# Patient Record
Sex: Female | Born: 1950 | Race: White | Hispanic: No | Marital: Married | Smoking: Current every day smoker
Health system: Southern US, Community
[De-identification: ages and names within clinical notes are randomized; demographics above are authoritative.]

## PROBLEM LIST (undated history)

## (undated) DIAGNOSIS — J449 Chronic obstructive pulmonary disease, unspecified: Secondary | ICD-10-CM

## (undated) DIAGNOSIS — K529 Noninfective gastroenteritis and colitis, unspecified: Secondary | ICD-10-CM

## (undated) DIAGNOSIS — K649 Unspecified hemorrhoids: Secondary | ICD-10-CM

## (undated) DIAGNOSIS — F319 Bipolar disorder, unspecified: Secondary | ICD-10-CM

## (undated) DIAGNOSIS — K219 Gastro-esophageal reflux disease without esophagitis: Secondary | ICD-10-CM

## (undated) DIAGNOSIS — F419 Anxiety disorder, unspecified: Secondary | ICD-10-CM

## (undated) DIAGNOSIS — R569 Unspecified convulsions: Secondary | ICD-10-CM

## (undated) DIAGNOSIS — I639 Cerebral infarction, unspecified: Secondary | ICD-10-CM

## (undated) DIAGNOSIS — K259 Gastric ulcer, unspecified as acute or chronic, without hemorrhage or perforation: Secondary | ICD-10-CM

## (undated) DIAGNOSIS — K589 Irritable bowel syndrome without diarrhea: Secondary | ICD-10-CM

## (undated) DIAGNOSIS — F1011 Alcohol abuse, in remission: Secondary | ICD-10-CM

## (undated) DIAGNOSIS — M1990 Unspecified osteoarthritis, unspecified site: Secondary | ICD-10-CM

## (undated) HISTORY — PX: EYE SURGERY: SHX253

## (undated) HISTORY — PX: CHOLECYSTECTOMY: SHX55

## (undated) HISTORY — PX: ABDOMINAL HYSTERECTOMY: SHX81

---

## 2001-08-23 ENCOUNTER — Encounter: Payer: Self-pay | Admitting: Emergency Medicine

## 2001-08-23 ENCOUNTER — Emergency Department (HOSPITAL_COMMUNITY): Admission: EM | Admit: 2001-08-23 | Discharge: 2001-08-23 | Payer: Self-pay | Admitting: Emergency Medicine

## 2001-09-07 ENCOUNTER — Encounter: Payer: Self-pay | Admitting: *Deleted

## 2001-09-07 ENCOUNTER — Emergency Department (HOSPITAL_COMMUNITY): Admission: EM | Admit: 2001-09-07 | Discharge: 2001-09-07 | Payer: Self-pay | Admitting: *Deleted

## 2002-05-15 ENCOUNTER — Encounter: Payer: Self-pay | Admitting: Internal Medicine

## 2002-05-15 ENCOUNTER — Emergency Department (HOSPITAL_COMMUNITY): Admission: EM | Admit: 2002-05-15 | Discharge: 2002-05-15 | Payer: Self-pay | Admitting: Internal Medicine

## 2002-06-12 ENCOUNTER — Ambulatory Visit (HOSPITAL_COMMUNITY): Admission: RE | Admit: 2002-06-12 | Discharge: 2002-06-12 | Payer: Self-pay | Admitting: Internal Medicine

## 2002-06-12 ENCOUNTER — Encounter: Payer: Self-pay | Admitting: Internal Medicine

## 2003-03-17 ENCOUNTER — Encounter: Payer: Self-pay | Admitting: Emergency Medicine

## 2003-03-17 ENCOUNTER — Emergency Department (HOSPITAL_COMMUNITY): Admission: EM | Admit: 2003-03-17 | Discharge: 2003-03-17 | Payer: Self-pay | Admitting: Emergency Medicine

## 2003-06-03 ENCOUNTER — Emergency Department (HOSPITAL_COMMUNITY): Admission: EM | Admit: 2003-06-03 | Discharge: 2003-06-03 | Payer: Self-pay | Admitting: Emergency Medicine

## 2003-06-03 ENCOUNTER — Encounter: Payer: Self-pay | Admitting: Emergency Medicine

## 2003-06-28 ENCOUNTER — Emergency Department (HOSPITAL_COMMUNITY): Admission: EM | Admit: 2003-06-28 | Discharge: 2003-06-28 | Payer: Self-pay | Admitting: Emergency Medicine

## 2003-06-28 ENCOUNTER — Encounter: Payer: Self-pay | Admitting: Emergency Medicine

## 2004-09-03 ENCOUNTER — Emergency Department (HOSPITAL_COMMUNITY): Admission: EM | Admit: 2004-09-03 | Discharge: 2004-09-03 | Payer: Self-pay | Admitting: Emergency Medicine

## 2005-11-07 ENCOUNTER — Emergency Department (HOSPITAL_COMMUNITY): Admission: EM | Admit: 2005-11-07 | Discharge: 2005-11-07 | Payer: Self-pay | Admitting: Emergency Medicine

## 2006-01-12 ENCOUNTER — Emergency Department (HOSPITAL_COMMUNITY): Admission: EM | Admit: 2006-01-12 | Discharge: 2006-01-12 | Payer: Self-pay | Admitting: Emergency Medicine

## 2006-01-14 ENCOUNTER — Emergency Department (HOSPITAL_COMMUNITY): Admission: EM | Admit: 2006-01-14 | Discharge: 2006-01-14 | Payer: Self-pay | Admitting: Emergency Medicine

## 2007-08-20 ENCOUNTER — Emergency Department (HOSPITAL_COMMUNITY): Admission: EM | Admit: 2007-08-20 | Discharge: 2007-08-20 | Payer: Self-pay | Admitting: Emergency Medicine

## 2008-01-11 ENCOUNTER — Emergency Department (HOSPITAL_COMMUNITY): Admission: EM | Admit: 2008-01-11 | Discharge: 2008-01-11 | Payer: Self-pay | Admitting: Emergency Medicine

## 2008-01-21 ENCOUNTER — Emergency Department (HOSPITAL_COMMUNITY): Admission: EM | Admit: 2008-01-21 | Discharge: 2008-01-21 | Payer: Self-pay | Admitting: Emergency Medicine

## 2008-03-09 ENCOUNTER — Ambulatory Visit (HOSPITAL_COMMUNITY): Admission: RE | Admit: 2008-03-09 | Discharge: 2008-03-09 | Payer: Self-pay | Admitting: Ophthalmology

## 2008-03-18 ENCOUNTER — Emergency Department (HOSPITAL_COMMUNITY): Admission: EM | Admit: 2008-03-18 | Discharge: 2008-03-18 | Payer: Self-pay | Admitting: Emergency Medicine

## 2008-04-20 ENCOUNTER — Ambulatory Visit (HOSPITAL_COMMUNITY): Admission: RE | Admit: 2008-04-20 | Discharge: 2008-04-20 | Payer: Self-pay | Admitting: Ophthalmology

## 2008-04-26 ENCOUNTER — Observation Stay (HOSPITAL_COMMUNITY): Admission: EM | Admit: 2008-04-26 | Discharge: 2008-04-28 | Payer: Self-pay | Admitting: Emergency Medicine

## 2008-05-08 ENCOUNTER — Emergency Department (HOSPITAL_COMMUNITY): Admission: EM | Admit: 2008-05-08 | Discharge: 2008-05-08 | Payer: Self-pay | Admitting: Emergency Medicine

## 2008-05-17 ENCOUNTER — Emergency Department (HOSPITAL_COMMUNITY): Admission: EM | Admit: 2008-05-17 | Discharge: 2008-05-17 | Payer: Self-pay | Admitting: Emergency Medicine

## 2008-07-25 ENCOUNTER — Emergency Department (HOSPITAL_COMMUNITY): Admission: EM | Admit: 2008-07-25 | Discharge: 2008-07-25 | Payer: Self-pay | Admitting: Emergency Medicine

## 2008-07-26 ENCOUNTER — Emergency Department (HOSPITAL_COMMUNITY): Admission: EM | Admit: 2008-07-26 | Discharge: 2008-07-26 | Payer: Self-pay | Admitting: Emergency Medicine

## 2008-08-09 ENCOUNTER — Other Ambulatory Visit: Payer: Self-pay

## 2008-08-09 ENCOUNTER — Ambulatory Visit: Payer: Self-pay | Admitting: *Deleted

## 2008-08-09 ENCOUNTER — Inpatient Hospital Stay (HOSPITAL_COMMUNITY): Admission: AD | Admit: 2008-08-09 | Discharge: 2008-08-11 | Payer: Self-pay | Admitting: *Deleted

## 2008-08-09 ENCOUNTER — Other Ambulatory Visit: Payer: Self-pay | Admitting: Emergency Medicine

## 2008-09-30 ENCOUNTER — Emergency Department (HOSPITAL_COMMUNITY): Admission: EM | Admit: 2008-09-30 | Discharge: 2008-09-30 | Payer: Self-pay | Admitting: Emergency Medicine

## 2008-11-09 ENCOUNTER — Emergency Department (HOSPITAL_COMMUNITY): Admission: EM | Admit: 2008-11-09 | Discharge: 2008-11-09 | Payer: Self-pay | Admitting: Emergency Medicine

## 2008-11-13 ENCOUNTER — Emergency Department (HOSPITAL_COMMUNITY): Admission: EM | Admit: 2008-11-13 | Discharge: 2008-11-13 | Payer: Self-pay | Admitting: Emergency Medicine

## 2009-06-13 ENCOUNTER — Other Ambulatory Visit: Payer: Self-pay | Admitting: Emergency Medicine

## 2009-06-13 ENCOUNTER — Inpatient Hospital Stay (HOSPITAL_COMMUNITY): Admission: AD | Admit: 2009-06-13 | Discharge: 2009-06-16 | Payer: Self-pay | Admitting: *Deleted

## 2009-06-13 ENCOUNTER — Ambulatory Visit: Payer: Self-pay | Admitting: *Deleted

## 2009-07-25 ENCOUNTER — Emergency Department (HOSPITAL_COMMUNITY): Admission: EM | Admit: 2009-07-25 | Discharge: 2009-07-25 | Payer: Self-pay | Admitting: Emergency Medicine

## 2010-03-25 ENCOUNTER — Emergency Department (HOSPITAL_COMMUNITY): Admission: EM | Admit: 2010-03-25 | Discharge: 2010-03-25 | Payer: Self-pay | Admitting: Emergency Medicine

## 2010-04-08 ENCOUNTER — Emergency Department (HOSPITAL_COMMUNITY): Admission: EM | Admit: 2010-04-08 | Discharge: 2010-04-08 | Payer: Self-pay | Admitting: Emergency Medicine

## 2010-04-20 ENCOUNTER — Emergency Department (HOSPITAL_COMMUNITY): Admission: EM | Admit: 2010-04-20 | Discharge: 2010-04-20 | Payer: Self-pay | Admitting: Emergency Medicine

## 2010-05-24 ENCOUNTER — Encounter (INDEPENDENT_AMBULATORY_CARE_PROVIDER_SITE_OTHER): Payer: Self-pay | Admitting: *Deleted

## 2010-06-09 ENCOUNTER — Telehealth (INDEPENDENT_AMBULATORY_CARE_PROVIDER_SITE_OTHER): Payer: Self-pay

## 2010-06-13 ENCOUNTER — Ambulatory Visit: Payer: Self-pay | Admitting: Gastroenterology

## 2010-06-13 DIAGNOSIS — K921 Melena: Secondary | ICD-10-CM

## 2010-06-13 DIAGNOSIS — R634 Abnormal weight loss: Secondary | ICD-10-CM | POA: Insufficient documentation

## 2010-06-13 DIAGNOSIS — K589 Irritable bowel syndrome without diarrhea: Secondary | ICD-10-CM

## 2010-06-13 DIAGNOSIS — K3189 Other diseases of stomach and duodenum: Secondary | ICD-10-CM

## 2010-06-13 DIAGNOSIS — R1013 Epigastric pain: Secondary | ICD-10-CM

## 2010-06-13 DIAGNOSIS — K219 Gastro-esophageal reflux disease without esophagitis: Secondary | ICD-10-CM | POA: Insufficient documentation

## 2010-06-13 DIAGNOSIS — R1084 Generalized abdominal pain: Secondary | ICD-10-CM | POA: Insufficient documentation

## 2010-06-23 ENCOUNTER — Encounter: Payer: Self-pay | Admitting: Gastroenterology

## 2010-06-25 ENCOUNTER — Emergency Department (HOSPITAL_COMMUNITY): Admission: EM | Admit: 2010-06-25 | Discharge: 2010-06-25 | Payer: Self-pay | Admitting: Emergency Medicine

## 2010-06-27 ENCOUNTER — Ambulatory Visit (HOSPITAL_COMMUNITY): Admission: RE | Admit: 2010-06-27 | Discharge: 2010-06-27 | Payer: Self-pay | Admitting: Gastroenterology

## 2010-06-27 ENCOUNTER — Ambulatory Visit: Payer: Self-pay | Admitting: Gastroenterology

## 2010-06-29 ENCOUNTER — Telehealth (INDEPENDENT_AMBULATORY_CARE_PROVIDER_SITE_OTHER): Payer: Self-pay

## 2010-07-06 ENCOUNTER — Telehealth (INDEPENDENT_AMBULATORY_CARE_PROVIDER_SITE_OTHER): Payer: Self-pay

## 2010-07-12 ENCOUNTER — Telehealth (INDEPENDENT_AMBULATORY_CARE_PROVIDER_SITE_OTHER): Payer: Self-pay

## 2010-07-17 ENCOUNTER — Telehealth (INDEPENDENT_AMBULATORY_CARE_PROVIDER_SITE_OTHER): Payer: Self-pay

## 2010-08-10 ENCOUNTER — Encounter (INDEPENDENT_AMBULATORY_CARE_PROVIDER_SITE_OTHER): Payer: Self-pay | Admitting: *Deleted

## 2010-08-14 ENCOUNTER — Emergency Department (HOSPITAL_COMMUNITY): Admission: EM | Admit: 2010-08-14 | Discharge: 2010-08-14 | Payer: Self-pay | Admitting: Emergency Medicine

## 2010-09-10 ENCOUNTER — Observation Stay (HOSPITAL_COMMUNITY): Admission: EM | Admit: 2010-09-10 | Discharge: 2010-09-11 | Payer: Self-pay | Admitting: Emergency Medicine

## 2010-11-18 ENCOUNTER — Encounter: Payer: Self-pay | Admitting: Family Medicine

## 2010-11-20 ENCOUNTER — Encounter: Payer: Self-pay | Admitting: Emergency Medicine

## 2010-11-30 NOTE — Progress Notes (Signed)
Summary: phone note/ can't wait til appt  Phone Note Call from Patient   Caller: Patient Summary of Call: Pt called and said she has appt here with Dr. Darrick Penna on 06/19/2010. She said she has been to the ER several times. She is having n/v and she said it felt like her insides were going to come out, said she has lost 20 lbs in the last several months. Please advise! Initial call taken by: Cloria Spring LPN,  June 09, 2010 9:44 AM     Appended Document: phone note/ can't wait til appt Please call pt. I reviewed her records from 2010 to the present. Her labs and Xrays have been normal. She may be scheduled at 0830 on 8/16 for 30 minutes, dx: nausea and vomiting. In the meantime, she needs to stop smoking pot and use Phenergan 25 mg q4-6h as needed for nausea and vomiting, #30, rfx0. She needs to continue her OMP 30 minutes before her first meal.  Appended Document: phone note/ can't wait til appt Informed pt of the above. Called Rx to [facility] @ [facility]. Pt aware of appt here on 06/13/2010 @ 8:30 AM.  Appended Document: phone note/ can't wait til appt Apt can for 8/22 and rescheduled for 8/16 @ 8:30 with Dr. Darrick Penna (per Dr. Darrick Penna). Erskine Squibb

## 2010-11-30 NOTE — Progress Notes (Signed)
Summary: ? about diet  Phone Note Call from Patient Call back at [facility] Phone [phone]   Caller: Patient Summary of Call: pt called-  stated she was feeling alot better and wants to know when she can eat regular food again. please advise Initial call taken by: Hendricks Limes LPN,  July 12, 2010 10:38 AM     Appended Document: ? about diet Pleae all pt. She may have a lactose free/high fiber diet. No more than two slices of bread daily.  Appended Document: ? about diet pt aware

## 2010-11-30 NOTE — Letter (Signed)
Summary: Recall Office Visit  [facility]  [address]   [place], [place] [address]   Phone: [phone]  Fax: [phone]      August 10, 2010   [name] [address] [place], [place]  [address] 16-Jul-1951   Dear [name],   According to our records, it is time for you to schedule a follow-up office visit with [place].   At your convenience, please call [phone] to schedule an office visit. If you have any questions, concerns, or feel that this letter is in error, we would appreciate your call.   Sincerely,    Diana Eves  [facility] Ph: [phone]   Fax: [phone]  Appended Document: Recall Office Visit [name] Dob [birth year]-Called to remind her of her appointment in the morning and she said she would not be coming and would no longer be seen here. I asked what was the problem and she says she feels like she was lied to by the physician the first time she came in and she no longer wants to be seen. LAW  PLEASE NOTIFY PCP THAT PT NO LONGER IS A PT OF [facility] PER HER REQUEST.  Appended Document: Recall Office Visit Dr. Sudie Bailey was notified

## 2010-11-30 NOTE — Letter (Signed)
Summary: TCS/EGD ORDER  TCS/EGD ORDER   Imported By: Rosine Beat 06/23/2010 14:25:55  _____________________________________________________________________  External Attachment:    Type:   Image     Comment:   External Document

## 2010-11-30 NOTE — Progress Notes (Signed)
Summary: pt thinks she is getting pleurysy, requested antibiotics  Phone Note Call from Patient   Caller: Patient Summary of Call: Pt called and said she has cold in her ribs and she is sure it will end up being pleursy. She requested antibiotics and i told her we do not call in the antibiotics for that, she should see her PCP. Initial call taken by: Cloria Spring LPN,  July 17, 2010 11:41 AM     Appended Document: pt thinks she is getting pleurysy, requested antibiotics Please call pt. She needs to see her PCP.  Appended Document: pt thinks she is getting pleurysy, requested antibiotics Pt informed.
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
## 2010-11-30 NOTE — Letter (Signed)
Summary: Unable to Reach, Consult Scheduled  [facility]  [address]   [place], [place] [address]   Phone: [phone]  Fax: [phone]    05/24/2010  LEVIE OWENSBY [address] [place], [place]  [address] [birth year]   Dear Ms. Dabbs,   We have been unable to reach you by phone.  Please contact our office with an updated phone number.  At the recommendation of DR Sudie Bailey  we have been asked to schedule you a consult with DR FIELDS for EPIGASTRIC PAIN AND POSITIVE HEMOCULTS.  Please call our office at [phone].     Thank you,    Diana Eves  [facility] R. Roetta Sessions, M.D.    Jonette Eva, M.D. Lorenza Burton, FNP-BC    Tana Coast, PA-C Phone: [phone]    Fax: [phone]

## 2010-11-30 NOTE — Progress Notes (Signed)
Summary: pain  Phone Note Call from Patient Call back at [facility] Phone [phone]   Caller: Patient Summary of Call: pt called- left voicemail- stated she felt the entire procedure and she wants something called in to [facility] for pain. please advise Initial call taken by: Hendricks Limes LPN,  June 29, 2010 10:11 AM     Appended Document: pain Called pt to discuss complaint and pah. LVM-call me at [phone]. Pt needs PROPOFOL WITH NEXT ENDOSCOPY.  Appended Document: pain Spoke with pt and pt c/o having abd pain but better than 3 days ago. Continue with the Bentyl and Imipramine. No cramping, nausea, vomiting, fever or chills. Discomfort around the stomach. For the next 2-3 days: Full liquids until discomfort resolve. Recommended Ensure or Boost. May have non-dairy creamer. Call Tues if Sx not improved. Go to ED for worsening abd pain. Use Tylenol XS 2 by mouth q6h for 3 days and use Ibuprofen OTC 2 by mouth as needed for additional pain relief.  MAIL FULL LIQUID DIET HO TODAY. Pt unable to pick up ppwk.  Appended Document: pain HO mailed to pt

## 2010-11-30 NOTE — Progress Notes (Signed)
Summary: phone call/ ref to eating bread  Phone Note Call from Patient   Caller: Patient Summary of Call: Pt said she was given a couple of diets, she doesn't have them with her when calling, but one of them said to not eat any bread. She loves her bread and wants to know if it is OK to have some bread, please advise! Initial call taken by: Cloria Spring LPN,  July 06, 2010 9:30 AM     Appended Document: phone call/ ref to eating bread Please call pt. She may have two slices of bread daily.  Appended Document: phone call/ ref to eating bread Pt informed.

## 2011-01-05 ENCOUNTER — Encounter: Payer: Self-pay | Admitting: Gastroenterology

## 2011-01-09 LAB — DIFFERENTIAL
Lymphocytes Relative: 30 % (ref 12–46)
Monocytes Absolute: 1.1 10*3/uL — ABNORMAL HIGH (ref 0.1–1.0)
Monocytes Relative: 10 % (ref 3–12)
Neutro Abs: 6.5 10*3/uL (ref 1.7–7.7)

## 2011-01-09 LAB — URINE MICROSCOPIC-ADD ON

## 2011-01-09 LAB — CARDIAC PANEL(CRET KIN+CKTOT+MB+TROPI)
Relative Index: INVALID (ref 0.0–2.5)
Relative Index: INVALID (ref 0.0–2.5)
Troponin I: 0.02 ng/mL (ref 0.00–0.06)

## 2011-01-09 LAB — POCT CARDIAC MARKERS
CKMB, poc: <1 ng/mL — ABNORMAL LOW (ref 1.0–8.0)
Myoglobin, poc: 49.5 ng/mL (ref 12–200)
Troponin i, poc: <0.05 ng/mL (ref 0.00–0.09)
Troponin i, poc: <0.05 ng/mL (ref 0.00–0.09)

## 2011-01-09 LAB — BASIC METABOLIC PANEL
BUN: 4 mg/dL — ABNORMAL LOW (ref 6–23)
CO2: 22 mEq/L (ref 19–32)
Creatinine, Ser: 0.78 mg/dL (ref 0.4–1.2)
GFR calc non Af Amer: >60 mL/min (ref >60)
Sodium: 141 mEq/L (ref 135–145)

## 2011-01-09 LAB — URINALYSIS, ROUTINE W REFLEX MICROSCOPIC
Bilirubin Urine: NEGATIVE
Glucose, UA: NEGATIVE mg/dL
Leukocytes, UA: NEGATIVE
Protein, ur: NEGATIVE mg/dL
Urobilinogen, UA: 0.2 mg/dL (ref 0.0–1.0)
pH: 7.5 (ref 5.0–8.0)

## 2011-01-09 LAB — CBC
HCT: 35.3 % — ABNORMAL LOW (ref 36.0–46.0)
Hemoglobin: 12.2 g/dL (ref 12.0–15.0)
MCHC: 34.5 g/dL (ref 30.0–36.0)
MCV: 93.4 fL (ref 78.0–100.0)
Platelets: 279 10*3/uL (ref 150–400)
RDW: 13.8 % (ref 11.5–15.5)

## 2011-01-09 LAB — RAPID URINE DRUG SCREEN, HOSP PERFORMED: Barbiturates: NOT DETECTED

## 2011-01-09 NOTE — Medication Information (Signed)
Summary: DICYCLOMINE 10MG   DICYCLOMINE 10MG    Imported By: Rexene Alberts 01/05/2011 12:03:11  _____________________________________________________________________  External Attachment:    Type:   Image     Comment:   External Document  Appended Document: DICYCLOMINE 10MG  addressed

## 2011-01-11 LAB — DIFFERENTIAL
Basophils Absolute: 0 10*3/uL (ref 0.0–0.1)
Basophils Relative: 1 % (ref 0–1)
Eosinophils Relative: 2 % (ref 0–5)
Monocytes Absolute: 0.6 10*3/uL (ref 0.1–1.0)
Neutro Abs: 3.5 10*3/uL (ref 1.7–7.7)

## 2011-01-11 LAB — COMPREHENSIVE METABOLIC PANEL
Albumin: 4 g/dL (ref 3.5–5.2)
Alkaline Phosphatase: 197 U/L — ABNORMAL HIGH (ref 39–117)
BUN: 5 mg/dL — ABNORMAL LOW (ref 6–23)
Chloride: 105 mEq/L (ref 96–112)
Potassium: 3.3 mEq/L — ABNORMAL LOW (ref 3.5–5.1)
Total Bilirubin: 0.5 mg/dL (ref 0.3–1.2)

## 2011-01-11 LAB — CBC
HCT: 38.5 % (ref 36.0–46.0)
Hemoglobin: 13.1 g/dL (ref 12.0–15.0)
MCV: 92.1 fL (ref 78.0–100.0)
Platelets: 273 10*3/uL (ref 150–400)
RBC: 4.18 MIL/uL (ref 3.87–5.11)
WBC: 6.2 10*3/uL (ref 4.0–10.5)

## 2011-01-14 LAB — CBC
HCT: 37.8 % (ref 36.0–46.0)
MCV: 91.6 fL (ref 78.0–100.0)
RBC: 4.13 MIL/uL (ref 3.87–5.11)
WBC: 9.4 10*3/uL (ref 4.0–10.5)

## 2011-01-14 LAB — DIFFERENTIAL
Eosinophils Relative: 2 % (ref 0–5)
Lymphocytes Relative: 25 % (ref 12–46)
Lymphs Abs: 2.3 10*3/uL (ref 0.7–4.0)
Monocytes Absolute: 0.8 10*3/uL (ref 0.1–1.0)

## 2011-01-14 LAB — HEPATIC FUNCTION PANEL
ALT: 15 U/L (ref 0–35)
AST: 18 U/L (ref 0–37)
Alkaline Phosphatase: 197 U/L — ABNORMAL HIGH (ref 39–117)
Total Protein: 7.2 g/dL (ref 6.0–8.3)

## 2011-01-14 LAB — URINALYSIS, ROUTINE W REFLEX MICROSCOPIC
Bilirubin Urine: NEGATIVE
Glucose, UA: NEGATIVE mg/dL
Ketones, ur: NEGATIVE mg/dL
Protein, ur: NEGATIVE mg/dL

## 2011-01-14 LAB — BASIC METABOLIC PANEL
Chloride: 107 mEq/L (ref 96–112)
GFR calc Af Amer: >60 mL/min (ref >60)
Potassium: 3.9 mEq/L (ref 3.5–5.1)
Sodium: 139 mEq/L (ref 135–145)

## 2011-01-14 LAB — URINE MICROSCOPIC-ADD ON

## 2011-01-15 LAB — POCT CARDIAC MARKERS
CKMB, poc: 1.4 ng/mL (ref 1.0–8.0)
Troponin i, poc: <0.05 ng/mL (ref 0.00–0.09)

## 2011-01-15 LAB — CBC
HCT: 34.7 % — ABNORMAL LOW (ref 36.0–46.0)
MCHC: 34.3 g/dL (ref 30.0–36.0)
MCV: 91.7 fL (ref 78.0–100.0)
Platelets: 252 10*3/uL (ref 150–400)
RDW: 13.4 % (ref 11.5–15.5)

## 2011-01-15 LAB — BASIC METABOLIC PANEL
BUN: 3 mg/dL — ABNORMAL LOW (ref 6–23)
CO2: 26 mEq/L (ref 19–32)
GFR calc non Af Amer: >60 mL/min (ref >60)
Glucose, Bld: 101 mg/dL — ABNORMAL HIGH (ref 70–99)
Potassium: 3.6 mEq/L (ref 3.5–5.1)

## 2011-01-15 LAB — DIFFERENTIAL
Basophils Absolute: 0 10*3/uL (ref 0.0–0.1)
Basophils Relative: 1 % (ref 0–1)
Eosinophils Absolute: 0.2 10*3/uL (ref 0.0–0.7)
Eosinophils Relative: 4 % (ref 0–5)
Lymphocytes Relative: 34 % (ref 12–46)
Monocytes Absolute: 0.7 10*3/uL (ref 0.1–1.0)

## 2011-02-03 LAB — URINALYSIS, ROUTINE W REFLEX MICROSCOPIC
Leukocytes, UA: NEGATIVE
Nitrite: NEGATIVE
Specific Gravity, Urine: <1.005 — ABNORMAL LOW (ref 1.005–1.030)
Urobilinogen, UA: 0.2 mg/dL (ref 0.0–1.0)
pH: 6 (ref 5.0–8.0)

## 2011-02-03 LAB — DIFFERENTIAL
Lymphs Abs: 2.2 10*3/uL (ref 0.7–4.0)
Monocytes Absolute: 0.7 10*3/uL (ref 0.1–1.0)
Monocytes Relative: 9 % (ref 3–12)
Neutro Abs: 4.6 10*3/uL (ref 1.7–7.7)
Neutrophils Relative %: 60 % (ref 43–77)

## 2011-02-03 LAB — RAPID URINE DRUG SCREEN, HOSP PERFORMED
Barbiturates: NOT DETECTED
Benzodiazepines: POSITIVE — AB

## 2011-02-03 LAB — URINE MICROSCOPIC-ADD ON

## 2011-02-03 LAB — BASIC METABOLIC PANEL
CO2: 27 mEq/L (ref 19–32)
Calcium: 8.8 mg/dL (ref 8.4–10.5)
Chloride: 106 mEq/L (ref 96–112)
Creatinine, Ser: 0.91 mg/dL (ref 0.4–1.2)
GFR calc Af Amer: >60 mL/min (ref >60)
Sodium: 139 mEq/L (ref 135–145)

## 2011-02-03 LAB — CBC
Hemoglobin: 12.7 g/dL (ref 12.0–15.0)
MCHC: 35.4 g/dL (ref 30.0–36.0)
RBC: 3.89 MIL/uL (ref 3.87–5.11)
WBC: 7.7 10*3/uL (ref 4.0–10.5)

## 2011-02-03 LAB — ETHANOL: Alcohol, Ethyl (B): <5 mg/dL (ref 0–10)

## 2011-02-03 LAB — LITHIUM LEVEL: Lithium Lvl: <0.25 mEq/L — ABNORMAL LOW (ref 0.80–1.40)

## 2011-02-12 LAB — DIFFERENTIAL
Basophils Relative: 2 % — ABNORMAL HIGH (ref 0–1)
Basophils Relative: 1 % (ref 0–1)
Eosinophils Absolute: 0.3 10*3/uL (ref 0.0–0.7)
Eosinophils Absolute: 0.1 10*3/uL (ref 0.0–0.7)
Eosinophils Relative: 1 % (ref 0–5)
Lymphs Abs: 2.6 10*3/uL (ref 0.7–4.0)
Monocytes Absolute: 0.9 10*3/uL (ref 0.1–1.0)
Monocytes Relative: 12 % (ref 3–12)
Monocytes Relative: 9 % (ref 3–12)
Neutrophils Relative %: 39 % — ABNORMAL LOW (ref 43–77)
Neutrophils Relative %: 64 % (ref 43–77)

## 2011-02-12 LAB — URINALYSIS, ROUTINE W REFLEX MICROSCOPIC
Ketones, ur: NEGATIVE mg/dL
Protein, ur: NEGATIVE mg/dL
Urobilinogen, UA: 0.2 mg/dL (ref 0.0–1.0)

## 2011-02-12 LAB — CBC
HCT: 41.3 % (ref 36.0–46.0)
Hemoglobin: 14 g/dL (ref 12.0–15.0)
MCHC: 34.3 g/dL (ref 30.0–36.0)
MCHC: 34 g/dL (ref 30.0–36.0)
MCV: 94.6 fL (ref 78.0–100.0)
MCV: 94.5 fL (ref 78.0–100.0)
Platelets: 258 10*3/uL (ref 150–400)
RBC: 4.37 MIL/uL (ref 3.87–5.11)

## 2011-02-12 LAB — BASIC METABOLIC PANEL
BUN: 5 mg/dL — ABNORMAL LOW (ref 6–23)
CO2: 23 mEq/L (ref 19–32)
CO2: 26 mEq/L (ref 19–32)
Chloride: 110 mEq/L (ref 96–112)
Chloride: 105 mEq/L (ref 96–112)
Creatinine, Ser: 0.73 mg/dL (ref 0.4–1.2)
Creatinine, Ser: 0.74 mg/dL (ref 0.4–1.2)
GFR calc Af Amer: >60 mL/min (ref >60)
Potassium: 3.7 mEq/L (ref 3.5–5.1)

## 2011-02-12 LAB — URINE MICROSCOPIC-ADD ON

## 2011-03-13 NOTE — Group Therapy Note (Signed)
[name], [name] NO.:  [id number]   MEDICAL RECORD NO.:  [id number]          PATIENT TYPE:  OBV   LOCATION:  A328                          FACILITY:  [facility]   PHYSICIAN:  Sabrina Singh, DO    DATE OF BIRTH:  May 05, 1951   DATE OF PROCEDURE:  04/27/2008  DATE OF DISCHARGE:                                 PROGRESS NOTE   The patient seen today stating that she feels much better.  She has been  seen by Dr. Gerilyn Jefferson.  We have a scheduled EEG for her tomorrow.  Dr.  Gerilyn Jefferson states that her seizure disorder is possibly due to Dilantin  toxicity, so we will go ahead with the EEG and restore her Dilantin  brand name and make it 200 mg b.i.d. and he wants her to follow up in 3  to 4 weeks for her EEG.  They can only do the EEG tomorrow, so  anticipate discharge then.   EXAM:  VITALS:  Temperature 98.2, pulse 70, respirations 16, blood  pressure 89/63.  The patient is a 60 year old Caucasian female who is more alert, well  developed, well nourished and in acute distress.  HEART:  Regular rate and rhythm.  LUNGS:  Clear to auscultation bilaterally.  ABDOMEN:  Soft, nontender.  EXTREMITIES:  Positive pulses.  No edema, ecchymosis or cyanosis.   LABS:  White count is 9.0, hemoglobin 11.9, hematocrit 34.2, and  platelets of 255,000.  Her sodium was 139, potassium 3.6, chloride is  107, CO2 27, glucose 120 and BUN 4, creatinine 0.69.   ASSESSMENT/PLAN:  1. Seizure disorder.  2. Dilantin toxicity.   PLAN:  Await EEG.  After that the patient can be discharged to home with  the final recommendations per neurology. She is to follow up with Dr.  Gerilyn Jefferson as well.      Sabrina Singh, DO  Electronically Signed     CB/MEDQ  D:  04/27/2008  T:  04/27/2008  Job:  [id number]

## 2011-03-13 NOTE — H&P (Signed)
NAMEMarland Jefferson  MIKERIA, Sabrina NO.:  [id number]   MEDICAL RECORD NO.:  [id number]          PATIENT TYPE:  IPS   LOCATION:  0301                          [facility]ILITY[facility]  [facility]   PHYSICIAN:  Jasmine Pang, M.D. DATE OF BIRTH:  09/15/1951   DATE OF ADMISSION:  08/09/2008  DATE OF DISCHARGE:                       PSYCHIATRIC ADMISSION ASSESSMENT   PATIENT IDENTIFICATION:  This is a 60 year old female voluntarily  admitted on August 09, 2008.   HISTORY OF PRESENT ILLNESS:  The patient reports history of anxiety, was  having suicidal thoughts, but states she does not want to herself, had  no specific plan.  The patient states she went to change her medicine  from Xanax to Klonopin.  She reports one of her stressors are that she  had lost her daughter in January 2009 from a methadone overdose.  Has  not been sleeping well, lost her appetite.  She has a past history of  alcohol use.  She reports seeing visual hallucinations, seeing her  daughter in a gown.   PAST PSYCHIATRIC HISTORY:  First admission to [facility].  Se[facility] Dr. Tawni Millers [facility].  Has been at [facility] twice before for  depression and states that she has been on all antidepressants.   SOCIAL HISTORY:  This is a 60 year old married female recently got  married.  States because of her with recent marriage, she has lost her  SSI check.  The patient lives in [place].   FAMILY HISTORY:  Unknown.   ALCOHOL AND DRUG HABITS:  The patient reports no alcohol use since 1995.   PRIMARY CARE Roanna Reaves:  Has no current primary care Acire Tang.   MEDICAL PROBLEMS:  Reports a history of seizures, last one being a year  ago.  Irritable bowel syndrome, ulcers and diabetes.   MEDICATIONS:  1. Xanax 1 mg q.i.d.  2. Dilantin 400 mg at bedtime.  3. Trazodone 100 mg at bedtime.  4. Multivitamin once daily.  5. Metformin 500 mg b.i.d.  6. Prevacid 30 mg daily.   DRUG ALLERGIES:  No known allergies.   PHYSICAL EXAMINATION:  GENERAL:  This is a 61 year old female.  She was  fully assessed at [facility].  VITAL SIGNS:  Temperature 98, 79 heart rate 20 respirations, blood  pressure is 112 or 67, 5 feet 5 inches tall, 143 pounds.  Her Dilantin  level was 12.5, BUN was 4.  CBC within normal limits and her urine drug  screen was urine drug screen was negative.   MENTAL STATUS EXAM:  The patient was cooperative.  She appears older  than her calendar years, somewhat unkempt, calm.  Speech is slow and  soft.  Mood is depressed and anxious.  The patient appears depressed  with moderate anxiety level.  Thought process are coherent and goal  directed.  Her thought content is within normal limits.  The patient did  endorse prior visual hallucinations, but none currently.  Cognitive  functioning is intact.  Memory appears to be good.  Judgment insight is  fair.   DIAGNOSES:  AXIS I:  Mood disorder, rule out benzoyl abuse.  AXIS II:  Deferred.  AXIS III:  Diabetes, seizure disorder ulcers.  AXIS IV:  Psychosocial problems, medical problems, possible problems  with economic issues.  AXIS V:  Current is 35-40.   PLAN:  Contract for safety.  Stabilize mood and thinking.  We will  resume Xanax on a p.r.n. basis.  Will initiate lithium as the patient  reports benefit from that prior.  We will consider family session with  her husband for support and concerns.  Patient will follow-up with Dr.  Dr. Tawni Millers.  The patient may benefit from some grief counseling.  Her  tentative length of stay at this time is 3-5 days.      Landry Corporal, N.P.      Jasmine Pang, M.D.  Electronically Signed    JO/MEDQ  D:  08/11/2008  T:  08/11/2008  Job:  [id number]

## 2011-03-13 NOTE — Discharge Summary (Signed)
NAMEMarland Jefferson  BELENDA, [name] NO.:  [id number]   MEDICAL RECORD NO.:  [id number]          PATIENT TYPE:  IPS   LOCATION:  0301                          [facility]   PHYSICIAN:  Jasmine Pang, M.D. DATE OF BIRTH:  07/05/1951   DATE OF ADMISSION:  08/09/2008  DATE OF DISCHARGE:  08/11/2008                               DISCHARGE SUMMARY   IDENTIFICATION:  This is a 60 year old married white female who was  admitted on a voluntary basis on August 09, 2008.   HISTORY OF PRESENT ILLNESS:  The patient has a history of anxiety.  She  wants to change medicine from Xanax to Klonopin.  She lost her daughter  in January 2009, to a methadone overdose.  She said suicidal ideation,  but does not want to hurt herself.  She is able to contract for safety.  She has had decreased sleep.  She has lost her appetite.  She reports  positive visual hallucinations.   PAST PSYCHIATRIC HISTORY:  This is the first [facility] admission.  She sees  Dr. Ernestene Kiel at the mental health center.  She has been at [facility] for  depression.  She has been on all antidepressants..   ALCOHOL AND DRUG HISTORY:  No alcohol since 1995.   MEDICAL PROBLEMS:  History of seizures, IBS, ulcers, and diabetes  mellitus   MEDICATIONS:  Xanax and Dilantin.   DRUG ALLERGIES:  No known drug allergies.   PHYSICAL FINDINGS:  There were no acute physical or medical problems  noted.  Her physical exam was done at [facility].   ADMISSION LABORATORY:  Dilantin level was 12.5.  BUN was 4.  CBC was  within normal limits.   HOSPITAL COURSE:  Upon admission, the patient was started on Ambien 10  mg p.o. q.h.s. p.r.n. insomnia.  She was also restarted on her home  medications of Prevacid 30 mg p.o. q.day, Xanax 1 mg p.o. q.i.d.,  Dilantin extended 400 mg p.o. q.h.s., trazodone 100 mg p.o. q.h.s.,  multivitamin 1 tablet p.o. daily, and metformin 500 mg p.o. b.i.d.  She  stated she had been on lithium in the past  and this has been helpful and  requested to go back on it.  She was placed on lithium 300 mg p.o.  b.i.d.  Xanax was decreased to 1 mg p.o. t.i.d.  In individual sessions,  the patient was friendly and cooperative.  She admitted to using  marijuana recently.  She denied other drug use.  She states she used to  use alcohol, but has given this up since 1995.  She attended unit  therapeutic groups and activities.  She talked about her recent marriage  a week and half ago and her subsequent loss of SSI due to this.  She was  quite distraught about this loss of income.  On August 11, 2008, mental  status had improved markedly from admission status.  There was still  some middle of the night awakening.  Appetite was good.  Mood was  euthymic.  Affect consistent with mood.  There was no suicidal or  homicidal ideation.  No thoughts of self-injurious behavior.  No  auditory or visual hallucinations.  No paranoia or delusions.  Thoughts  were logical and goal-directed.  Thought content no predominant theme.  Cognitive was grossly intact.  Insight fair.  Judgment fair.  Impulse  control good.  She was requesting discharge and it was felt she was safe  to go home.  She planned to return to [facility] in [place] for her  psychiatrist and counseling.   DISCHARGE DIAGNOSES:  Axis I:  Bipolar disorder, not otherwise  specified, and generalized anxiety disorder.  Axis II:  None.  Axis III:  Diabetes mellitus, seizure disorder, ulcers.  Axis IV:  Severe (problems related to social environment, economic  problem, burden of psychiatric illness, burden of medical problems).  Axis V:  Global assessment of functioning was 50 upon discharge.  GAF  was 35 upon admission.  GAF highest past year was 65.   DISCHARGE PLANS:  There was no activity level or dietary restrictions  other than her need to remain on a diabetic diet and low-sodium heart  healthy diet.   POSTHOSPITAL CARE PLANS:  The patient will go to  [facility] at [place] on  October 15th at 8:30 a.m.   DISCHARGE MEDICATIONS:  1. Trazodone 100 mg once at bedtime.  2. Dilantin 400 mg at bedtime.  3. Lithium carbonate 300 mg p.o. one b.i.d.  4. Prevacid 30 mg capsule once daily.  5. Metformin 500 mg tablet 1 twice daily with food.  6. Over-the-counter multivitamins.   She is to speak with her daughter about Xanax prescriptions.      Jasmine Pang, M.D.  Electronically Signed     BHS/MEDQ  D:  08/11/2008  T:  08/12/2008  Job:  [id number]

## 2011-03-13 NOTE — Procedures (Signed)
[name], [name]               ACCOUNT NO.:  [id number]   MEDICAL RECORD NO.:  [id number]          PATIENT TYPE:  OBV   LOCATION:  A328                          FACILITY:  [facility]   PHYSICIAN:  Kofi A. Gerilyn Pilgrim, M.D. DATE OF BIRTH:  1951-03-28   DATE OF PROCEDURE:  DATE OF DISCHARGE:  04/28/2008                              EEG INTERPRETATION   This is a 60 year old lady who presents with multiple seizures.   MEDICATIONS:  Xanax, Dilantin, Prevacid, Lovenox, Protonix, nicotine,  Atarax, Desyrel, Ventolin, Zofran, Ambien, Tylenol, and Ativan.   ANALYSIS:  A 16-channel recording is conducted for 29 minutes.  There is  a well-formed posterior rhythm of 8 Hz, which attenuates with eye  opening.  There is sleep activity observed, stage II sleep, K complexes,  and sleep spindles are observed.  There was no focal or lateralized  slowing.  There is also no epileptiform activity observed.   IMPRESSION:  This is a recording that shows mild generalized slowing,  however, there is no epileptiform activity.      Kofi A. Gerilyn Pilgrim, M.D.  Electronically Signed     KAD/MEDQ  D:  04/29/2008  T:  04/30/2008  Job:  [id number]

## 2011-03-13 NOTE — H&P (Signed)
NAMEHALCYON, HECK NO.:  [id number]   MEDICAL RECORD NO.:  [id number]          PATIENT TYPE:  EMS   LOCATION:  ED                            FACILITY:  [facility]   PHYSICIAN:  Dorris Singh, DO    DATE OF BIRTH:  1951-06-04   DATE OF ADMISSION:  04/26/2008  DATE OF DISCHARGE:  LH                              HISTORY & PHYSICAL   PRIMARY CARE PHYSICIAN:  Lia Hopping, MD   The patient is a 60 -year-old Caucasian female who presented to the  [facility] emergency room with complaint of seizure. History is obtained  by the patient who is now postictal. She stated the seizures occurred a  brief time ago. She feels like she has had two of them. They are  followed by a period of confusion. She was brought here by family friend  in a wheelchair and she denies any other complaints. The patient also  states that she has had a lot of family stress just recently where her  daughter just overdosed on methadone this week as well as her son who  currently is a fugative from the law so she states that this has been a  very stressful week for her.   PAST MEDICAL HISTORY:  1. Gastric ulcer which is active.  2. Anxiety.  3. Hernia.  4. Irritable bowel syndrome.  5. Seizure disorder.   PAST SURGICAL HISTORY:  1. Cholecystectomy.  2. Hysterectomy.   SOCIAL HISTORY:  She is a non-drinker, but she does smoke at least one  pack a day and she does use pot on a weekly basis.   ALLERGIES:  No known drug allergies.   CURRENT MEDICATIONS:  1. Prevacid 30 mg once a day.  2. Xanax 1 mg four times a day.  3. Dilantin 400 mg at bedtime.  4. Citalopram 20 mg once a day.  5. Trazodone 100 mg at bedtime.  6. Hydroxyline 25 mg four times a day.  7. Multivitamin.   REVIEW OF SYSTEMS:  Is as follows:  Negative for weight loss, appetite  changes for constitutional. Denies any flank pain or changes in vision.  EARS, NOSE, MOUTH, THROAT:  All negative. CARDIOVASCULAR: Negative for  chest pain, palpitations. RESPIRATORY:  Negative for cough, dyspnea or  wheezing. GI:  Negative for nausea, vomiting. GU:  Negative for dysuria,  urgency, or dribbling. MUSCULOSKELETAL: Negative for arthralgias,  arthritis, back pain.  SKIN:  Negative for prior rash or abrasions.  NEURO:  Positive for seizure, negative for headaches. Negative for  excessive thirst or cold.   PHYSICAL EXAMINATION:  VITAL SIGNS: Blood pressure 118/66, pulse rate  64, respirations 20, temperature 98.4. O2 saturation is 99%.  CONSTITUTIONAL:  The patient is a thin, 60 year old Caucasian female who  looks older than stated age who is in no acute distress. She is able to  answer to questions.  HEAD:  Normocephalic, atraumatic.  EYES:  EOMI.  PERRLA.  EARS:  Tympanic membranes are visualized bilaterally.  NOSE:  Turbinates are moist.  THROAT:  Dentition is poor. There are no teeth as well. No  erythema or  exudate.  NECK: Supple. No thyromegaly or lymphadenopathy.  HEART:  Regular rate and rhythm. No murmurs, rubs or gallops.  LUNGS:  Clear to auscultation bilaterally with minimal breath sounds in  the lower bases.  ABDOMEN: Soft and nontender, nondistended. No guarding or rebound. Bowel  sounds are present in all four quadrants.  EXTREMITIES: Full range of motion. No edema, cyanosis or ecchymosis.  Cranial nerves II-XII grossly intact. The patient is alert and oriented  x3.   Her basic metabolic panel:  Sodium 138, potassium 3.5, chloride 105,  carbon dioxide 26, glucose 94, BUN 4, creatinine 0.73, dilantin level is  39.9 which is high. Alcohol level is less than 5. White count is 8.2,  hemoglobin is 13.3, hematocrit 38, platelet count is 288.   ASSESSMENT/PLAN:  1. 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  2. Elevated dilantin levels.  3. Tobacco abuse.  4. Cannabis abuse.   PLAN:  Will be to admit the patient to [facility] for observation and  will consult neurology and also order and EEG. Will put the  patient on  home medications, but will hold dilantin. Will also do seizure  precautions and neuro checks as well. Will obtain morning labs and based  on the patient will plan to discharge her within 24-48 hours.      Dorris Singh, DO  Electronically Signed     CB/MEDQ  D:  04/26/2008  T:  04/26/2008  Job:  [id number]   cc:   Lia Hopping  Fax: [phone]

## 2011-03-13 NOTE — Group Therapy Note (Signed)
[name], [name] NO.:  [id number]   MEDICAL RECORD NO.:  [id number]          PATIENT TYPE:  OBV   LOCATION:  A328                          FACILITY:  [facility]   PHYSICIAN:  Lucita Ferrara, MD         DATE OF BIRTH:  September 04, 1951   DATE OF PROCEDURE:  DATE OF DISCHARGE:                                 PROGRESS NOTE   HISTORY:  The patient examined by bedside today.  Full history and  physical examination has been reviewed.  The patient had seizures on  subtherapeutic Dilantin.  She presented back on April 26, 2008.  She also  has history of cannabis abuse and tobacco abuse.  She has increased  psychosocial stressors.  She also has a history of alcohol withdrawal  versus alcohol induced seizures.  She was admitted and EEG is still  pending.   PHYSICAL EXAMINATION:  VITAL SIGNS:  Temperature 98.4, respirations 20,  pulse 76, blood pressure 130/69, pulse ox 98%.  HEENT:  Normocephalic, atraumatic.  CARDIOVASCULAR:  S1 and S2, regular rate and rhythm.  ABDOMEN:  Soft, nontender, nondistended, positive bowel sounds.  LUNGS:  Clear to auscultation bilaterally.  No rhonchi, rales or  wheezes.  EXTREMITIES:  No clubbing, cyanosis or edema.  NEURO:  Patient is alert and oriented x3.  Cranial nerves II-XII are  grossly intact.   ASSESSMENT:  1. Seizures on subtherapeutic Dilantin.  2. 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  3. Encephalopathy likely secondary to Dilantin toxicity.   PLAN:  Per recommendations from neurology, await EEG today.  Restart  Dilantin tomorrow per neuro recommendations.  The rest of the plans are  dependent on her progress and consultant recommendations.      Lucita Ferrara, MD  Electronically Signed     RR/MEDQ  D:  04/28/2008  T:  04/28/2008  Job:  [id number]

## 2011-03-13 NOTE — Consult Note (Signed)
[name], [name]               ACCOUNT NO.:  [id number]   MEDICAL RECORD NO.:  [id number]          PATIENT TYPE:  OBV   LOCATION:  A328                          FACILITY:  [facility]   PHYSICIAN:  Kofi A. Gerilyn Pilgrim, M.D. DATE OF BIRTH:  03/07/1951   DATE OF CONSULTATION:  DATE OF DISCHARGE:                                 CONSULTATION   REASON FOR CONSULTATION:  Seizures.   This is a 60 year old white female who apparently had a seizure  yesterday.  She was noted to be postictal.  She is somewhat amnestic as  to why she was in the hospital and actually these sequential events lead  her to come to the hospital.  It appeared that she may have weakness,  seizure, however, was postictal and confused, was seen and evaluated in  the emergency room.  She was admitted after having the dilantin level of  39.  The patient was followed by Dr. Olena Leatherwood.  She has recently seen  [facility] for apparently major depression and was  there for a while.  She apparently had some increased psychosocial  stressors related to her daughter who apparently was overdosed some  methadone overdose and a son who is in trouble with the law.  The  patient reports that she started to have seizure in 1995, related to  alcohol abuse, but has not been consuming alcohol since that time.  She  reports having infrequent seizure, the last seizure was apparently few  years ago.  She takes the branded medication.  She is actually taking 5  tabs a day, 2 in the morning and 3 at bedtime.   PAST MEDICAL HISTORY:  Gastric ulcer, anxiety disorder, irritable bowel  syndrome, seizure disorder, alcoholism, and hernia.   PAST SURGICAL HISTORY:  Cholecystectomy and hysterectomy.   SOCIAL HISTORY:  She does smoke a pack of cigarette per day, uses pot  occasionally about once a week.  No alcohol abuse and stated the last  was in 1995.   ALLERGIES:  Not known.   ADMISSION MEDICATIONS:  1. Prevacid 30 mg once a day.  2. Xanax 1 mg four times a day.  3. Dilantin branded medication 250 mg in the morning, 300 mg at      bedtime.  4. Celexa 20 mg a day.  5. Trazodone 100 mg at bedtime.  6. Hydroxyzine 25 mg four times a day.  7. Multivitamin.   REVIEW OF SYSTEMS:  Stated in the history of present illness and  unchanged from Dr. Elige Radon has done on April 26, 2008.   PHYSICAL EXAMINATION:  GENERAL:  Shows a thin lady who appears about 60  years old of her stated age.  VITAL SIGNS:  Temperature 98.1, pulse 63, respirations 20, and blood  pressure 140/66.  HEENT EVALUATION:  Neck is supple.  Head is normocephalic and  atraumatic.  ABDOMEN:  Soft.  EXTREMITIES:  No significant edema.  She has a bruise involving the  dorsum of left foot.  MENTATION:  She is currently awake and alert today.  She again has some  lapses in her memory  as far as what led her to come to the hospital, but  she otherwise has pretty good remote memory.  She is loosely coherent.  She follows commands well.  Speech and language are unremarkable.  CRANIAL NERVES EVALUATION:  Pupils were equal, round, and reactive to  light and accommodation.  Extraocular movements are full, but she does  have sustain engaged nystagmus bilaterally.  Visual fields were intact.  Facial muscle strength is symmetric.  Tongue is midline and uvula  midline.  Shoulder shrug normal.  MOTOR EXAMINATION:  Shows normal tone and bilateral strength.  There is  no pronator drift.  Coordination shows no tremors, dysmetria, or  parkinsonism.  Reflexes are brisk.  Plantar reflexes are both downgoing.  Sensation normal to temperature and light touch.   LABORATORY:  Today's evaluation, dilantin 39.9 on yesterday; today, it  is now down to 30, alcohol level undetected, WBC 8, hemoglobin 13, and  platelet count 288.  Sodium 138, potassium 3.5, chloride 105, CO2 26,  BUN 4, creatinine 0.7, glucose 94, and calcium 8.8.  Urinalysis  negative.  Magnesium 2.0.    IMPRESSION:  1. Seizures, on supratherapeutic Dilantin.  2. There is some suggestion that this may be nonepileptic related to      her anxiety, but this is hard to sort out at this point in time.  3. Encephalopathy resolving due to postictal state and dilantin      toxicity.   RECOMMENDATIONS:  1. Agree with EEG.  2. She will restart Dilantin in about 2 days from now at 200 mg b.i.d.      branded medication only.  She will need close follow up.  If she      continues to have these events, she may need to have a second agent      such as Keppra.      Kofi A. Gerilyn Pilgrim, M.D.  Electronically Signed     KAD/MEDQ  D:  04/27/2008  T:  04/27/2008  Job:  [id number]

## 2011-03-16 NOTE — Discharge Summary (Signed)
NAMEMarland Kitchen  ARRIYAH, MADEJ NO.:  [id number]   MEDICAL RECORD NO.:  [id number]          PATIENT TYPE:  IPS   LOCATION:  0302                          FACILITY:  [facility]   PHYSICIAN:  Jasmine Pang, M.D. DATE OF BIRTH:  November 02, 1950   DATE OF ADMISSION:  06/13/2009  DATE OF DISCHARGE:  06/16/2009                               DISCHARGE SUMMARY   IDENTIFICATION:  This is a 60 year old married female who was admitted  on a voluntary basis on June 13, 2009.   HISTORY OF PRESENT ILLNESS:  The patient states she has had suicidal  thoughts since 2 weeks prior to June 02, 2009, which is the anniversary  of her deceased daughter's birthday.  She has been dead for 2 years on  11/19/06, of an unintentional methadone overdose.  The patient  has been seeing visions of her daughter.  She states she has no reason  to live.  She would rather be dead.  She has been thinking of jumping in  front of a moving vehicle.  She sees Dr. Thomasena Edis at [facility] in  [place].  This is second be [facility] admission for the patient.  Prior [facility] admission was October 2009 prior admission to [facility], also  multiple SSRI trials.  For further admission information, see  psychiatric admission assessment.  Initially, an Axis I diagnosis of  major depressive disorder recurrent, severe was given.  On Axis III  diagnosis of seizure disorder.   PHYSICAL FINDINGS:  There were no acute physical or medical problems  noted.   DIAGNOSTIC STUDIES:  Her Dilantin level was 6.8.  Potassium was 3.3.  Lithium was less than 0.28.   HOSPITAL COURSE:  In individual sessions, the patient was placed on  Abilify 5 mg at bedtime, omeprazole 20 mg daily, Dilantin 100 mg 4  tablets by mouth at bedtime, Mirtazapine 30 mg at bedtime, albuterol  inhaler and nebulizer q.4 h. p.r.n.  She was not initially restarted on  her lithium carbonate.  She was also started on Klonopin 0.5 mg t.i.d.  and Seroquel 25 mg t.i.d. and at  h.s.  Mirtazapine and Abilify were  discontinued.  On June 15, 2009, however, the Seroquel was  discontinued as the patient states she has had a history of  hyperglycemia with this medication.  Initially in individual sessions,  the patient was disheveled with minimal eye contact.  There was  psychomotor retardation.  Speech was soft and slow.  Mood was depressed  and anxious.  Affect was consistent with mood. There was positive  suicidal ideation, no homicidal ideation, no evidence of psychosis or a  thought disorder.  The patient stated she missed her daughter very much  I wish she had been me, instead of her.  She also admits she was using  some marijuana, Sometimes I don't want to be here.  She had been  started on Abilify, but discontinued this because it made her feel  irritable.  As hospitalization progressed, the patient became less  depressed and less anxious.  Sleep was good.  Appetite was good on  June 15, 2009.  She was started on Celexa 20 mg p.o. daily.  She was  also started on trazodone 50 mg p.o. q.h.s. p.r.n. may repeat x1 as she  has trouble getting to sleep on the night of June 15, 2009.  She  discussed her smoking marijuana periodically and knew this was not good  for her.  On June 16, 2009, mental status had improved.  She was  casually dressed and cooperative with good eye contact.  Speech was  normal rate and flow.  Psychomotor activity was within normal limits.  Mood was less depressed, less anxious.  Affect consistent with mood.  There was no suicidal or homicidal ideation.  No thoughts of self-  injurious behavior.  No auditory or visual hallucinations.  No paranoia  or delusions.  Thoughts were logical and goal directed.  Thought  content, no predominant theme.  Cognitive was grossly intact.  The  patient wanted to go home today and she was felt to be safe for  discharge.  Her husband took her home.   DISCHARGE DIAGNOSES:  Axis I:  Major depressive  disorder, recurrent,  severe with psychosis, and generalized anxiety disorder.  Axis III:  None.  Axis III:  Seizure disorder.  Axis IV:  Severe (protracted grief, problems with primary support group,  problems related to social environment, other psychosocial problems, and  burden of medical problem).  Axis V:  Global assessment of functioning is 50 upon discharge.  Global  assessment of functioning was 42 upon admission.  Global assessment of  functioning highest past year 23.   DISCHARGE PLAN:  There were no specific activity level or dietary  restrictions.   POST HOSPITAL CARE PLANS:  The patient will go to [facility] [place]  on June 20, 2009, at 8 a.m.   DISCHARGE MEDICATIONS:  1. Dilantin 400 mg at bedtime.  2. Celexa 20 mg at bedtime.  She is to continue her Prilosec and      albuterol as directed by her physician.  3. Klonopin 0.5 mg t.i.d.  4. Seroquel 25 mg t.i.d. and Seroquel 50 mg at bedtime.      Jasmine Pang, M.D.  Electronically Signed     BHS/MEDQ  D:  06/30/2009  T:  07/01/2009  Job:  [id number]

## 2011-04-08 ENCOUNTER — Emergency Department (HOSPITAL_COMMUNITY)
Admission: EM | Admit: 2011-04-08 | Discharge: 2011-04-09 | Disposition: A | Discharge status: Home / Self Care | Payer: Medicaid Other | Attending: Emergency Medicine | Admitting: Emergency Medicine

## 2011-04-08 ENCOUNTER — Emergency Department (HOSPITAL_COMMUNITY): Payer: Medicaid Other

## 2011-04-08 DIAGNOSIS — F17200 Nicotine dependence, unspecified, uncomplicated: Secondary | ICD-10-CM | POA: Insufficient documentation

## 2011-04-08 DIAGNOSIS — Z9089 Acquired absence of other organs: Secondary | ICD-10-CM | POA: Insufficient documentation

## 2011-04-08 DIAGNOSIS — R1013 Epigastric pain: Secondary | ICD-10-CM | POA: Insufficient documentation

## 2011-04-08 DIAGNOSIS — R197 Diarrhea, unspecified: Secondary | ICD-10-CM | POA: Insufficient documentation

## 2011-04-08 LAB — COMPREHENSIVE METABOLIC PANEL
ALT: 16 U/L (ref 0–35)
Alkaline Phosphatase: 190 U/L — ABNORMAL HIGH (ref 39–117)
BUN: 11 mg/dL (ref 6–23)
CO2: 27 mEq/L (ref 19–32)
Calcium: 9.2 mg/dL (ref 8.4–10.5)
GFR calc Af Amer: >60 mL/min (ref >60)
GFR calc non Af Amer: >60 mL/min (ref >60)
Glucose, Bld: 105 mg/dL — ABNORMAL HIGH (ref 70–99)
Potassium: 3.6 mEq/L (ref 3.5–5.1)
Sodium: 139 mEq/L (ref 135–145)
Total Protein: 6.9 g/dL (ref 6.0–8.3)

## 2011-04-08 LAB — CBC
HCT: 35.5 % — ABNORMAL LOW (ref 36.0–46.0)
Hemoglobin: 12.2 g/dL (ref 12.0–15.0)
MCH: 31.5 pg (ref 26.0–34.0)
MCHC: 34.4 g/dL (ref 30.0–36.0)
MCV: 91.7 fL (ref 78.0–100.0)
Platelets: 244 K/uL (ref 150–400)
RBC: 3.87 MIL/uL (ref 3.87–5.11)
RDW: 13.4 % (ref 11.5–15.5)
WBC: 9.6 K/uL (ref 4.0–10.5)

## 2011-04-08 LAB — COMPREHENSIVE METABOLIC PANEL WITH GFR
AST: 28 U/L (ref 0–37)
Albumin: 3.4 g/dL — ABNORMAL LOW (ref 3.5–5.2)
Chloride: 104 meq/L (ref 96–112)
Creatinine, Ser: 0.84 mg/dL (ref 0.4–1.2)
Total Bilirubin: 0.2 mg/dL — ABNORMAL LOW (ref 0.3–1.2)

## 2011-04-08 LAB — DIFFERENTIAL
Basophils Absolute: 0.1 K/uL (ref 0.0–0.1)
Basophils Relative: 1 % (ref 0–1)
Eosinophils Absolute: 0.3 K/uL (ref 0.0–0.7)
Eosinophils Relative: 3 % (ref 0–5)
Lymphocytes Relative: 32 % (ref 12–46)
Lymphs Abs: 3.1 K/uL (ref 0.7–4.0)
Monocytes Absolute: 0.8 10*3/uL (ref 0.1–1.0)
Monocytes Relative: 8 % (ref 3–12)
Neutro Abs: 5.4 10*3/uL (ref 1.7–7.7)
Neutrophils Relative %: 56 % (ref 43–77)

## 2011-04-08 LAB — LIPASE, BLOOD: Lipase: 37 U/L (ref 11–59)

## 2011-04-09 LAB — URINALYSIS, ROUTINE W REFLEX MICROSCOPIC
Bilirubin Urine: NEGATIVE
Glucose, UA: NEGATIVE mg/dL
Ketones, ur: NEGATIVE mg/dL
Leukocytes, UA: NEGATIVE
Nitrite: NEGATIVE
Protein, ur: NEGATIVE mg/dL
Specific Gravity, Urine: <1.005 — ABNORMAL LOW (ref 1.005–1.030)
Urobilinogen, UA: 0.2 mg/dL (ref 0.0–1.0)
pH: 5.5 (ref 5.0–8.0)

## 2011-04-09 LAB — URINE MICROSCOPIC-ADD ON

## 2011-07-03 ENCOUNTER — Other Ambulatory Visit: Payer: Self-pay | Admitting: Gastroenterology

## 2011-07-25 LAB — CBC
HCT: 38.6
Hemoglobin: 13.8
MCV: 93.8
WBC: 8.9

## 2011-07-25 LAB — BASIC METABOLIC PANEL
BUN: 3 — ABNORMAL LOW
Chloride: 100
GFR calc Af Amer: >60
GFR calc non Af Amer: >60
Potassium: 3.3 — ABNORMAL LOW
Sodium: 137

## 2011-07-25 LAB — DIFFERENTIAL
Eosinophils Absolute: 0.1
Eosinophils Relative: 2
Lymphocytes Relative: 38
Lymphs Abs: 3.4
Monocytes Absolute: 0.9

## 2011-07-25 LAB — URINALYSIS, ROUTINE W REFLEX MICROSCOPIC
Leukocytes, UA: NEGATIVE
Nitrite: NEGATIVE
Specific Gravity, Urine: <1.005 — ABNORMAL LOW
Urobilinogen, UA: 0.2
pH: 6.5

## 2011-07-25 LAB — URINE MICROSCOPIC-ADD ON

## 2011-07-25 LAB — RAPID URINE DRUG SCREEN, HOSP PERFORMED
Cocaine: NOT DETECTED
Tetrahydrocannabinol: NOT DETECTED

## 2011-07-25 LAB — HEMOGLOBIN AND HEMATOCRIT, BLOOD
HCT: 36.5
Hemoglobin: 13.2

## 2011-07-25 LAB — ETHANOL: Alcohol, Ethyl (B): <5

## 2011-07-26 LAB — URINALYSIS, ROUTINE W REFLEX MICROSCOPIC
Bilirubin Urine: NEGATIVE
Ketones, ur: NEGATIVE
Leukocytes, UA: NEGATIVE
Leukocytes, UA: NEGATIVE
Nitrite: NEGATIVE
Nitrite: NEGATIVE
Protein, ur: 30 — AB
Specific Gravity, Urine: <1.005 — ABNORMAL LOW
Urobilinogen, UA: 0.2
pH: 6.5
pH: 7

## 2011-07-26 LAB — DIFFERENTIAL
Basophils Absolute: 0
Basophils Absolute: 0
Basophils Relative: 0
Basophils Relative: 1
Eosinophils Absolute: 0.2
Eosinophils Absolute: 0.1
Eosinophils Relative: 2
Eosinophils Relative: 3
Lymphocytes Relative: 37
Lymphocytes Relative: 45
Lymphs Abs: 2.5
Lymphs Abs: 2.9
Monocytes Absolute: 0.7
Monocytes Absolute: 0.8
Monocytes Relative: 9
Monocytes Relative: 10
Neutro Abs: 5.5
Neutro Abs: 4
Neutrophils Relative %: 61
Neutrophils Relative %: 51

## 2011-07-26 LAB — BASIC METABOLIC PANEL
BUN: 2 — ABNORMAL LOW
BUN: 4 — ABNORMAL LOW
CO2: 26
CO2: 29
CO2: 26
Chloride: 105
Chloride: 106
Creatinine, Ser: 0.71
GFR calc non Af Amer: >60
GFR calc non Af Amer: >60
Glucose, Bld: 101 — ABNORMAL HIGH
Glucose, Bld: 94
Glucose, Bld: 87
Potassium: 3.5
Potassium: 3.5
Potassium: 3.8
Sodium: 138

## 2011-07-26 LAB — CBC
HCT: 34.2 — ABNORMAL LOW
HCT: 38
HCT: 30.9 — ABNORMAL LOW
Hemoglobin: 11.9 — ABNORMAL LOW
Hemoglobin: 13.3
Hemoglobin: 13
MCHC: 34.8
MCHC: 34
MCHC: 34
MCV: 95.5
MCV: 97.1
Platelets: 222
Platelets: 285
RDW: 13.6
RDW: 13.8
RDW: 13.7
RDW: 14

## 2011-07-26 LAB — URINE CULTURE
Colony Count: NO GROWTH
Special Requests: NEGATIVE

## 2011-07-26 LAB — TSH: TSH: 1.626

## 2011-07-26 LAB — COMPREHENSIVE METABOLIC PANEL
ALT: 22
Albumin: 3.8
BUN: 4 — ABNORMAL LOW
BUN: 4 — ABNORMAL LOW
Calcium: 8.2 — ABNORMAL LOW
Calcium: 8.9
Glucose, Bld: 120 — ABNORMAL HIGH
Glucose, Bld: 102 — ABNORMAL HIGH
Sodium: 140
Total Protein: 6
Total Protein: 6.5

## 2011-07-26 LAB — PHOSPHORUS: Phosphorus: 4.2

## 2011-07-26 LAB — URINE MICROSCOPIC-ADD ON

## 2011-07-26 LAB — APTT: aPTT: 34

## 2011-07-26 LAB — PROTIME-INR: Prothrombin Time: 15

## 2011-07-27 LAB — COMPREHENSIVE METABOLIC PANEL
Albumin: 3.6
Alkaline Phosphatase: 172 — ABNORMAL HIGH
BUN: 6
CO2: 27
Chloride: 105
GFR calc non Af Amer: >60
Glucose, Bld: 100 — ABNORMAL HIGH
Potassium: 4
Total Bilirubin: 0.3

## 2011-07-27 LAB — URINALYSIS, ROUTINE W REFLEX MICROSCOPIC
Glucose, UA: NEGATIVE
Specific Gravity, Urine: 1.005
pH: 7

## 2011-07-27 LAB — DIFFERENTIAL
Basophils Absolute: 0.1
Basophils Relative: 1
Monocytes Absolute: 0.7
Neutro Abs: 3.5

## 2011-07-27 LAB — CBC
HCT: 37.2
Hemoglobin: 12.8
Platelets: 272
RBC: 3.88
WBC: 6.8

## 2011-07-30 LAB — ETHANOL: Alcohol, Ethyl (B): <5

## 2011-07-30 LAB — COMPREHENSIVE METABOLIC PANEL
ALT: 17
AST: 12
CO2: 25
Calcium: 9
GFR calc Af Amer: >60
Sodium: 141
Total Protein: 6.5

## 2011-07-30 LAB — DIFFERENTIAL
Basophils Absolute: 0
Basophils Relative: 1
Monocytes Relative: 11
Neutro Abs: 4.4
Neutrophils Relative %: 49

## 2011-07-30 LAB — URINALYSIS, ROUTINE W REFLEX MICROSCOPIC
Bilirubin Urine: NEGATIVE
Specific Gravity, Urine: 1.015
pH: 7.5

## 2011-07-30 LAB — PHENYTOIN LEVEL, TOTAL: Phenytoin Lvl: 12.5

## 2011-07-30 LAB — BASIC METABOLIC PANEL
BUN: 4 — ABNORMAL LOW
CO2: 25
Calcium: 9.1
Creatinine, Ser: 0.8
GFR calc Af Amer: >60

## 2011-07-30 LAB — CBC
MCHC: 35
MCHC: 34.8
Platelets: 283
RBC: 4.02
RBC: 4.16
RDW: 13.1

## 2011-07-30 LAB — URINE MICROSCOPIC-ADD ON

## 2011-07-30 LAB — GLUCOSE, CAPILLARY: Glucose-Capillary: 113 — ABNORMAL HIGH

## 2011-07-30 LAB — URINE CULTURE

## 2011-07-30 LAB — RAPID URINE DRUG SCREEN, HOSP PERFORMED
Amphetamines: NOT DETECTED
Barbiturates: NOT DETECTED
Benzodiazepines: NOT DETECTED

## 2011-07-31 LAB — GLUCOSE, CAPILLARY
Glucose-Capillary: 111 — ABNORMAL HIGH
Glucose-Capillary: 111 — ABNORMAL HIGH

## 2011-08-29 ENCOUNTER — Other Ambulatory Visit (HOSPITAL_COMMUNITY)
Admission: RE | Admit: 2011-08-29 | Discharge: 2011-08-29 | Disposition: A | Discharge status: Home / Self Care | Payer: Medicaid Other | Source: Ambulatory Visit | Attending: Orthopaedic Surgery | Admitting: Orthopaedic Surgery

## 2011-08-29 ENCOUNTER — Other Ambulatory Visit: Payer: Self-pay | Admitting: Orthopaedic Surgery

## 2011-08-29 DIAGNOSIS — M6530 Trigger finger, unspecified finger: Secondary | ICD-10-CM | POA: Insufficient documentation

## 2011-10-26 ENCOUNTER — Encounter: Payer: Self-pay | Admitting: Emergency Medicine

## 2011-10-26 ENCOUNTER — Emergency Department (HOSPITAL_COMMUNITY)
Admission: EM | Admit: 2011-10-26 | Discharge: 2011-10-26 | Disposition: A | Discharge status: Home / Self Care | Payer: Medicaid Other | Attending: Emergency Medicine | Admitting: Emergency Medicine

## 2011-10-26 ENCOUNTER — Emergency Department (HOSPITAL_COMMUNITY): Payer: Medicaid Other

## 2011-10-26 DIAGNOSIS — S0083XA Contusion of other part of head, initial encounter: Secondary | ICD-10-CM

## 2011-10-26 DIAGNOSIS — H538 Other visual disturbances: Secondary | ICD-10-CM | POA: Insufficient documentation

## 2011-10-26 DIAGNOSIS — M542 Cervicalgia: Secondary | ICD-10-CM | POA: Insufficient documentation

## 2011-10-26 DIAGNOSIS — Z9889 Other specified postprocedural states: Secondary | ICD-10-CM | POA: Insufficient documentation

## 2011-10-26 DIAGNOSIS — S1093XA Contusion of unspecified part of neck, initial encounter: Secondary | ICD-10-CM | POA: Insufficient documentation

## 2011-10-26 DIAGNOSIS — R569 Unspecified convulsions: Secondary | ICD-10-CM | POA: Insufficient documentation

## 2011-10-26 DIAGNOSIS — H532 Diplopia: Secondary | ICD-10-CM | POA: Insufficient documentation

## 2011-10-26 DIAGNOSIS — W19XXXA Unspecified fall, initial encounter: Secondary | ICD-10-CM

## 2011-10-26 DIAGNOSIS — Z9079 Acquired absence of other genital organ(s): Secondary | ICD-10-CM | POA: Insufficient documentation

## 2011-10-26 DIAGNOSIS — R51 Headache: Secondary | ICD-10-CM | POA: Insufficient documentation

## 2011-10-26 DIAGNOSIS — W1809XA Striking against other object with subsequent fall, initial encounter: Secondary | ICD-10-CM | POA: Insufficient documentation

## 2011-10-26 DIAGNOSIS — S0003XA Contusion of scalp, initial encounter: Secondary | ICD-10-CM | POA: Insufficient documentation

## 2011-10-26 DIAGNOSIS — H5710 Ocular pain, unspecified eye: Secondary | ICD-10-CM | POA: Insufficient documentation

## 2011-10-26 DIAGNOSIS — S0093XA Contusion of unspecified part of head, initial encounter: Secondary | ICD-10-CM

## 2011-10-26 DIAGNOSIS — F17200 Nicotine dependence, unspecified, uncomplicated: Secondary | ICD-10-CM | POA: Insufficient documentation

## 2011-10-26 HISTORY — DX: Unspecified convulsions: R56.9

## 2011-10-26 MED ORDER — ONDANSETRON 8 MG PO TBDP
ORAL_TABLET | ORAL | Status: AC
  Administered 2011-10-26: 8 mg via ORAL
  Filled 2011-10-26: qty 1

## 2011-10-26 MED ORDER — ONDANSETRON 8 MG PO TBDP
8.0000 mg | ORAL_TABLET | Freq: Once | ORAL | Status: AC
Start: 2011-10-26 — End: 2011-10-26
  Administered 2011-10-26: 8 mg via ORAL

## 2011-10-26 MED ORDER — HYDROCODONE-ACETAMINOPHEN 5-325 MG PO TABS: 1.0000 | ORAL_TABLET | ORAL | Status: AC | PRN

## 2011-10-26 NOTE — ED Notes (Signed)
Pt c/o nausea, Dr. Cook notified, additional orders given  

## 2011-10-26 NOTE — ED Notes (Signed)
Pt states she tripped and fell 2 days ago and her head hit a trash can. She does not know if she had LOC. States she is having blurred vision and double vision at times in the left eye. States she is having numbness to forehead and into scalp. Sensitive to light.

## 2011-10-26 NOTE — ED Provider Notes (Signed)
History   This chart was scribed for Sabrina Hutching, MD by Clarita Crane. The patient was seen in room [facility] and the patient's care was started at 10:53AM.   CSN: [id number]  Arrival date & time 10/26/11  0950   First MD Initiated Contact with Patient 10/26/11 1008      Chief Complaint  Patient presents with  . Fall    (Consider location/radiation/quality/duration/timing/severity/associated sxs/prior treatment) HPI [name] is a 60 y.o. female who presents to the Emergency Department complaining of constant moderate pain to left periorbital region and crown of head onset several days ago after sustaining a fall and persistent since with associated bruising, and intermittent blurred vision and diplopia. Patient states she was stepping off of her front porch when she tripped on a rock and struck her face against a nearby trash can just prior to onset of pain and bruising. Denies hip pain, back pain, abdominal pain, chest pain, neck pain.   Past Medical History  Diagnosis Date  . Seizures     Past Surgical History  Procedure Date  . Cholecystectomy   . Abdominal hysterectomy     No family history on file.  History  Substance Use Topics  . Smoking status: Current Everyday Smoker  . Smokeless tobacco: Not on file  . Alcohol Use: No    OB History    Grav Para Term Preterm Abortions TAB SAB Ect Mult Living                  Review of Systems 10 Systems reviewed and are negative for acute change except as noted in the HPI.  Allergies  Lithium and Varenicline tartrate  Home Medications   Current Outpatient Rx  Name Route Sig Dispense Refill  . BENTYL 10 MG PO CAPS  TAKE 1 CAPSULE BY MOUTH  30 MINUTES BEFOREBREAKFAST AND AT BEDTIME AS DIRECTED. 60 each 5  . CLONAZEPAM 1 MG PO TABS Oral Take 1 mg by mouth 3 (three) times daily.      Marland Kitchen HYDROCODONE-ACETAMINOPHEN 7.5-325 MG PO TABS Oral Take 0.5 tablets by mouth every 6 (six) hours as needed. For pain      .  PANTOPRAZOLE SODIUM 40 MG PO TBEC Oral Take 40 mg by mouth 2 (two) times daily.      Marland Kitchen PHENYTOIN SODIUM EXTENDED 100 MG PO CAPS Oral Take 400 mg by mouth at bedtime.      Marland Kitchen PRESCRIPTION MEDICATION Oral Take 1 tablet by mouth daily. Unknown name or strength. Indication: for mood swings       BP 113/66  Pulse 70  Temp 98.6 F (37 C)  Resp 20  Ht 5\' 4"  (1.626 m)  Wt 143 lb (64.864 kg)  BMI 24.55 kg/m2  SpO2 94%  Physical Exam  Nursing note and vitals reviewed. Constitutional: She is oriented to person, place, and time. She appears well-developed and well-nourished. No distress.  HENT:  Head: Normocephalic and atraumatic.  Mouth/Throat: Oropharynx is clear and moist.  Eyes: EOM are normal. Pupils are equal, round, and reactive to light.  Neck: Neck supple. No tracheal deviation present.  Cardiovascular: Normal rate.   Pulmonary/Chest: Effort normal. No respiratory distress.  Abdominal: Soft. She exhibits no distension.  Musculoskeletal: Normal range of motion. She exhibits no edema.       Tender and ecchymotic to left periorbital region. Tender to top of scalp.   Neurological: She is alert and oriented to person, place, and time. No sensory deficit.  Skin: Skin  is warm and dry.  Psychiatric: She has a normal mood and affect. Her behavior is normal.    ED Course  Procedures (including critical care time)  DIAGNOSTIC STUDIES: Oxygen Saturation is 100% on room air, normal by my interpretation.    COORDINATION OF CARE: 10:55AM- Patient informed of intent to inform imaging of head and face. Patient agrees with plan set forth at this time.    Labs Reviewed - No data to display Ct Head Wo Contrast  10/26/2011  *RADIOLOGY REPORT*  Clinical Data:  Fall.  Pain to left sided face.  Left eye and neck pain.  Headache and blurred vision in the left eye.  CT HEAD WITHOUT CONTRAST CT MAXILLOFACIAL WITHOUT CONTRAST CT CERVICAL SPINE WITHOUT CONTRAST  Technique:  Multidetector CT imaging of  the head, cervical spine, and maxillofacial structures were performed using the standard protocol without intravenous contrast. Multiplanar CT image reconstructions of the cervical spine and maxillofacial structures were also generated.  Comparison:  None available.  CT HEAD  Findings: There was a malfunction of a detector on the CT scanner producing a hyperdense artifact at regular intervals within the central portion of the scan plane.  The patient was not rescanned at this time as I would expect the same artifact to be recreated.  Accounting for this, no acute infarct, hemorrhage, mass lesion is present.  The ventricles are of normal size.  No significant extra- axial fluid collection is present.  A left temporal scalp hematoma is present.  There is no underlying fracture.  IMPRESSION:  1.  Left temporal scalp hematoma without underlying fracture. 2.  Hyperdense artifact within the central portion of the scan plane as described.  Please see discussion above. 3.  Otherwise normal CT appearance of the brain.  CT MAXILLOFACIAL  Findings:  The left temporal scalp hematoma is not included in the field of view.  Minimal left supraorbital scalp soft tissue swelling is present.  There is no underlying fracture.  The paranasal sinuses and mastoid air cells are clear.  Nasal bones are intact.  The mandible is intact and located.  Degenerative changes are noted at the TMJ bilaterally.  The patient has upper dentures in place.  IMPRESSION:  1.  Minimal left supraorbital soft tissue swelling without underlying fracture. 2.  Degenerative changes of the TMJ bilaterally.  CT CERVICAL SPINE  Findings:   The cervical spine is imaged from the skull base through T3.  The vertebral body heights and alignment are maintained.  Mild osseous foraminal narrowing is present on the right at C6-7 and C7-T1.  Osseous foraminal narrowing is evident on the left at C6-7.  Mild degenerative changes are present at C1-2.  The soft tissues are  unremarkable.  IMPRESSION:  1.  No acute abnormality. 2.  Mild spondylosis of the cervical spine as described.  Original Report Authenticated By: Jamesetta Orleans. MATTERN, M.D.   Ct Cervical Spine Wo Contrast  10/26/2011  *RADIOLOGY REPORT*  Clinical Data:  Fall.  Pain to left sided face.  Left eye and neck pain.  Headache and blurred vision in the left eye.  CT HEAD WITHOUT CONTRAST CT MAXILLOFACIAL WITHOUT CONTRAST CT CERVICAL SPINE WITHOUT CONTRAST  Technique:  Multidetector CT imaging of the head, cervical spine, and maxillofacial structures were performed using the standard protocol without intravenous contrast. Multiplanar CT image reconstructions of the cervical spine and maxillofacial structures were also generated.  Comparison:  None available.  CT HEAD  Findings: There was a malfunction of a detector  on the CT scanner producing a hyperdense artifact at regular intervals within the central portion of the scan plane.  The patient was not rescanned at this time as I would expect the same artifact to be recreated.  Accounting for this, no acute infarct, hemorrhage, mass lesion is present.  The ventricles are of normal size.  No significant extra- axial fluid collection is present.  A left temporal scalp hematoma is present.  There is no underlying fracture.  IMPRESSION:  1.  Left temporal scalp hematoma without underlying fracture. 2.  Hyperdense artifact within the central portion of the scan plane as described.  Please see discussion above. 3.  Otherwise normal CT appearance of the brain.  CT MAXILLOFACIAL  Findings:  The left temporal scalp hematoma is not included in the field of view.  Minimal left supraorbital scalp soft tissue swelling is present.  There is no underlying fracture.  The paranasal sinuses and mastoid air cells are clear.  Nasal bones are intact.  The mandible is intact and located.  Degenerative changes are noted at the TMJ bilaterally.  The patient has upper dentures in place.   IMPRESSION:  1.  Minimal left supraorbital soft tissue swelling without underlying fracture. 2.  Degenerative changes of the TMJ bilaterally.  CT CERVICAL SPINE  Findings:   The cervical spine is imaged from the skull base through T3.  The vertebral body heights and alignment are maintained.  Mild osseous foraminal narrowing is present on the right at C6-7 and C7-T1.  Osseous foraminal narrowing is evident on the left at C6-7.  Mild degenerative changes are present at C1-2.  The soft tissues are unremarkable.  IMPRESSION:  1.  No acute abnormality. 2.  Mild spondylosis of the cervical spine as described.  Original Report Authenticated By: Jamesetta Orleans. MATTERN, M.D.   Ct Maxillofacial Wo Cm  10/26/2011  *RADIOLOGY REPORT*  Clinical Data:  Fall.  Pain to left sided face.  Left eye and neck pain.  Headache and blurred vision in the left eye.  CT HEAD WITHOUT CONTRAST CT MAXILLOFACIAL WITHOUT CONTRAST CT CERVICAL SPINE WITHOUT CONTRAST  Technique:  Multidetector CT imaging of the head, cervical spine, and maxillofacial structures were performed using the standard protocol without intravenous contrast. Multiplanar CT image reconstructions of the cervical spine and maxillofacial structures were also generated.  Comparison:  None available.  CT HEAD  Findings: There was a malfunction of a detector on the CT scanner producing a hyperdense artifact at regular intervals within the central portion of the scan plane.  The patient was not rescanned at this time as I would expect the same artifact to be recreated.  Accounting for this, no acute infarct, hemorrhage, mass lesion is present.  The ventricles are of normal size.  No significant extra- axial fluid collection is present.  A left temporal scalp hematoma is present.  There is no underlying fracture.  IMPRESSION:  1.  Left temporal scalp hematoma without underlying fracture. 2.  Hyperdense artifact within the central portion of the scan plane as described.  Please  see discussion above. 3.  Otherwise normal CT appearance of the brain.  CT MAXILLOFACIAL  Findings:  The left temporal scalp hematoma is not included in the field of view.  Minimal left supraorbital scalp soft tissue swelling is present.  There is no underlying fracture.  The paranasal sinuses and mastoid air cells are clear.  Nasal bones are intact.  The mandible is intact and located.  Degenerative changes are noted at  the TMJ bilaterally.  The patient has upper dentures in place.  IMPRESSION:  1.  Minimal left supraorbital soft tissue swelling without underlying fracture. 2.  Degenerative changes of the TMJ bilaterally.  CT CERVICAL SPINE  Findings:   The cervical spine is imaged from the skull base through T3.  The vertebral body heights and alignment are maintained.  Mild osseous foraminal narrowing is present on the right at C6-7 and C7-T1.  Osseous foraminal narrowing is evident on the left at C6-7.  Mild degenerative changes are present at C1-2.  The soft tissues are unremarkable.  IMPRESSION:  1.  No acute abnormality. 2.  Mild spondylosis of the cervical spine as described.  Original Report Authenticated By: Jamesetta Orleans. MATTERN, M.D.     No diagnosis found.    MDM  CT head, neck, face all negative. No neuro deficits       I personally performed the services described in this documentation, which was scribed in my presence. The recorded information has been reviewed and considered.    Sabrina Hutching, MD 10/26/11 1350

## 2011-10-26 NOTE — ED Notes (Signed)
Pt states she tripped and fell x 2 days ago. Unknown loc. Pt c/o blurred vision/headache/numbness to left side of head.

## 2012-02-20 ENCOUNTER — Ambulatory Visit: Payer: Medicaid Other | Attending: Neurology | Admitting: Sleep Medicine

## 2012-02-20 DIAGNOSIS — G4733 Obstructive sleep apnea (adult) (pediatric): Secondary | ICD-10-CM | POA: Insufficient documentation

## 2012-02-20 DIAGNOSIS — G4700 Insomnia, unspecified: Secondary | ICD-10-CM

## 2012-02-20 DIAGNOSIS — Z6833 Body mass index (BMI) 33.0-33.9, adult: Secondary | ICD-10-CM | POA: Insufficient documentation

## 2012-03-07 NOTE — Procedures (Signed)
[name], [name]               ACCOUNT NO.:  [id number]  MEDICAL RECORD NO.:  [id number]          PATIENT TYPE:  OUT  LOCATION:  SLEEP LAB                     FACILITY:  [facility]  PHYSICIAN:  Ellyana Crigler A. Gerilyn Pilgrim, M.D. DATE OF BIRTH:  1951/01/06  DATE OF STUDY:  02/20/2012                           NOCTURNAL POLYSOMNOGRAM  REFERRING PHYSICIAN:  INDICATION:  This 61 year old who presents with snoring, awakening with choking sensation and dyspnea.  This has been done to evaluate for obstructive sleep apnea syndrome.  MEDICATIONS:  Clonazepam, hydrocodone, methocarbamol, dicyclomine, Protonix, Dilantin, albuterol.  EPWORTH SLEEPINESS SCALE:  2.  BMI:  33.  ARCHITECTURAL SUMMARY:  The total recording time is 426 minutes.  Sleep efficiency 68%.  Sleep latency 21 minutes.  REM latency 170 minutes. Stage N1 8.5%, N2 64%, N3 7% and REM sleep 21%.  RESPIRATORY SUMMARY:  Baseline oxygen saturation is 95, lowest saturation 87 during REM sleep.  Diagnostic AHI is 5 and RDI also 5. Events that occurred during REM sleep with a REM/AHI of 19.  LIMB MOVEMENT SUMMARY:  PLM index 1.5.  ELECTROCARDIOGRAM SUMMARY:  Average heart rate is 73 with no significant dysrhythmias observed.  IMPRESSION:  Mild obstructive sleep apnea syndrome, worse during REM sleep, not requiring positive pressure.   Rosea Dory A. Gerilyn Pilgrim, M.D.    KAD/MEDQ  D:  03/01/2012 15:10:27  T:  03/01/2012 18:56:02  Job:  [id number]

## 2012-03-19 ENCOUNTER — Encounter (HOSPITAL_COMMUNITY): Payer: Self-pay | Admitting: Pharmacy Technician

## 2012-03-25 ENCOUNTER — Encounter (HOSPITAL_COMMUNITY): Payer: Self-pay | Admitting: *Deleted

## 2012-03-25 ENCOUNTER — Encounter (HOSPITAL_COMMUNITY)
Admission: RE | Disposition: A | Discharge status: Home / Self Care | Payer: Self-pay | Source: Ambulatory Visit | Attending: Ophthalmology

## 2012-03-25 ENCOUNTER — Ambulatory Visit (HOSPITAL_COMMUNITY)
Admission: RE | Admit: 2012-03-25 | Discharge: 2012-03-25 | Disposition: A | Discharge status: Home / Self Care | Payer: Medicaid Other | Source: Ambulatory Visit | Attending: Ophthalmology | Admitting: Ophthalmology

## 2012-03-25 DIAGNOSIS — H26499 Other secondary cataract, unspecified eye: Secondary | ICD-10-CM | POA: Insufficient documentation

## 2012-03-25 SURGERY — TREATMENT, USING YAG LASER
Site: Eye | Laterality: Right

## 2012-03-25 MED ORDER — TROPICAMIDE 1 % OP SOLN
OPHTHALMIC | Status: AC
  Administered 2012-03-25: 1 [drp] via OPHTHALMIC
  Filled 2012-03-25: qty 3

## 2012-03-25 MED ORDER — TROPICAMIDE 1 % OP SOLN
1.0000 [drp] | OPHTHALMIC | Status: AC
  Administered 2012-03-25 (×2): 1 [drp] via OPHTHALMIC

## 2012-03-25 NOTE — Brief Op Note (Signed)
The patient was re examined and there is no change in the patients condition since the original H and P. 

## 2012-03-25 NOTE — Discharge Instructions (Signed)
[name]  03/25/2012     Instructions    Activity: No Restrictions.   Diet: Resume Diet you were on at home.   Pain Medication: Tylenol if Needed.   CONTACT YOUR DOCTOR IF YOU HAVE PAIN, REDNESS IN YOUR EYE, OR DECREASED VISION.   Follow-up between 2:00 -3:00 with Loraine Leriche T. Nile Riggs, MD.   Dr. Lahoma Crocker: [phone]     If you find that you cannot contact your physician, but feel that your signs and   Symptoms warrant a physician's attention, call the Emergency Room at   [phone] ext.[phone].

## 2012-03-25 NOTE — Brief Op Note (Signed)
[name] 03/25/2012  Swan Fairfax T. Nile Riggs, MD  Yag Laser Self Test Completedyes. Procedure: Posterior Capsulotomy, right eye.  Eye Protection Worn by Staff yes. Laser In Use Sign on Door yes.  Laser: Nd:YAG Spot Size: Fixed Burst Mode: III Power Setting: 3.4 mJ/burst  Number of shots: 22 Total energy delivered: 3.4 mJ  Patency of the peripheral iridotomy was confirmed visually.  The patient tolerated the procedure without difficulty. No complications were encountered.    The patient was discharged home with the instructions to continue all her current glaucoma medications, if any.   Patient instructed to go to office at 0100 for intraocular pressure check.  Patient verbalizes understanding of discharge instructions yes.  [name] 03/25/2012  Kennis Wissmann T. Nile Riggs, MD  Yag Laser Self Test Completedyes. Procedure: Posterior Capsulotomy, right eye.  Eye Protection Worn by Staff yes. Laser In Use Sign on Door yes.  Laser: Nd:YAG Spot Size: Fixed Burst Mode: III Power Setting: 3.4 mJ/burst  Number of shots: 22 Total energy delivered: 72.7 mJ  Patency of the peripheral iridotomy was confirmed visually.  The patient tolerated the procedure without difficulty. No complications were encountered.  .  The patient was discharged home with the instructions to continue all her current glaucoma medications, if any.   Patient instructed to go to office at 0100 for intraocular pressure check.  Patient verbalizes understanding of discharge instructions yes.

## 2012-04-22 ENCOUNTER — Ambulatory Visit (HOSPITAL_COMMUNITY)
Admission: RE | Admit: 2012-04-22 | Discharge: 2012-04-22 | Disposition: A | Discharge status: Home / Self Care | Payer: Medicaid Other | Source: Ambulatory Visit | Attending: Family Medicine | Admitting: Family Medicine

## 2012-04-22 ENCOUNTER — Other Ambulatory Visit (HOSPITAL_COMMUNITY): Payer: Self-pay | Admitting: Family Medicine

## 2012-04-22 DIAGNOSIS — M549 Dorsalgia, unspecified: Secondary | ICD-10-CM

## 2012-04-22 DIAGNOSIS — M949 Disorder of cartilage, unspecified: Secondary | ICD-10-CM | POA: Insufficient documentation

## 2012-04-22 DIAGNOSIS — M546 Pain in thoracic spine: Secondary | ICD-10-CM | POA: Insufficient documentation

## 2012-04-22 DIAGNOSIS — M5137 Other intervertebral disc degeneration, lumbosacral region: Secondary | ICD-10-CM | POA: Insufficient documentation

## 2012-04-22 DIAGNOSIS — M545 Low back pain, unspecified: Secondary | ICD-10-CM | POA: Insufficient documentation

## 2012-04-22 DIAGNOSIS — M899 Disorder of bone, unspecified: Secondary | ICD-10-CM | POA: Insufficient documentation

## 2012-04-22 DIAGNOSIS — M51379 Other intervertebral disc degeneration, lumbosacral region without mention of lumbar back pain or lower extremity pain: Secondary | ICD-10-CM | POA: Insufficient documentation

## 2012-04-22 DIAGNOSIS — IMO0002 Reserved for concepts with insufficient information to code with codable children: Secondary | ICD-10-CM | POA: Insufficient documentation

## 2012-11-12 ENCOUNTER — Emergency Department (HOSPITAL_COMMUNITY): Payer: Self-pay

## 2012-11-12 ENCOUNTER — Emergency Department (HOSPITAL_COMMUNITY)
Admission: EM | Admit: 2012-11-12 | Discharge: 2012-11-12 | Disposition: A | Discharge status: Home / Self Care | Payer: Self-pay | Attending: Emergency Medicine | Admitting: Emergency Medicine

## 2012-11-12 ENCOUNTER — Encounter (HOSPITAL_COMMUNITY): Payer: Self-pay | Admitting: *Deleted

## 2012-11-12 DIAGNOSIS — R197 Diarrhea, unspecified: Secondary | ICD-10-CM | POA: Insufficient documentation

## 2012-11-12 DIAGNOSIS — Z79899 Other long term (current) drug therapy: Secondary | ICD-10-CM | POA: Insufficient documentation

## 2012-11-12 DIAGNOSIS — B349 Viral infection, unspecified: Secondary | ICD-10-CM

## 2012-11-12 DIAGNOSIS — R112 Nausea with vomiting, unspecified: Secondary | ICD-10-CM | POA: Insufficient documentation

## 2012-11-12 DIAGNOSIS — Z8659 Personal history of other mental and behavioral disorders: Secondary | ICD-10-CM | POA: Insufficient documentation

## 2012-11-12 DIAGNOSIS — R059 Cough, unspecified: Secondary | ICD-10-CM | POA: Insufficient documentation

## 2012-11-12 DIAGNOSIS — Z8719 Personal history of other diseases of the digestive system: Secondary | ICD-10-CM | POA: Insufficient documentation

## 2012-11-12 DIAGNOSIS — R109 Unspecified abdominal pain: Secondary | ICD-10-CM | POA: Insufficient documentation

## 2012-11-12 DIAGNOSIS — F319 Bipolar disorder, unspecified: Secondary | ICD-10-CM | POA: Insufficient documentation

## 2012-11-12 DIAGNOSIS — F1011 Alcohol abuse, in remission: Secondary | ICD-10-CM | POA: Insufficient documentation

## 2012-11-12 DIAGNOSIS — J3489 Other specified disorders of nose and nasal sinuses: Secondary | ICD-10-CM | POA: Insufficient documentation

## 2012-11-12 DIAGNOSIS — R5383 Other fatigue: Secondary | ICD-10-CM | POA: Insufficient documentation

## 2012-11-12 DIAGNOSIS — Z8711 Personal history of peptic ulcer disease: Secondary | ICD-10-CM | POA: Insufficient documentation

## 2012-11-12 DIAGNOSIS — G40909 Epilepsy, unspecified, not intractable, without status epilepticus: Secondary | ICD-10-CM | POA: Insufficient documentation

## 2012-11-12 DIAGNOSIS — B9789 Other viral agents as the cause of diseases classified elsewhere: Secondary | ICD-10-CM | POA: Insufficient documentation

## 2012-11-12 DIAGNOSIS — R5381 Other malaise: Secondary | ICD-10-CM | POA: Insufficient documentation

## 2012-11-12 DIAGNOSIS — IMO0001 Reserved for inherently not codable concepts without codable children: Secondary | ICD-10-CM | POA: Insufficient documentation

## 2012-11-12 DIAGNOSIS — K589 Irritable bowel syndrome without diarrhea: Secondary | ICD-10-CM | POA: Insufficient documentation

## 2012-11-12 DIAGNOSIS — F17200 Nicotine dependence, unspecified, uncomplicated: Secondary | ICD-10-CM | POA: Insufficient documentation

## 2012-11-12 DIAGNOSIS — R05 Cough: Secondary | ICD-10-CM | POA: Insufficient documentation

## 2012-11-12 HISTORY — DX: Irritable bowel syndrome, unspecified: K58.9

## 2012-11-12 HISTORY — DX: Unspecified hemorrhoids: K64.9

## 2012-11-12 HISTORY — DX: Gastric ulcer, unspecified as acute or chronic, without hemorrhage or perforation: K25.9

## 2012-11-12 HISTORY — DX: Bipolar disorder, unspecified: F31.9

## 2012-11-12 HISTORY — DX: Anxiety disorder, unspecified: F41.9

## 2012-11-12 HISTORY — DX: Alcohol abuse, in remission: F10.11

## 2012-11-12 LAB — URINE MICROSCOPIC-ADD ON

## 2012-11-12 LAB — BASIC METABOLIC PANEL
BUN: 7 mg/dL (ref 6–23)
Chloride: 101 mEq/L (ref 96–112)
Creatinine, Ser: 0.87 mg/dL (ref 0.50–1.10)
GFR calc Af Amer: 81 mL/min — ABNORMAL LOW (ref >90)
Glucose, Bld: 106 mg/dL — ABNORMAL HIGH (ref 70–99)
Potassium: 4 mEq/L (ref 3.5–5.1)

## 2012-11-12 LAB — CBC WITH DIFFERENTIAL/PLATELET
Basophils Relative: 1 % (ref 0–1)
HCT: 39.4 % (ref 36.0–46.0)
Hemoglobin: 14 g/dL (ref 12.0–15.0)
Lymphs Abs: 2.1 10*3/uL (ref 0.7–4.0)
MCH: 32 pg (ref 26.0–34.0)
MCHC: 35.5 g/dL (ref 30.0–36.0)
Monocytes Absolute: 0.7 10*3/uL (ref 0.1–1.0)
Monocytes Relative: 10 % (ref 3–12)
Neutro Abs: 4.2 10*3/uL (ref 1.7–7.7)
RBC: 4.37 MIL/uL (ref 3.87–5.11)

## 2012-11-12 LAB — HEPATIC FUNCTION PANEL
ALT: 12 U/L (ref 0–35)
Bilirubin, Direct: <0.1 mg/dL (ref 0.0–0.3)
Total Protein: 7.4 g/dL (ref 6.0–8.3)

## 2012-11-12 LAB — URINALYSIS, ROUTINE W REFLEX MICROSCOPIC
Bilirubin Urine: NEGATIVE
Glucose, UA: NEGATIVE mg/dL
Ketones, ur: NEGATIVE mg/dL
Specific Gravity, Urine: <1.005 — ABNORMAL LOW (ref 1.005–1.030)
pH: 7 (ref 5.0–8.0)

## 2012-11-12 LAB — LIPASE, BLOOD: Lipase: 21 U/L (ref 11–59)

## 2012-11-12 LAB — PHENYTOIN LEVEL, TOTAL: Phenytoin Lvl: 16.1 ug/mL (ref 10.0–20.0)

## 2012-11-12 MED ORDER — BENZONATATE 100 MG PO CAPS: 100.0000 mg | ORAL_CAPSULE | Freq: Three times a day (TID) | ORAL | Status: DC | PRN

## 2012-11-12 MED ORDER — ONDANSETRON HCL 4 MG PO TABS: 4.0000 mg | ORAL_TABLET | Freq: Three times a day (TID) | ORAL | Status: DC | PRN

## 2012-11-12 NOTE — ED Provider Notes (Signed)
History     CSN: [id number]  Arrival date & time 11/12/12  1248   First MD Initiated Contact with Patient 11/12/12 1358      Chief Complaint  Patient presents with  . Fever     HPI Pt was seen at 1350.   Per pt, c/o gradual onset and persistence of constant generalized body aches/fatigue, chills, runny/stuffy nose, sinus congestion, and cough for the past 3 to 4 days.  Has been associated with one episodes of N/V today, several episodes of diarrhea and upper abd "pain" for the past 2 days.  Denies CP/palpitations, no SOB/cough, no back pain, no rash, no black or blood in stools or emesis.       Past Medical History  Diagnosis Date  . Seizures   . IBS (irritable bowel syndrome)   . Bipolar disorder   . Anxiety   . Gastric ulcer   . Hemorrhoids   . Alcohol abuse, in remission     Past Surgical History  Procedure Date  . Cholecystectomy   . Abdominal hysterectomy   . Eye surgery     History  Substance Use Topics  . Smoking status: Current Every Day Smoker  . Smokeless tobacco: Not on file  . Alcohol Use: No    Review of Systems ROS: Statement: All systems negative except as marked or noted in the HPI; Constitutional: Negative for fever and +chills, generalized body aches/fatigue. ; ; Eyes: Negative for eye pain, redness and discharge. ; ; ENMT: Negative for ear pain, hoarseness, sore throat. +nasal congestion, sinus pressure. ; ; Cardiovascular: Negative for chest pain, palpitations, diaphoresis, dyspnea and peripheral edema. ; ; Respiratory: +cough. Negative for wheezing and stridor. ; ; Gastrointestinal: Negative for nausea, vomiting, diarrhea, abdominal pain, blood in stool, hematemesis, jaundice and rectal bleeding. ; ; Genitourinary: Negative for dysuria, flank pain and hematuria. ; ; Musculoskeletal: Negative for back pain and neck pain. Negative for swelling and trauma.; ; Skin: Negative for pruritus, rash, abrasions, blisters, bruising and skin lesion.; ; Neuro:  Negative for headache, lightheadedness and neck stiffness. Negative for weakness, altered level of consciousness , altered mental status, extremity weakness, paresthesias, involuntary movement, seizure and syncope.     Allergies  Lithium and Varenicline tartrate  Home Medications   Current Outpatient Rx  Name  Route  Sig  Dispense  Refill  . ALBUTEROL SULFATE HFA 108 (90 BASE) MCG/ACT IN AERS   Inhalation   Inhale 2 puffs into the lungs every 6 (six) hours as needed. For shortness of breath         . ALBUTEROL SULFATE (2.5 MG/3ML) 0.083% IN NEBU   Nebulization   Take 2.5 mg by nebulization every 6 (six) hours as needed. For shortness of breath         . ALPRAZOLAM 1 MG PO TABS   Oral   Take 0.5 mg by mouth at bedtime as needed. Sleep.         . BENTYL 10 MG PO CAPS      TAKE 1 CAPSULE BY MOUTH  30 MINUTES BEFOREBREAKFAST AND AT BEDTIME AS DIRECTED.   60 each   5   . CLONAZEPAM 1 MG PO TABS   Oral   Take 1 mg by mouth 3 (three) times daily.          Marland Kitchen HYDROCODONE-ACETAMINOPHEN 10-325 MG PO TABS   Oral   Take 1 tablet by mouth every 6 (six) hours as needed. Pain.         Marland Kitchen  PANTOPRAZOLE SODIUM 40 MG PO TBEC   Oral   Take 40 mg by mouth 2 (two) times daily.           Marland Kitchen PHENYTOIN SODIUM EXTENDED 100 MG PO CAPS   Oral   Take 400 mg by mouth at bedtime.          Marland Kitchen PSEUDOEPH-DOXYLAMINE-DM-APAP 60-12.03-27-999 MG/30ML PO LIQD   Oral   Take 30 mLs by mouth every 4 (four) hours as needed. Flu symptoms.           BP 130/67  Pulse 80  Temp 98.4 F (36.9 C) (Oral)  Resp 18  Ht 5\' 4"  (1.626 m)  Wt 150 lb (68.04 kg)  BMI 25.75 kg/m2  SpO2 98%  Physical Exam 1355: Physical examination:  Nursing notes reviewed; Vital signs and O2 SAT reviewed;  Constitutional: Well developed, Well nourished, Well hydrated, In no acute distress; Head:  Normocephalic, atraumatic; Eyes: EOMI, PERRL, No scleral icterus; ENMT: TM's clear bilat. +edemetous nasal turbinates bilat  with clear rhinorrhea.  Mouth and pharynx without lesions. No tonsillar exudates. No intra-oral edema. No hoarse voice, no drooling, no stridor. No pain with manipulation of larynx. Mouth and pharynx normal, Mucous membranes moist; Neck: Supple, Full range of motion, No lymphadenopathy; Cardiovascular: Regular rate and rhythm, No murmur, rub, or gallop; Respiratory: Breath sounds clear & equal bilaterally, No rales, rhonchi, wheezes.  Speaking full sentences with ease, Normal respiratory effort/excursion; Chest: Nontender, Movement normal; Abdomen: Soft, +mild LUQ tenderness to palp. No rebound or guarding. Nondistended, Normal bowel sounds; Genitourinary: No CVA tenderness; Extremities: Pulses normal, No tenderness, No edema, No calf edema or asymmetry.; Neuro: AA&Ox3, Major CN grossly intact.  Speech clear. No gross focal motor or sensory deficits in extremities.; Skin: Color normal, Warm, Dry.   ED Course  Procedures    MDM  MDM Reviewed: previous chart, nursing note and vitals Reviewed previous: labs Interpretation: labs and x-ray     Results for orders placed during the hospital encounter of 11/12/12  PHENYTOIN LEVEL, TOTAL      Component Value Range   Phenytoin Lvl 16.1  10.0 - 20.0 ug/mL  CBC WITH DIFFERENTIAL      Component Value Range   WBC 7.1  4.0 - 10.5 K/uL   RBC 4.37  3.87 - 5.11 MIL/uL   Hemoglobin 14.0  12.0 - 15.0 g/dL   HCT 40.9  81.1 - 91.4 %   MCV 90.2  78.0 - 100.0 fL   MCH 32.0  26.0 - 34.0 pg   MCHC 35.5  30.0 - 36.0 g/dL   RDW 78.2  95.6 - 21.3 %   Platelets 253  150 - 400 K/uL   Neutrophils Relative 59  43 - 77 %   Neutro Abs 4.2  1.7 - 7.7 K/uL   Lymphocytes Relative 29  12 - 46 %   Lymphs Abs 2.1  0.7 - 4.0 K/uL   Monocytes Relative 10  3 - 12 %   Monocytes Absolute 0.7  0.1 - 1.0 K/uL   Eosinophils Relative 1  0 - 5 %   Eosinophils Absolute 0.1  0.0 - 0.7 K/uL   Basophils Relative 1  0 - 1 %   Basophils Absolute 0.0  0.0 - 0.1 K/uL  BASIC METABOLIC  PANEL      Component Value Range   Sodium 136  135 - 145 mEq/L   Potassium 4.0  3.5 - 5.1 mEq/L   Chloride 101  96 - 112 mEq/L  CO2 21  19 - 32 mEq/L   Glucose, Bld 106 (*) 70 - 99 mg/dL   BUN 7  6 - 23 mg/dL   Creatinine, Ser 7.82  0.50 - 1.10 mg/dL   Calcium 9.5  8.4 - 95.6 mg/dL   GFR calc non Af Amer 70 (*) >90 mL/min   GFR calc Af Amer 81 (*) >90 mL/min  URINALYSIS, ROUTINE W REFLEX MICROSCOPIC      Component Value Range   Color, Urine YELLOW  YELLOW   APPearance CLEAR  CLEAR   Specific Gravity, Urine <1.005 (*) 1.005 - 1.030   pH 7.0  5.0 - 8.0   Glucose, UA NEGATIVE  NEGATIVE mg/dL   Hgb urine dipstick SMALL (*) NEGATIVE   Bilirubin Urine NEGATIVE  NEGATIVE   Ketones, ur NEGATIVE  NEGATIVE mg/dL   Protein, ur NEGATIVE  NEGATIVE mg/dL   Urobilinogen, UA 0.2  0.0 - 1.0 mg/dL   Nitrite NEGATIVE  NEGATIVE   Leukocytes, UA NEGATIVE  NEGATIVE  HEPATIC FUNCTION PANEL      Component Value Range   Total Protein 7.4  6.0 - 8.3 g/dL   Albumin 3.9  3.5 - 5.2 g/dL   AST 18  0 - 37 U/L   ALT 12  0 - 35 U/L   Alkaline Phosphatase 160 (*) 39 - 117 U/L   Total Bilirubin 0.3  0.3 - 1.2 mg/dL   Bilirubin, Direct <2.1  0.0 - 0.3 mg/dL   Indirect Bilirubin NOT CALCULATED  0.3 - 0.9 mg/dL  LIPASE, BLOOD      Component Value Range   Lipase 21  11 - 59 U/L  URINE MICROSCOPIC-ADD ON      Component Value Range   WBC, UA 0-2  <3 WBC/hpf   RBC / HPF 0-2  <3 RBC/hpf   Dg Chest 2 View 11/12/2012  *RADIOLOGY REPORT*  Clinical Data: Fever and cough  CHEST - 2 VIEW  Comparison: 09/10/2010  Findings: The heart size and mediastinal contours are within normal limits.  Both lungs are clear. Mild spondylosis is noted within the thoracic spine.  IMPRESSION: No active cardiopulmonary abnormalities.   Original Report Authenticated By: Signa Kell, M.D.      [phone]:  Afebrile while in the ED.  VS remain stable. No N/V/D while in the ED.  Wants to go home now.  Dx and testing d/w pt and family.   Questions answered.  Verb understanding, agreeable to d/c home with outpt f/u.        Laray Anger, DO 11/15/12 [phone]

## 2012-11-12 NOTE — ED Notes (Addendum)
Sick 2 days with fever,sob, vomited x 1 this am.diarrhea for 2 days  Cough with yellow sputum

## 2012-11-13 LAB — URINE CULTURE
Colony Count: NO GROWTH
Culture: NO GROWTH

## 2013-08-02 ENCOUNTER — Encounter (HOSPITAL_COMMUNITY): Payer: Self-pay | Admitting: Emergency Medicine

## 2013-08-02 ENCOUNTER — Emergency Department (HOSPITAL_COMMUNITY)
Admission: EM | Admit: 2013-08-02 | Discharge: 2013-08-02 | Disposition: A | Discharge status: Home / Self Care | Payer: Medicaid Other | Attending: Emergency Medicine | Admitting: Emergency Medicine

## 2013-08-02 DIAGNOSIS — G40909 Epilepsy, unspecified, not intractable, without status epilepticus: Secondary | ICD-10-CM | POA: Insufficient documentation

## 2013-08-02 DIAGNOSIS — Z8679 Personal history of other diseases of the circulatory system: Secondary | ICD-10-CM | POA: Insufficient documentation

## 2013-08-02 DIAGNOSIS — Z8719 Personal history of other diseases of the digestive system: Secondary | ICD-10-CM | POA: Insufficient documentation

## 2013-08-02 DIAGNOSIS — J329 Chronic sinusitis, unspecified: Secondary | ICD-10-CM

## 2013-08-02 DIAGNOSIS — F319 Bipolar disorder, unspecified: Secondary | ICD-10-CM | POA: Insufficient documentation

## 2013-08-02 DIAGNOSIS — R52 Pain, unspecified: Secondary | ICD-10-CM | POA: Insufficient documentation

## 2013-08-02 DIAGNOSIS — J40 Bronchitis, not specified as acute or chronic: Secondary | ICD-10-CM

## 2013-08-02 DIAGNOSIS — J441 Chronic obstructive pulmonary disease with (acute) exacerbation: Secondary | ICD-10-CM | POA: Insufficient documentation

## 2013-08-02 DIAGNOSIS — Z79899 Other long term (current) drug therapy: Secondary | ICD-10-CM | POA: Insufficient documentation

## 2013-08-02 DIAGNOSIS — F411 Generalized anxiety disorder: Secondary | ICD-10-CM | POA: Insufficient documentation

## 2013-08-02 DIAGNOSIS — F17200 Nicotine dependence, unspecified, uncomplicated: Secondary | ICD-10-CM | POA: Insufficient documentation

## 2013-08-02 HISTORY — DX: Chronic obstructive pulmonary disease, unspecified: J44.9

## 2013-08-02 MED ORDER — PREDNISONE 10 MG PO TABS: ORAL_TABLET | ORAL | Status: DC

## 2013-08-02 MED ORDER — DOXYCYCLINE HYCLATE 100 MG PO CAPS: 100.0000 mg | ORAL_CAPSULE | Freq: Two times a day (BID) | ORAL | Status: AC

## 2013-08-02 MED ORDER — PROMETHAZINE-DM 6.25-15 MG/5ML PO SYRP: 5.0000 mL | ORAL_SOLUTION | Freq: Four times a day (QID) | ORAL | Status: DC | PRN

## 2013-08-02 NOTE — ED Provider Notes (Signed)
Medical screening examination/treatment/procedure(s) were conducted as a shared visit with non-physician practitioner(s) and myself.  I personally evaluated the patient during the encounter  Patient feels much better at time of discharge.  Suspect bronchitis.  Home with a short course of Doxy and prednisone taper.        Lyanne Co, MD 08/02/13 [phone]

## 2013-08-02 NOTE — ED Notes (Signed)
Patient c/o generalized body aches and coughing. Per patient productive cough with thick yellow sputum x2-3 days. Patient denies any fevers. Patient's husband treated here yesterday for bronchitis.

## 2013-08-02 NOTE — ED Notes (Signed)
Patient with no complaints at this time. Respirations even and unlabored. Skin warm/dry. Discharge instructions reviewed with patient at this time. Patient given opportunity to voice concerns/ask questions. Patient discharged at this time and left Emergency Department with steady gait.   

## 2013-08-02 NOTE — ED Provider Notes (Signed)
CSN: [id number]     Arrival date & time 08/02/13  0808 History   First MD Initiated Contact with Patient 08/02/13 [phone]     Chief Complaint  Patient presents with  . Generalized Body Aches  . Cough   (Consider location/radiation/quality/duration/timing/severity/associated sxs/prior Treatment) Patient is a 62 y.o. female presenting with cough. The history is provided by the patient.  Cough Cough characteristics:  Productive Sputum characteristics:  Yellow Severity:  Moderate Onset quality:  Gradual Duration:  3 days Timing:  Intermittent Progression:  Worsening Chronicity:  New Smoker: yes   Context: sick contacts and weather changes   Relieved by:  Nothing Worsened by:  Nothing tried Associated symptoms: chills, diaphoresis, myalgias, sinus congestion and wheezing   Associated symptoms: no fever   Risk factors: no recent travel     Past Medical History  Diagnosis Date  . Seizures   . IBS (irritable bowel syndrome)   . Bipolar disorder   . Anxiety   . Gastric ulcer   . Hemorrhoids   . Alcohol abuse, in remission   . COPD (chronic obstructive pulmonary disease)    Past Surgical History  Procedure Laterality Date  . Cholecystectomy    . Abdominal hysterectomy    . Eye surgery     History reviewed. No pertinent family history. History  Substance Use Topics  . Smoking status: Current Every Day Smoker -- 0.50 packs/day for 32 years    Types: Cigarettes  . Smokeless tobacco: Never Used  . Alcohol Use: No   OB History   Grav Para Term Preterm Abortions TAB SAB Ect Mult Living   4 4 4       3      Review of Systems  Constitutional: Positive for chills and diaphoresis. Negative for fever.  Respiratory: Positive for cough and wheezing.   Musculoskeletal: Positive for myalgias.  Neurological: Positive for seizures.  Psychiatric/Behavioral: The patient is nervous/anxious.     Allergies  Flu virus vaccine; Lithium; and Varenicline tartrate  Home Medications    Current Outpatient Rx  Name  Route  Sig  Dispense  Refill  . albuterol (PROVENTIL HFA;VENTOLIN HFA) 108 (90 BASE) MCG/ACT inhaler   Inhalation   Inhale 2 puffs into the lungs every 6 (six) hours as needed. For shortness of breath         . albuterol (PROVENTIL) (2.5 MG/3ML) 0.083% nebulizer solution   Nebulization   Take 2.5 mg by nebulization every 6 (six) hours as needed. For shortness of breath         . ALPRAZolam (XANAX) 1 MG tablet   Oral   Take 0.5 mg by mouth at bedtime as needed. Sleep.         . BENTYL 10 MG capsule      TAKE 1 CAPSULE BY MOUTH  30 MINUTES BEFOREBREAKFAST AND AT BEDTIME AS DIRECTED.   60 each   5   . benzonatate (TESSALON) 100 MG capsule   Oral   Take 1 capsule (100 mg total) by mouth 3 (three) times daily as needed for cough.   15 capsule   0   . clonazePAM (KLONOPIN) 1 MG tablet   Oral   Take 1 mg by mouth 3 (three) times daily.          Marland Kitchen HYDROcodone-acetaminophen (NORCO) 10-325 MG per tablet   Oral   Take 1 tablet by mouth every 6 (six) hours as needed. Pain.         . ondansetron (ZOFRAN) 4  MG tablet   Oral   Take 1 tablet (4 mg total) by mouth every 8 (eight) hours as needed for nausea.   6 tablet   0   . pantoprazole (PROTONIX) 40 MG tablet   Oral   Take 40 mg by mouth 2 (two) times daily.           . phenytoin (DILANTIN) 100 MG ER capsule   Oral   Take 400 mg by mouth at bedtime.          . Pseudoeph-Doxylamine-DM-APAP (NYQUIL D COLD/FLU) 60-12.03-27-999 MG/30ML LIQD   Oral   Take 30 mLs by mouth every 4 (four) hours as needed. Flu symptoms.          BP 142/70  Pulse 83  Temp(Src) 98.7 F (37.1 C) (Oral)  Resp 20  Ht 5\' 4"  (1.626 m)  Wt 142 lb (64.411 kg)  BMI 24.36 kg/m2  SpO2 96% Physical Exam  Nursing note and vitals reviewed. Constitutional: She is oriented to person, place, and time. She appears well-developed and well-nourished.  Non-toxic appearance.  HENT:  Head: Normocephalic.  Right  Ear: Tympanic membrane and external ear normal.  Left Ear: Tympanic membrane and external ear normal.  Nasal congestion  Eyes: EOM and lids are normal. Pupils are equal, round, and reactive to light.  Neck: Normal range of motion. Neck supple. Carotid bruit is not present.  Cardiovascular: Normal rate, regular rhythm, normal heart sounds, intact distal pulses and normal pulses.   Pulmonary/Chest: Breath sounds normal. No respiratory distress.  Course breath sounds with occasional wheeze  Abdominal: Soft. Bowel sounds are normal. There is no tenderness. There is no guarding.  Musculoskeletal: Normal range of motion.  Degenerative changes or multiple joints. No hot joints  Lymphadenopathy:       Head (right side): No submandibular adenopathy present.       Head (left side): No submandibular adenopathy present.    She has no cervical adenopathy.  Neurological: She is alert and oriented to person, place, and time. She has normal strength. No cranial nerve deficit or sensory deficit.  Skin: Skin is warm and dry.  Psychiatric: She has a normal mood and affect. Her speech is normal.    ED Course  Procedures (including critical care time) Labs Review Labs Reviewed - No data to display Imaging Review No results found.  MDM  No diagnosis found. **I have reviewed nursing notes, vital signs, and all appropriate lab and imaging results for this patient.*  PUlse ox 96% on room air. WNL by my interpretation. Pt able to speak in complete sentences. Suspect bronchitis and sinusitis (smoker). Rx for doxycycline, prednisone taper, and promethazine cough med ordered for pt. She will follow up with Dr Sudie Bailey in the office.   Kathie Dike, PA-C 08/02/13 [phone]

## 2013-11-29 ENCOUNTER — Encounter (HOSPITAL_COMMUNITY): Payer: Self-pay | Admitting: Emergency Medicine

## 2013-11-29 ENCOUNTER — Emergency Department (HOSPITAL_COMMUNITY): Payer: BC Managed Care – PPO

## 2013-11-29 ENCOUNTER — Emergency Department (HOSPITAL_COMMUNITY)
Admission: EM | Admit: 2013-11-29 | Discharge: 2013-11-29 | Disposition: A | Discharge status: Home / Self Care | Payer: BC Managed Care – PPO | Attending: Emergency Medicine | Admitting: Emergency Medicine

## 2013-11-29 DIAGNOSIS — Z8679 Personal history of other diseases of the circulatory system: Secondary | ICD-10-CM | POA: Insufficient documentation

## 2013-11-29 DIAGNOSIS — J449 Chronic obstructive pulmonary disease, unspecified: Secondary | ICD-10-CM | POA: Insufficient documentation

## 2013-11-29 DIAGNOSIS — Z79899 Other long term (current) drug therapy: Secondary | ICD-10-CM | POA: Insufficient documentation

## 2013-11-29 DIAGNOSIS — R110 Nausea: Secondary | ICD-10-CM | POA: Insufficient documentation

## 2013-11-29 DIAGNOSIS — J4489 Other specified chronic obstructive pulmonary disease: Secondary | ICD-10-CM | POA: Insufficient documentation

## 2013-11-29 DIAGNOSIS — Z8719 Personal history of other diseases of the digestive system: Secondary | ICD-10-CM | POA: Insufficient documentation

## 2013-11-29 DIAGNOSIS — F319 Bipolar disorder, unspecified: Secondary | ICD-10-CM | POA: Insufficient documentation

## 2013-11-29 DIAGNOSIS — F17200 Nicotine dependence, unspecified, uncomplicated: Secondary | ICD-10-CM | POA: Insufficient documentation

## 2013-11-29 DIAGNOSIS — S0990XA Unspecified injury of head, initial encounter: Secondary | ICD-10-CM | POA: Insufficient documentation

## 2013-11-29 DIAGNOSIS — S20229A Contusion of unspecified back wall of thorax, initial encounter: Secondary | ICD-10-CM

## 2013-11-29 DIAGNOSIS — F411 Generalized anxiety disorder: Secondary | ICD-10-CM | POA: Insufficient documentation

## 2013-11-29 DIAGNOSIS — G40909 Epilepsy, unspecified, not intractable, without status epilepticus: Secondary | ICD-10-CM | POA: Insufficient documentation

## 2013-11-29 MED ORDER — ONDANSETRON 8 MG PO TBDP
8.0000 mg | ORAL_TABLET | Freq: Once | ORAL | Status: AC
  Administered 2013-11-29: 8 mg via ORAL
  Filled 2013-11-29: qty 1

## 2013-11-29 MED ORDER — ONDANSETRON HCL 4 MG PO TABS: 4.0000 mg | ORAL_TABLET | Freq: Three times a day (TID) | ORAL | Status: DC | PRN

## 2013-11-29 NOTE — Discharge Instructions (Signed)
Contusion A contusion is a deep bruise. Contusions happen when an injury causes bleeding under the skin. Signs of bruising include pain, puffiness (swelling), and discolored skin. The contusion may turn blue, purple, or yellow. HOME CARE   Put ice on the injured area.  Put ice in a plastic bag.  Place a towel between your skin and the bag.  Leave the ice on for 15-20 minutes, 03-04 times a day.  Only take medicine as told by your doctor.  Rest the injured area.  If possible, raise (elevate) the injured area to lessen puffiness. GET HELP RIGHT AWAY IF:   You have more bruising or puffiness.  You have pain that is getting worse.  Your puffiness or pain is not helped by medicine. MAKE SURE YOU:   Understand these instructions.  Will watch your condition.  Will get help right away if you are not doing well or get worse. Document Released: 04/02/2008 Document Revised: 01/07/2012 Document Reviewed: 08/20/2011 [facility] Patient Information 2014 [place], [place].  Head Injury, Adult You have a head injury. Headaches and throwing up (vomiting) are common after a head injury. It should be easy to wake up from sleeping. Sometimes you must stay in the hospital. Most problems happen within the first 24 hours. Side effects may occur up to 7 10 days after the injury.  WHAT ARE THE TYPES OF HEAD INJURIES? Head injuries can be as minor as a bump. Some head injuries can be more severe. More severe head injuries include:  A jarring injury to the brain (concussion).  A bruise of the brain (contusion). This mean there is bleeding in the brain that can cause swelling.  A cracked skull (skull fracture).  Bleeding in the brain that collects, clots, and forms a bump (hematoma). . WHEN SHOULD I GET HELP RIGHT AWAY?   You are confused or sleepy.  You cannot be woken up.  You feel sick to your stomach (nauseous) or keep throwing up.  Your dizziness or unsteadiness is get worse.  You have  very bad, lasting headaches that are not helped by medicine.  You cannot use your arms or legs like normal  You cannot walk.  You notice changes in the black spots in the center of the colored part of your eye (pupil).  You have clear or bloody fluid coming from your nose or ears.  You have trouble seeing. During the next 24 hours after the injury, you must stay with someone who can watch you. This person should get help right away (call 911 in the [place] if you start to shake and are not able to control it (seizures), you become pass out, or you are unable to wake up. HOW CAN I PREVENT A HEAD INJURY IN THE FUTURE?  Wear seat belts.  Wear helmets while bike riding and playing sports like football.  Stay away from dangerous activities around the house. WHEN CAN I RETURN TO NORMAL ACTIVITIES AND ATHLETICS? See your doctor before doing these activities. You should not do normal activities or play contact sports until 1 week after the following symptoms have stopped:  Headache that does not go away.  Dizziness.  Poor attention.  Confusion.  Memory problems.  Sickness to your stomach or throwing up.  Tiredness.  Fussiness.  Bothered by bright lights or loud noises.  Anxiousness or depression.  Restless sleep. MAKE SURE YOU:   Understand these instructions.  Will watch your condition.  Will get help right away if you are not doing  well or get worse. Document Released: 09/27/2008 Document Revised: 08/05/2013 Document Reviewed: 06/22/2013 [facility] Patient Information 2014 [place].
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
## 2013-12-02 NOTE — ED Provider Notes (Signed)
  Medical screening examination/treatment/procedure(s) were performed by non-physician practitioner and as supervising physician I was immediately available for consultation/collaboration.   Carmin Muskrat, MD 12/02/13 [phone]

## 2014-08-30 ENCOUNTER — Encounter (HOSPITAL_COMMUNITY): Payer: Self-pay | Admitting: Emergency Medicine

## 2014-10-06 ENCOUNTER — Other Ambulatory Visit (HOSPITAL_COMMUNITY): Payer: Self-pay | Admitting: Family Medicine

## 2014-10-06 ENCOUNTER — Ambulatory Visit (HOSPITAL_COMMUNITY)
Admission: RE | Admit: 2014-10-06 | Discharge: 2014-10-06 | Disposition: A | Discharge status: Home / Self Care | Payer: BC Managed Care – PPO | Source: Ambulatory Visit | Attending: Family Medicine | Admitting: Family Medicine

## 2014-10-06 DIAGNOSIS — R634 Abnormal weight loss: Secondary | ICD-10-CM | POA: Diagnosis not present

## 2014-10-06 DIAGNOSIS — J45909 Unspecified asthma, uncomplicated: Secondary | ICD-10-CM | POA: Diagnosis not present

## 2014-10-06 DIAGNOSIS — R110 Nausea: Secondary | ICD-10-CM | POA: Insufficient documentation

## 2014-10-06 DIAGNOSIS — F17210 Nicotine dependence, cigarettes, uncomplicated: Secondary | ICD-10-CM | POA: Diagnosis not present

## 2015-02-19 ENCOUNTER — Encounter (HOSPITAL_COMMUNITY): Payer: Self-pay | Admitting: Emergency Medicine

## 2015-02-19 ENCOUNTER — Emergency Department (HOSPITAL_COMMUNITY): Payer: 59

## 2015-02-19 ENCOUNTER — Emergency Department (HOSPITAL_COMMUNITY)
Admission: EM | Admit: 2015-02-19 | Discharge: 2015-02-19 | Disposition: A | Discharge status: Home / Self Care | Payer: 59 | Attending: Emergency Medicine | Admitting: Emergency Medicine

## 2015-02-19 DIAGNOSIS — G40909 Epilepsy, unspecified, not intractable, without status epilepticus: Secondary | ICD-10-CM | POA: Diagnosis not present

## 2015-02-19 DIAGNOSIS — J40 Bronchitis, not specified as acute or chronic: Secondary | ICD-10-CM

## 2015-02-19 DIAGNOSIS — F319 Bipolar disorder, unspecified: Secondary | ICD-10-CM | POA: Insufficient documentation

## 2015-02-19 DIAGNOSIS — F419 Anxiety disorder, unspecified: Secondary | ICD-10-CM | POA: Diagnosis not present

## 2015-02-19 DIAGNOSIS — J209 Acute bronchitis, unspecified: Secondary | ICD-10-CM | POA: Insufficient documentation

## 2015-02-19 DIAGNOSIS — R05 Cough: Secondary | ICD-10-CM | POA: Diagnosis present

## 2015-02-19 DIAGNOSIS — R0602 Shortness of breath: Secondary | ICD-10-CM

## 2015-02-19 DIAGNOSIS — Z8719 Personal history of other diseases of the digestive system: Secondary | ICD-10-CM | POA: Insufficient documentation

## 2015-02-19 DIAGNOSIS — Z79899 Other long term (current) drug therapy: Secondary | ICD-10-CM | POA: Insufficient documentation

## 2015-02-19 DIAGNOSIS — Z720 Tobacco use: Secondary | ICD-10-CM | POA: Insufficient documentation

## 2015-02-19 MED ORDER — PREDNISONE 20 MG PO TABS: 40.0000 mg | ORAL_TABLET | Freq: Every day | ORAL | Status: DC

## 2015-02-19 MED ORDER — BENZONATATE 100 MG PO CAPS: 100.0000 mg | ORAL_CAPSULE | Freq: Three times a day (TID) | ORAL | Status: DC

## 2015-02-19 MED ORDER — ALBUTEROL SULFATE HFA 108 (90 BASE) MCG/ACT IN AERS: 2.0000 | INHALATION_SPRAY | RESPIRATORY_TRACT | Status: DC | PRN

## 2015-02-19 MED ORDER — ALBUTEROL SULFATE HFA 108 (90 BASE) MCG/ACT IN AERS
2.0000 | INHALATION_SPRAY | Freq: Once | RESPIRATORY_TRACT | Status: AC
  Administered 2015-02-19: 2 via RESPIRATORY_TRACT
  Filled 2015-02-19: qty 6.7

## 2015-02-19 MED ORDER — AZITHROMYCIN 250 MG PO TABS: 250.0000 mg | ORAL_TABLET | Freq: Every day | ORAL | Status: DC

## 2015-02-19 NOTE — ED Provider Notes (Signed)
CSN: [id number]     Arrival date & time 02/19/15  49 History   [name] 02/19/15 1057     Chief Complaint  Jefferson presents with  . Cough   Jefferson is a 64 y.o. female presenting with cough. The history is provided by the Jefferson. No language interpreter was used.  Cough Associated symptoms: chills and myalgias   Associated symptoms: no fever    This chart was scribed for Sabrina Chapel, MD by Thea Alken, ED Scribe. This Jefferson was seen in room APA17/APA17 and the Jefferson's care was started at 12:01 PM.  HPI Comments:  [name] is a 64 y.o. female who present to the Emergency Department complaining of a productive cough with thick gray mucous for the past week. Pt reports [name] has been unable to sleep for the past week due to cough. [name] reports associates body aches and some chills.[name] reports hx of asthma and COPD and states this cough is worse than usual. Pt shares an inhaler with [name] husband. [name] denies fever, diarrhea, difficulty urinating, leg swelling. [name] denies seasonal allergies.   Sx are persistent, nothing makes better, worse with laying down-  No associated f/n/v/d/ or swelling of legs.  Past Medical History  Diagnosis Date  . Seizures   . IBS (irritable bowel syndrome)   . Bipolar disorder   . Anxiety   . Gastric ulcer   . Hemorrhoids   . Alcohol abuse, in remission   . COPD (chronic obstructive pulmonary disease)    Past Surgical History  Procedure Laterality Date  . Cholecystectomy    . Abdominal hysterectomy    . Eye surgery     History reviewed. No pertinent family history. History  Substance Use Topics  . Smoking status: Current Every Day Smoker -- 0.50 packs/day for 32 years    Types: Cigarettes  . Smokeless tobacco: Never Used  . Alcohol Use: No   OB History    Gravida Para Term Preterm AB TAB SAB Ectopic Multiple Living   4 4 4       3      Review of Systems  Constitutional: Positive for chills. Negative for  fever.  Respiratory: Positive for cough.   Cardiovascular: Negative for leg swelling.  Gastrointestinal: Negative for diarrhea.  Genitourinary: Negative for difficulty urinating.  Musculoskeletal: Positive for myalgias.  Allergic/Immunologic: Negative for environmental allergies.  All other systems reviewed and are negative.   Allergies  Flu virus vaccine; Lithium; and Varenicline tartrate  Home Medications   Prior to Admission medications   Medication Sig Start Date End Date Taking? Authorizing Provider  acetaminophen (TYLENOL) 500 MG tablet Take 1,000 mg by mouth 2 (two) times daily as needed for moderate pain.   Yes Historical Provider, MD  albuterol (PROVENTIL HFA;VENTOLIN HFA) 108 (90 BASE) MCG/ACT inhaler Inhale 2 puffs into the lungs every 6 (six) hours as needed. For shortness of breath   Yes Historical Provider, MD  albuterol (PROVENTIL) (2.5 MG/3ML) 0.083% nebulizer solution Take 2.5 mg by nebulization every 6 (six) hours as needed. For shortness of breath   Yes Historical Provider, MD  BENTYL 10 MG capsule TAKE 1 CAPSULE BY MOUTH  30 MINUTES BEFOREBREAKFAST AND AT BEDTIME AS DIRECTED. 07/03/11  Yes Mahala Menghini, PA-C  clonazePAM (KLONOPIN) 1 MG tablet Take 1 mg by mouth 3 (three) times daily.    Yes Historical Provider, MD  pantoprazole (PROTONIX) 40 MG tablet Take 40 mg by mouth 2 (two) times daily.  Yes Historical Provider, MD  phenytoin (DILANTIN) 100 MG ER capsule Take 400 mg by mouth at bedtime.    Yes Historical Provider, MD  albuterol (PROVENTIL HFA;VENTOLIN HFA) 108 (90 BASE) MCG/ACT inhaler Inhale 2 puffs into the lungs every 4 (four) hours as needed for wheezing or shortness of breath. 02/19/15   Sabrina Chapel, MD  azithromycin (ZITHROMAX Z-PAK) 250 MG tablet Take 1 tablet (250 mg total) by mouth daily. 500mg  PO day 1, then 250mg  PO days 205 02/19/15   Sabrina Chapel, MD  benzonatate (TESSALON) 100 MG capsule Take 1 capsule (100 mg total) by mouth every 8 (eight) hours.  02/19/15   Sabrina Chapel, MD  ondansetron (ZOFRAN) 4 MG tablet Take 1 tablet (4 mg total) by mouth every 8 (eight) hours as needed for nausea or vomiting. Jefferson not taking: Reported on 02/19/2015 11/29/13   Evalee Jefferson, PA-C  predniSONE (DELTASONE) 20 MG tablet Take 2 tablets (40 mg total) by mouth daily. 02/19/15   Sabrina Chapel, MD   BP 135/71 mmHg  Pulse 89  Temp(Src) 99.5 F (37.5 C) (Oral)  Resp 18  Ht 5\' 3"  (1.6 m)  Wt 126 lb (57.153 kg)  BMI 22.33 kg/m2  SpO2 100% Physical Exam  Constitutional: [name] appears well-developed and well-nourished. No distress.  HENT:  Head: Normocephalic and atraumatic.  Mouth/Throat: Oropharynx is clear and moist. No oropharyngeal exudate.  Eyes: Conjunctivae and EOM are normal. Pupils are equal, round, and reactive to light. Right eye exhibits no discharge. Left eye exhibits no discharge. No scleral icterus.  Neck: Normal range of motion. Neck supple. No JVD present. No thyromegaly present.  Cardiovascular: Normal rate, regular rhythm, normal heart sounds and intact distal pulses.  Exam reveals no gallop and no friction rub.   No murmur heard. Pulmonary/Chest: Effort normal and breath sounds normal. No respiratory distress. [name] has no wheezes. [name] has no rales.  subtle rales at the bases that clear with deep breathing  Abdominal: Soft. Bowel sounds are normal. [name] exhibits no distension and no mass. There is no tenderness.  Musculoskeletal: Normal range of motion. [name] exhibits no edema or tenderness.  Lymphadenopathy:    [name] has no cervical adenopathy.  Neurological: [name] is alert. Coordination normal.  Skin: Skin is warm and dry. No rash noted. [name] is not diaphoretic. No erythema.  Psychiatric: [name] has a normal mood and affect. [name] behavior is normal.  Nursing note and vitals reviewed.   ED Course  Procedures (including critical care time) DIAGNOSTIC STUDIES: Oxygen Saturation is 100% on RA, normal by my interpretation.    COORDINATION OF  CARE: 12:01 PM- Pt advised of plan for treatment and pt agrees.'  Labs Review Labs Reviewed - No data to display  Imaging Review No results found.    MDM   Final diagnoses:  Bronchitis    The Jefferson is very well-appearing, [name] has no increased work of breathing, [name] vital signs are totally normal, [name] chest x-ray reveals no signs of infiltrates, pneumothorax or any other problems. Due to [name] underlying lung disease with COPD and asthma and [name] hyperexpansion on the x-ray I will treat [name] with an antibiotic, albuterol treatments for home, course of prednisone and an antitussive medication. [name] is in agreement with the plan and will follow-up closely as needed.  Meds given in ED:  Medications  albuterol (PROVENTIL HFA;VENTOLIN HFA) 108 (90 BASE) MCG/ACT inhaler 2 puff (not administered)    New Prescriptions   ALBUTEROL (PROVENTIL HFA;VENTOLIN HFA) 108 (90 BASE) MCG/ACT  INHALER    Inhale 2 puffs into the lungs every 4 (four) hours as needed for wheezing or shortness of breath.   AZITHROMYCIN (ZITHROMAX Z-PAK) 250 MG TABLET    Take 1 tablet (250 mg total) by mouth daily. 500mg  PO day 1, then 250mg  PO days 205   BENZONATATE (TESSALON) 100 MG CAPSULE    Take 1 capsule (100 mg total) by mouth every 8 (eight) hours.   PREDNISONE (DELTASONE) 20 MG TABLET    Take 2 tablets (40 mg total) by mouth daily.    I personally performed the services described in this documentation, which was scribed in my presence. The recorded information has been reviewed and is accurate.       Sabrina Chapel, MD 02/19/15 [id number]

## 2015-02-19 NOTE — Discharge Instructions (Signed)
Please call your doctor for a followup appointment within 24-48 hours. When you talk to your doctor please let them know that you were seen in the emergency department and have them acquire all of your records so that they can discuss the findings with you and formulate a treatment plan to fully care for your new and ongoing problems. ° °

## 2015-02-19 NOTE — ED Notes (Signed)
RT called

## 2015-02-19 NOTE — ED Notes (Signed)
MD at bedside. 

## 2015-02-19 NOTE — ED Notes (Signed)
Pt reports productive cough, generalized weakness x1 week. Pt denies any known fever.

## 2015-09-29 ENCOUNTER — Emergency Department (HOSPITAL_COMMUNITY): Payer: Medicare Other

## 2015-09-29 ENCOUNTER — Encounter (HOSPITAL_COMMUNITY): Payer: Self-pay | Admitting: Emergency Medicine

## 2015-09-29 ENCOUNTER — Emergency Department (HOSPITAL_COMMUNITY)
Admission: EM | Admit: 2015-09-29 | Discharge: 2015-09-29 | Discharge status: Against Medical Advice | Payer: Medicare Other | Attending: Emergency Medicine | Admitting: Emergency Medicine

## 2015-09-29 DIAGNOSIS — Z79899 Other long term (current) drug therapy: Secondary | ICD-10-CM | POA: Diagnosis not present

## 2015-09-29 DIAGNOSIS — F319 Bipolar disorder, unspecified: Secondary | ICD-10-CM | POA: Diagnosis not present

## 2015-09-29 DIAGNOSIS — Z8719 Personal history of other diseases of the digestive system: Secondary | ICD-10-CM | POA: Diagnosis not present

## 2015-09-29 DIAGNOSIS — F419 Anxiety disorder, unspecified: Secondary | ICD-10-CM | POA: Insufficient documentation

## 2015-09-29 DIAGNOSIS — J441 Chronic obstructive pulmonary disease with (acute) exacerbation: Secondary | ICD-10-CM | POA: Insufficient documentation

## 2015-09-29 DIAGNOSIS — F17210 Nicotine dependence, cigarettes, uncomplicated: Secondary | ICD-10-CM | POA: Insufficient documentation

## 2015-09-29 DIAGNOSIS — R079 Chest pain, unspecified: Secondary | ICD-10-CM | POA: Diagnosis present

## 2015-09-29 LAB — BRAIN NATRIURETIC PEPTIDE: B Natriuretic Peptide: 21 pg/mL (ref 0.0–100.0)

## 2015-09-29 LAB — COMPREHENSIVE METABOLIC PANEL
ALK PHOS: 121 U/L (ref 38–126)
ALT: 17 U/L (ref 14–54)
ANION GAP: 7 (ref 5–15)
AST: 17 U/L (ref 15–41)
Albumin: 3.8 g/dL (ref 3.5–5.0)
BILIRUBIN TOTAL: 0.4 mg/dL (ref 0.3–1.2)
BUN: 11 mg/dL (ref 6–20)
CALCIUM: 8.8 mg/dL — AB (ref 8.9–10.3)
CO2: 26 mmol/L (ref 22–32)
Chloride: 104 mmol/L (ref 101–111)
Creatinine, Ser: 0.88 mg/dL (ref 0.44–1.00)
GFR calc non Af Amer: >60 mL/min (ref >60)
Glucose, Bld: 114 mg/dL — ABNORMAL HIGH (ref 65–99)
Potassium: 4.2 mmol/L (ref 3.5–5.1)
SODIUM: 137 mmol/L (ref 135–145)
TOTAL PROTEIN: 7.1 g/dL (ref 6.5–8.1)

## 2015-09-29 LAB — CBC
HEMATOCRIT: 37.1 % (ref 36.0–46.0)
Hemoglobin: 12.5 g/dL (ref 12.0–15.0)
MCH: 31.3 pg (ref 26.0–34.0)
MCHC: 33.7 g/dL (ref 30.0–36.0)
MCV: 92.8 fL (ref 78.0–100.0)
PLATELETS: 277 10*3/uL (ref 150–400)
RBC: 4 MIL/uL (ref 3.87–5.11)
RDW: 12.9 % (ref 11.5–15.5)
WBC: 7.2 10*3/uL (ref 4.0–10.5)

## 2015-09-29 LAB — TROPONIN I: Troponin I: <0.03 ng/mL (ref <0.031)

## 2015-09-29 MED ORDER — IPRATROPIUM BROMIDE 0.02 % IN SOLN
0.5000 mg | Freq: Once | RESPIRATORY_TRACT | Status: AC
  Administered 2015-09-29: 0.5 mg via RESPIRATORY_TRACT
  Filled 2015-09-29: qty 2.5

## 2015-09-29 MED ORDER — METHYLPREDNISOLONE SODIUM SUCC 125 MG IJ SOLR
125.0000 mg | Freq: Once | INTRAMUSCULAR | Status: AC
  Administered 2015-09-29: 125 mg via INTRAVENOUS
  Filled 2015-09-29: qty 2

## 2015-09-29 MED ORDER — NITROGLYCERIN 2 % TD OINT
0.5000 [in_us] | TOPICAL_OINTMENT | Freq: Once | TRANSDERMAL | Status: AC
  Administered 2015-09-29: 0.5 [in_us] via TOPICAL
  Filled 2015-09-29: qty 1

## 2015-09-29 MED ORDER — ALBUTEROL (5 MG/ML) CONTINUOUS INHALATION SOLN
15.0000 mg/h | INHALATION_SOLUTION | Freq: Once | RESPIRATORY_TRACT | Status: AC
  Administered 2015-09-29: 15 mg/h via RESPIRATORY_TRACT
  Filled 2015-09-29: qty 20

## 2015-09-29 MED ORDER — DM-GUAIFENESIN ER 30-600 MG PO TB12
1.0000 | ORAL_TABLET | Freq: Two times a day (BID) | ORAL | Status: DC
  Filled 2015-09-29: qty 1

## 2015-09-29 NOTE — ED Notes (Signed)
Pt is leaving AMA.  Pt asked for medication for headache and said that she is allergic to tylenol and motrin.  Pt requested oxycodone or hydrocodone.

## 2015-09-29 NOTE — ED Notes (Signed)
Respiratory at bedside.

## 2015-09-29 NOTE — ED Notes (Signed)
Dr Tomi Bamberger informed that pt is requesting pain medication for headache and that pt says she is allergic to tylenol and motrin.  Verbal order given to give tylenol 650 mg pt wants it.  Pt says she did not want it and said she wanted to be discharged home.  Dr Tomi Bamberger says pt can sign out AMA.

## 2015-09-29 NOTE — ED Notes (Signed)
Pt c/o cough and chest pain that has gotten worse since last night.

## 2015-09-29 NOTE — ED Provider Notes (Addendum)
CSN: [id number]     Arrival date & time 09/29/15  0533 History   First MD Initiated Contact with Patient 09/29/15 [phone]   Chief Complaint  Patient presents with  . Chest Pain     (Consider location/radiation/quality/duration/timing/severity/associated sxs/prior Treatment) HPI patient states "I feel terrible". When asked what that means she states her body hurts her head hurts in her chest hurts. She states it started on November 28. She is unsure of fever but did start having chills yesterday. She has cough with yellow and green mucus production. She has clear rhinorrhea. She has mild sore throat. She has had nausea without vomiting. She denies diarrhea but has diffuse abdominal pain which she states she's had before. She describes it as nagging and states she had it with ulcers. She also states she's been having chest pressure and indicates her anterior chest for the past few days. She states it comes and goes and lasts about an hour. Nothing seems to make it feel worse, nothing makes it feel better. She states she's currently having chest pain that started about an hour prior to arrival. She does not take any medicine for it. She states she's never had it before. She states she has been having wheezing and when she uses her nebulizer at home or inhaler it helps for short while. She is not on oxygen at home. She states she cannot take the flu shot or the pneumonia shot dues to some allergies. She states she is trying to quit smoking.   PCP Dr Karie Kirks  Past Medical History  Diagnosis Date  . Seizures ([place])   . IBS (irritable bowel syndrome)   . Bipolar disorder ([place])   . Anxiety   . Gastric ulcer   . Hemorrhoids   . Alcohol abuse, in remission   . COPD (chronic obstructive pulmonary disease) [facility])    Past Surgical History  Procedure Laterality Date  . Cholecystectomy    . Abdominal hysterectomy    . Eye surgery     History reviewed. No pertinent family history. Social History   Substance Use Topics  . Smoking status: Current Every Day Smoker -- 0.50 packs/day for 32 years    Types: Cigarettes  . Smokeless tobacco: Never Used  . Alcohol Use: No   Lives at home Lives with spouse who is also in the ED with similar symptoms. Patient states she used to have a severe alcohol problem however she is now sober.  OB History    Gravida Para Term Preterm AB TAB SAB Ectopic Multiple Living   4 4 4       3      Review of Systems  All other systems reviewed and are negative.     Allergies  Flu virus vaccine; Lithium; and Varenicline tartrate  Home Medications   Prior to Admission medications   Medication Sig Start Date End Date Taking? Authorizing Provider  acetaminophen (TYLENOL) 500 MG tablet Take 1,000 mg by mouth 2 (two) times daily as needed for moderate pain.   Yes Historical Provider, MD  albuterol (PROVENTIL HFA;VENTOLIN HFA) 108 (90 BASE) MCG/ACT inhaler Inhale 2 puffs into the lungs every 6 (six) hours as needed. For shortness of breath   Yes Historical Provider, MD  albuterol (PROVENTIL HFA;VENTOLIN HFA) 108 (90 BASE) MCG/ACT inhaler Inhale 2 puffs into the lungs every 4 (four) hours as needed for wheezing or shortness of breath. 02/19/15  Yes [name], MD  albuterol (PROVENTIL) (2.5 MG/3ML) 0.083% nebulizer solution Take 2.5 mg  by nebulization every 6 (six) hours as needed. For shortness of breath   Yes Historical Provider, MD  BENTYL 10 MG capsule TAKE 1 CAPSULE BY MOUTH  30 MINUTES BEFOREBREAKFAST AND AT BEDTIME AS DIRECTED. 07/03/11  Yes Mahala Menghini, PA-C  clonazePAM (KLONOPIN) 1 MG tablet Take 1 mg by mouth 3 (three) times daily.    Yes Historical Provider, MD  phenytoin (DILANTIN) 100 MG ER capsule Take 400 mg by mouth at bedtime.    Yes Historical Provider, MD  azithromycin (ZITHROMAX Z-PAK) 250 MG tablet Take 1 tablet (250 mg total) by mouth daily. 500mg  PO day 1, then 250mg  PO days 205 02/19/15   [name], MD  benzonatate (TESSALON) 100 MG  capsule Take 1 capsule (100 mg total) by mouth every 8 (eight) hours. 02/19/15   [name], MD  ondansetron (ZOFRAN) 4 MG tablet Take 1 tablet (4 mg total) by mouth every 8 (eight) hours as needed for nausea or vomiting. Patient not taking: Reported on 02/19/2015 11/29/13   Evalee Jefferson, PA-C  pantoprazole (PROTONIX) 40 MG tablet Take 40 mg by mouth 2 (two) times daily.      Historical Provider, MD  predniSONE (DELTASONE) 20 MG tablet Take 2 tablets (40 mg total) by mouth daily. 02/19/15   [name], MD   BP 115/69 mmHg  Pulse 63  Temp(Src) 98.1 F (36.7 C) (Oral)  Resp 15  Ht 5\' 3"  (1.6 m)  Wt 122 lb (55.339 kg)  BMI 21.62 kg/m2  SpO2 100% Physical Exam  Constitutional: She is oriented to person, place, and time. She appears well-developed and well-nourished.  Non-toxic appearance. She does not appear ill. No distress.  HENT:  Head: Normocephalic and atraumatic.  Right Ear: External ear normal.  Left Ear: External ear normal.  Nose: Nose normal. No mucosal edema or rhinorrhea.  Mouth/Throat: Oropharynx is clear and moist and mucous membranes are normal. No dental abscesses or uvula swelling.  Eyes: Conjunctivae and EOM are normal. Pupils are equal, round, and reactive to light.  Neck: Normal range of motion and full passive range of motion without pain. Neck supple.  Cardiovascular: Normal rate, regular rhythm and normal heart sounds.  Exam reveals no gallop and no friction rub.   No murmur heard. Pulmonary/Chest: Accessory muscle usage present. No respiratory distress. She has decreased breath sounds. She has no wheezes. She has no rhonchi. She has no rales. She exhibits no tenderness and no crepitus.  coughing  Abdominal: Soft. Normal appearance and bowel sounds are normal. She exhibits no distension. There is no tenderness. There is no rebound and no guarding.  Musculoskeletal: Normal range of motion. She exhibits no edema or tenderness.  Moves all extremities well.   Neurological:  She is alert and oriented to person, place, and time. She has normal strength. No cranial nerve deficit.  Skin: Skin is warm, dry and intact. No rash noted. No erythema. No pallor.  Patient has multiple skin colored nodules on her face that she states her mother also used to have. She does not appear to have them and other areas of her body.  Psychiatric: She has a normal mood and affect. Her speech is normal and behavior is normal. Her mood appears not anxious.  Nursing note and vitals reviewed.   ED Course  Procedures (including critical care time)  Medications  dextromethorphan-guaiFENesin (MUCINEX DM) 30-600 MG per 12 hr tablet 1 tablet (not administered)  albuterol (PROVENTIL,VENTOLIN) solution continuous neb (15 mg/hr Nebulization Given 09/29/15 0628)  ipratropium (ATROVENT) nebulizer solution 0.5 mg (0.5 mg Nebulization Given 09/29/15 0628)  methylPREDNISolone sodium succinate (SOLU-MEDROL) 125 mg/2 mL injection 125 mg (125 mg Intravenous Given 09/29/15 0620)  nitroGLYCERIN (NITROGLYN) 2 % ointment 0.5 inch (0.5 inches Topical Given 09/29/15 0624)   Patient's EKG did not show acute changes. She had nitroglycerin half inch placed her chest because of her borderline low blood pressure. She was given Mucinex DM for cough. She was started on a continuous nebulizer and given IV steroids.  Recheck at 6:50 AM patient's continuous nebulizer is almost finished. She indicates she is feeling better. When I listen to her lungs she has improved air movement and does not have wheezing or rhonchi. She has not had her chest x-ray done yet.  Nurse reports pt having a headache, has chronic HA since head injury many years ago, can't recall what she takes, but states she is "allergic" to acetaminophen and motrin, but not listed on medication allergy list. Advised patient could have acetaminophen.   07:18 AM nurse reports patient left AMA.   Labs Review Results for orders placed or performed during the  hospital encounter of 09/29/15  CBC  Result Value Ref Range   WBC 7.2 4.0 - 10.5 K/uL   RBC 4.00 3.87 - 5.11 MIL/uL   Hemoglobin 12.5 12.0 - 15.0 g/dL   HCT 37.1 36.0 - 46.0 %   MCV 92.8 78.0 - 100.0 fL   MCH 31.3 26.0 - 34.0 pg   MCHC 33.7 30.0 - 36.0 g/dL   RDW 12.9 11.5 - 15.5 %   Platelets 277 150 - 400 K/uL  Troponin I  Result Value Ref Range   Troponin I <0.03 <0.031 ng/mL  Comprehensive metabolic panel  Result Value Ref Range   Sodium 137 135 - 145 mmol/L   Potassium 4.2 3.5 - 5.1 mmol/L   Chloride 104 101 - 111 mmol/L   CO2 26 22 - 32 mmol/L   Glucose, Bld 114 (H) 65 - 99 mg/dL   BUN 11 6 - 20 mg/dL   Creatinine, Ser 0.88 0.44 - 1.00 mg/dL   Calcium 8.8 (L) 8.9 - 10.3 mg/dL   Total Protein 7.1 6.5 - 8.1 g/dL   Albumin 3.8 3.5 - 5.0 g/dL   AST 17 15 - 41 U/L   ALT 17 14 - 54 U/L   Alkaline Phosphatase 121 38 - 126 U/L   Total Bilirubin 0.4 0.3 - 1.2 mg/dL   GFR calc non Af Amer >60 >60 mL/min   GFR calc Af Amer >60 >60 mL/min   Anion gap 7 5 - 15  Brain natriuretic peptide  Result Value Ref Range   B Natriuretic Peptide 21.0 0.0 - 100.0 pg/mL   Laboratory interpretation all normal     Imaging Review No results found. I have personally reviewed and evaluated these images and lab results as part of my medical decision-making.   EKG Interpretation   Date/Time:  Thursday September 29 2015 05:43:22 EST Ventricular Rate:  73 PR Interval:  155 QRS Duration: 75 QT Interval:  370 QTC Calculation: 408 R Axis:   84 Text Interpretation:  Sinus rhythm Borderline right axis deviation Minimal  ST elevation, inferior leads No significant change since last tracing 08 Apr 2011 Confirmed by [facility]  MD-I, Kobyn Kray ([id number]) on 09/29/2015 5:55:51 AM      MDM   Final diagnoses:  COPD with exacerbation ([place])    Disposition pending   Rolland Porter, MD, Abram Sander  Rolland Porter, MD 09/29/15 [id number]  Rolland Porter, MD 09/30/15 [phone]

## 2016-01-15 ENCOUNTER — Emergency Department (HOSPITAL_COMMUNITY): Payer: Medicare Other

## 2016-01-15 ENCOUNTER — Emergency Department (HOSPITAL_COMMUNITY)
Admission: EM | Admit: 2016-01-15 | Discharge: 2016-01-15 | Disposition: A | Discharge status: Home / Self Care | Payer: Medicare Other | Attending: Emergency Medicine | Admitting: Emergency Medicine

## 2016-01-15 ENCOUNTER — Encounter (HOSPITAL_COMMUNITY): Payer: Self-pay | Admitting: *Deleted

## 2016-01-15 DIAGNOSIS — R0602 Shortness of breath: Secondary | ICD-10-CM | POA: Diagnosis not present

## 2016-01-15 DIAGNOSIS — R51 Headache: Secondary | ICD-10-CM | POA: Diagnosis not present

## 2016-01-15 DIAGNOSIS — R079 Chest pain, unspecified: Secondary | ICD-10-CM | POA: Diagnosis not present

## 2016-01-15 DIAGNOSIS — Z5321 Procedure and treatment not carried out due to patient leaving prior to being seen by health care provider: Secondary | ICD-10-CM | POA: Insufficient documentation

## 2016-01-15 DIAGNOSIS — R05 Cough: Secondary | ICD-10-CM | POA: Diagnosis not present

## 2016-01-15 NOTE — ED Notes (Signed)
Pt c/o mid chest pain and headache x 2-3 days. Pt reports occasional productive cough and SOB. Pt also reports being "sick to my stomach", no vomiting. Pain rated at 8/10.

## 2016-01-15 NOTE — ED Notes (Signed)
Pt not in waiting room after x2 being called from triage. Two nurses validated pt is no longer in the waiting room.

## 2016-01-17 ENCOUNTER — Emergency Department (HOSPITAL_COMMUNITY)
Admission: EM | Admit: 2016-01-17 | Discharge: 2016-01-17 | Disposition: A | Discharge status: Home / Self Care | Payer: Medicare Other | Attending: Emergency Medicine | Admitting: Emergency Medicine

## 2016-01-17 ENCOUNTER — Emergency Department (HOSPITAL_COMMUNITY): Payer: Medicare Other

## 2016-01-17 ENCOUNTER — Encounter (HOSPITAL_COMMUNITY): Payer: Self-pay | Admitting: Emergency Medicine

## 2016-01-17 DIAGNOSIS — F17210 Nicotine dependence, cigarettes, uncomplicated: Secondary | ICD-10-CM | POA: Insufficient documentation

## 2016-01-17 DIAGNOSIS — R079 Chest pain, unspecified: Secondary | ICD-10-CM | POA: Diagnosis present

## 2016-01-17 DIAGNOSIS — J441 Chronic obstructive pulmonary disease with (acute) exacerbation: Secondary | ICD-10-CM | POA: Insufficient documentation

## 2016-01-17 DIAGNOSIS — F319 Bipolar disorder, unspecified: Secondary | ICD-10-CM | POA: Diagnosis not present

## 2016-01-17 LAB — COMPREHENSIVE METABOLIC PANEL
ALT: 12 U/L — ABNORMAL LOW (ref 14–54)
AST: 14 U/L — AB (ref 15–41)
Albumin: 3.7 g/dL (ref 3.5–5.0)
Alkaline Phosphatase: 87 U/L (ref 38–126)
Anion gap: 8 (ref 5–15)
BUN: 12 mg/dL (ref 6–20)
CHLORIDE: 105 mmol/L (ref 101–111)
CO2: 26 mmol/L (ref 22–32)
Calcium: 8.9 mg/dL (ref 8.9–10.3)
Creatinine, Ser: 0.84 mg/dL (ref 0.44–1.00)
Glucose, Bld: 113 mg/dL — ABNORMAL HIGH (ref 65–99)
POTASSIUM: 4.1 mmol/L (ref 3.5–5.1)
SODIUM: 139 mmol/L (ref 135–145)
Total Bilirubin: 0.2 mg/dL — ABNORMAL LOW (ref 0.3–1.2)
Total Protein: 6.7 g/dL (ref 6.5–8.1)

## 2016-01-17 LAB — CBC
HCT: 34.4 % — ABNORMAL LOW (ref 36.0–46.0)
Hemoglobin: 11.7 g/dL — ABNORMAL LOW (ref 12.0–15.0)
MCH: 31.3 pg (ref 26.0–34.0)
MCHC: 34 g/dL (ref 30.0–36.0)
MCV: 92 fL (ref 78.0–100.0)
PLATELETS: 234 10*3/uL (ref 150–400)
RBC: 3.74 MIL/uL — AB (ref 3.87–5.11)
RDW: 13 % (ref 11.5–15.5)
WBC: 5.8 10*3/uL (ref 4.0–10.5)

## 2016-01-17 LAB — TROPONIN I

## 2016-01-17 MED ORDER — PREDNISONE 50 MG PO TABS
60.0000 mg | ORAL_TABLET | ORAL | Status: AC
  Administered 2016-01-17: 60 mg via ORAL
  Filled 2016-01-17: qty 1

## 2016-01-17 MED ORDER — ALBUTEROL (5 MG/ML) CONTINUOUS INHALATION SOLN
10.0000 mg/h | INHALATION_SOLUTION | Freq: Once | RESPIRATORY_TRACT | Status: AC
  Administered 2016-01-17: 10 mg/h via RESPIRATORY_TRACT
  Filled 2016-01-17: qty 20

## 2016-01-17 MED ORDER — PREDNISONE 20 MG PO TABS
40.0000 mg | ORAL_TABLET | Freq: Every day | ORAL | Status: AC
Start: 2016-01-18 — End: 2016-01-21

## 2016-01-17 NOTE — ED Provider Notes (Signed)
History  By signing my name below, I, Marlowe Kays, attest that this documentation has been prepared under the direction and in the presence of Carmin Muskrat, MD. Electronically Signed: Marlowe Kays, ED Scribe. 01/17/2016. 12:33 PM.  Chief Complaint  Patient presents with  . Chest Pain   The history is provided by the patient and medical records. No language interpreter was used.    HPI Comments:  [name] is a 65 y.o. female with PMHx of COPD brought in by EMS, who presents to the Emergency Department complaining of CP that has been intermittent for more than one week. She states she was seen here two days ago but left before being seen. She reports associated SOB and mild dizziness. She denies any changes in the symptoms. She reports using her nebulizer machine 3 times daily with moderate relief of the SOB. She denies modifying factors. She denies home oxygen use. She denies fever, chills, nausea, vomiting. She reports PMHx of alcohol abuse, bipolar disorder, HA and gastric ulcer. She reports she is a smoker.  Past Medical History  Diagnosis Date  . Seizures ([place])   . IBS (irritable bowel syndrome)   . Bipolar disorder ([place])   . Anxiety   . Gastric ulcer   . Hemorrhoids   . Alcohol abuse, in remission   . COPD (chronic obstructive pulmonary disease) [facility])    Past Surgical History  Procedure Laterality Date  . Cholecystectomy    . Abdominal hysterectomy    . Eye surgery     History reviewed. No pertinent family history. Social History  Substance Use Topics  . Smoking status: Current Every Day Smoker -- 0.25 packs/day for 32 years    Types: Cigarettes  . Smokeless tobacco: Never Used  . Alcohol Use: No   OB History    Gravida Para Term Preterm AB TAB SAB Ectopic Multiple Living   4 4 4       3      Review of Systems  Constitutional:       Per HPI, otherwise negative  HENT:       Per HPI, otherwise negative  Respiratory:       Per HPI, otherwise  negative  Cardiovascular:       Per HPI, otherwise negative  Gastrointestinal: Negative for vomiting.  Endocrine:       Negative aside from HPI  Genitourinary:       Neg aside from HPI   Musculoskeletal:       Per HPI, otherwise negative  Skin: Negative.   Neurological: Negative for syncope.    Allergies  Flu virus vaccine; Lithium; and Varenicline tartrate  Home Medications   Prior to Admission medications   Medication Sig Start Date End Date Taking? Authorizing Provider  BENTYL 10 MG capsule TAKE 1 CAPSULE BY MOUTH  30 MINUTES BEFOREBREAKFAST AND AT BEDTIME AS DIRECTED. 07/03/11  Yes Mahala Menghini, PA-C  clonazePAM (KLONOPIN) 1 MG tablet Take 1 mg by mouth 3 (three) times daily.    Yes Historical Provider, MD  omeprazole (PRILOSEC) 40 MG capsule Take 40 mg by mouth daily. 12/19/15  Yes Historical Provider, MD  phenytoin (DILANTIN) 100 MG ER capsule Take 400 mg by mouth at bedtime.    Yes Historical Provider, MD  traZODone (DESYREL) 50 MG tablet Take 50-150 mg by mouth at bedtime. Patient can take up to 3 tablets at bedtime 01/02/16  Yes Historical Provider, MD  acetaminophen (TYLENOL) 500 MG tablet Take 1,000 mg by mouth 2 (  two) times daily as needed for moderate pain.    Historical Provider, MD  albuterol (PROVENTIL HFA;VENTOLIN HFA) 108 (90 BASE) MCG/ACT inhaler Inhale 2 puffs into the lungs every 6 (six) hours as needed. For shortness of breath    Historical Provider, MD  albuterol (PROVENTIL HFA;VENTOLIN HFA) 108 (90 BASE) MCG/ACT inhaler Inhale 2 puffs into the lungs every 4 (four) hours as needed for wheezing or shortness of breath. Patient not taking: Reported on 01/17/2016 02/19/15   [name], MD  albuterol (PROVENTIL) (2.5 MG/3ML) 0.083% nebulizer solution Take 2.5 mg by nebulization every 6 (six) hours as needed. For shortness of breath    Historical Provider, MD  azithromycin (ZITHROMAX Z-PAK) 250 MG tablet Take 1 tablet (250 mg total) by mouth daily. 500mg  PO day 1, then  250mg  PO days 205 Patient not taking: Reported on 01/17/2016 02/19/15   [name], MD  benzonatate (TESSALON) 100 MG capsule Take 1 capsule (100 mg total) by mouth every 8 (eight) hours. Patient not taking: Reported on 01/17/2016 02/19/15   [name], MD  ondansetron (ZOFRAN) 4 MG tablet Take 1 tablet (4 mg total) by mouth every 8 (eight) hours as needed for nausea or vomiting. Patient not taking: Reported on 02/19/2015 11/29/13   Evalee Jefferson, PA-C  predniSONE (DELTASONE) 20 MG tablet Take 2 tablets (40 mg total) by mouth daily. Patient not taking: Reported on 01/17/2016 02/19/15   [name], MD   Triage Vitals: BP 120/71 mmHg  Pulse 81  Temp(Src) 99 F (37.2 C) (Oral)  Resp 16  Ht 5\' 3"  (1.6 m)  Wt 129 lb (58.514 kg)  BMI 22.86 kg/m2  SpO2 97% Physical Exam  Constitutional: She is oriented to person, place, and time. She appears well-developed and well-nourished. No distress.  HENT:  Head: Normocephalic and atraumatic.  Eyes: Conjunctivae and EOM are normal.  Cardiovascular: Normal rate and regular rhythm.   Pulmonary/Chest: Effort normal and breath sounds normal. No stridor. No respiratory distress.  Decreased breath sounds.  Abdominal: She exhibits no distension.  Musculoskeletal: She exhibits no edema.  Neurological: She is alert and oriented to person, place, and time. No cranial nerve deficit.  Skin: Skin is warm and dry.  Psychiatric: She has a normal mood and affect.  Nursing note and vitals reviewed.   ED Course  Procedures (including critical care time) DIAGNOSTIC STUDIES: Oxygen Saturation is 97% on [place],w [place], abnormal by my interpretation.   COORDINATION OF CARE: 9:56 AM- Will order labs and review imaging. Pt verbalizes understanding and agrees to plan.  Medications  albuterol (PROVENTIL,VENTOLIN) solution continuous neb (10 mg/hr Nebulization Given 01/17/16 1100)    Labs Review Labs Reviewed  CBC - Abnormal; Notable for the following:    RBC 3.74 (*)     Hemoglobin 11.7 (*)    HCT 34.4 (*)    All other components within normal limits  COMPREHENSIVE METABOLIC PANEL - Abnormal; Notable for the following:    Glucose, Bld 113 (*)    AST 14 (*)    ALT 12 (*)    Total Bilirubin 0.2 (*)    All other components within normal limits  TROPONIN I    Imaging Review Dg Chest 2 View  01/17/2016  CLINICAL DATA:  Shortness of breath and chest pain EXAM: CHEST  2 VIEW COMPARISON:  January 15, 2016 FINDINGS: Lungs are hyperexpanded. There is slight scarring in the left base. There is no edema or consolidation. Heart size and pulmonary vascularity are normal. No adenopathy. There  is degenerative change in the thoracic spine. No pneumothorax. IMPRESSION: Lungs remain hyperexpanded. No edema or consolidation. No appreciable change compared to recent prior study. Electronically Signed   By: Lowella Grip III M.D.   On: 01/17/2016 11:36   Dg Chest 2 View  01/15/2016  CLINICAL DATA:  Chest pain for 3 days. Intermittent cough and shortness of Breath EXAM: CHEST  2 VIEW COMPARISON:  February 04, 2015 FINDINGS: Lungs remain somewhat hyperexpanded. There is slight scarring in the left base. There is no edema or consolidation. Heart size and pulmonary vascularity are normal. No adenopathy. There is degenerative change in the thoracic spine. IMPRESSION: Lungs somewhat hyperexpanded. No edema or consolidation. Slight scarring left base. Electronically Signed   By: Lowella Grip III M.D.   On: 01/15/2016 14:37   I have personally reviewed and evaluated these images and lab results as part of my medical decision-making.   EKG Interpretation   Date/Time:  Tuesday January 17 2016 09:20:49 EDT Ventricular Rate:  79 PR Interval:  168 QRS Duration: 80 QT Interval:  375 QTC Calculation: 430 R Axis:   84 Text Interpretation:  Sinus rhythm Borderline right axis deviation  Nonspecific T abnrm, anterolateral leads Minimal ST elevation, inferior  leads Sinus rhythm T wave  abnormality No significant change since last  tracing Abnormal ekg Confirmed by Carmin Muskrat  MD [phone]) on 01/17/2016  9:31:11 AM     12:37 PM Patient in no distress. We discussed all findings, likely COPD exacerbation. Patient remains in similar condition from my initial evaluation, no evidence for distress  Smoking cessation provided, particularly in light of this patient's evaluation in the ED.  MDM   I personally performed the services described in this documentation, which was scribed in my presence. The recorded information has been reviewed and is accurate.    This patient with long smoking history, COPD, home oxygen dependency presents with concern for ongoing generalized illness, cough, dyspnea. Here, on her typical oxygen she has no hypoxia. She is awake, alert, in no distress, no fever. No evidence for pneumonia. Evaluation generally reassuring, with no evidence for acute new pathology.   Carmin Muskrat, MD 01/17/16 1240

## 2016-01-17 NOTE — ED Notes (Signed)
Per EMS: Pt reports "feeling bad" for a few days, went to dr for checkup yesterday, pt came to ER and found no significant results. Pt has sob and cp, coughing up white sputum.  Hx copd, lungs clear.

## 2016-01-17 NOTE — Discharge Instructions (Signed)
Please be sure to follow-up with your physician. Please use all medication as directed, and use albuterol treatment every 4 hours for the next 2 days.  Return here for concerning changes in your condition.   COPD ACTION PLAN Actions to Take if My Symptoms Get Worse 24-Hour Nurse Call Line: 1 [phone]  Green Zone: I am doing well today  Symptoms: Actions:  Usual activity and exercise level Usual amounts of cough and phlegm/mucus Sleep well at night Appetite is good Continue to take daily maintenance medicines (the ones you take no matter what until your doctor adjusts them for you) Use oxygen as prescribed Continue regular exercise/diet plan At all times avoid cigarette smoke, inhaled irritants  Yellow Zone: I am having a bad day or a COPD flare  Symptoms Actions  More breathless than usual I have less energy for my daily activities Increased or thicker phlegm/mucus Change in color of phlegm/mucus Using quick relief inhaler/nebulizer more often More coughing than usual I feel like I have a chest cold Poor sleep and my symptoms woke me up My appetite is not good My medicine is not helping Continue daily medications Use quick relief inhaler or nebulizer every 4 hours Use oxygen as prescribed Get plenty of rest Use pursed lip breathing At all times avoid cigarette smoke, inhaled irritants Call provider for a same day or next day appointment  if symptoms are not improving within 48 hours of onset or  if symptoms are not improving after quick relief inhaler or nebulizer  Red Zone: I need urgent medical care  Symptoms Actions  Severe shortness of breath even at rest Severe shortness of breath even after quick relief inhaler or nebulizer Not able to do any activity because of breathing Fever or shaking chills Feeling confused or very drowsy Chest pains Coughing up blood Quick relief inhaler or nebulizer not effective Call 911 or have someone take you to the  nearest emergency room Call provider for  now or same day appointment  3 - 2 - 1 Plan: If any of the 3 main COPD symptoms (Cough, Shortness of Breath, or Mucus Production) change for 2 days or more, your number 1 priority is to call your doctor.

## 2016-01-19 ENCOUNTER — Emergency Department (HOSPITAL_COMMUNITY): Payer: Medicare Other

## 2016-01-19 ENCOUNTER — Emergency Department (HOSPITAL_COMMUNITY)
Admission: EM | Admit: 2016-01-19 | Discharge: 2016-01-19 | Disposition: A | Discharge status: Home / Self Care | Payer: Medicare Other | Attending: Dermatology | Admitting: Dermatology

## 2016-01-19 ENCOUNTER — Encounter (HOSPITAL_COMMUNITY): Payer: Self-pay | Admitting: Emergency Medicine

## 2016-01-19 DIAGNOSIS — R072 Precordial pain: Secondary | ICD-10-CM | POA: Insufficient documentation

## 2016-01-19 DIAGNOSIS — F17210 Nicotine dependence, cigarettes, uncomplicated: Secondary | ICD-10-CM | POA: Diagnosis not present

## 2016-01-19 DIAGNOSIS — F319 Bipolar disorder, unspecified: Secondary | ICD-10-CM | POA: Insufficient documentation

## 2016-01-19 DIAGNOSIS — Z5321 Procedure and treatment not carried out due to patient leaving prior to being seen by health care provider: Secondary | ICD-10-CM | POA: Insufficient documentation

## 2016-01-19 DIAGNOSIS — J449 Chronic obstructive pulmonary disease, unspecified: Secondary | ICD-10-CM | POA: Diagnosis not present

## 2016-01-19 LAB — CBC
HEMATOCRIT: 34.4 % — AB (ref 36.0–46.0)
Hemoglobin: 12 g/dL (ref 12.0–15.0)
MCH: 32 pg (ref 26.0–34.0)
MCHC: 34.9 g/dL (ref 30.0–36.0)
MCV: 91.7 fL (ref 78.0–100.0)
PLATELETS: 261 10*3/uL (ref 150–400)
RBC: 3.75 MIL/uL — ABNORMAL LOW (ref 3.87–5.11)
RDW: 12.8 % (ref 11.5–15.5)
WBC: 7.1 10*3/uL (ref 4.0–10.5)

## 2016-01-19 LAB — BASIC METABOLIC PANEL
Anion gap: 9 (ref 5–15)
BUN: 12 mg/dL (ref 6–20)
CHLORIDE: 97 mmol/L — AB (ref 101–111)
CO2: 23 mmol/L (ref 22–32)
CREATININE: 1.15 mg/dL — AB (ref 0.44–1.00)
Calcium: 8.1 mg/dL — ABNORMAL LOW (ref 8.9–10.3)
GFR calc Af Amer: 57 mL/min — ABNORMAL LOW (ref >60)
GFR calc non Af Amer: 49 mL/min — ABNORMAL LOW (ref >60)
GLUCOSE: 106 mg/dL — AB (ref 65–99)
POTASSIUM: 3.9 mmol/L (ref 3.5–5.1)
SODIUM: 129 mmol/L — AB (ref 135–145)

## 2016-01-19 LAB — TROPONIN I: Troponin I: <0.03 ng/mL (ref <0.031)

## 2016-01-19 NOTE — ED Notes (Signed)
Pt reports sob and cp in mid sternal area with no radiation.  Pt has nausea, no vomiting, and weakness.

## 2016-08-09 ENCOUNTER — Emergency Department (HOSPITAL_COMMUNITY): Payer: Medicare HMO

## 2016-08-09 ENCOUNTER — Encounter (HOSPITAL_COMMUNITY): Payer: Self-pay | Admitting: Emergency Medicine

## 2016-08-09 ENCOUNTER — Emergency Department (HOSPITAL_COMMUNITY)
Admission: EM | Admit: 2016-08-09 | Discharge: 2016-08-09 | Disposition: A | Discharge status: Home / Self Care | Payer: Medicare HMO | Attending: Emergency Medicine | Admitting: Emergency Medicine

## 2016-08-09 DIAGNOSIS — Z79899 Other long term (current) drug therapy: Secondary | ICD-10-CM | POA: Diagnosis not present

## 2016-08-09 DIAGNOSIS — J449 Chronic obstructive pulmonary disease, unspecified: Secondary | ICD-10-CM | POA: Diagnosis not present

## 2016-08-09 DIAGNOSIS — R109 Unspecified abdominal pain: Secondary | ICD-10-CM

## 2016-08-09 DIAGNOSIS — K529 Noninfective gastroenteritis and colitis, unspecified: Secondary | ICD-10-CM

## 2016-08-09 DIAGNOSIS — F17210 Nicotine dependence, cigarettes, uncomplicated: Secondary | ICD-10-CM | POA: Diagnosis not present

## 2016-08-09 LAB — URINALYSIS, ROUTINE W REFLEX MICROSCOPIC
BILIRUBIN URINE: NEGATIVE
Glucose, UA: NEGATIVE mg/dL
Leukocytes, UA: NEGATIVE
NITRITE: NEGATIVE
Specific Gravity, Urine: 1.01 (ref 1.005–1.030)
pH: 6.5 (ref 5.0–8.0)

## 2016-08-09 LAB — CBC
HCT: 42.7 % (ref 36.0–46.0)
HEMOGLOBIN: 14.7 g/dL (ref 12.0–15.0)
MCH: 32 pg (ref 26.0–34.0)
MCHC: 34.4 g/dL (ref 30.0–36.0)
MCV: 93 fL (ref 78.0–100.0)
PLATELETS: 297 10*3/uL (ref 150–400)
RBC: 4.59 MIL/uL (ref 3.87–5.11)
RDW: 13.6 % (ref 11.5–15.5)
WBC: 8.2 10*3/uL (ref 4.0–10.5)

## 2016-08-09 LAB — COMPREHENSIVE METABOLIC PANEL
ALK PHOS: 130 U/L — AB (ref 38–126)
ALT: 19 U/L (ref 14–54)
ANION GAP: 9 (ref 5–15)
AST: 17 U/L (ref 15–41)
Albumin: 4.3 g/dL (ref 3.5–5.0)
BILIRUBIN TOTAL: 0.6 mg/dL (ref 0.3–1.2)
BUN: 9 mg/dL (ref 6–20)
CALCIUM: 9.3 mg/dL (ref 8.9–10.3)
CO2: 25 mmol/L (ref 22–32)
CREATININE: 0.82 mg/dL (ref 0.44–1.00)
Chloride: 103 mmol/L (ref 101–111)
Glucose, Bld: 153 mg/dL — ABNORMAL HIGH (ref 65–99)
Potassium: 4.1 mmol/L (ref 3.5–5.1)
Sodium: 137 mmol/L (ref 135–145)
TOTAL PROTEIN: 7.9 g/dL (ref 6.5–8.1)

## 2016-08-09 LAB — URINE MICROSCOPIC-ADD ON

## 2016-08-09 LAB — LIPASE, BLOOD: Lipase: 26 U/L (ref 11–51)

## 2016-08-09 MED ORDER — ONDANSETRON 4 MG PO TBDP: 4.0000 mg | ORAL_TABLET | Freq: Three times a day (TID) | ORAL | 0 refills | Status: DC | PRN

## 2016-08-09 MED ORDER — ONDANSETRON 4 MG PO TBDP
4.0000 mg | ORAL_TABLET | Freq: Once | ORAL | Status: AC | PRN
  Administered 2016-08-09: 4 mg via ORAL
  Filled 2016-08-09: qty 1

## 2016-08-09 MED ORDER — ONDANSETRON HCL 4 MG/2ML IJ SOLN
4.0000 mg | Freq: Once | INTRAMUSCULAR | Status: AC
  Administered 2016-08-09: 4 mg via INTRAVENOUS
  Filled 2016-08-09: qty 2

## 2016-08-09 MED ORDER — IOPAMIDOL (ISOVUE-300) INJECTION 61%
INTRAVENOUS | Status: AC
  Filled 2016-08-09: qty 30

## 2016-08-09 MED ORDER — MORPHINE SULFATE (PF) 4 MG/ML IV SOLN
4.0000 mg | Freq: Once | INTRAVENOUS | Status: AC
  Administered 2016-08-09: 4 mg via INTRAVENOUS
  Filled 2016-08-09: qty 1

## 2016-08-09 MED ORDER — IOPAMIDOL (ISOVUE-300) INJECTION 61%
100.0000 mL | Freq: Once | INTRAVENOUS | Status: AC | PRN
  Administered 2016-08-09: 100 mL via INTRAVENOUS

## 2016-08-09 MED ORDER — SODIUM CHLORIDE 0.9 % IV BOLUS (SEPSIS)
1000.0000 mL | Freq: Once | INTRAVENOUS | Status: AC
  Administered 2016-08-09: 1000 mL via INTRAVENOUS

## 2016-08-09 NOTE — ED Notes (Signed)
Pt unable to drink contrast due to nausea, CT notified.

## 2016-08-09 NOTE — ED Provider Notes (Signed)
[facility] DEPT Provider Note   CSN: [id number] Arrival date & time: 08/09/16  1418     History   Chief Complaint Chief Complaint  Patient presents with  . Abdominal Pain    HPI [name] is a 65 y.o. female.  HPI  65 year old female presents with vomiting, diarrhea, and abdominal pain. She states it all started this morning. Started with abdominal pain first and then has had multiple episodes of nonbloody vomit. Has had about 5 or 6 loose watery stools without blood either. Denies any urinary symptoms. Has not had fevers. The abdominal pain is umbilical. Denies any chest pain or shortness of breath. She states she has ulcers and this feels similar.  Past Medical History:  Diagnosis Date  . Alcohol abuse, in remission   . Anxiety   . Bipolar disorder ([place])   . COPD (chronic obstructive pulmonary disease) ([place])   . Gastric ulcer   . Hemorrhoids   . IBS (irritable bowel syndrome)   . Seizures [facility])     Patient Active Problem List   Diagnosis Date Noted  . DYSPEPSIA 06/13/2010  . IRRITABLE BOWEL SYNDROME 06/13/2010  . HEMATOCHEZIA 06/13/2010  . WEIGHT LOSS, ABNORMAL 06/13/2010  . ABDOMINAL PAIN, GENERALIZED 06/13/2010    Past Surgical History:  Procedure Laterality Date  . ABDOMINAL HYSTERECTOMY    . CHOLECYSTECTOMY    . EYE SURGERY      OB History    Gravida Para Term Preterm AB Living   4 4 4     3    SAB TAB Ectopic Multiple Live Births                   Home Medications    Prior to Admission medications   Medication Sig Start Date End Date Taking? Authorizing Provider  albuterol (PROVENTIL HFA;VENTOLIN HFA) 108 (90 BASE) MCG/ACT inhaler Inhale 2 puffs into the lungs every 6 (six) hours as needed for wheezing or shortness of breath.    Yes Historical Provider, MD  albuterol (PROVENTIL) (2.5 MG/3ML) 0.083% nebulizer solution Take 2.5 mg by nebulization every 6 (six) hours as needed for wheezing or shortness of breath.    Yes Historical Provider,  MD  clonazePAM (KLONOPIN) 1 MG tablet Take 1 mg by mouth 3 (three) times daily.    Yes Historical Provider, MD  dicyclomine (BENTYL) 10 MG capsule Take 10 mg by mouth 2 (two) times daily.   Yes Historical Provider, MD  omeprazole (PRILOSEC) 40 MG capsule Take 40 mg by mouth daily.   Yes Historical Provider, MD  phenytoin (DILANTIN) 100 MG ER capsule Take 400 mg by mouth at bedtime.    Yes Historical Provider, MD  traZODone (DESYREL) 50 MG tablet Take 50-150 mg by mouth at bedtime as needed for sleep.    Yes Historical Provider, MD  ondansetron (ZOFRAN ODT) 4 MG disintegrating tablet Take 1 tablet (4 mg total) by mouth every 8 (eight) hours as needed for nausea or vomiting. 08/09/16   Sherwood Gambler, MD    Family History History reviewed. No pertinent family history.  Social History Social History  Substance Use Topics  . Smoking status: Current Every Day Smoker    Packs/day: 0.25    Years: 32.00    Types: Cigarettes  . Smokeless tobacco: Never Used  . Alcohol use No     Allergies   Flu virus vaccine; Lithium; and Varenicline tartrate   Review of Systems Review of Systems  Constitutional: Negative for fever.  Respiratory: Negative  for shortness of breath.   Cardiovascular: Negative for chest pain.  Gastrointestinal: Positive for abdominal pain, diarrhea, nausea and vomiting. Negative for blood in stool.  Genitourinary: Negative for dysuria.  All other systems reviewed and are negative.    Physical Exam Updated Vital Signs BP 163/79   Pulse 76   Temp 98.3 F (36.8 C) (Oral)   Resp 16   Ht 5' 2.5" (1.588 m)   Wt 119 lb (54 kg)   SpO2 97%   BMI 21.42 kg/m   Physical Exam  Constitutional: She is oriented to person, place, and time. She appears well-developed and well-nourished.  Actively vomiting, yellow  HENT:  Head: Normocephalic and atraumatic.  Right Ear: External ear normal.  Left Ear: External ear normal.  Nose: Nose normal.  Eyes: Right eye exhibits no  discharge. Left eye exhibits no discharge.  Cardiovascular: Normal rate, regular rhythm and normal heart sounds.   Pulmonary/Chest: Effort normal and breath sounds normal.  Abdominal: Soft. There is tenderness in the right upper quadrant and right lower quadrant.  Neurological: She is alert and oriented to person, place, and time.  Skin: Skin is warm and dry.  Nursing note and vitals reviewed.    ED Treatments / Results  Labs (all labs ordered are listed, but only abnormal results are displayed) Labs Reviewed  COMPREHENSIVE METABOLIC PANEL - Abnormal; Notable for the following:       Result Value   Glucose, Bld 153 (*)    Alkaline Phosphatase 130 (*)    All other components within normal limits  URINALYSIS, ROUTINE W REFLEX MICROSCOPIC (NOT AT [facility]) - Abnormal; Notable for the following:    Hgb urine dipstick SMALL (*)    Ketones, ur TRACE (*)    Protein, ur TRACE (*)    All other components within normal limits  URINE MICROSCOPIC-ADD ON - Abnormal; Notable for the following:    Squamous Epithelial / LPF 6-30 (*)    Bacteria, UA RARE (*)    All other components within normal limits  LIPASE, BLOOD  CBC    EKG  EKG Interpretation None       Radiology Ct Abdomen Pelvis W Contrast  Result Date: 08/09/2016 CLINICAL DATA:  Right lower quadrant pain with nausea, vomiting and diarrhea beginning today. EXAM: CT ABDOMEN AND PELVIS WITH CONTRAST TECHNIQUE: Multidetector CT imaging of the abdomen and pelvis was performed using the standard protocol following bolus administration of intravenous contrast. CONTRAST:  178mL ISOVUE-300 IOPAMIDOL (ISOVUE-300) INJECTION 61% COMPARISON:  04/20/2010 FINDINGS: Lower chest: Linear scarring and/or atelectasis at the lung bases. No pleural or pericardial fluid. Coronary artery calcification. Hepatobiliary: Previous cholecystectomy. Intra and extra hepatic ductal dilatation typical of that. No calcified ductal stone. Pancreas: Normal Spleen: Normal  Adrenals/Urinary Tract: Left adrenal mass has enlarged, now measuring 3.2 x 2.2 cm. Contrast seems to washout fairly aggressively on the delayed images. This probably represents a benign adenoma, but the enlargement is concerning. Formal dynamic CT study with washout calculations recommended. Right adrenal gland is normal. Both kidneys are normal except for a 7 mm cyst in the left kidney. Stomach/Bowel: The appendix is normal. No other bowel pathology is seen. Vascular/Lymphatic: Aortic atherosclerosis. No aneurysm. The IVC is normal. No retroperitoneal mass or lymphadenopathy. Reproductive: Previous hysterectomy.  No pelvic mass PE Other: No free fluid or air. Musculoskeletal: Chronic lumbar spine degenerative changes. IMPRESSION: No abnormality seen to explain acute symptoms. Previous hysterectomy. Chronic intra and extra hepatic biliary ductal dilatation. **An incidental finding of potential  clinical significance has been found. 3.2 x 2.2 x 3.1cm left adrenal mass, enlarged from 1.5 cm in 2011. I think this probably represents an adrenal adenoma, but the enlargement is concerning. Formal CT study with washout calculation is suggested.** Aortic atherosclerosis. Normal appearing appendix. Electronically Signed   By: [place] Chimes M.D.   On: 08/09/2016 19:59    Procedures Procedures (including critical care time)  Medications Ordered in ED Medications  ondansetron (ZOFRAN-ODT) disintegrating tablet 4 mg (4 mg Oral Given 08/09/16 1500)  ondansetron (ZOFRAN) injection 4 mg (4 mg Intravenous Given 08/09/16 1715)  sodium chloride 0.9 % bolus 1,000 mL (0 mLs Intravenous Stopped 08/09/16 1855)  morphine 4 MG/ML injection 4 mg (4 mg Intravenous Given 08/09/16 1715)  iopamidol (ISOVUE-300) 61 % injection 100 mL (100 mLs Intravenous Contrast Given 08/09/16 1910)     Initial Impression / Assessment and Plan / ED Course  I have reviewed the triage vital signs and the nursing notes.  Pertinent labs & imaging  results that were available during my care of the patient were reviewed by me and considered in my medical decision making (see chart for details).  Clinical Course  Comment By Time  Given the multiple episodes of nonbloody vomiting and diarrhea this is probably a viral illness. However she is more tender than I would expect and thus, will get a CT scan given that it seems more right-sided than diffuse. She is actively vomiting now despite the ODT Zofran, will give IV Zofran and IV morphine. Sherwood Gambler, MD 10/12 [phone]  Feels better, no more pain or nausea. Waiting on [place], MD 10/12 1828  Feeling well, no vomiting. Will d/c home with zofran. Sherwood Gambler, MD 10/12 2030    CT scan is unremarkable. Urine not indicative of UTI. Labs benign. Once she has gotten IV Zofran her vomiting has stopped. Her pain is now controlled. This is most likely a viral gastroenteritis. Discussed return precautions, discharge with Zofran and recommend follow-up with PCP.  Final Clinical Impressions(s) / ED Diagnoses   Final diagnoses:  Gastroenteritis  Abdominal pain, unspecified abdominal location    New Prescriptions Discharge Medication List as of 08/09/2016  8:30 PM    START taking these medications   Details  ondansetron (ZOFRAN ODT) 4 MG disintegrating tablet Take 1 tablet (4 mg total) by mouth every 8 (eight) hours as needed for nausea or vomiting., Starting Thu 08/09/2016, Print         Sherwood Gambler, MD 08/10/16 [phone]

## 2016-08-09 NOTE — ED Triage Notes (Signed)
Pt reports n/v/d that started this am.

## 2016-08-09 NOTE — ED Notes (Signed)
Received report on pt, pt in ct at present time, family at bedside updated,

## 2016-08-21 ENCOUNTER — Emergency Department (HOSPITAL_COMMUNITY): Payer: Medicare HMO

## 2016-08-21 ENCOUNTER — Observation Stay (HOSPITAL_COMMUNITY)
Admission: EM | Admit: 2016-08-21 | Discharge: 2016-08-22 | Disposition: A | Discharge status: Home / Self Care | Payer: Medicare HMO | Attending: Internal Medicine | Admitting: Internal Medicine

## 2016-08-21 ENCOUNTER — Encounter (HOSPITAL_COMMUNITY): Payer: Self-pay

## 2016-08-21 DIAGNOSIS — F17210 Nicotine dependence, cigarettes, uncomplicated: Secondary | ICD-10-CM | POA: Insufficient documentation

## 2016-08-21 DIAGNOSIS — Z79899 Other long term (current) drug therapy: Secondary | ICD-10-CM | POA: Insufficient documentation

## 2016-08-21 DIAGNOSIS — E278 Other specified disorders of adrenal gland: Secondary | ICD-10-CM | POA: Diagnosis present

## 2016-08-21 DIAGNOSIS — K529 Noninfective gastroenteritis and colitis, unspecified: Secondary | ICD-10-CM | POA: Diagnosis not present

## 2016-08-21 DIAGNOSIS — R112 Nausea with vomiting, unspecified: Secondary | ICD-10-CM

## 2016-08-21 DIAGNOSIS — R911 Solitary pulmonary nodule: Secondary | ICD-10-CM | POA: Diagnosis present

## 2016-08-21 DIAGNOSIS — J449 Chronic obstructive pulmonary disease, unspecified: Secondary | ICD-10-CM | POA: Diagnosis not present

## 2016-08-21 DIAGNOSIS — F17200 Nicotine dependence, unspecified, uncomplicated: Secondary | ICD-10-CM | POA: Diagnosis present

## 2016-08-21 LAB — COMPREHENSIVE METABOLIC PANEL
ALBUMIN: 3.9 g/dL (ref 3.5–5.0)
ALK PHOS: 112 U/L (ref 38–126)
ALT: 17 U/L (ref 14–54)
ANION GAP: 6 (ref 5–15)
AST: 13 U/L — AB (ref 15–41)
BUN: 8 mg/dL (ref 6–20)
CALCIUM: 8.5 mg/dL — AB (ref 8.9–10.3)
CO2: 25 mmol/L (ref 22–32)
CREATININE: 0.79 mg/dL (ref 0.44–1.00)
Chloride: 107 mmol/L (ref 101–111)
GFR calc Af Amer: >60 mL/min (ref >60)
GFR calc non Af Amer: >60 mL/min (ref >60)
GLUCOSE: 142 mg/dL — AB (ref 65–99)
Potassium: 3.7 mmol/L (ref 3.5–5.1)
SODIUM: 138 mmol/L (ref 135–145)
Total Bilirubin: 0.5 mg/dL (ref 0.3–1.2)
Total Protein: 7.1 g/dL (ref 6.5–8.1)

## 2016-08-21 LAB — CBC WITH DIFFERENTIAL/PLATELET
Basophils Absolute: 0 10*3/uL (ref 0.0–0.1)
Basophils Relative: 1 %
EOS PCT: 0 %
Eosinophils Absolute: 0 10*3/uL (ref 0.0–0.7)
HEMATOCRIT: 40.6 % (ref 36.0–46.0)
Hemoglobin: 13.6 g/dL (ref 12.0–15.0)
LYMPHS ABS: 1.1 10*3/uL (ref 0.7–4.0)
LYMPHS PCT: 14 %
MCH: 31.5 pg (ref 26.0–34.0)
MCHC: 33.5 g/dL (ref 30.0–36.0)
MCV: 94 fL (ref 78.0–100.0)
MONO ABS: 0.6 10*3/uL (ref 0.1–1.0)
MONOS PCT: 7 %
NEUTROS ABS: 6.2 10*3/uL (ref 1.7–7.7)
Neutrophils Relative %: 78 %
PLATELETS: 269 10*3/uL (ref 150–400)
RBC: 4.32 MIL/uL (ref 3.87–5.11)
RDW: 13.7 % (ref 11.5–15.5)
WBC: 7.9 10*3/uL (ref 4.0–10.5)

## 2016-08-21 LAB — LACTIC ACID, PLASMA
Lactic Acid, Venous: 0.6 mmol/L (ref 0.5–1.9)
Lactic Acid, Venous: 0.7 mmol/L (ref 0.5–1.9)

## 2016-08-21 LAB — URINALYSIS, ROUTINE W REFLEX MICROSCOPIC
Bilirubin Urine: NEGATIVE
Glucose, UA: NEGATIVE mg/dL
Ketones, ur: NEGATIVE mg/dL
LEUKOCYTES UA: NEGATIVE
NITRITE: NEGATIVE
Protein, ur: NEGATIVE mg/dL
SPECIFIC GRAVITY, URINE: 1.01 (ref 1.005–1.030)
pH: 7.5 (ref 5.0–8.0)

## 2016-08-21 LAB — URINE MICROSCOPIC-ADD ON

## 2016-08-21 LAB — LIPASE, BLOOD: Lipase: 20 U/L (ref 11–51)

## 2016-08-21 LAB — PHENYTOIN LEVEL, TOTAL: Phenytoin Lvl: 16.4 ug/mL (ref 10.0–20.0)

## 2016-08-21 MED ORDER — ALBUTEROL SULFATE (2.5 MG/3ML) 0.083% IN NEBU: 2.5000 mg | INHALATION_SOLUTION | Freq: Four times a day (QID) | RESPIRATORY_TRACT | Status: DC | PRN

## 2016-08-21 MED ORDER — CIPROFLOXACIN IN D5W 400 MG/200ML IV SOLN
400.0000 mg | Freq: Two times a day (BID) | INTRAVENOUS | Status: DC
  Administered 2016-08-22: 400 mg via INTRAVENOUS
  Filled 2016-08-21 (×2): qty 200

## 2016-08-21 MED ORDER — LACTATED RINGERS IV SOLN
INTRAVENOUS | Status: DC
  Administered 2016-08-21 – 2016-08-22 (×2): via INTRAVENOUS

## 2016-08-21 MED ORDER — ACETAMINOPHEN 650 MG RE SUPP: 650.0000 mg | Freq: Four times a day (QID) | RECTAL | Status: DC | PRN

## 2016-08-21 MED ORDER — CLONAZEPAM 0.5 MG PO TABS
1.0000 mg | ORAL_TABLET | Freq: Three times a day (TID) | ORAL | Status: DC
  Administered 2016-08-21 – 2016-08-22 (×2): 1 mg via ORAL
  Filled 2016-08-21 (×3): qty 2

## 2016-08-21 MED ORDER — ONDANSETRON HCL 4 MG/2ML IJ SOLN
4.0000 mg | Freq: Four times a day (QID) | INTRAMUSCULAR | Status: DC | PRN
  Administered 2016-08-21: 4 mg via INTRAVENOUS
  Filled 2016-08-21: qty 2

## 2016-08-21 MED ORDER — ACETAMINOPHEN 325 MG PO TABS
650.0000 mg | ORAL_TABLET | Freq: Four times a day (QID) | ORAL | Status: DC | PRN
  Administered 2016-08-22: 650 mg via ORAL
  Filled 2016-08-21: qty 2

## 2016-08-21 MED ORDER — ONDANSETRON HCL 4 MG/2ML IJ SOLN: 4.0000 mg | INTRAMUSCULAR | Status: DC | PRN

## 2016-08-21 MED ORDER — METRONIDAZOLE IN NACL 5-0.79 MG/ML-% IV SOLN
500.0000 mg | Freq: Once | INTRAVENOUS | Status: AC
  Administered 2016-08-21: 500 mg via INTRAVENOUS
  Filled 2016-08-21: qty 100

## 2016-08-21 MED ORDER — DICYCLOMINE HCL 10 MG/ML IM SOLN
20.0000 mg | Freq: Once | INTRAMUSCULAR | Status: AC
  Administered 2016-08-21: 20 mg via INTRAMUSCULAR
  Filled 2016-08-21: qty 2

## 2016-08-21 MED ORDER — ONDANSETRON HCL 4 MG/2ML IJ SOLN: 4.0000 mg | Freq: Three times a day (TID) | INTRAMUSCULAR | Status: DC | PRN

## 2016-08-21 MED ORDER — PHENYTOIN SODIUM EXTENDED 100 MG PO CAPS
400.0000 mg | ORAL_CAPSULE | Freq: Every day | ORAL | Status: DC
  Administered 2016-08-21: 400 mg via ORAL
  Filled 2016-08-21: qty 4

## 2016-08-21 MED ORDER — ONDANSETRON HCL 4 MG PO TABS
4.0000 mg | ORAL_TABLET | Freq: Four times a day (QID) | ORAL | Status: DC | PRN
  Administered 2016-08-22: 4 mg via ORAL
  Filled 2016-08-21: qty 1

## 2016-08-21 MED ORDER — CIPROFLOXACIN IN D5W 400 MG/200ML IV SOLN
400.0000 mg | Freq: Once | INTRAVENOUS | Status: AC
  Administered 2016-08-21: 400 mg via INTRAVENOUS
  Filled 2016-08-21: qty 200

## 2016-08-21 MED ORDER — FAMOTIDINE IN NACL 20-0.9 MG/50ML-% IV SOLN
20.0000 mg | Freq: Once | INTRAVENOUS | Status: AC
  Administered 2016-08-21: 20 mg via INTRAVENOUS
  Filled 2016-08-21: qty 50

## 2016-08-21 MED ORDER — TRAZODONE HCL 50 MG PO TABS: 50.0000 mg | ORAL_TABLET | Freq: Every evening | ORAL | Status: DC | PRN

## 2016-08-21 MED ORDER — NICOTINE 7 MG/24HR TD PT24
7.0000 mg | MEDICATED_PATCH | Freq: Every day | TRANSDERMAL | Status: DC
  Administered 2016-08-22: 7 mg via TRANSDERMAL
  Filled 2016-08-21: qty 1

## 2016-08-21 MED ORDER — SODIUM CHLORIDE 0.9 % IV SOLN
INTRAVENOUS | Status: AC
  Administered 2016-08-21: 18:00:00 via INTRAVENOUS

## 2016-08-21 MED ORDER — ENOXAPARIN SODIUM 40 MG/0.4ML ~~LOC~~ SOLN
40.0000 mg | SUBCUTANEOUS | Status: DC
  Administered 2016-08-21: 40 mg via SUBCUTANEOUS
  Filled 2016-08-21: qty 0.4

## 2016-08-21 MED ORDER — METRONIDAZOLE IN NACL 5-0.79 MG/ML-% IV SOLN
500.0000 mg | Freq: Three times a day (TID) | INTRAVENOUS | Status: DC
  Administered 2016-08-22 (×2): 500 mg via INTRAVENOUS
  Filled 2016-08-21 (×3): qty 100

## 2016-08-21 NOTE — ED Provider Notes (Signed)
[facility] DEPT Provider Note   CSN: [id number] Arrival date & time: 08/21/16  1216     History   Chief Complaint Chief Complaint  Patient presents with  . Abdominal Pain  . Emesis    HPI [name] is a 65 y.o. female.  HPI  Pt was seen at 1240. Per pt, c/o gradual onset and persistence of multiple intermittent episodes of N/V that began yesterday.   Has been associated with generalized abd "pain." Denies diarrhea, no CP/SOB, no back pain, no fevers, no black or blood in stools or emesis. Pt was evaluated last week for this same complaint with reassuring workup.     Past Medical History:  Diagnosis Date  . Alcohol abuse, in remission   . Anxiety   . Bipolar disorder ([place])   . COPD (chronic obstructive pulmonary disease) ([place])   . Gastric ulcer   . Hemorrhoids   . IBS (irritable bowel syndrome)   . Seizures [facility])     Patient Active Problem List   Diagnosis Date Noted  . DYSPEPSIA 06/13/2010  . IRRITABLE BOWEL SYNDROME 06/13/2010  . HEMATOCHEZIA 06/13/2010  . WEIGHT LOSS, ABNORMAL 06/13/2010  . ABDOMINAL PAIN, GENERALIZED 06/13/2010    Past Surgical History:  Procedure Laterality Date  . ABDOMINAL HYSTERECTOMY    . CHOLECYSTECTOMY    . EYE SURGERY      OB History    Gravida Para Term Preterm AB Living   4 4 4     3    SAB TAB Ectopic Multiple Live Births                   Home Medications    Prior to Admission medications   Medication Sig Start Date End Date Taking? Authorizing Provider  albuterol (PROVENTIL HFA;VENTOLIN HFA) 108 (90 BASE) MCG/ACT inhaler Inhale 2 puffs into the lungs every 6 (six) hours as needed for wheezing or shortness of breath.     Historical Provider, MD  albuterol (PROVENTIL) (2.5 MG/3ML) 0.083% nebulizer solution Take 2.5 mg by nebulization every 6 (six) hours as needed for wheezing or shortness of breath.     Historical Provider, MD  clonazePAM (KLONOPIN) 1 MG tablet Take 1 mg by mouth 3 (three) times daily.      Historical Provider, MD  dicyclomine (BENTYL) 10 MG capsule Take 10 mg by mouth 2 (two) times daily.    Historical Provider, MD  omeprazole (PRILOSEC) 40 MG capsule Take 40 mg by mouth daily.    Historical Provider, MD  ondansetron (ZOFRAN ODT) 4 MG disintegrating tablet Take 1 tablet (4 mg total) by mouth every 8 (eight) hours as needed for nausea or vomiting. 08/09/16   Sherwood Gambler, MD  phenytoin (DILANTIN) 100 MG ER capsule Take 400 mg by mouth at bedtime.     Historical Provider, MD  traZODone (DESYREL) 50 MG tablet Take 50-150 mg by mouth at bedtime as needed for sleep.     Historical Provider, MD    Family History No family history on file.  Social History Social History  Substance Use Topics  . Smoking status: Current Every Day Smoker    Packs/day: 0.25    Years: 32.00    Types: Cigarettes  . Smokeless tobacco: Never Used  . Alcohol use No     Allergies   Flu virus vaccine; Lithium; and Varenicline tartrate   Review of Systems Review of Systems ROS: Statement: All systems negative except as marked or noted in the HPI; Constitutional: Negative for  fever and chills. ; ; Eyes: Negative for eye pain, redness and discharge. ; ; ENMT: Negative for ear pain, hoarseness, nasal congestion, sinus pressure and sore throat. ; ; Cardiovascular: Negative for chest pain, palpitations, diaphoresis, dyspnea and peripheral edema. ; ; Respiratory: Negative for cough, wheezing and stridor. ; ; Gastrointestinal: +N/V, abd pain. Negative for diarrhea, blood in stool, hematemesis, jaundice and rectal bleeding. . ; ; Genitourinary: Negative for dysuria, flank pain and hematuria. ; ; Musculoskeletal: Negative for back pain and neck pain. Negative for swelling and trauma.; ; Skin: Negative for pruritus, rash, abrasions, blisters, bruising and skin lesion.; ; Neuro: Negative for headache, lightheadedness and neck stiffness. Negative for weakness, altered level of consciousness, altered mental status,  extremity weakness, paresthesias, involuntary movement, seizure and syncope.      Physical Exam Updated Vital Signs BP 165/76 (BP Location: Right Arm)   Pulse 71   Temp 97.4 F (36.3 C) (Oral)   Resp 18   Wt 119 lb (54 kg)   SpO2 98%   BMI 21.42 kg/m   Physical Exam 1245: Physical examination:  Nursing notes reviewed; Vital signs and O2 SAT reviewed;  Constitutional: Well developed, Well nourished, Well hydrated, Uncomfortable appearing; Head:  Normocephalic, atraumatic; Eyes: EOMI, PERRL, No scleral icterus; ENMT: Mouth and pharynx normal, Mucous membranes moist; Neck: Supple, Full range of motion, No lymphadenopathy; Cardiovascular: Regular rate and rhythm, No gallop; Respiratory: Breath sounds clear & equal bilaterally, No wheezes.  Speaking full sentences with ease, Normal respiratory effort/excursion; Chest: Nontender, Movement normal; Abdomen: Soft, +diffuse tenderness to palp. No rebound or guarding. Nondistended, Normal bowel sounds; Genitourinary: No CVA tenderness; Extremities: Pulses normal, No tenderness, No edema, No calf edema or asymmetry.; Neuro: AA&Ox3, Major CN grossly intact.  Speech clear. No gross focal motor or sensory deficits in extremities.; Skin: Color normal, Warm, Dry.   ED Treatments / Results  Labs (all labs ordered are listed, but only abnormal results are displayed)   EKG  EKG Interpretation None       Radiology   Procedures Procedures (including critical care time)  Medications Ordered in ED Medications  dicyclomine (BENTYL) injection 20 mg (not administered)  famotidine (PEPCID) IVPB 20 mg premix (not administered)  ondansetron (ZOFRAN) injection 4 mg (not administered)     Initial Impression / Assessment and Plan / ED Course  I have reviewed the triage vital signs and the nursing notes.  Pertinent labs & imaging results that were available during my care of the patient were reviewed by me and considered in my medical decision making  (see chart for details).  MDM Reviewed: previous chart, nursing note and vitals Reviewed previous: labs Interpretation: labs, x-ray and CT scan   Results for orders placed or performed during the hospital encounter of 08/21/16  Urinalysis, Routine w reflex microscopic  Result Value Ref Range   Color, Urine YELLOW YELLOW   APPearance CLEAR CLEAR   Specific Gravity, Urine 1.010 1.005 - 1.030   pH 7.5 5.0 - 8.0   Glucose, UA NEGATIVE NEGATIVE mg/dL   Hgb urine dipstick TRACE (A) NEGATIVE   Bilirubin Urine NEGATIVE NEGATIVE   Ketones, ur NEGATIVE NEGATIVE mg/dL   Protein, ur NEGATIVE NEGATIVE mg/dL   Nitrite NEGATIVE NEGATIVE   Leukocytes, UA NEGATIVE NEGATIVE  Comprehensive metabolic panel  Result Value Ref Range   Sodium 138 135 - 145 mmol/L   Potassium 3.7 3.5 - 5.1 mmol/L   Chloride 107 101 - 111 mmol/L   CO2 25 22 - 32  mmol/L   Glucose, Bld 142 (H) 65 - 99 mg/dL   BUN 8 6 - 20 mg/dL   Creatinine, Ser 0.79 0.44 - 1.00 mg/dL   Calcium 8.5 (L) 8.9 - 10.3 mg/dL   Total Protein 7.1 6.5 - 8.1 g/dL   Albumin 3.9 3.5 - 5.0 g/dL   AST 13 (L) 15 - 41 U/L   ALT 17 14 - 54 U/L   Alkaline Phosphatase 112 38 - 126 U/L   Total Bilirubin 0.5 0.3 - 1.2 mg/dL   GFR calc non Af Amer >60 >60 mL/min   GFR calc Af Amer >60 >60 mL/min   Anion gap 6 5 - 15  Lipase, blood  Result Value Ref Range   Lipase 20 11 - 51 U/L  Lactic acid, plasma  Result Value Ref Range   Lactic Acid, Venous 0.6 0.5 - 1.9 mmol/L  CBC with Differential  Result Value Ref Range   WBC 7.9 4.0 - 10.5 K/uL   RBC 4.32 3.87 - 5.11 MIL/uL   Hemoglobin 13.6 12.0 - 15.0 g/dL   HCT 40.6 36.0 - 46.0 %   MCV 94.0 78.0 - 100.0 fL   MCH 31.5 26.0 - 34.0 pg   MCHC 33.5 30.0 - 36.0 g/dL   RDW 13.7 11.5 - 15.5 %   Platelets 269 150 - 400 K/uL   Neutrophils Relative % 78 %   Neutro Abs 6.2 1.7 - 7.7 K/uL   Lymphocytes Relative 14 %   Lymphs Abs 1.1 0.7 - 4.0 K/uL   Monocytes Relative 7 %   Monocytes Absolute 0.6 0.1 -  1.0 K/uL   Eosinophils Relative 0 %   Eosinophils Absolute 0.0 0.0 - 0.7 K/uL   Basophils Relative 1 %   Basophils Absolute 0.0 0.0 - 0.1 K/uL  Phenytoin level, total  Result Value Ref Range   Phenytoin Lvl 16.4 10.0 - 20.0 ug/mL  Urine microscopic-add on  Result Value Ref Range   Squamous Epithelial / LPF 0-5 (A) NONE SEEN   WBC, UA 0-5 0 - 5 WBC/hpf   RBC / HPF 0-5 0 - 5 RBC/hpf   Bacteria, UA RARE (A) NONE SEEN   Ct Abdomen Pelvis Wo Contrast Result Date: 08/21/2016 CLINICAL DATA:  Initial evaluation for acute nausea and vomiting with abdominal pain. Last bowel movement 1 week ago. EXAM: CT ABDOMEN AND PELVIS WITHOUT CONTRAST TECHNIQUE: Multidetector CT imaging of the abdomen and pelvis was performed following the standard protocol without IV contrast. COMPARISON:  Prior CT at 12/17. FINDINGS: Lower chest: Hazy subsegmental atelectasis and/or scarring present within the low visualized lung bases. 3 mm nodule within the right lower lobe noted (series 6, image 3). This is stable from recent CT, but new from 2011. Hepatobiliary: Limited noncontrast evaluation of the liver is unremarkable. Gallbladder surgically absent. Intra and extrahepatic biliary dilatation is similar, likely chronic and related to post cholecystectomy changes. Pancreas: Pancreas within normal limits. Spleen: Spleen within normal limits. Adrenals/Urinary Tract: 3.0 x 2.1 cm left adrenal lesion, stable from prior, and most compatible with a probable adenoma. Right adrenal gland unremarkable. Kidneys equal in size without evidence of nephrolithiasis or hydronephrosis. No stones seen along the course of either ureter. Urinary bladder within normal limits. Stomach/Bowel: Stomach within normal limits. No evidence for bowel obstruction. No acute inflammatory changes about the small bowel. Moderate amount of retained stool within the ascending, transverse, and proximal descending colon, suggesting constipation. Distally, there is  apparent circumferential wall thickening with hazy stranding about  the sigmoid colon, suggestive of possible acute colitis (series 2, image 51). Vascular/Lymphatic: Prominent aorto bi-iliac atherosclerotic calcifications. No aneurysm. No appreciable adenopathy. Reproductive: Uterus is absent.  Ovaries not discretely identified. Other: No free air or fluid. Musculoskeletal: No acute osseous abnormality. S1 no worrisome lytic or blastic osseous lesions. IMPRESSION: 1. Circumferential wall thickening with hazy fat stranding about the sigmoid colon, suggestive of acute colitis. 2. Moderate amount of retained stool within the colon proximally, suggesting constipation. 3. 3 cm left adrenal lesion, stable from recent CT. Again, given the relative size difference from 2011, further evaluation with adrenal mass protocol CT for complete evaluation is suggested. 4. Aortic atherosclerosis. 5. 3 mm right lower lobe pulmonary nodule, indeterminate. This is stable from recent CT from 08/09/2016, but new relative to 2011. No follow-up needed if patient is low-risk. Non-contrast chest CT can be considered in 12 months if patient is high-risk. This recommendation follows the consensus statement: Guidelines for Management of Incidental Pulmonary Nodules Detected on CT Images: From the Fleischner Society 2017; Radiology 2017; 284:228-243. Electronically Signed   By: Jeannine Boga M.D.   On: 08/21/2016 15:44   Dg Chest 2 View Result Date: 08/21/2016 CLINICAL DATA:  65 y/o  F; nausea and vomiting with abdominal pain EXAM: CHEST  2 VIEW COMPARISON:  01/19/2016 chest radiograph FINDINGS: Stable cardiac silhouette within normal limits. Stable scarring at the lung bases. No focal consolidation. Lungs are hyperinflated with flattened diaphragms consistent with COPD. No pleural effusion or pneumothorax. Multilevel degenerative changes of the thoracic spine. Right upper quadrant surgical clips, presumably cholecystectomy IMPRESSION:  No active cardiopulmonary disease. Electronically Signed   By: Kristine Garbe M.D.   On: 08/21/2016 13:33    1555:  CT as above; IV cipro/flagyl started.  Dx and testing d/w pt and family.  Questions answered.  Verb understanding, agreeable to admit. T/C to [facility]. Lorin Mercy, case discussed, including:  HPI, pertinent PM/SHx, VS/PE, dx testing, ED course and treatment:  Agreeable to admit, requests to write temporary orders, obtain observation medical bed to team APAdmits.   Final Clinical Impressions(s) / ED Diagnoses   Final diagnoses:  None    New Prescriptions New Prescriptions   No medications on file     Francine Graven, DO 08/25/16 [id number]

## 2016-08-21 NOTE — ED Triage Notes (Addendum)
Nausea and vomiting with abdominal pain that started yesterday. Reports of last normal BM 1 week ago. States she had small amount today. 4 mg Zofran give IV per EMS en route.

## 2016-08-21 NOTE — ED Notes (Signed)
Patient refuses to drink po contrast. States it makes her nauseated.

## 2016-08-21 NOTE — H&P (Signed)
History and Physical    [name] [id number] DOB: Jun 05, 1951 DOA: 08/21/2016  PCP: Robert Bellow, MD Consultants:  None Patient coming from: home - lives with husband  Chief Complaint: abdominal pain, emesis  HPI: [name] is a 65 y.o. female with medical history significant of seizures, IBS, PUD, COPD with occasional home O2 use, bipolar disorder, and alcohol abuse in remission.  Patient reports that she has had refractory n/v/d today.  Unable to eat anything, gets sick.  N/V/D.  Symptoms just today.  Simliar symptoms a week ago prompting an ER visit.  Fine from the time of discharge until today.  No blood in stools/vomit.  No sick contacts.  No fever.  Has voided a few times today.   ED Course: Per Dr. Thurnell Garbe: [id number]:  CT as above; IV cipro/flagyl started.  Dx and testing d/w pt and family.  Questions answered.  Verb understanding, agreeable to admit. T/C to [facility] Dr. Lorin Mercy, case discussed, including:  HPI, pertinent PM/SHx, VS/PE, dx testing, ED course and treatment:  Agreeable to admit, requests to write temporary orders, obtain observation medical bed to team APAdmits.  Review of Systems: As per HPI; otherwise 10 point review of systems reviewed and negative.   Ambulatory Status:  ambuilates indepednently but sometimes uses a cane  Past Medical History:  Diagnosis Date  . Alcohol abuse, in remission   . Anxiety   . Bipolar disorder ([place])   . COPD (chronic obstructive pulmonary disease) ([place])    occasional home O2  . Gastric ulcer   . Hemorrhoids   . IBS (irritable bowel syndrome)   . Seizures ([place])    last seizure was about a year ago    Past Surgical History:  Procedure Laterality Date  . ABDOMINAL HYSTERECTOMY    . CHOLECYSTECTOMY    . EYE SURGERY      Social History   Social History  . Marital status: Married    Spouse name: N/A  . Number of children: N/A  . Years of education: N/A   Occupational History  . retired    Social History Main  Topics  . Smoking status: Current Every Day Smoker    Packs/day: 0.25    Years: 32.00    Types: Cigarettes  . Smokeless tobacco: Never Used  . Alcohol use No     Comment: h/o heavy use, quit in 1995  . Drug use:     Frequency: 2.0 times per week    Types: Marijuana     Comment: last used days ago  . Sexual activity: Not on file   Other Topics Concern  . Not on file   Social History Narrative  . No narrative on file    Allergies  Allergen Reactions  . Flu Virus Vaccine Nausea And Vomiting  . Lithium Nausea And Vomiting  . Varenicline Tartrate Hives    Family History  Problem Relation Age of Onset  . Dementia Father     Prior to Admission medications   Medication Sig Start Date End Date Taking? Authorizing Provider  albuterol (PROVENTIL HFA;VENTOLIN HFA) 108 (90 BASE) MCG/ACT inhaler Inhale 2 puffs into the lungs every 6 (six) hours as needed for wheezing or shortness of breath.     Historical Provider, MD  albuterol (PROVENTIL) (2.5 MG/3ML) 0.083% nebulizer solution Take 2.5 mg by nebulization every 6 (six) hours as needed for wheezing or shortness of breath.     Historical Provider, MD  clonazePAM (KLONOPIN) 1 MG tablet Take  1 mg by mouth 3 (three) times daily.     Historical Provider, MD  dicyclomine (BENTYL) 10 MG capsule Take 10 mg by mouth 2 (two) times daily.    Historical Provider, MD  omeprazole (PRILOSEC) 40 MG capsule Take 40 mg by mouth daily.    Historical Provider, MD  ondansetron (ZOFRAN ODT) 4 MG disintegrating tablet Take 1 tablet (4 mg total) by mouth every 8 (eight) hours as needed for nausea or vomiting. 08/09/16   Sherwood Gambler, MD  phenytoin (DILANTIN) 100 MG ER capsule Take 400 mg by mouth at bedtime.     Historical Provider, MD  traZODone (DESYREL) 50 MG tablet Take 50-150 mg by mouth at bedtime as needed for sleep.     Historical Provider, MD    Physical Exam: Vitals:   08/21/16 1630 08/21/16 1710 08/21/16 2051 08/21/16 2100  BP: 162/92 (!)  175/83  (!) 170/80  Pulse: 73 98  82  Resp: 22 20  20   Temp:  97.9 F (36.6 C)  98.7 F (37.1 C)  TempSrc:  Axillary  Oral  SpO2: 93% 100% 94% 100%  Weight:  52.3 kg (115 lb 3.2 oz)    Height:  5' 3.5" (1.613 m)       General: Appears ill, older than stated age Eyes:  PERRL, EOMI, normal lids, iris ENT:  grossly normal hearing, lips & tongue, mmm, bulbous nose Neck:  no LAD, masses or thyromegaly Cardiovascular:  RRR, no m/r/g. No LE edema.  Respiratory:  CTA bilaterally, no w/r/r. Normal respiratory effort. Abdomen:  soft, mildly diffusely tender to palpation, nd, NABS Skin:  no rash or induration seen on limited exam Musculoskeletal:  grossly normal tone BUE/BLE, good ROM, no bony abnormality Psychiatric:  grossly normal mood and affect, speech fluent and appropriate, AOx3 Neurologic:  CN 2-12 grossly intact, moves all extremities in coordinated fashion, sensation intact  Labs on Admission: I have personally reviewed following labs and imaging studies  CBC:  Recent Labs Lab 08/21/16 1311  WBC 7.9  NEUTROABS 6.2  HGB 13.6  HCT 40.6  MCV 94.0  PLT Q000111Q   Basic Metabolic Panel:  Recent Labs Lab 08/21/16 1311  NA 138  K 3.7  CL 107  CO2 25  GLUCOSE 142*  BUN 8  CREATININE 0.79  CALCIUM 8.5*   GFR: Estimated Creatinine Clearance: 57.1 mL/min (by C-G formula based on SCr of 0.79 mg/dL). Liver Function Tests:  Recent Labs Lab 08/21/16 1311  AST 13*  ALT 17  ALKPHOS 112  BILITOT 0.5  PROT 7.1  ALBUMIN 3.9    Recent Labs Lab 08/21/16 1311  LIPASE 20   No results for input(s): AMMONIA in the last 168 hours. Coagulation Profile: No results for input(s): INR, PROTIME in the last 168 hours. Cardiac Enzymes: No results for input(s): CKTOTAL, CKMB, CKMBINDEX, TROPONINI in the last 168 hours. BNP (last 3 results) No results for input(s): PROBNP in the last 8760 hours. HbA1C: No results for input(s): HGBA1C in the last 72 hours. CBG: No results for  input(s): GLUCAP in the last 168 hours. Lipid Profile: No results for input(s): CHOL, HDL, LDLCALC, TRIG, CHOLHDL, LDLDIRECT in the last 72 hours. Thyroid Function Tests: No results for input(s): TSH, T4TOTAL, FREET4, T3FREE, THYROIDAB in the last 72 hours. Anemia Panel: No results for input(s): VITAMINB12, FOLATE, FERRITIN, TIBC, IRON, RETICCTPCT in the last 72 hours. Urine analysis:    Component Value Date/Time   COLORURINE YELLOW 08/21/2016 [place] 08/21/2016 1434  LABSPEC 1.010 08/21/2016 1434   PHURINE 7.5 08/21/2016 1434   GLUCOSEU NEGATIVE 08/21/2016 1434   HGBUR TRACE (A) 08/21/2016 1434   BILIRUBINUR NEGATIVE 08/21/2016 1434   KETONESUR NEGATIVE 08/21/2016 1434   PROTEINUR NEGATIVE 08/21/2016 1434   UROBILINOGEN 0.2 11/12/2012 1530   NITRITE NEGATIVE 08/21/2016 1434   LEUKOCYTESUR NEGATIVE 08/21/2016 1434    Creatinine Clearance: Estimated Creatinine Clearance: 57.1 mL/min (by C-G formula based on SCr of 0.79 mg/dL).  Sepsis Labs: @LABRCNTIP (procalcitonin:4,lacticidven:4) )No results found for this or any previous visit (from the past 240 hour(s)).   Radiological Exams on Admission: Ct Abdomen Pelvis Wo Contrast  Result Date: 08/21/2016 CLINICAL DATA:  Initial evaluation for acute nausea and vomiting with abdominal pain. Last bowel movement 1 week ago. EXAM: CT ABDOMEN AND PELVIS WITHOUT CONTRAST TECHNIQUE: Multidetector CT imaging of the abdomen and pelvis was performed following the standard protocol without IV contrast. COMPARISON:  Prior CT at 12/17. FINDINGS: Lower chest: Hazy subsegmental atelectasis and/or scarring present within the low visualized lung bases. 3 mm nodule within the right lower lobe noted (series 6, image 3). This is stable from recent CT, but new from 2011. Hepatobiliary: Limited noncontrast evaluation of the liver is unremarkable. Gallbladder surgically absent. Intra and extrahepatic biliary dilatation is similar, likely chronic  and related to post cholecystectomy changes. Pancreas: Pancreas within normal limits. Spleen: Spleen within normal limits. Adrenals/Urinary Tract: 3.0 x 2.1 cm left adrenal lesion, stable from prior, and most compatible with a probable adenoma. Right adrenal gland unremarkable. Kidneys equal in size without evidence of nephrolithiasis or hydronephrosis. No stones seen along the course of either ureter. Urinary bladder within normal limits. Stomach/Bowel: Stomach within normal limits. No evidence for bowel obstruction. No acute inflammatory changes about the small bowel. Moderate amount of retained stool within the ascending, transverse, and proximal descending colon, suggesting constipation. Distally, there is apparent circumferential wall thickening with hazy stranding about the sigmoid colon, suggestive of possible acute colitis (series 2, image 51). Vascular/Lymphatic: Prominent aorto bi-iliac atherosclerotic calcifications. No aneurysm. No appreciable adenopathy. Reproductive: Uterus is absent.  Ovaries not discretely identified. Other: No free air or fluid. Musculoskeletal: No acute osseous abnormality. S1 no worrisome lytic or blastic osseous lesions. IMPRESSION: 1. Circumferential wall thickening with hazy fat stranding about the sigmoid colon, suggestive of acute colitis. 2. Moderate amount of retained stool within the colon proximally, suggesting constipation. 3. 3 cm left adrenal lesion, stable from recent CT. Again, given the relative size difference from 2011, further evaluation with adrenal mass protocol CT for complete evaluation is suggested. 4. Aortic atherosclerosis. 5. 3 mm right lower lobe pulmonary nodule, indeterminate. This is stable from recent CT from 08/09/2016, but new relative to 2011. No follow-up needed if patient is low-risk. Non-contrast chest CT can be considered in 12 months if patient is high-risk. This recommendation follows the consensus statement: Guidelines for Management of  Incidental Pulmonary Nodules Detected on CT Images: From the Fleischner Society 2017; Radiology 2017; 284:228-243. Electronically Signed   By: Jeannine Boga M.D.   On: 08/21/2016 15:44   Dg Chest 2 View  Result Date: 08/21/2016 CLINICAL DATA:  65 y/o  F; nausea and vomiting with abdominal pain EXAM: CHEST  2 VIEW COMPARISON:  01/19/2016 chest radiograph FINDINGS: Stable cardiac silhouette within normal limits. Stable scarring at the lung bases. No focal consolidation. Lungs are hyperinflated with flattened diaphragms consistent with COPD. No pleural effusion or pneumothorax. Multilevel degenerative changes of the thoracic spine. Right upper quadrant surgical clips, presumably cholecystectomy  IMPRESSION: No active cardiopulmonary disease. Electronically Signed   By: Kristine Garbe M.D.   On: 08/21/2016 13:33    EKG: not done  Assessment/Plan Principal Problem:   Colitis Active Problems:   Adrenal mass, left (HCC)   Solitary pulmonary nodule   Tobacco use disorder   Colitis -Patient appears ill, presenting with abdominal pain, n/v/d and colitis by CT -Will order GI pathogen panel and C-diff testing -Cipro/Flagyl for empiric coverage for now -No evidence of dehydration, so patient presented early enough to prevent this complication; as such, she may be ready for discharge in 24-48 hours and so observation status is appropriate -Zofran prn -IVF -Clear liquids, advance to regular diet as tolerated  Adrenal mass -Stable but requires outpatient f/u  Pulmonary nodule -Also stable, but given long-standing tobacco use, needs annual f/u  Tobacco dependence -Encourage cessation.  This was discussed with the patient and should be reviewed on an ongoing basis.   -Patch ordered .  DVT prophylaxis:  Lovenox Code Status: Full - confirmed with patient Family Communication: None present Disposition Plan:  Home once clinically improved Consults called: None  Admission status:  It is my clinical opinion that referral for OBSERVATION is reasonable and necessary in this patient based on the above information provided. The aforementioned taken together are felt to place the patient at high risk for further clinical deterioration. However it is anticipated that the patient may be medically stable for discharge from the hospital within 24 to 48 hours.     Karmen Bongo MD [facility]  If 7PM-7AM, please contact night-coverage [email or web address] Password [email or web address]  08/21/2016, 10:46 PM

## 2016-08-21 NOTE — Progress Notes (Signed)
Pharmacy Antibiotic Note  [name] is a 65 y.o. female admitted on 08/21/2016 with intraabdominal infection.  Pharmacy has been consulted for cipro dosing.  Plan: Cont cipro 400 mg IV q12 hours F/u renal function, cultures and clinical course  Height: 5' 3.5" (161.3 cm) Weight: 115 lb 3.2 oz (52.3 kg) IBW/kg (Calculated) : 53.55  Temp (24hrs), Avg:97.7 F (36.5 C), Min:97.4 F (36.3 C), Max:97.9 F (36.6 C)   Recent Labs Lab 08/21/16 1311 08/21/16 1540  WBC 7.9  --   CREATININE 0.79  --   LATICACIDVEN 0.6 0.7    Estimated Creatinine Clearance: 57.1 mL/min (by C-G formula based on SCr of 0.79 mg/dL).    Allergies  Allergen Reactions  . Flu Virus Vaccine Nausea And Vomiting  . Lithium Nausea And Vomiting  . Varenicline Tartrate Hives    Antimicrobials this admission: cipro 10/24 >>  flagyl 10/24 >>    Thank you for allowing pharmacy to be a part of this patient's care.  Beverlee Nims 08/21/2016 6:16 PM

## 2016-08-22 LAB — BASIC METABOLIC PANEL
Anion gap: 8 (ref 5–15)
BUN: 10 mg/dL (ref 6–20)
CALCIUM: 8.4 mg/dL — AB (ref 8.9–10.3)
CHLORIDE: 103 mmol/L (ref 101–111)
CO2: 25 mmol/L (ref 22–32)
CREATININE: 0.78 mg/dL (ref 0.44–1.00)
Glucose, Bld: 122 mg/dL — ABNORMAL HIGH (ref 65–99)
Potassium: 3.2 mmol/L — ABNORMAL LOW (ref 3.5–5.1)
SODIUM: 136 mmol/L (ref 135–145)

## 2016-08-22 LAB — CBC
HCT: 40 % (ref 36.0–46.0)
Hemoglobin: 13.5 g/dL (ref 12.0–15.0)
MCH: 31.7 pg (ref 26.0–34.0)
MCHC: 33.8 g/dL (ref 30.0–36.0)
MCV: 93.9 fL (ref 78.0–100.0)
PLATELETS: 253 10*3/uL (ref 150–400)
RBC: 4.26 MIL/uL (ref 3.87–5.11)
RDW: 13.8 % (ref 11.5–15.5)
WBC: 14.3 10*3/uL — AB (ref 4.0–10.5)

## 2016-08-22 LAB — RAPID URINE DRUG SCREEN, HOSP PERFORMED
AMPHETAMINES: NOT DETECTED
BENZODIAZEPINES: POSITIVE — AB
Barbiturates: NOT DETECTED
COCAINE: NOT DETECTED
OPIATES: NOT DETECTED
Tetrahydrocannabinol: POSITIVE — AB

## 2016-08-22 NOTE — Progress Notes (Signed)
Pt requested to leave AMA. RN explained information and the risks of leaving AMA. MD notified and aware. PT signed AMA form and placed in Chart. IV catheter D/C. Pt left hospital with family members. Oswald Hillock, RN

## 2016-08-22 NOTE — Discharge Summary (Signed)
Physician Discharge Summary  [name] [id number] DOB: 24-Jul-1951 DOA: 08/21/2016  PCP: Robert Bellow, MD  Admit date: 08/21/2016 Discharge date: 08/22/2016  Admitted From: Home.  Disposition:  Home.   Recommendations for Outpatient Follow-up:  1. Follow up with PCP in 1-2 weeks  Home Health: None. Equipment/Devices: None.  Discharge Condition: Same.  CODE STATUS: FULL  Diet recommendation: None recommnended.   Brief/Interim Summary: Patient was admitted for colitis, started on IV antibiotics, and stools were sent and is pending.  She was admitted by Dr Lorin Mercy, and as per her H and P:  " HPI: [name] is a 65 y.o. female with medical history significant of seizures, IBS, PUD, COPD with occasional home O2 use, bipolar disorder, and alcohol abuse in remission.  Patient reports that she has had refractory n/v/d today.  Unable to eat anything, gets sick.  N/V/D.  Symptoms just today.  Simliar symptoms a week ago prompting an ER visit.  Fine from the time of discharge until today.  No blood in stools/vomit.  No sick contacts.  No fever.  Has voided a few times today.   ED Course: Per Dr. Thurnell Garbe: [id number]: CT as above; IV cipro/flagyl started. Dx and testing d/w pt and family. Questions answered. Verb understanding, agreeable to admit. T/C to [facility] Dr. Lorin Mercy, case discussed, including: HPI, pertinent PM/SHx, VS/PE, dx testing, ED course and treatment: Agreeable to admit, requests to write temporary orders, obtain observation medicalbed to team APAdmits.  HOSPITAL COURSE:  Patient was Tx with IVF and IV antibiotics.  Her stools for C diff and studies were still pending.  She no longer wanted to be in the hospital, unclear exact etiology.  She left AMA prior to being seen today, despite urging her to wait for re-evaluation, continued Tx, and for results of studies.   Discharge Diagnoses:  Principal Problem:   Colitis Active Problems:   Adrenal mass, left (HCC)    Solitary pulmonary nodule   Tobacco use disorder    Discharge Instructions:  See PCP in follow up.    Allergies  Allergen Reactions  . Flu Virus Vaccine Nausea And Vomiting  . Lithium Nausea And Vomiting  . Varenicline Tartrate Hives    Consultations:  None.    Procedures/Studies: Ct Abdomen Pelvis Wo Contrast  Result Date: 08/21/2016 CLINICAL DATA:  Initial evaluation for acute nausea and vomiting with abdominal pain. Last bowel movement 1 week ago. EXAM: CT ABDOMEN AND PELVIS WITHOUT CONTRAST TECHNIQUE: Multidetector CT imaging of the abdomen and pelvis was performed following the standard protocol without IV contrast. COMPARISON:  Prior CT at 12/17. FINDINGS: Lower chest: Hazy subsegmental atelectasis and/or scarring present within the low visualized lung bases. 3 mm nodule within the right lower lobe noted (series 6, image 3). This is stable from recent CT, but new from 2011. Hepatobiliary: Limited noncontrast evaluation of the liver is unremarkable. Gallbladder surgically absent. Intra and extrahepatic biliary dilatation is similar, likely chronic and related to post cholecystectomy changes. Pancreas: Pancreas within normal limits. Spleen: Spleen within normal limits. Adrenals/Urinary Tract: 3.0 x 2.1 cm left adrenal lesion, stable from prior, and most compatible with a probable adenoma. Right adrenal gland unremarkable. Kidneys equal in size without evidence of nephrolithiasis or hydronephrosis. No stones seen along the course of either ureter. Urinary bladder within normal limits. Stomach/Bowel: Stomach within normal limits. No evidence for bowel obstruction. No acute inflammatory changes about the small bowel. Moderate amount of retained stool within the ascending, transverse, and proximal descending  colon, suggesting constipation. Distally, there is apparent circumferential wall thickening with hazy stranding about the sigmoid colon, suggestive of possible acute colitis (series 2,  image 51). Vascular/Lymphatic: Prominent aorto bi-iliac atherosclerotic calcifications. No aneurysm. No appreciable adenopathy. Reproductive: Uterus is absent.  Ovaries not discretely identified. Other: No free air or fluid. Musculoskeletal: No acute osseous abnormality. S1 no worrisome lytic or blastic osseous lesions. IMPRESSION: 1. Circumferential wall thickening with hazy fat stranding about the sigmoid colon, suggestive of acute colitis. 2. Moderate amount of retained stool within the colon proximally, suggesting constipation. 3. 3 cm left adrenal lesion, stable from recent CT. Again, given the relative size difference from 2011, further evaluation with adrenal mass protocol CT for complete evaluation is suggested. 4. Aortic atherosclerosis. 5. 3 mm right lower lobe pulmonary nodule, indeterminate. This is stable from recent CT from 08/09/2016, but new relative to 2011. No follow-up needed if patient is low-risk. Non-contrast chest CT can be considered in 12 months if patient is high-risk. This recommendation follows the consensus statement: Guidelines for Management of Incidental Pulmonary Nodules Detected on CT Images: From the Fleischner Society 2017; Radiology 2017; 284:228-243. Electronically Signed   By: Jeannine Boga M.D.   On: 08/21/2016 15:44   Dg Chest 2 View  Result Date: 08/21/2016 CLINICAL DATA:  65 y/o  F; nausea and vomiting with abdominal pain EXAM: CHEST  2 VIEW COMPARISON:  01/19/2016 chest radiograph FINDINGS: Stable cardiac silhouette within normal limits. Stable scarring at the lung bases. No focal consolidation. Lungs are hyperinflated with flattened diaphragms consistent with COPD. No pleural effusion or pneumothorax. Multilevel degenerative changes of the thoracic spine. Right upper quadrant surgical clips, presumably cholecystectomy IMPRESSION: No active cardiopulmonary disease. Electronically Signed   By: Kristine Garbe M.D.   On: 08/21/2016 13:33   Ct Abdomen  Pelvis W Contrast  Result Date: 08/09/2016 CLINICAL DATA:  Right lower quadrant pain with nausea, vomiting and diarrhea beginning today. EXAM: CT ABDOMEN AND PELVIS WITH CONTRAST TECHNIQUE: Multidetector CT imaging of the abdomen and pelvis was performed using the standard protocol following bolus administration of intravenous contrast. CONTRAST:  165mL ISOVUE-300 IOPAMIDOL (ISOVUE-300) INJECTION 61% COMPARISON:  04/20/2010 FINDINGS: Lower chest: Linear scarring and/or atelectasis at the lung bases. No pleural or pericardial fluid. Coronary artery calcification. Hepatobiliary: Previous cholecystectomy. Intra and extra hepatic ductal dilatation typical of that. No calcified ductal stone. Pancreas: Normal Spleen: Normal Adrenals/Urinary Tract: Left adrenal mass has enlarged, now measuring 3.2 x 2.2 cm. Contrast seems to washout fairly aggressively on the delayed images. This probably represents a benign adenoma, but the enlargement is concerning. Formal dynamic CT study with washout calculations recommended. Right adrenal gland is normal. Both kidneys are normal except for a 7 mm cyst in the left kidney. Stomach/Bowel: The appendix is normal. No other bowel pathology is seen. Vascular/Lymphatic: Aortic atherosclerosis. No aneurysm. The IVC is normal. No retroperitoneal mass or lymphadenopathy. Reproductive: Previous hysterectomy.  No pelvic mass PE Other: No free fluid or air. Musculoskeletal: Chronic lumbar spine degenerative changes. IMPRESSION: No abnormality seen to explain acute symptoms. Previous hysterectomy. Chronic intra and extra hepatic biliary ductal dilatation. **An incidental finding of potential clinical significance has been found. 3.2 x 2.2 x 3.1cm left adrenal mass, enlarged from 1.5 cm in 2011. I think this probably represents an adrenal adenoma, but the enlargement is concerning. Formal CT study with washout calculation is suggested.** Aortic atherosclerosis. Normal appearing appendix.  Electronically Signed   By: Nelson Chimes M.D.   On: 08/09/2016 19:59  Vitals:   08/21/16 2100 08/22/16 0531  BP: (!) 170/80 127/70  Pulse: 82 86  Resp: 20 20  Temp: 98.7 F (37.1 C) 99.4 F (37.4 C)   Vitals:   08/21/16 1710 08/21/16 2051 08/21/16 2100 08/22/16 0531  BP: (!) 175/83  (!) 170/80 127/70  Pulse: 98  82 86  Resp: 20  20 20   Temp: 97.9 F (36.6 C)  98.7 F (37.1 C) 99.4 F (37.4 C)  TempSrc: Axillary  Oral Oral  SpO2: 100% 94% 100% 98%  Weight: 52.3 kg (115 lb 3.2 oz)     Height: 5' 3.5" (1.613 m)       The results of significant diagnostics from this hospitalization (including imaging, microbiology, ancillary and laboratory) are listed below for reference.     Recent Labs  09/29/15 0557  BNP Q000111Q   Basic Metabolic Panel:  Recent Labs Lab 08/21/16 1311 08/22/16 0651  NA 138 136  K 3.7 3.2*  CL 107 103  CO2 25 25  GLUCOSE 142* 122*  BUN 8 10  CREATININE 0.79 0.78  CALCIUM 8.5* 8.4*   Liver Function Tests:  Recent Labs Lab 08/21/16 1311  AST 13*  ALT 17  ALKPHOS 112  BILITOT 0.5  PROT 7.1  ALBUMIN 3.9    Recent Labs Lab 08/21/16 1311  LIPASE 20   CBC:  Recent Labs Lab 08/21/16 1311 08/22/16 0651  WBC 7.9 14.3*  NEUTROABS 6.2  --   HGB 13.6 13.5  HCT 40.6 40.0  MCV 94.0 93.9  PLT 269 253   Urinalysis    Component Value Date/Time   COLORURINE YELLOW 08/21/2016 [place] 08/21/2016 1434   LABSPEC 1.010 08/21/2016 1434   PHURINE 7.5 08/21/2016 1434   GLUCOSEU NEGATIVE 08/21/2016 1434   HGBUR TRACE (A) 08/21/2016 1434   [place] 08/21/2016 1434   [place] 08/21/2016 1434   PROTEINUR NEGATIVE 08/21/2016 1434   UROBILINOGEN 0.2 11/12/2012 1530   NITRITE NEGATIVE 08/21/2016 1434   LEUKOCYTESUR NEGATIVE 08/21/2016 1434   SIGNED:  Orvan Falconer, MD FACP [facility] 08/22/2016, 3:24 PM   If 7PM-7AM, please contact night-coverage [email or web address] Password [email or web address]

## 2016-08-22 NOTE — Care Management Obs Status (Signed)
MEDICARE OBSERVATION STATUS NOTIFICATION   Patient Details  Name: ZUREE WILKINSON MRN: [id number] Date of Birth: 10/01/1951   Medicare Observation Status Notification Given:  Yes (signed earlier this morning)    Wynn Alldredge, Chauncey [place], RN 08/22/2016, 1:50 PM

## 2016-08-22 NOTE — Care Management (Signed)
Patient signed out AMA, seen by CM prior to leaving. Patient ind with ADL's. No CM needs.

## 2016-08-23 ENCOUNTER — Emergency Department (HOSPITAL_COMMUNITY): Payer: Medicare HMO

## 2016-08-23 ENCOUNTER — Encounter (HOSPITAL_COMMUNITY): Payer: Self-pay | Admitting: Emergency Medicine

## 2016-08-23 ENCOUNTER — Emergency Department (HOSPITAL_COMMUNITY)
Admission: EM | Admit: 2016-08-23 | Discharge: 2016-08-23 | Disposition: A | Discharge status: Home / Self Care | Payer: Medicare HMO | Attending: Emergency Medicine | Admitting: Emergency Medicine

## 2016-08-23 DIAGNOSIS — J449 Chronic obstructive pulmonary disease, unspecified: Secondary | ICD-10-CM | POA: Diagnosis not present

## 2016-08-23 DIAGNOSIS — Z79899 Other long term (current) drug therapy: Secondary | ICD-10-CM | POA: Diagnosis not present

## 2016-08-23 DIAGNOSIS — K529 Noninfective gastroenteritis and colitis, unspecified: Secondary | ICD-10-CM | POA: Insufficient documentation

## 2016-08-23 DIAGNOSIS — F17210 Nicotine dependence, cigarettes, uncomplicated: Secondary | ICD-10-CM | POA: Diagnosis not present

## 2016-08-23 DIAGNOSIS — R05 Cough: Secondary | ICD-10-CM | POA: Insufficient documentation

## 2016-08-23 DIAGNOSIS — R109 Unspecified abdominal pain: Secondary | ICD-10-CM | POA: Diagnosis present

## 2016-08-23 LAB — CBC
HEMATOCRIT: 35.5 % — AB (ref 36.0–46.0)
HEMOGLOBIN: 12 g/dL (ref 12.0–15.0)
MCH: 31.7 pg (ref 26.0–34.0)
MCHC: 33.8 g/dL (ref 30.0–36.0)
MCV: 93.9 fL (ref 78.0–100.0)
Platelets: 233 10*3/uL (ref 150–400)
RBC: 3.78 MIL/uL — AB (ref 3.87–5.11)
RDW: 13.7 % (ref 11.5–15.5)
WBC: 11.5 10*3/uL — AB (ref 4.0–10.5)

## 2016-08-23 LAB — URINALYSIS, ROUTINE W REFLEX MICROSCOPIC
Bilirubin Urine: NEGATIVE
GLUCOSE, UA: NEGATIVE mg/dL
HGB URINE DIPSTICK: NEGATIVE
Ketones, ur: NEGATIVE mg/dL
LEUKOCYTES UA: NEGATIVE
Nitrite: NEGATIVE
PROTEIN: NEGATIVE mg/dL
pH: 5.5 (ref 5.0–8.0)

## 2016-08-23 LAB — COMPREHENSIVE METABOLIC PANEL
ALT: 15 U/L (ref 14–54)
ANION GAP: 6 (ref 5–15)
AST: 14 U/L — ABNORMAL LOW (ref 15–41)
Albumin: 3.9 g/dL (ref 3.5–5.0)
Alkaline Phosphatase: 91 U/L (ref 38–126)
BUN: 9 mg/dL (ref 6–20)
CHLORIDE: 106 mmol/L (ref 101–111)
CO2: 26 mmol/L (ref 22–32)
CREATININE: 0.91 mg/dL (ref 0.44–1.00)
Calcium: 8.5 mg/dL — ABNORMAL LOW (ref 8.9–10.3)
Glucose, Bld: 97 mg/dL (ref 65–99)
POTASSIUM: 3.4 mmol/L — AB (ref 3.5–5.1)
SODIUM: 138 mmol/L (ref 135–145)
Total Bilirubin: 0.3 mg/dL (ref 0.3–1.2)
Total Protein: 6.7 g/dL (ref 6.5–8.1)

## 2016-08-23 LAB — URINE CULTURE

## 2016-08-23 LAB — LIPASE, BLOOD: LIPASE: 37 U/L (ref 11–51)

## 2016-08-23 MED ORDER — METRONIDAZOLE 500 MG PO TABS: 500.0000 mg | ORAL_TABLET | Freq: Three times a day (TID) | ORAL | 0 refills | Status: DC

## 2016-08-23 MED ORDER — NICOTINE 21 MG/24HR TD PT24
21.0000 mg | MEDICATED_PATCH | Freq: Once | TRANSDERMAL | Status: DC
  Administered 2016-08-23: 21 mg via TRANSDERMAL
  Filled 2016-08-23: qty 1

## 2016-08-23 MED ORDER — CIPROFLOXACIN HCL 500 MG PO TABS: 500.0000 mg | ORAL_TABLET | Freq: Two times a day (BID) | ORAL | 0 refills | Status: DC

## 2016-08-23 MED ORDER — POTASSIUM CHLORIDE CRYS ER 20 MEQ PO TBCR
40.0000 meq | EXTENDED_RELEASE_TABLET | Freq: Once | ORAL | Status: AC
  Administered 2016-08-23: 40 meq via ORAL
  Filled 2016-08-23: qty 2

## 2016-08-23 MED ORDER — IPRATROPIUM-ALBUTEROL 0.5-2.5 (3) MG/3ML IN SOLN
3.0000 mL | Freq: Once | RESPIRATORY_TRACT | Status: AC
  Administered 2016-08-23: 3 mL via RESPIRATORY_TRACT
  Filled 2016-08-23: qty 3

## 2016-08-23 MED ORDER — CIPROFLOXACIN HCL 250 MG PO TABS
500.0000 mg | ORAL_TABLET | Freq: Once | ORAL | Status: AC
  Administered 2016-08-23: 500 mg via ORAL
  Filled 2016-08-23: qty 2

## 2016-08-23 MED ORDER — METRONIDAZOLE 500 MG PO TABS
500.0000 mg | ORAL_TABLET | Freq: Once | ORAL | Status: AC
  Administered 2016-08-23: 500 mg via ORAL
  Filled 2016-08-23: qty 1

## 2016-08-23 MED ORDER — SODIUM CHLORIDE 0.9 % IV BOLUS (SEPSIS)
2000.0000 mL | Freq: Once | INTRAVENOUS | Status: AC
  Administered 2016-08-23: 2000 mL via INTRAVENOUS

## 2016-08-23 NOTE — ED Notes (Signed)
Patient took gown off and put clothes on. Sitting in chair with visitor at side. Patient stating she wants to leave. Encouraged patient to stay to receive rest of IV fluids. Patient agrees.

## 2016-08-23 NOTE — ED Notes (Signed)
Patient requesting Nicotine patch. MD made aware. Vo orders given per MD to writer.

## 2016-08-23 NOTE — ED Notes (Signed)
In xray

## 2016-08-23 NOTE — Discharge Instructions (Signed)
Take the antibiotics prescribed. Use your albuterol inhaler 2 puffs every 4 hours as needed for shortness of breath or your albuterol nebulizer as needed every 4 hours. Return if needed more than every 4 hours or see Dr.Knowlton. Ask Dr. Karie Kirks to help you to stop smoking.

## 2016-08-23 NOTE — ED Notes (Signed)
MD at bedside. 

## 2016-08-23 NOTE — ED Provider Notes (Signed)
[facility] DEPT Provider Note   CSN: [id number] Arrival date & time: 08/23/16  1941     History   Chief Complaint Chief Complaint  Patient presents with  . Abdominal Pain    HPI [name] is a 65 y.o. female.  HPI Complains of diffuse abdominal pain onset approximately a week ago. Patient had overnight stay for intravenous antibiotics diagnosed with colitis 08/21/2016. She left AMA yesterday without waiting for reexamination, or prescriptions. C. difficile and stool panel is pending however today she reports feeling somewhat constipated and had slight bowel movement this morning. Other associated symptoms include cough productive of yellow sputum for the past 2 days and mild shortness of breath typical of COPD she's had in the past. Her husband reports she ate spaghetti tonight without nausea or vomiting. Nothing makes symptoms better or worse. No other associated symptoms. She reportedly vomited twice earlier today however does not feel nausea present. Past Medical History:  Diagnosis Date  . Alcohol abuse, in remission   . Anxiety   . Bipolar disorder ([place])   . COPD (chronic obstructive pulmonary disease) ([place])    occasional home O2  . Gastric ulcer   . Hemorrhoids   . IBS (irritable bowel syndrome)   . Seizures ([place])    last seizure was about a year ago    Patient Active Problem List   Diagnosis Date Noted  . Colitis 08/21/2016  . Adrenal mass, left ([place]) 08/21/2016  . Solitary pulmonary nodule 08/21/2016  . Tobacco use disorder 08/21/2016  . DYSPEPSIA 06/13/2010  . IRRITABLE BOWEL SYNDROME 06/13/2010  . HEMATOCHEZIA 06/13/2010  . WEIGHT LOSS, ABNORMAL 06/13/2010  . ABDOMINAL PAIN, GENERALIZED 06/13/2010    Past Surgical History:  Procedure Laterality Date  . ABDOMINAL HYSTERECTOMY    . CHOLECYSTECTOMY    . EYE SURGERY      OB History    Gravida Para Term Preterm AB Living   4 4 4     3    SAB TAB Ectopic Multiple Live Births                    Home Medications    Prior to Admission medications   Medication Sig Start Date End Date Taking? Authorizing Provider  albuterol (PROVENTIL) (2.5 MG/3ML) 0.083% nebulizer solution Take 2.5 mg by nebulization every 6 (six) hours as needed for wheezing or shortness of breath.    Yes Historical Provider, MD  clonazePAM (KLONOPIN) 1 MG tablet Take 1 mg by mouth 3 (three) times daily.    Yes Historical Provider, MD  dicyclomine (BENTYL) 10 MG capsule Take 10 mg by mouth 2 (two) times daily.   Yes Historical Provider, MD  phenytoin (DILANTIN) 100 MG ER capsule Take 400 mg by mouth at bedtime.    Yes Historical Provider, MD  traZODone (DESYREL) 50 MG tablet Take 50-150 mg by mouth at bedtime as needed for sleep.    Yes Historical Provider, MD  albuterol (PROVENTIL HFA;VENTOLIN HFA) 108 (90 BASE) MCG/ACT inhaler Inhale 2 puffs into the lungs every 6 (six) hours as needed for wheezing or shortness of breath.     Historical Provider, MD  ondansetron (ZOFRAN ODT) 4 MG disintegrating tablet Take 1 tablet (4 mg total) by mouth every 8 (eight) hours as needed for nausea or vomiting. 08/09/16   Sherwood Gambler, MD    Family History Family History  Problem Relation Age of Onset  . Dementia Father     Social History Social History  Substance  Use Topics  . Smoking status: Current Every Day Smoker    Packs/day: 0.25    Years: 32.00    Types: Cigarettes  . Smokeless tobacco: Never Used  . Alcohol use No     Comment: h/o heavy use, quit in [id number]   No illicit drug use  Allergies   Flu virus vaccine; Lithium; and Varenicline tartrate   Review of Systems Review of Systems  Constitutional: Negative.   HENT: Negative.   Respiratory: Positive for cough.   Cardiovascular: Negative.   Gastrointestinal: Positive for abdominal pain, constipation, nausea and vomiting.  Musculoskeletal: Negative.   Skin: Negative.   Neurological: Negative.   Psychiatric/Behavioral: Negative.   All other systems  reviewed and are negative.    Physical Exam Updated Vital Signs BP 94/55   Pulse 82   Temp 98.4 F (36.9 C)   Resp 18   Ht 5\' 3"  (1.6 m)   Wt 115 lb (52.2 kg)   SpO2 94%   BMI 20.37 kg/m   Physical Exam  Constitutional: She is oriented to person, place, and time. No distress.  Chronically ill-appearing  HENT:  Head: Normocephalic and atraumatic.  Mucous membranes dry  Eyes: Conjunctivae are normal. Pupils are equal, round, and reactive to light.  Neck: Neck supple. No tracheal deviation present. No thyromegaly present.  Cardiovascular: Normal rate and regular rhythm.   No murmur heard. Pulmonary/Chest: Effort normal and breath sounds normal.  Abdominal: Soft. Bowel sounds are normal. She exhibits no distension and no mass. There is tenderness. There is no rebound and no guarding. No hernia.  Minimally tender at mid abdomen  Musculoskeletal: Normal range of motion. She exhibits no edema or tenderness.  Neurological: She is alert and oriented to person, place, and time. Coordination normal.  Skin: Skin is warm and dry. No rash noted.  Psychiatric: She has a normal mood and affect.  Nursing note and vitals reviewed.    ED Treatments / Results  Labs (all labs ordered are listed, but only abnormal results are displayed) Labs Reviewed  CBC - Abnormal; Notable for the following:       Result Value   WBC 11.5 (*)    RBC 3.78 (*)    HCT 35.5 (*)    All other components within normal limits  LIPASE, BLOOD  COMPREHENSIVE METABOLIC PANEL  URINALYSIS, ROUTINE W REFLEX MICROSCOPIC (NOT AT [facility])    EKG  EKG Interpretation None     X-rays viewed by me Results for orders placed or performed during the hospital encounter of 08/23/16  Lipase, blood  Result Value Ref Range   Lipase 37 11 - 51 U/L  Comprehensive metabolic panel  Result Value Ref Range   Sodium 138 135 - 145 mmol/L   Potassium 3.4 (L) 3.5 - 5.1 mmol/L   Chloride 106 101 - 111 mmol/L   CO2 26 22 - 32  mmol/L   Glucose, Bld 97 65 - 99 mg/dL   BUN 9 6 - 20 mg/dL   Creatinine, Ser 0.91 0.44 - 1.00 mg/dL   Calcium 8.5 (L) 8.9 - 10.3 mg/dL   Total Protein 6.7 6.5 - 8.1 g/dL   Albumin 3.9 3.5 - 5.0 g/dL   AST 14 (L) 15 - 41 U/L   ALT 15 14 - 54 U/L   Alkaline Phosphatase 91 38 - 126 U/L   Total Bilirubin 0.3 0.3 - 1.2 mg/dL   GFR calc non Af Amer >60 >60 mL/min   GFR calc Af Amer >60 >  60 mL/min   Anion gap 6 5 - 15  CBC  Result Value Ref Range   WBC 11.5 (H) 4.0 - 10.5 K/uL   RBC 3.78 (L) 3.87 - 5.11 MIL/uL   Hemoglobin 12.0 12.0 - 15.0 g/dL   HCT 35.5 (L) 36.0 - 46.0 %   MCV 93.9 78.0 - 100.0 fL   MCH 31.7 26.0 - 34.0 pg   MCHC 33.8 30.0 - 36.0 g/dL   RDW 13.7 11.5 - 15.5 %   Platelets 233 150 - 400 K/uL  Urinalysis, Routine w reflex microscopic  Result Value Ref Range   Color, Urine YELLOW YELLOW   APPearance CLEAR CLEAR   Specific Gravity, Urine <1.005 (L) 1.005 - 1.030   pH 5.5 5.0 - 8.0   Glucose, UA NEGATIVE NEGATIVE mg/dL   Hgb urine dipstick NEGATIVE NEGATIVE   Bilirubin Urine NEGATIVE NEGATIVE   Ketones, ur NEGATIVE NEGATIVE mg/dL   Protein, ur NEGATIVE NEGATIVE mg/dL   Nitrite NEGATIVE NEGATIVE   Leukocytes, UA NEGATIVE NEGATIVE   Ct Abdomen Pelvis Wo Contrast  Result Date: 08/21/2016 CLINICAL DATA:  Initial evaluation for acute nausea and vomiting with abdominal pain. Last bowel movement 1 week ago. EXAM: CT ABDOMEN AND PELVIS WITHOUT CONTRAST TECHNIQUE: Multidetector CT imaging of the abdomen and pelvis was performed following the standard protocol without IV contrast. COMPARISON:  Prior CT at 12/17. FINDINGS: Lower chest: Hazy subsegmental atelectasis and/or scarring present within the low visualized lung bases. 3 mm nodule within the right lower lobe noted (series 6, image 3). This is stable from recent CT, but new from 2011. Hepatobiliary: Limited noncontrast evaluation of the liver is unremarkable. Gallbladder surgically absent. Intra and extrahepatic biliary  dilatation is similar, likely chronic and related to post cholecystectomy changes. Pancreas: Pancreas within normal limits. Spleen: Spleen within normal limits. Adrenals/Urinary Tract: 3.0 x 2.1 cm left adrenal lesion, stable from prior, and most compatible with a probable adenoma. Right adrenal gland unremarkable. Kidneys equal in size without evidence of nephrolithiasis or hydronephrosis. No stones seen along the course of either ureter. Urinary bladder within normal limits. Stomach/Bowel: Stomach within normal limits. No evidence for bowel obstruction. No acute inflammatory changes about the small bowel. Moderate amount of retained stool within the ascending, transverse, and proximal descending colon, suggesting constipation. Distally, there is apparent circumferential wall thickening with hazy stranding about the sigmoid colon, suggestive of possible acute colitis (series 2, image 51). Vascular/Lymphatic: Prominent aorto bi-iliac atherosclerotic calcifications. No aneurysm. No appreciable adenopathy. Reproductive: Uterus is absent.  Ovaries not discretely identified. Other: No free air or fluid. Musculoskeletal: No acute osseous abnormality. S1 no worrisome lytic or blastic osseous lesions. IMPRESSION: 1. Circumferential wall thickening with hazy fat stranding about the sigmoid colon, suggestive of acute colitis. 2. Moderate amount of retained stool within the colon proximally, suggesting constipation. 3. 3 cm left adrenal lesion, stable from recent CT. Again, given the relative size difference from 2011, further evaluation with adrenal mass protocol CT for complete evaluation is suggested. 4. Aortic atherosclerosis. 5. 3 mm right lower lobe pulmonary nodule, indeterminate. This is stable from recent CT from 08/09/2016, but new relative to 2011. No follow-up needed if patient is low-risk. Non-contrast chest CT can be considered in 12 months if patient is high-risk. This recommendation follows the consensus  statement: Guidelines for Management of Incidental Pulmonary Nodules Detected on CT Images: From the Fleischner Society 2017; Radiology 2017; 284:228-243. Electronically Signed   By: Jeannine Boga M.D.   On: 08/21/2016 15:44  Dg Chest 2 View  Result Date: 08/21/2016 CLINICAL DATA:  65 y/o  F; nausea and vomiting with abdominal pain EXAM: CHEST  2 VIEW COMPARISON:  01/19/2016 chest radiograph FINDINGS: Stable cardiac silhouette within normal limits. Stable scarring at the lung bases. No focal consolidation. Lungs are hyperinflated with flattened diaphragms consistent with COPD. No pleural effusion or pneumothorax. Multilevel degenerative changes of the thoracic spine. Right upper quadrant surgical clips, presumably cholecystectomy IMPRESSION: No active cardiopulmonary disease. Electronically Signed   By: Kristine Garbe M.D.   On: 08/21/2016 13:33   Ct Abdomen Pelvis W Contrast  Result Date: 08/09/2016 CLINICAL DATA:  Right lower quadrant pain with nausea, vomiting and diarrhea beginning today. EXAM: CT ABDOMEN AND PELVIS WITH CONTRAST TECHNIQUE: Multidetector CT imaging of the abdomen and pelvis was performed using the standard protocol following bolus administration of intravenous contrast. CONTRAST:  166mL ISOVUE-300 IOPAMIDOL (ISOVUE-300) INJECTION 61% COMPARISON:  04/20/2010 FINDINGS: Lower chest: Linear scarring and/or atelectasis at the lung bases. No pleural or pericardial fluid. Coronary artery calcification. Hepatobiliary: Previous cholecystectomy. Intra and extra hepatic ductal dilatation typical of that. No calcified ductal stone. Pancreas: Normal Spleen: Normal Adrenals/Urinary Tract: Left adrenal mass has enlarged, now measuring 3.2 x 2.2 cm. Contrast seems to washout fairly aggressively on the delayed images. This probably represents a benign adenoma, but the enlargement is concerning. Formal dynamic CT study with washout calculations recommended. Right adrenal gland is  normal. Both kidneys are normal except for a 7 mm cyst in the left kidney. Stomach/Bowel: The appendix is normal. No other bowel pathology is seen. Vascular/Lymphatic: Aortic atherosclerosis. No aneurysm. The IVC is normal. No retroperitoneal mass or lymphadenopathy. Reproductive: Previous hysterectomy.  No pelvic mass PE Other: No free fluid or air. Musculoskeletal: Chronic lumbar spine degenerative changes. IMPRESSION: No abnormality seen to explain acute symptoms. Previous hysterectomy. Chronic intra and extra hepatic biliary ductal dilatation. **An incidental finding of potential clinical significance has been found. 3.2 x 2.2 x 3.1cm left adrenal mass, enlarged from 1.5 cm in 2011. I think this probably represents an adrenal adenoma, but the enlargement is concerning. Formal CT study with washout calculation is suggested.** Aortic atherosclerosis. Normal appearing appendix. Electronically Signed   By: Nelson Chimes M.D.   On: 08/09/2016 19:59   Dg Abd Acute W/chest  Result Date: 08/23/2016 CLINICAL DATA:  Chest and abdominal pain. Nausea and diarrhea. COPD EXAM: DG ABDOMEN ACUTE W/ 1V CHEST COMPARISON:  CT abdomen pelvis 08/21/2016 FINDINGS: There is hyperexpansion of the lungs with diffuse increased interstitial markings, compatible with COPD. No focal consolidation or pulmonary edema. No pneumothorax or pleural effusion. There is no free intraperitoneal air. There is moderate stool within the distal transverse colon. Cholecystectomy clips are noted. No dilated loops of small bowel are seen. IMPRESSION: 1. No free intraperitoneal air or evidence of small-bowel obstruction. 2. COPD. Electronically Signed   By: Ulyses Jarred M.D.   On: 08/23/2016 21:55    Radiology No results found.  Procedures Procedures (including critical care time)  Medications Ordered in ED Medications  sodium chloride 0.9 % bolus 2,000 mL (not administered)  ipratropium-albuterol (DUONEB) 0.5-2.5 (3) MG/3ML nebulizer  solution 3 mL (3 mLs Nebulization Given 08/23/16 2110)     Initial Impression / Assessment and Plan / ED Course  I have reviewed the triage vital signs and the nursing notes.  Pertinent labs & imaging results that were available during my care of the patient were reviewed by me and considered in my medical decision making (see chart  for details).  Clinical Course    Breathing feels much improved after treatment with DuoNeb. She also feels much improved after treatment with intravenous hydration. She drank a Sprite without difficulty. She is not lightheaded on standing. plAn prescriptions Cipro Flagyl. She received potassium oral supplementation while here. I counseled patient for 5 minutes on smoking cessation Final Clinical Impressions(s) / ED Diagnoses  Diagnoses #1 colitis #2 bronchitis #3 hypokalemia #4 tobacco abuse Final diagnoses:  None    New Prescriptions New Prescriptions   No medications on file     Orlie Dakin, MD 08/23/16 2309

## 2016-08-23 NOTE — ED Triage Notes (Signed)
Pt c/o abd pain with n/v/d and was seen earlier in the week for the same. Pt states she is feeling worse than before.

## 2016-10-11 ENCOUNTER — Emergency Department (HOSPITAL_COMMUNITY)
Admission: EM | Admit: 2016-10-11 | Discharge: 2016-10-11 | Disposition: A | Discharge status: Home / Self Care | Payer: Medicare HMO | Attending: Emergency Medicine | Admitting: Emergency Medicine

## 2016-10-11 ENCOUNTER — Emergency Department (HOSPITAL_COMMUNITY): Payer: Medicare HMO

## 2016-10-11 ENCOUNTER — Encounter (HOSPITAL_COMMUNITY): Payer: Self-pay

## 2016-10-11 DIAGNOSIS — M542 Cervicalgia: Secondary | ICD-10-CM | POA: Diagnosis not present

## 2016-10-11 DIAGNOSIS — F17210 Nicotine dependence, cigarettes, uncomplicated: Secondary | ICD-10-CM | POA: Insufficient documentation

## 2016-10-11 DIAGNOSIS — M25511 Pain in right shoulder: Secondary | ICD-10-CM | POA: Diagnosis present

## 2016-10-11 DIAGNOSIS — Z79899 Other long term (current) drug therapy: Secondary | ICD-10-CM | POA: Diagnosis not present

## 2016-10-11 DIAGNOSIS — J449 Chronic obstructive pulmonary disease, unspecified: Secondary | ICD-10-CM | POA: Insufficient documentation

## 2016-10-11 MED ORDER — NAPROXEN 500 MG PO TABS: 500.0000 mg | ORAL_TABLET | Freq: Two times a day (BID) | ORAL | 0 refills | Status: DC

## 2016-10-11 MED ORDER — TRAMADOL HCL 50 MG PO TABS: 50.0000 mg | ORAL_TABLET | Freq: Four times a day (QID) | ORAL | 0 refills | Status: DC | PRN

## 2016-10-11 MED ORDER — HYDROCODONE-ACETAMINOPHEN 5-325 MG PO TABS
1.0000 | ORAL_TABLET | Freq: Once | ORAL | Status: AC
  Administered 2016-10-11: 1 via ORAL
  Filled 2016-10-11: qty 1

## 2016-10-11 NOTE — Discharge Instructions (Signed)
Apply heat to your shoulder on/off.  Call the orthopedic doctor listed to arrange a follow-up appt in a few days if the pain is not improving

## 2016-10-11 NOTE — ED Triage Notes (Signed)
Pt reports r arm pain x 3 weeks.  Pt says is sore to touch around r shoulder and neck.  Also pain worse with movement.

## 2016-10-13 NOTE — ED Provider Notes (Signed)
[facility] DEPT Provider Note   CSN: [id number] Arrival date & time: 10/11/16  1129     History   Chief Complaint Chief Complaint  Patient presents with  . Arm Pain    HPI [name] is a 65 y.o. female.  HPI  [name] is a 65 y.o. female who presents to the Emergency Department complaining of right shoulder and neck pain for 3 weeks.  She describes the pain as worsening and associated with movement of her neck or right arm.  She has tried topical muscle rubs without relief.  She denies fever, chills, numbness or weakness of the extremity, headaches or rash.  No known injury.   Past Medical History:  Diagnosis Date  . Alcohol abuse, in remission   . Anxiety   . Bipolar disorder ([place])   . COPD (chronic obstructive pulmonary disease) ([place])    occasional home O2  . Gastric ulcer   . Hemorrhoids   . IBS (irritable bowel syndrome)   . Seizures ([place])    last seizure was about a year ago    Patient Active Problem List   Diagnosis Date Noted  . Colitis 08/21/2016  . Adrenal mass, left ([place]) 08/21/2016  . Solitary pulmonary nodule 08/21/2016  . Tobacco use disorder 08/21/2016  . DYSPEPSIA 06/13/2010  . IRRITABLE BOWEL SYNDROME 06/13/2010  . HEMATOCHEZIA 06/13/2010  . WEIGHT LOSS, ABNORMAL 06/13/2010  . ABDOMINAL PAIN, GENERALIZED 06/13/2010    Past Surgical History:  Procedure Laterality Date  . ABDOMINAL HYSTERECTOMY    . CHOLECYSTECTOMY    . EYE SURGERY      OB History    Gravida Para Term Preterm AB Living   4 4 4     3    SAB TAB Ectopic Multiple Live Births                   Home Medications    Prior to Admission medications   Medication Sig Start Date End Date Taking? Authorizing Provider  albuterol (PROVENTIL HFA;VENTOLIN HFA) 108 (90 BASE) MCG/ACT inhaler Inhale 2 puffs into the lungs every 6 (six) hours as needed for wheezing or shortness of breath.    Yes Historical Provider, MD  albuterol (PROVENTIL) (2.5 MG/3ML) 0.083%  nebulizer solution Take 2.5 mg by nebulization every 6 (six) hours as needed for wheezing or shortness of breath.    Yes Historical Provider, MD  clonazePAM (KLONOPIN) 1 MG tablet Take 1 mg by mouth 3 (three) times daily.    Yes Historical Provider, MD  dicyclomine (BENTYL) 10 MG capsule Take 10 mg by mouth 2 (two) times daily.   Yes Historical Provider, MD  diphenhydramine-acetaminophen (TYLENOL PM) 25-500 MG TABS tablet Take 1 tablet by mouth at bedtime as needed.   Yes Historical Provider, MD  phenytoin (DILANTIN) 100 MG ER capsule Take 400 mg by mouth at bedtime.    Yes Historical Provider, MD  traZODone (DESYREL) 50 MG tablet Take 50-150 mg by mouth at bedtime as needed for sleep.    Yes Historical Provider, MD  ciprofloxacin (CIPRO) 500 MG tablet Take 1 tablet (500 mg total) by mouth 2 (two) times daily. One po bid x 7 days 08/23/16   Orlie Dakin, MD  metroNIDAZOLE (FLAGYL) 500 MG tablet Take 1 tablet (500 mg total) by mouth 3 (three) times daily. One po bid x 7 days 08/23/16   Orlie Dakin, MD  naproxen (NAPROSYN) 500 MG tablet Take 1 tablet (500 mg total) by mouth 2 (two) times  daily with a meal. 10/11/16   Allahna Husband, PA-C  omeprazole (PRILOSEC) 20 MG capsule Take 1 capsule by mouth daily. 10/04/16   Historical Provider, MD  ondansetron (ZOFRAN ODT) 4 MG disintegrating tablet Take 1 tablet (4 mg total) by mouth every 8 (eight) hours as needed for nausea or vomiting. 08/09/16   Sherwood Gambler, MD  traMADol (ULTRAM) 50 MG tablet Take 1 tablet (50 mg total) by mouth every 6 (six) hours as needed. 10/11/16   Anan Dapolito, PA-C    Family History Family History  Problem Relation Age of Onset  . Dementia Father     Social History Social History  Substance Use Topics  . Smoking status: Current Every Day Smoker    Packs/day: 0.25    Years: 32.00    Types: Cigarettes  . Smokeless tobacco: Never Used  . Alcohol use No     Comment: h/o heavy use, quit in 1995     Allergies     Flu virus vaccine; Lithium; and Varenicline tartrate   Review of Systems Review of Systems  Constitutional: Negative for chills and fever.  Eyes: Negative for visual disturbance.  Respiratory: Negative for shortness of breath.   Cardiovascular: Negative for chest pain.  Gastrointestinal: Negative for nausea and vomiting.  Genitourinary: Negative for difficulty urinating and dysuria.  Musculoskeletal: Positive for arthralgias (right shoulder pain), myalgias and neck pain. Negative for joint swelling.  Skin: Negative for color change and wound.  Neurological: Negative for dizziness, weakness, numbness and headaches.  All other systems reviewed and are negative.    Physical Exam Updated Vital Signs BP 144/79   Pulse 95   Temp 99.4 F (37.4 C) (Temporal)   Resp 20   Ht 5\' 3"  (1.6 m)   Wt 52.2 kg   SpO2 98%   BMI 20.37 kg/m   Physical Exam  Constitutional: She is oriented to person, place, and time. She appears well-developed and well-nourished. No distress.  HENT:  Head: Normocephalic and atraumatic.  Neck: Normal range of motion. Neck supple. No thyromegaly present.  Cardiovascular: Normal rate, regular rhythm and intact distal pulses.   No murmur heard. Pulmonary/Chest: Effort normal and breath sounds normal. No respiratory distress. She exhibits no tenderness.  Musculoskeletal: She exhibits tenderness. She exhibits no edema.  ttp of the anterior and posterior right shoulder.  Pain with abduction of the right arm and rotation of the shoulder.  Radial pulse is brisk, distal sensation intact, CR< 2 sec. Grip strength is strong and symmetrical.   No rash, edema , erythema or step-off deformity of the joint. Mild ttp of the right cervical paraspinal muscles including trapezius and rhomboids  Lymphadenopathy:    She has no cervical adenopathy.  Neurological: She is alert and oriented to person, place, and time. She has normal strength. No sensory deficit. She exhibits normal  muscle tone. Coordination normal.  Reflex Scores:      Tricep reflexes are 1+ on the right side and 2+ on the left side.      Bicep reflexes are 1+ on the right side and 2+ on the left side. Skin: Skin is warm and dry.  Nursing note and vitals reviewed.    ED Treatments / Results  Labs (all labs ordered are listed, but only abnormal results are displayed) Labs Reviewed - No data to display  EKG  EKG Interpretation None       Radiology Dg Cervical Spine Complete  Result Date: 10/11/2016 CLINICAL DATA:  Right neck and shoulder  pain for 2 weeks EXAM: CERVICAL SPINE - COMPLETE 4+ VIEW COMPARISON:  None. FINDINGS: Degenerative disc disease changes with disc space narrowing and mild spurring anteriorly throughout the cervical spine. Normal alignment. Mild degenerative facet disease bilaterally. No definite neural foraminal narrowing. The neural foramina in the lower cervical spine not well visualized bilaterally. No fracture or subluxation. Prevertebral soft tissues are normal. IMPRESSION: Mild degenerative changes.  No acute findings. Electronically Signed   By: Rolm Baptise M.D.   On: 10/11/2016 12:37   Dg Shoulder Right  Result Date: 10/11/2016 CLINICAL DATA:  Right neck and shoulder pain for 2 weeks. No known injury. EXAM: RIGHT SHOULDER - 2+ VIEW COMPARISON:  None. FINDINGS: Mild degenerative changes in the right [facility] joint with joint space narrowing and early spurring. No acute bony abnormality. Specifically, no fracture, subluxation, or dislocation. Soft tissues are intact. IMPRESSION: No acute bony abnormality. Electronically Signed   By: Rolm Baptise M.D.   On: 10/11/2016 12:38     Procedures Procedures (including critical care time)  Medications Ordered in ED Medications  HYDROcodone-acetaminophen (NORCO/VICODIN) 5-325 MG per tablet 1 tablet (1 tablet Oral Given 10/11/16 1234)     Initial Impression / Assessment and Plan / ED Course  I have reviewed the triage vital  signs and the nursing notes.  Pertinent labs & imaging results that were available during my care of the patient were reviewed by me and considered in my medical decision making (see chart for details).  Clinical Course     Pt with pain through ROM of the shoulder and palpation of the adjacent muscles.  No focal neuro deficits.  No motor weakness.  Likely musculoskeletal.  Pt agrees to symptomatic tx, advised to f/u with PMD  Final Clinical Impressions(s) / ED Diagnoses   Final diagnoses:  Acute pain of right shoulder    New Prescriptions Discharge Medication List as of 10/11/2016 12:50 PM    START taking these medications   Details  naproxen (NAPROSYN) 500 MG tablet Take 1 tablet (500 mg total) by mouth 2 (two) times daily with a meal., Starting Thu 10/11/2016, Print    traMADol (ULTRAM) 50 MG tablet Take 1 tablet (50 mg total) by mouth every 6 (six) hours as needed., Starting Thu 10/11/2016, Print         Kem Parkinson, PA-C 10/13/16 [place], MD 10/16/16 [phone]

## 2017-05-21 ENCOUNTER — Emergency Department (HOSPITAL_COMMUNITY): Payer: Medicare HMO

## 2017-05-21 ENCOUNTER — Encounter (HOSPITAL_COMMUNITY): Payer: Self-pay

## 2017-05-21 ENCOUNTER — Emergency Department (HOSPITAL_COMMUNITY)
Admission: EM | Admit: 2017-05-21 | Discharge: 2017-05-22 | Disposition: A | Discharge status: Home / Self Care | Payer: Medicare HMO | Attending: Emergency Medicine | Admitting: Emergency Medicine

## 2017-05-21 DIAGNOSIS — R112 Nausea with vomiting, unspecified: Secondary | ICD-10-CM | POA: Insufficient documentation

## 2017-05-21 DIAGNOSIS — R1084 Generalized abdominal pain: Secondary | ICD-10-CM | POA: Diagnosis not present

## 2017-05-21 DIAGNOSIS — F17210 Nicotine dependence, cigarettes, uncomplicated: Secondary | ICD-10-CM | POA: Diagnosis not present

## 2017-05-21 DIAGNOSIS — R197 Diarrhea, unspecified: Secondary | ICD-10-CM

## 2017-05-21 DIAGNOSIS — Z79899 Other long term (current) drug therapy: Secondary | ICD-10-CM | POA: Diagnosis not present

## 2017-05-21 DIAGNOSIS — J449 Chronic obstructive pulmonary disease, unspecified: Secondary | ICD-10-CM | POA: Insufficient documentation

## 2017-05-21 DIAGNOSIS — R109 Unspecified abdominal pain: Secondary | ICD-10-CM

## 2017-05-21 HISTORY — DX: Noninfective gastroenteritis and colitis, unspecified: K52.9

## 2017-05-21 LAB — CBC WITH DIFFERENTIAL/PLATELET
Basophils Absolute: 0 10*3/uL (ref 0.0–0.1)
Basophils Relative: 1 %
Eosinophils Absolute: 0 10*3/uL (ref 0.0–0.7)
Eosinophils Relative: 0 %
HEMATOCRIT: 39.5 % (ref 36.0–46.0)
HEMOGLOBIN: 13.5 g/dL (ref 12.0–15.0)
Lymphocytes Relative: 12 %
Lymphs Abs: 1 10*3/uL (ref 0.7–4.0)
MCH: 31.3 pg (ref 26.0–34.0)
MCHC: 34.2 g/dL (ref 30.0–36.0)
MCV: 91.6 fL (ref 78.0–100.0)
MONO ABS: 0.5 10*3/uL (ref 0.1–1.0)
Monocytes Relative: 6 %
Neutro Abs: 6.6 10*3/uL (ref 1.7–7.7)
Neutrophils Relative %: 81 %
Platelets: 245 10*3/uL (ref 150–400)
RBC: 4.31 MIL/uL (ref 3.87–5.11)
RDW: 12.9 % (ref 11.5–15.5)
WBC: 8.1 10*3/uL (ref 4.0–10.5)

## 2017-05-21 LAB — COMPREHENSIVE METABOLIC PANEL
ALT: 12 U/L — AB (ref 14–54)
AST: 14 U/L — AB (ref 15–41)
Albumin: 3.7 g/dL (ref 3.5–5.0)
Alkaline Phosphatase: 100 U/L (ref 38–126)
Anion gap: 7 (ref 5–15)
BILIRUBIN TOTAL: 0.5 mg/dL (ref 0.3–1.2)
BUN: 10 mg/dL (ref 6–20)
CO2: 25 mmol/L (ref 22–32)
CREATININE: 0.7 mg/dL (ref 0.44–1.00)
Calcium: 8.5 mg/dL — ABNORMAL LOW (ref 8.9–10.3)
Chloride: 106 mmol/L (ref 101–111)
GFR calc Af Amer: >60 mL/min (ref >60)
Glucose, Bld: 152 mg/dL — ABNORMAL HIGH (ref 65–99)
Potassium: 3.5 mmol/L (ref 3.5–5.1)
Sodium: 138 mmol/L (ref 135–145)
TOTAL PROTEIN: 7 g/dL (ref 6.5–8.1)

## 2017-05-21 LAB — URINALYSIS, ROUTINE W REFLEX MICROSCOPIC
BACTERIA UA: NONE SEEN
BILIRUBIN URINE: NEGATIVE
Glucose, UA: 50 mg/dL — AB
Ketones, ur: NEGATIVE mg/dL
Leukocytes, UA: NEGATIVE
Nitrite: NEGATIVE
PROTEIN: NEGATIVE mg/dL
Specific Gravity, Urine: 1.013 (ref 1.005–1.030)
Squamous Epithelial / LPF: NONE SEEN
pH: 8 (ref 5.0–8.0)

## 2017-05-21 LAB — LIPASE, BLOOD: LIPASE: 53 U/L — AB (ref 11–51)

## 2017-05-21 MED ORDER — ONDANSETRON 4 MG PO TBDP: 4.0000 mg | ORAL_TABLET | Freq: Three times a day (TID) | ORAL | 0 refills | Status: DC | PRN

## 2017-05-21 MED ORDER — FAMOTIDINE IN NACL 20-0.9 MG/50ML-% IV SOLN
20.0000 mg | Freq: Once | INTRAVENOUS | Status: AC
  Administered 2017-05-21: 20 mg via INTRAVENOUS
  Filled 2017-05-21: qty 50

## 2017-05-21 MED ORDER — SODIUM CHLORIDE 0.9 % IV SOLN
INTRAVENOUS | Status: DC
  Administered 2017-05-21 (×2): via INTRAVENOUS

## 2017-05-21 MED ORDER — SODIUM CHLORIDE 0.9 % IV BOLUS (SEPSIS)
500.0000 mL | Freq: Once | INTRAVENOUS | Status: AC
  Administered 2017-05-21: 500 mL via INTRAVENOUS

## 2017-05-21 MED ORDER — MORPHINE SULFATE (PF) 4 MG/ML IV SOLN
4.0000 mg | INTRAVENOUS | Status: DC | PRN
  Administered 2017-05-21: 4 mg via INTRAVENOUS
  Filled 2017-05-21: qty 1

## 2017-05-21 MED ORDER — IOPAMIDOL (ISOVUE-300) INJECTION 61%
INTRAVENOUS | Status: AC
  Administered 2017-05-21: 30 mL
  Filled 2017-05-21: qty 30

## 2017-05-21 MED ORDER — ONDANSETRON HCL 4 MG/2ML IJ SOLN
4.0000 mg | INTRAMUSCULAR | Status: DC | PRN
  Administered 2017-05-21: 4 mg via INTRAVENOUS
  Filled 2017-05-21: qty 2

## 2017-05-21 MED ORDER — IOPAMIDOL (ISOVUE-300) INJECTION 61%
100.0000 mL | Freq: Once | INTRAVENOUS | Status: AC | PRN
  Administered 2017-05-21: 100 mL via INTRAVENOUS

## 2017-05-21 NOTE — ED Triage Notes (Signed)
Pt c/o abd pain, headache, n/v/d since yesterday evening.   Reports husband has the same symptoms.  EMS started IV, administered 4mg  of zofran and 436ml of NS.

## 2017-05-21 NOTE — ED Notes (Signed)
Pt given water to drink, per PO challenge. Pt states she feels fine drinking it. No n/v

## 2017-05-21 NOTE — ED Provider Notes (Signed)
[facility] DEPT Provider Note   CSN: [id number] Arrival date & time: 05/21/17  1841     History   Chief Complaint Chief Complaint  Patient presents with  . Abdominal Pain  . Emesis    HPI [name] is a 66 y.o. female.   Abdominal Pain   Associated symptoms include vomiting.  Emesis   Associated symptoms include abdominal pain.  Pt was seen at [place].  Per pt, c/o gradual onset and persistence of constant generalized abd "pain" since yesterday.  Has been associated with multiple intermittent episodes of N/V/D.  Describes the abd pain as "cramping." States her husband has similar symptoms.  EMS gave IVF and zofran en route.  Denies fevers, no back pain, no rash, no CP/SOB, no black or blood in stools or emesis.      Past Medical History:  Diagnosis Date  . Alcohol abuse, in remission   . Anxiety   . Bipolar disorder ([place])   . Colitis   . COPD (chronic obstructive pulmonary disease) ([place])    occasional home O2  . Gastric ulcer   . Hemorrhoids   . IBS (irritable bowel syndrome)   . Seizures ([place])    last seizure was about a year ago    Patient Active Problem List   Diagnosis Date Noted  . Colitis 08/21/2016  . Adrenal mass, left ([place]) 08/21/2016  . Solitary pulmonary nodule 08/21/2016  . Tobacco use disorder 08/21/2016  . DYSPEPSIA 06/13/2010  . IRRITABLE BOWEL SYNDROME 06/13/2010  . HEMATOCHEZIA 06/13/2010  . WEIGHT LOSS, ABNORMAL 06/13/2010  . ABDOMINAL PAIN, GENERALIZED 06/13/2010    Past Surgical History:  Procedure Laterality Date  . ABDOMINAL HYSTERECTOMY    . CHOLECYSTECTOMY    . EYE SURGERY      OB History    Gravida Para Term Preterm AB Living   4 4 4     3    SAB TAB Ectopic Multiple Live Births                   Home Medications    Prior to Admission medications   Medication Sig Start Date End Date Taking? Authorizing Provider  albuterol (PROVENTIL HFA;VENTOLIN HFA) 108 (90 BASE) MCG/ACT inhaler Inhale 2 puffs into the lungs  every 6 (six) hours as needed for wheezing or shortness of breath.     [provider]  albuterol (PROVENTIL) (2.5 MG/3ML) 0.083% nebulizer solution Take 2.5 mg by nebulization every 6 (six) hours as needed for wheezing or shortness of breath.     [provider]  ciprofloxacin (CIPRO) 500 MG tablet Take 1 tablet (500 mg total) by mouth 2 (two) times daily. One po bid x 7 days 08/23/16   Orlie Dakin, MD  clonazePAM (KLONOPIN) 1 MG tablet Take 1 mg by mouth 3 (three) times daily.     [provider]  dicyclomine (BENTYL) 10 MG capsule Take 10 mg by mouth 2 (two) times daily.    [provider]  diphenhydramine-acetaminophen (TYLENOL PM) 25-500 MG TABS tablet Take 1 tablet by mouth at bedtime as needed.    [provider]  metroNIDAZOLE (FLAGYL) 500 MG tablet Take 1 tablet (500 mg total) by mouth 3 (three) times daily. One po bid x 7 days 08/23/16   Orlie Dakin, MD  naproxen (NAPROSYN) 500 MG tablet Take 1 tablet (500 mg total) by mouth 2 (two) times daily with a meal. 10/11/16   Triplett, Tammy, PA-C  omeprazole (PRILOSEC) 20 MG capsule Take  1 capsule by mouth daily. 10/04/16   [provider]  ondansetron (ZOFRAN ODT) 4 MG disintegrating tablet Take 1 tablet (4 mg total) by mouth every 8 (eight) hours as needed for nausea or vomiting. 08/09/16   Sherwood Gambler, MD  phenytoin (DILANTIN) 100 MG ER capsule Take 400 mg by mouth at bedtime.     [provider]  traMADol (ULTRAM) 50 MG tablet Take 1 tablet (50 mg total) by mouth every 6 (six) hours as needed. 10/11/16   Triplett, Tammy, PA-C  traZODone (DESYREL) 50 MG tablet Take 50-150 mg by mouth at bedtime as needed for sleep.     [provider]    Family History Family History  Problem Relation Age of Onset  . Dementia Father     Social History Social History  Substance Use Topics  . Smoking status: Current Every Day Smoker    Packs/day: 0.25    Years: 32.00     Types: Cigarettes  . Smokeless tobacco: Never Used  . Alcohol use No     Comment: h/o heavy use, quit in 1995     Allergies   Flu virus vaccine; Lithium; and Varenicline tartrate   Review of Systems Review of Systems  Gastrointestinal: Positive for abdominal pain and vomiting.  ROS: Statement: All systems negative except as marked or noted in the HPI; Constitutional: Negative for fever and chills. ; ; Eyes: Negative for eye pain, redness and discharge. ; ; ENMT: Negative for ear pain, hoarseness, nasal congestion, sinus pressure and sore throat. ; ; Cardiovascular: Negative for chest pain, palpitations, diaphoresis, dyspnea and peripheral edema. ; ; Respiratory: Negative for cough, wheezing and stridor. ; ; Gastrointestinal: +N/V/D, abd pain. Negative for blood in stool, hematemesis, jaundice and rectal bleeding. . ; ; Genitourinary: Negative for dysuria, flank pain and hematuria. ; ; Musculoskeletal: Negative for back pain and neck pain. Negative for swelling and trauma.; ; Skin: Negative for pruritus, rash, abrasions, blisters, bruising and skin lesion.; ; Neuro: Negative for headache, lightheadedness and neck stiffness. Negative for weakness, altered level of consciousness, altered mental status, extremity weakness, paresthesias, involuntary movement, seizure and syncope.      Physical Exam Updated Vital Signs BP (!) 173/87 (BP Location: Left Arm)   Pulse 76   Temp 98.1 F (36.7 C) (Oral)   Resp 20   Ht 5\' 3"  (1.6 m)   Wt 54.4 kg (120 lb)   SpO2 98%   BMI 21.26 kg/m    Patient Vitals for the past 24 hrs:  BP Temp Temp src Pulse Resp SpO2 Height Weight  05/21/17 2201 (!) 180/99 - - 84 17 98 % - -  05/21/17 1840 (!) 173/87 98.1 F (36.7 C) Oral 76 20 98 % - -  05/21/17 1839 - - - - - - 5\' 3"  (1.6 m) 54.4 kg (120 lb)      Physical Exam 1845: Physical examination:  Nursing notes reviewed; Vital signs and O2 SAT reviewed;  Constitutional: Well developed, Well nourished,  Uncomfortable appearing.; Head:  Normocephalic, atraumatic; Eyes: EOMI, PERRL, No scleral icterus; ENMT: Mouth and pharynx normal, Mucous membranes dry; Neck: Supple, Full range of motion, No lymphadenopathy; Cardiovascular: Regular rate and rhythm, No gallop; Respiratory: Breath sounds clear & equal bilaterally, No wheezes.  Speaking full sentences with ease, Normal respiratory effort/excursion; Chest: Nontender, Movement normal; Abdomen: Soft, +diffuse tenderness to palp. No rebound. Nondistended, Normal bowel sounds. +vomiting clear emesis during my exam.; Genitourinary: No CVA tenderness; Extremities: Pulses normal, No tenderness,  No edema, No calf edema or asymmetry.; Neuro: AA&Ox3, Major CN grossly intact.  Speech clear. No gross focal motor or sensory deficits in extremities.; Skin: Color normal, Warm, Dry.   ED Treatments / Results  Labs (all labs ordered are listed, but only abnormal results are displayed)   EKG  EKG Interpretation None       Radiology   Procedures Procedures (including critical care time)  Medications Ordered in ED Medications  sodium chloride 0.9 % bolus 500 mL (not administered)  0.9 %  sodium chloride infusion (not administered)  ondansetron (ZOFRAN) injection 4 mg (not administered)  morphine 4 MG/ML injection 4 mg (not administered)     Initial Impression / Assessment and Plan / ED Course  I have reviewed the triage vital signs and the nursing notes.  Pertinent labs & imaging results that were available during my care of the patient were reviewed by me and considered in my medical decision making (see chart for details).  MDM Reviewed: previous chart, vitals and nursing note Reviewed previous: labs Interpretation: labs, x-ray and CT scan   Results for orders placed or performed during the hospital encounter of 05/21/17  Comprehensive metabolic panel  Result Value Ref Range   Sodium 138 135 - 145 mmol/L   Potassium 3.5 3.5 - 5.1 mmol/L    Chloride 106 101 - 111 mmol/L   CO2 25 22 - 32 mmol/L   Glucose, Bld 152 (H) 65 - 99 mg/dL   BUN 10 6 - 20 mg/dL   Creatinine, Ser 0.70 0.44 - 1.00 mg/dL   Calcium 8.5 (L) 8.9 - 10.3 mg/dL   Total Protein 7.0 6.5 - 8.1 g/dL   Albumin 3.7 3.5 - 5.0 g/dL   AST 14 (L) 15 - 41 U/L   ALT 12 (L) 14 - 54 U/L   Alkaline Phosphatase 100 38 - 126 U/L   Total Bilirubin 0.5 0.3 - 1.2 mg/dL   GFR calc non Af Amer >60 >60 mL/min   GFR calc Af Amer >60 >60 mL/min   Anion gap 7 5 - 15  Lipase, blood  Result Value Ref Range   Lipase 53 (H) 11 - 51 U/L  CBC with Differential  Result Value Ref Range   WBC 8.1 4.0 - 10.5 K/uL   RBC 4.31 3.87 - 5.11 MIL/uL   Hemoglobin 13.5 12.0 - 15.0 g/dL   HCT 39.5 36.0 - 46.0 %   MCV 91.6 78.0 - 100.0 fL   MCH 31.3 26.0 - 34.0 pg   MCHC 34.2 30.0 - 36.0 g/dL   RDW 12.9 11.5 - 15.5 %   Platelets 245 150 - 400 K/uL   Neutrophils Relative % 81 %   Neutro Abs 6.6 1.7 - 7.7 K/uL   Lymphocytes Relative 12 %   Lymphs Abs 1.0 0.7 - 4.0 K/uL   Monocytes Relative 6 %   Monocytes Absolute 0.5 0.1 - 1.0 K/uL   Eosinophils Relative 0 %   Eosinophils Absolute 0.0 0.0 - 0.7 K/uL   Basophils Relative 1 %   Basophils Absolute 0.0 0.0 - 0.1 K/uL  Urinalysis, Routine w reflex microscopic  Result Value Ref Range   Color, Urine STRAW (A) YELLOW   APPearance CLEAR CLEAR   Specific Gravity, Urine 1.013 1.005 - 1.030   pH 8.0 5.0 - 8.0   Glucose, UA 50 (A) NEGATIVE mg/dL   Hgb urine dipstick SMALL (A) NEGATIVE   Bilirubin Urine NEGATIVE NEGATIVE   Ketones, ur NEGATIVE NEGATIVE mg/dL  Protein, ur NEGATIVE NEGATIVE mg/dL   Nitrite NEGATIVE NEGATIVE   Leukocytes, UA NEGATIVE NEGATIVE   RBC / HPF 6-30 0 - 5 RBC/hpf   WBC, UA 0-5 0 - 5 WBC/hpf   Bacteria, UA NONE SEEN NONE SEEN   Squamous Epithelial / LPF NONE SEEN NONE SEEN   Dg Chest 2 View Result Date: 05/21/2017 CLINICAL DATA:  Nausea and vomiting EXAM: CHEST  2 VIEW COMPARISON:  08/23/2016 FINDINGS: Cardiac shadow  is within normal limits. The lungs are hyperinflated with chronic interstitial changes. Minimal small right pleural effusion is noted. No acute bony abnormality is noted. IMPRESSION: Minimal posterior right pleural effusion. No other focal abnormality is noted. Electronically Signed   By: Inez Catalina M.D.   On: 05/21/2017 20:48   Ct Abdomen Pelvis W Contrast Result Date: 05/21/2017 CLINICAL DATA:  Pt c/o abd pain, headache, n/v/d since yesterday evening. Reports husband has the same symptoms^15mL ISOVUE-300 IOPAMIDOL (ISOVUE-300) INJECTION 61%, 141mL ISOVUE-300 IOPAMIDOL (ISOVUE-300) INJECTION 61% Cholecystectomy, hysterectomy. EXAM: CT ABDOMEN AND PELVIS WITH CONTRAST TECHNIQUE: Multidetector CT imaging of the abdomen and pelvis was performed using the standard protocol following bolus administration of intravenous contrast. CONTRAST:  38mL ISOVUE-300 IOPAMIDOL (ISOVUE-300) INJECTION 61%, 167mL ISOVUE-300 IOPAMIDOL (ISOVUE-300) INJECTION 61% COMPARISON:  08/23/2016, 08/21/2016, 04/20/2010 FINDINGS: Lower chest: There is minimal bibasilar atelectasis versus scarring. Heart size is normal. Hepatobiliary: Small calcified granulomata are identified within the liver. There is moderate intrahepatic biliary duct dilatation. Status post cholecystectomy. The common bile duct is 1.5 cm. (1.3 cm in 2011) There has been chronic dilatation of the intra and extrahepatic bile ducts. There is a small locule of gas in the region of the ampulla of daughter, raising the question of previous sphincterotomy versus small duodenal diverticulum. Pancreas: Unremarkable. No pancreatic ductal dilatation or surrounding inflammatory changes. Spleen: Normal in size without focal abnormality. Adrenals/Urinary Tract: Left adrenal mass is 3.6 x 2.1 cm. This demonstrates washout consistent with benign adrenal adenoma. Right adrenal gland is normal. Normal appearance of the kidneys and ureters. Stomach/Bowel: The stomach and small bowel loops  are normal in appearance. Colonic loops appear normal. Vascular/Lymphatic: There is dense atherosclerotic calcification of the abdominal aorta. No aneurysm. There is normal vascular opacification of the celiac axis, superior mesenteric artery, and inferior mesenteric artery. Normal appearance of the portal venous system and inferior vena cava. Reproductive: Status post hysterectomy.  No adnexal mass. Other: There is marked distension of the urinary bladder extending to the umbilicus. Musculoskeletal: Mild degenerative changes are identified in the lumbar spine. There is 4 mm anterolisthesis of L4 on L5. No acute fracture or traumatic subluxation. IMPRESSION: 1. Mildly progressive intra and extrahepatic biliary duct dilatation, of probably within normal limits for the postoperative state. However, correlation is recommended regarding symptoms or signs of obstruction. 2. Left adrenal mass shows slow enlargement over multiple years but typical washout features of adenoma. 3.  Aortic atherosclerosis. 4. Status post hysterectomy. 5. Marked distension of the urinary bladder raising the question of functional or mechanical bladder outlet obstruction. 6. Grade 1 anterolisthesis L4 on L5. Electronically Signed   By: Nolon Nations M.D.   On: 05/21/2017 22:15    2345:  Pt I&O cath for Udip after CT scan. Bladder scan with 592ml; I&O cath with 778ml. Pt has tol PO well while in the ED without N/V.  No stooling while in the ED.  Abd benign, VSS. Pt states she feels better and wants to go home now. Tx symptomatically at this time. Dx and  testing d/w pt.  Questions answered.  Verb understanding, agreeable to d/c home with outpt f/u.       Final Clinical Impressions(s) / ED Diagnoses   Final diagnoses:  None    New Prescriptions New Prescriptions   No medications on file      Francine Graven, DO 05/24/17 1514

## 2017-05-21 NOTE — Discharge Instructions (Signed)
Take the prescription as directed.  Increase your fluid intake (ie:  Gatoraide) for the next few days, as discussed.  Eat a bland diet and advance to your regular diet slowly as you can tolerate it.   Avoid full strength juices, as well as milk and milk products until your diarrhea has resolved.   Call your regular medical doctor tomorrow to schedule a follow up appointment in the next 2 days.  Return to the Emergency Department immediately if not improving (or even worsening) despite taking the medicines as prescribed, any black or bloody stool or vomit, if you develop a fever over "101," or for any other concerns.

## 2017-05-23 LAB — URINE CULTURE: Culture: NO GROWTH

## 2017-05-30 ENCOUNTER — Ambulatory Visit (INDEPENDENT_AMBULATORY_CARE_PROVIDER_SITE_OTHER): Payer: Medicare HMO | Admitting: Orthopaedic Surgery

## 2017-05-30 ENCOUNTER — Encounter: Payer: Self-pay | Admitting: Orthopaedic Surgery

## 2017-05-30 ENCOUNTER — Ambulatory Visit (INDEPENDENT_AMBULATORY_CARE_PROVIDER_SITE_OTHER): Payer: Medicare HMO

## 2017-05-30 VITALS — BP 123/64 | HR 71 | Temp 98.3°F | Ht 64.0 in | Wt 118.0 lb

## 2017-05-30 DIAGNOSIS — M25511 Pain in right shoulder: Secondary | ICD-10-CM | POA: Diagnosis not present

## 2017-05-30 DIAGNOSIS — J449 Chronic obstructive pulmonary disease, unspecified: Secondary | ICD-10-CM | POA: Insufficient documentation

## 2017-05-30 DIAGNOSIS — F17210 Nicotine dependence, cigarettes, uncomplicated: Secondary | ICD-10-CM | POA: Diagnosis not present

## 2017-05-30 DIAGNOSIS — G8929 Other chronic pain: Secondary | ICD-10-CM

## 2017-05-30 NOTE — Progress Notes (Signed)
Subjective:    Patient ID: [name], female    DOB: [birth year], 66 y.o.   MRN: [id number]  HPI She has had pain in the right shoulder since Thanksgiving last year.  She was seen in the ER on 10-14-16 for this.  X-rays were negative.  She has pain with overhead use.  She has pain with reaching behind her. She has no trauma, no redness, no swelling, no numbness.  She has tried ice, heat, rubs, Tylenol, Advil with little help.  She has seen Dr. Karie Kirks and more recently Dr. Gerarda Fraction for this.  She still hurts.  She has no other joint pains.  She smokes and is not willing to stop.  Review of Systems  HENT: Negative for congestion.   Respiratory: Positive for shortness of breath. Negative for cough.   Cardiovascular: Negative for chest pain and leg swelling.  Endocrine: Positive for cold intolerance.  Musculoskeletal: Positive for arthralgias and myalgias.  Allergic/Immunologic: Positive for environmental allergies.  Neurological: Positive for seizures.  Psychiatric/Behavioral: The patient is nervous/anxious.    Past Medical History:  Diagnosis Date  . Alcohol abuse, in remission   . Anxiety   . Bipolar disorder ([place])   . Colitis   . COPD (chronic obstructive pulmonary disease) ([place])    occasional home O2  . Gastric ulcer   . Hemorrhoids   . IBS (irritable bowel syndrome)   . Seizures ([place])    last seizure was about a year ago    Past Surgical History:  Procedure Laterality Date  . ABDOMINAL HYSTERECTOMY    . CHOLECYSTECTOMY    . EYE SURGERY      Current Outpatient Prescriptions on File Prior to Visit  Medication Sig Dispense Refill  . albuterol (PROVENTIL HFA;VENTOLIN HFA) 108 (90 BASE) MCG/ACT inhaler Inhale 2 puffs into the lungs every 6 (six) hours as needed for wheezing or shortness of breath.     Marland Kitchen albuterol (PROVENTIL) (2.5 MG/3ML) 0.083% nebulizer solution Take 2.5 mg by nebulization every 6 (six) hours as needed for wheezing or shortness of breath.     .  clonazePAM (KLONOPIN) 1 MG tablet Take 1 mg by mouth See admin instructions. Starting on 05/11/2017 per pharmacy records, take one tablet twice daily for 2 days, then take one tablet daily for 7 days, then take one tablet every other day for 7 days until gone    . dicyclomine (BENTYL) 10 MG capsule Take 10 mg by mouth 2 (two) times daily.    . hydrOXYzine (ATARAX/VISTARIL) 10 MG tablet Take 10 mg by mouth every 6 (six) hours as needed.    Marland Kitchen omeprazole (PRILOSEC) 20 MG capsule Take 1 capsule by mouth daily.    . ondansetron (ZOFRAN ODT) 4 MG disintegrating tablet Take 1 tablet (4 mg total) by mouth every 8 (eight) hours as needed for nausea or vomiting. 6 tablet 0  . phenytoin (DILANTIN) 100 MG ER capsule Take 400 mg by mouth at bedtime.     Marland Kitchen QUEtiapine (SEROQUEL) 25 MG tablet Take 25 mg by mouth 3 (three) times daily.    . traZODone (DESYREL) 50 MG tablet Take 150 mg by mouth at bedtime.      No current facility-administered medications on file prior to visit.     Social History   Social History  . Marital status: Married    Spouse name: N/A  . Number of children: N/A  . Years of education: N/A   Occupational History  . retired  Social History Main Topics  . Smoking status: Current Every Day Smoker    Packs/day: 0.25    Years: 32.00    Types: Cigarettes  . Smokeless tobacco: Never Used  . Alcohol use No     Comment: h/o heavy use, quit in 1995  . Drug use: Yes    Frequency: 2.0 times per week    Types: Marijuana  . Sexual activity: Not on file   Other Topics Concern  . Not on file   Social History Narrative  . No narrative on file    Family History  Problem Relation Age of Onset  . Dementia Father     BP 123/64   Pulse 71   Temp 98.3 F (36.8 C)   Ht 5\' 4"  (1.626 m)   Wt 118 lb (53.5 kg)   BMI 20.25 kg/m      Objective:   Physical Exam  Constitutional: She is oriented to person, place, and time. She appears well-developed and well-nourished.  HENT:    Head: Normocephalic and atraumatic.  Eyes: Pupils are equal, round, and reactive to light. Conjunctivae and EOM are normal.  Neck: Normal range of motion. Neck supple.  Cardiovascular: Normal rate, regular rhythm and intact distal pulses.   Pulmonary/Chest: Effort normal.  Abdominal: Soft.  Musculoskeletal: She exhibits tenderness (Pain right shoulder, forward 90, abduction 60, extension 5, internal 20, external 30, adduction 40, NV intact.  No redness or swelling of right shoulder.  Left shoulder and neck negative.).  Neurological: She is alert and oriented to person, place, and time. She displays normal reflexes. No cranial nerve deficit. She exhibits normal muscle tone. Coordination normal.  Skin: Skin is warm and dry.  Psychiatric: She has a normal mood and affect. Her behavior is normal. Judgment and thought content normal.  Vitals reviewed.   X-rays were done of the right shoulder, reported separately.      Assessment & Plan:   Encounter Diagnoses  Name Primary?  . Pain in joint of right shoulder Yes  . Cigarette nicotine dependence without complication    PROCEDURE NOTE:  The patient request injection, verbal consent was obtained.  The right shoulder was prepped appropriately after time out was performed.   Sterile technique was observed and injection of 1 cc of Depo-Medrol 40 mg with several cc's of plain xylocaine. Anesthesia was provided by ethyl chloride and a 20-gauge needle was used to inject the shoulder area. A posterior approach was used.  The injection was tolerated well.  A band aid dressing was applied.  The patient was advised to apply ice later today and tomorrow to the injection sight as needed.  I would like to get a MRI of the right shoulder.  She has been hurting for over ten months with no help.  Return after MRI.  Call if any problem.  Precautions discussed.   Electronically Signed Sanjuana Kava, MD 8/2/20189:16 AM

## 2017-05-30 NOTE — Patient Instructions (Signed)
Steps to Quit Smoking Smoking tobacco can be bad for your health. It can also affect almost every organ in your body. Smoking puts you and people around you at risk for many serious long-lasting (chronic) diseases. Quitting smoking is hard, but it is one of the best things that you can do for your health. It is never too late to quit. What are the benefits of quitting smoking? When you quit smoking, you lower your risk for getting serious diseases and conditions. They can include:  Lung cancer or lung disease.  Heart disease.  Stroke.  Heart attack.  Not being able to have children (infertility).  Weak bones (osteoporosis) and broken bones (fractures).  If you have coughing, wheezing, and shortness of breath, those symptoms may get better when you quit. You may also get sick less often. If you are pregnant, quitting smoking can help to lower your chances of having a baby of low birth weight. What can I do to help me quit smoking? Talk with your doctor about what can help you quit smoking. Some things you can do (strategies) include:  Quitting smoking totally, instead of slowly cutting back how much you smoke over a period of time.  Going to in-person counseling. You are more likely to quit if you go to many counseling sessions.  Using resources and support systems, such as: ? Online chats with a counselor. ? Phone quitlines. ? Printed self-help materials. ? Support groups or group counseling. ? Text messaging programs. ? Mobile phone apps or applications.  Taking medicines. Some of these medicines may have nicotine in them. If you are pregnant or breastfeeding, do not take any medicines to quit smoking unless your doctor says it is okay. Talk with your doctor about counseling or other things that can help you.  Talk with your doctor about using more than one strategy at the same time, such as taking medicines while you are also going to in-person counseling. This can help make  quitting easier. What things can I do to make it easier to quit? Quitting smoking might feel very hard at first, but there is a lot that you can do to make it easier. Take these steps:  Talk to your family and friends. Ask them to support and encourage you.  Call phone quitlines, reach out to support groups, or work with a counselor.  Ask people who smoke to not smoke around you.  Avoid places that make you want (trigger) to smoke, such as: ? Bars. ? Parties. ? Smoke-break areas at work.  Spend time with people who do not smoke.  Lower the stress in your life. Stress can make you want to smoke. Try these things to help your stress: ? Getting regular exercise. ? Deep-breathing exercises. ? Yoga. ? Meditating. ? Doing a body scan. To do this, close your eyes, focus on one area of your body at a time from head to toe, and notice which parts of your body are tense. Try to relax the muscles in those areas.  Download or buy apps on your mobile phone or tablet that can help you stick to your quit plan. There are many free apps, such as QuitGuide from the CDC (Centers for Disease Control and Prevention). You can find more support from smokefree.gov and other websites.  This information is not intended to replace advice given to you by your health care provider. Make sure you discuss any questions you have with your health care provider. Document Released: 08/11/2009 Document   Revised: 06/12/2016 Document Reviewed: 03/01/2015 Elsevier Interactive Patient Education  2018 [employer]  

## 2017-07-08 DIAGNOSIS — Z6820 Body mass index (BMI) 20.0-20.9, adult: Secondary | ICD-10-CM | POA: Diagnosis not present

## 2017-07-08 DIAGNOSIS — M25511 Pain in right shoulder: Secondary | ICD-10-CM | POA: Diagnosis not present

## 2017-07-08 DIAGNOSIS — Z79899 Other long term (current) drug therapy: Secondary | ICD-10-CM | POA: Diagnosis not present

## 2017-07-08 DIAGNOSIS — K219 Gastro-esophageal reflux disease without esophagitis: Secondary | ICD-10-CM | POA: Diagnosis not present

## 2017-07-11 ENCOUNTER — Telehealth: Payer: Self-pay | Admitting: Orthopaedic Surgery

## 2017-07-16 ENCOUNTER — Encounter: Payer: Self-pay | Admitting: Orthopaedic Surgery

## 2017-07-16 ENCOUNTER — Ambulatory Visit (INDEPENDENT_AMBULATORY_CARE_PROVIDER_SITE_OTHER): Payer: Medicare Other | Admitting: Orthopaedic Surgery

## 2017-07-16 VITALS — BP 129/77 | HR 85 | Temp 99.5°F | Ht 64.0 in | Wt 117.0 lb

## 2017-07-16 DIAGNOSIS — Z91199 Patient's noncompliance with other medical treatment and regimen due to unspecified reason: Secondary | ICD-10-CM

## 2017-07-16 DIAGNOSIS — Z9119 Patient's noncompliance with other medical treatment and regimen: Secondary | ICD-10-CM

## 2017-07-16 DIAGNOSIS — M25511 Pain in right shoulder: Secondary | ICD-10-CM | POA: Diagnosis not present

## 2017-07-16 DIAGNOSIS — F17210 Nicotine dependence, cigarettes, uncomplicated: Secondary | ICD-10-CM | POA: Diagnosis not present

## 2017-07-16 DIAGNOSIS — G8929 Other chronic pain: Secondary | ICD-10-CM | POA: Diagnosis not present

## 2017-07-16 NOTE — Progress Notes (Signed)
Patient [name], female DOB:1951-07-20, 66 y.o. BJS:[id number]  Chief Complaint  Patient presents with  . Shoulder Pain    right    HPI  [name] is a 66 y.o. female who has continued pain of the right shoulder.  She is no better.  Her pain is worse she says.  She has no new trauma.  We had gotten approval for the MRI from her insurance earlier last month.  We called her but did not get answer.  We sent her a letter and got no response.  She says she did not get a call and never got a letter.  I have told her to double check her address we have and the number we have.  We will try to get approval once again.  She asks for pain medicine and said no, not until after the MRI.    HPI  Body mass index is 20.08 kg/m.  ROS  Review of Systems  HENT: Negative for congestion.   Respiratory: Positive for shortness of breath. Negative for cough.   Cardiovascular: Negative for chest pain and leg swelling.  Endocrine: Positive for cold intolerance.  Musculoskeletal: Positive for arthralgias and myalgias.  Allergic/Immunologic: Positive for environmental allergies.  Neurological: Positive for seizures.  Psychiatric/Behavioral: The patient is nervous/anxious.     Past Medical History:  Diagnosis Date  . Alcohol abuse, in remission   . Anxiety   . Bipolar disorder ([place])   . Colitis   . COPD (chronic obstructive pulmonary disease) ([place])    occasional home O2  . Gastric ulcer   . Hemorrhoids   . IBS (irritable bowel syndrome)   . Seizures ([place])    last seizure was about a year ago    Past Surgical History:  Procedure Laterality Date  . ABDOMINAL HYSTERECTOMY    . CHOLECYSTECTOMY    . EYE SURGERY      Family History  Problem Relation Age of Onset  . Dementia Father     Social History Social History  Substance Use Topics  . Smoking status: Current Every Day Smoker    Packs/day: 0.25    Years: 32.00    Types: Cigarettes  . Smokeless tobacco: Never Used  .  Alcohol use No     Comment: h/o heavy use, quit in 1995    Allergies  Allergen Reactions  . Chantix [Varenicline]   . Codeine Nausea And Vomiting  . Flu Virus Vaccine Nausea And Vomiting  . Lithium Nausea And Vomiting  . Varenicline Tartrate Hives    Current Outpatient Prescriptions  Medication Sig Dispense Refill  . albuterol (PROVENTIL HFA;VENTOLIN HFA) 108 (90 BASE) MCG/ACT inhaler Inhale 2 puffs into the lungs every 6 (six) hours as needed for wheezing or shortness of breath.     Marland Kitchen albuterol (PROVENTIL) (2.5 MG/3ML) 0.083% nebulizer solution Take 2.5 mg by nebulization every 6 (six) hours as needed for wheezing or shortness of breath.     . clonazePAM (KLONOPIN) 1 MG tablet Take 1 mg by mouth See admin instructions. Starting on 05/11/2017 per pharmacy records, take one tablet twice daily for 2 days, then take one tablet daily for 7 days, then take one tablet every other day for 7 days until gone    . dicyclomine (BENTYL) 10 MG capsule Take 10 mg by mouth 2 (two) times daily.    . hydrOXYzine (ATARAX/VISTARIL) 10 MG tablet Take 10 mg by mouth every 6 (six) hours as needed.    Marland Kitchen omeprazole (PRILOSEC)  20 MG capsule Take 1 capsule by mouth daily.    . ondansetron (ZOFRAN ODT) 4 MG disintegrating tablet Take 1 tablet (4 mg total) by mouth every 8 (eight) hours as needed for nausea or vomiting. 6 tablet 0  . phenytoin (DILANTIN) 100 MG ER capsule Take 400 mg by mouth at bedtime.     Marland Kitchen QUEtiapine (SEROQUEL) 25 MG tablet Take 25 mg by mouth 3 (three) times daily.    . traZODone (DESYREL) 50 MG tablet Take 150 mg by mouth at bedtime.      No current facility-administered medications for this visit.      Physical Exam  Blood pressure 129/77, pulse 85, temperature 99.5 F (37.5 C), height 5\' 4"  (1.626 m), weight 117 lb (53.1 kg).  Constitutional: overall normal hygiene, normal nutrition, well developed, normal grooming, normal body habitus. Assistive device:none  Musculoskeletal: gait  and station Limp none, muscle tone and strength are normal, no tremors or atrophy is present.  .  Neurological: coordination overall normal.  Deep tendon reflex/nerve stretch intact.  Sensation normal.  Cranial nerves II-XII intact.   Skin:   Normal overall no scars, lesions, ulcers or rashes. No psoriasis.  Psychiatric: Alert and oriented x 3.  Recent memory intact, remote memory unclear.  Normal mood and affect. Well groomed.  Good eye contact.  Cardiovascular: overall no swelling, no varicosities, no edema bilaterally, normal temperatures of the legs and arms, no clubbing, cyanosis and good capillary refill.  Lymphatic: palpation is normal.  All other systems reviewed and are negative   Examination of right Upper Extremity is done.  Inspection:   Overall:  Elbow non-tender without crepitus or defects, forearm non-tender without crepitus or defects, wrist non-tender without crepitus or defects, hand non-tender.    Shoulder: with glenohumeral joint tenderness, without effusion.   Upper arm: without swelling and tenderness   Range of motion:   Overall:  Full range of motion of the elbow, full range of motion of wrist and full range of motion in fingers.   Shoulder:  right  140 degrees forward flexion; 120 degrees abduction; 30 degrees internal rotation, 30 degrees external rotation, 15 degrees extension, 40 degrees adduction.   Stability:   Overall:  Shoulder, elbow and wrist stable   Strength and Tone:   Overall full shoulder muscles strength, full upper arm strength and normal upper arm bulk and tone.  The patient has been educated about the nature of the problem(s) and counseled on treatment options.  The patient appeared to understand what I have discussed and is in agreement with it.  Encounter Diagnoses  Name Primary?  . Pain in joint of right shoulder Yes  . Cigarette nicotine dependence without complication   . Non compliance with medical treatment     PLAN Call if any  problems.  Precautions discussed.  Continue current medications.   Return to clinic after MRI of the right shoulder   Electronically Signed Sanjuana Kava, MD 9/18/20182:10 PM

## 2017-07-16 NOTE — Patient Instructions (Signed)
Pasos para dejar de fumar (Steps to Quit Smoking) Fumar tabaco es malo para su salud. Puede afectar a casi cualquier rgano del cuerpo. Fumar lo pone a usted y a Public affairs consultant a su alrededor en riesgo de Sports administrator enfermedades graves de [place] plazo (crnicas). Dejar de fumar es difcil, pero es una de las mejores cosas que puede hacer por su salud. Nunca es muy tarde para dejar de fumar. [place] AL [place]? Al dejar de fumar, se reduce el riesgo de contraer enfermedades y afecciones graves. Estas pueden incluir los siguientes:  Enfermedad o cncer de pulmn.  Cardiopata coronaria.  Ictus.  Infarto de miocardio.  Imposibilidad de tener hijos (infertilidad).  Huesos dbiles (osteoporosis) y huesos rotos (fracturas). La tos, las sibilancias y la falta de aire son sntomas que mejoran cuando deja de fumar. Es posible tambin que se enferme con Media planner. Si est embarazada, dejar de fumar la ayudar a reducir las probabilidades de Best boy un beb de bajo peso al nacer. QU PUEDO HACER PARA [place]? Pregntele al [employer] cosas que pueden ayudarlo a dejar el hbito. Algunos cosas que puede hacer (estrategias) incluyen:  Dejar de fumar de forma definitiva en lugar de ir reduciendo gradualmente la cantidad de cigarrillos durante un perodo.  Recibir asesoramiento psicolgico individual. Es ms probable que tenga xito si asiste a diversas sesiones de Merchant navy officer.  Usar recursos y sistemas de soporte, como, por ejemplo: ? Charlas en lnea con un consejero. ? Lneas telefnicas para dejar de fumar. ? Materiales impresos de [place]. ? Grupos de apoyo o asesoramiento psicolgico grupal. ? Programas de mensajes de texto. ? Aplicaciones para telfonos celulares.  Tomar medicamentos. Algunos de estos medicamentos pueden contener nicotina. Si est embarazada o amamantando, no tome ningn medicamento  para dejar de fumar, excepto que el mdico lo autorice. Hable con el mdico sobre el asesoramiento psicolgico o sobre otras cosas que [place]. Hable con el mdico sobre usar ms de una estrategia al [employer], [place], por ejemplo, tomar medicamentos y tambin recibir asesoramiento psicolgico. Esto puede facilitarle el proceso para dejar de fumar. QU PUEDO HACER PARA QUE DEJAR DE FUMAR SEA MS FCIL? Al principio, dejar de fumar puede parecer abrumador, pero hay muchas opciones que facilitan el [place]. Tome estas medidas:  Converse con su familia o sus amigos. Busque su apoyo y [place].  Llame a las lneas telefnicas que ayudan a dejar de fumar, pngase en contacto con grupos de apoyo o reciba asesoramiento de un consejero.  Pdale a la gente que fuma que no lo haga a su alrededor.  Evite los lugares que pueden despertar el deseo de fumar (disparadores), como, por ejemplo: ? Bares. ? Fiestas. ? reas para fumar en el trabajo.  Pase tiempo con personas que no fuman.  Disminuya todo tipo de estrs de la vida diaria. El estrs puede hacer que usted desee fumar. Pruebe estas cosas para disminuir el estrs: ? Practicar actividad fsica con regularidad. ? Practicar ejercicios de respiracin profunda. ? Practicar yoga. ? Medite. ? Realizar una visualizacin corporal. Para ello, cierre los ojos, concntrese en una zona del cuerpo a la vez desde la cabeza [employer] dedos de los pies y fjese qu partes del cuerpo estn tensas. Relaje los msculos de esas reas.  Descargue o compre aplicaciones para telfonos mviles o tabletas que le ayuden a respetar el plan para dejar de fumar. Hay muchas aplicaciones gratuitas, como QuitGuide de  los Centros para el Control y la Prevencin de Arboriculturist (CDC, Doctor, hospital for [employer] and Prevention). Puede hallar otros recursos de [employer].gov y en otros sitios web. Esta informacin no tiene Marine scientist el consejo del mdico.  Asegrese de hacerle al mdico cualquier pregunta que tenga. Document Released: 11/17/2010 Document Revised: 01/07/2012 Document Reviewed: 03/01/2015 Elsevier Interactive Patient Education  [employer].

## 2017-07-26 ENCOUNTER — Emergency Department (HOSPITAL_COMMUNITY)
Admission: EM | Admit: 2017-07-26 | Discharge: 2017-07-26 | Disposition: A | Discharge status: Home / Self Care | Payer: Medicare Other | Attending: Emergency Medicine | Admitting: Emergency Medicine

## 2017-07-26 ENCOUNTER — Encounter (HOSPITAL_COMMUNITY): Payer: Self-pay | Admitting: *Deleted

## 2017-07-26 DIAGNOSIS — D367 Benign neoplasm of other specified sites: Secondary | ICD-10-CM

## 2017-07-26 DIAGNOSIS — R2231 Localized swelling, mass and lump, right upper limb: Secondary | ICD-10-CM | POA: Diagnosis present

## 2017-07-26 DIAGNOSIS — F17210 Nicotine dependence, cigarettes, uncomplicated: Secondary | ICD-10-CM | POA: Diagnosis not present

## 2017-07-26 DIAGNOSIS — D2361 Other benign neoplasm of skin of right upper limb, including shoulder: Secondary | ICD-10-CM | POA: Diagnosis not present

## 2017-07-26 DIAGNOSIS — Z79899 Other long term (current) drug therapy: Secondary | ICD-10-CM | POA: Insufficient documentation

## 2017-07-26 DIAGNOSIS — J449 Chronic obstructive pulmonary disease, unspecified: Secondary | ICD-10-CM | POA: Diagnosis not present

## 2017-07-26 DIAGNOSIS — D2111 Benign neoplasm of connective and other soft tissue of right upper limb, including shoulder: Secondary | ICD-10-CM | POA: Diagnosis not present

## 2017-07-26 MED ORDER — LIDOCAINE-EPINEPHRINE (PF) 1 %-1:200000 IJ SOLN
10.0000 mL | Freq: Once | INTRAMUSCULAR | Status: DC
  Filled 2017-07-26: qty 30

## 2017-07-26 MED ORDER — BACITRACIN ZINC 500 UNIT/GM EX OINT: 1.0000 "application " | TOPICAL_OINTMENT | Freq: Two times a day (BID) | CUTANEOUS | 0 refills | Status: DC

## 2017-07-26 NOTE — ED Provider Notes (Signed)
[facility] DEPT Provider Note   CSN: [id number] Arrival date & time: 07/26/17  4098     History   Chief Complaint Chief Complaint  Patient presents with  . Arm Pain    HPI [name] is a 66 y.o. female.  HPI  67 year old female with a history of prior alcohol abuse but states that she no longer drinks, history of COPD as well as a history of bipolar disorder. She reports that she has had a mass on her right forearm which is been present for several months, it is not getting better, it is not red or inflamed and she has no associated fevers. She is concerned because this is not going away. She does not see her doctor for several months. It is tender to the touch.  Past Medical History:  Diagnosis Date  . Alcohol abuse, in remission   . Anxiety   . Bipolar disorder ([place])   . Colitis   . COPD (chronic obstructive pulmonary disease) ([place])    occasional home O2  . Gastric ulcer   . Hemorrhoids   . IBS (irritable bowel syndrome)   . Seizures ([place])    last seizure was about a year ago    Patient Active Problem List   Diagnosis Date Noted  . COPD (chronic obstructive pulmonary disease) ([place]) 05/30/2017  . Colitis 08/21/2016  . Adrenal mass, left ([place]) 08/21/2016  . Solitary pulmonary nodule 08/21/2016  . Tobacco use disorder 08/21/2016  . DYSPEPSIA 06/13/2010  . IRRITABLE BOWEL SYNDROME 06/13/2010  . HEMATOCHEZIA 06/13/2010  . WEIGHT LOSS, ABNORMAL 06/13/2010  . ABDOMINAL PAIN, GENERALIZED 06/13/2010    Past Surgical History:  Procedure Laterality Date  . ABDOMINAL HYSTERECTOMY    . CHOLECYSTECTOMY    . EYE SURGERY      OB History    Gravida Para Term Preterm AB Living   4 4 4     3    SAB TAB Ectopic Multiple Live Births                   Home Medications    Prior to Admission medications   Medication Sig Start Date End Date Taking? Authorizing Provider  albuterol (PROVENTIL HFA;VENTOLIN HFA) 108 (90 BASE) MCG/ACT inhaler Inhale 2 puffs into  the lungs every 6 (six) hours as needed for wheezing or shortness of breath.     [provider]  albuterol (PROVENTIL) (2.5 MG/3ML) 0.083% nebulizer solution Take 2.5 mg by nebulization every 6 (six) hours as needed for wheezing or shortness of breath.     [provider]  bacitracin ointment Apply 1 application topically 2 (two) times daily. 07/26/17   [name], MD  clonazePAM (KLONOPIN) 1 MG tablet Take 1 mg by mouth See admin instructions. Starting on 05/11/2017 per pharmacy records, take one tablet twice daily for 2 days, then take one tablet daily for 7 days, then take one tablet every other day for 7 days until gone    [provider]  dicyclomine (BENTYL) 10 MG capsule Take 10 mg by mouth 2 (two) times daily.    [provider]  hydrOXYzine (ATARAX/VISTARIL) 10 MG tablet Take 10 mg by mouth every 6 (six) hours as needed.    [provider]  omeprazole (PRILOSEC) 20 MG capsule Take 1 capsule by mouth daily. 10/04/16   [provider]  ondansetron (ZOFRAN ODT) 4 MG disintegrating tablet Take 1 tablet (4 mg total) by mouth every 8 (eight) hours as needed for nausea  or vomiting. 05/21/17   Francine Graven, DO  phenytoin (DILANTIN) 100 MG ER capsule Take 400 mg by mouth at bedtime.     [provider]  QUEtiapine (SEROQUEL) 25 MG tablet Take 25 mg by mouth 3 (three) times daily.    [provider]  traZODone (DESYREL) 50 MG tablet Take 150 mg by mouth at bedtime.     [provider]    Family History Family History  Problem Relation Age of Onset  . Dementia Father     Social History Social History  Substance Use Topics  . Smoking status: Current Every Day Smoker    Packs/day: 0.25    Years: 32.00    Types: Cigarettes  . Smokeless tobacco: Never Used  . Alcohol use No     Comment: h/o heavy use, quit in 1995     Allergies   Chantix [varenicline]; Codeine; Flu virus vaccine; Lithium; and Varenicline  tartrate   Review of Systems Review of Systems  Constitutional: Negative for fever.  Skin: Positive for rash.     Physical Exam Updated Vital Signs BP 116/65   Pulse 78   Temp 98.3 F (36.8 C) (Oral)   Resp 18   Ht 5\' 4"  (1.626 m)   Wt 53.1 kg (117 lb)   SpO2 94%   BMI 20.08 kg/m   Physical Exam  Constitutional: She appears well-developed and well-nourished. No distress.  HENT:  Head: Normocephalic and atraumatic.  Eyes: Conjunctivae are normal. Right eye exhibits no discharge. Left eye exhibits no discharge. No scleral icterus.  Cardiovascular: Normal rate and regular rhythm.   No murmur heard. Pulmonary/Chest: Effort normal and breath sounds normal.  Musculoskeletal: She exhibits tenderness. She exhibits no edema.  Skin: Skin is warm and dry. She is not diaphoretic.  Approximately 1 cm rounded raised nodular lump to the right forearm.  no surrounding redness, no induration, no fluctuance  Nursing note and vitals reviewed.    ED Treatments / Results  Labs (all labs ordered are listed, but only abnormal results are displayed) Labs Reviewed - No data to display   Radiology No results found.  Procedures .Marland KitchenIncision and Drainage Date/Time: 07/26/2017 10:15 AM Performed by: [name] Authorized by: [name]   Consent:    Consent obtained:  Verbal   Consent given by:  Patient   Risks discussed:  Bleeding, incomplete drainage and pain   Alternatives discussed:  No treatment, alternative treatment and observation Location:    Type:  Cyst   Size:  1.5 cm   Location:  Upper extremity   Upper extremity location:  Arm   Arm location:  R lower arm Pre-procedure details:    Skin preparation:  Betadine Anesthesia (see MAR for exact dosages):    Anesthesia method:  Local infiltration   Local anesthetic:  Lidocaine 1% WITH epi Procedure type:    Complexity:  Complex Procedure details:    Incision types:  Single straight   Incision depth:  Dermal    Scalpel blade:  10   Wound management:  Probed and deloculated, irrigated with saline and extensive cleaning   Drainage characteristics: purulent / waxy material.   Wound treatment:  Wound left open Post-procedure details:    Patient tolerance of procedure:  Tolerated well, no immediate complications Comments:     Removed all the waxy insides - this was covered with quick clot and bleeding stopped - then bacitracin and sterile dressing.   (including critical care time)  Medications Ordered in ED  Medications  lidocaine-EPINEPHrine (XYLOCAINE-EPINEPHrine) 1 %-1:200000 (PF) injection 10 mL (not administered)     Initial Impression / Assessment and Plan / ED Course  I have reviewed the triage vital signs and the nursing notes.  Pertinent labs & imaging results that were available during my care of the patient were reviewed by me and considered in my medical decision making (see chart for details).     Will need a needle aspiration to evaluate for abscess - otherwise, may need derm referral.  Had epidrmal cyst - pt tolerated removal / I and D.  Stable for d/c.  Final Clinical Impressions(s) / ED Diagnoses   Final diagnoses:  Dermoid cyst of arm, right    New Prescriptions New Prescriptions   BACITRACIN OINTMENT    Apply 1 application topically 2 (two) times daily.     [name], MD 07/26/17 1017

## 2017-07-26 NOTE — Discharge Instructions (Signed)
Your growth was a dermoid cyst - these can be on the skin for years I have opened yours up and removed the waxy material on the inside This may bleed and cause some pain Please keep a dressing on this wound for the next week Keep Bacitracin topical antibiotic on this wound as well - Change the dressing twice a day See your doctor for a wound check in 2-3 days. ER for unconrolled bleeding, pain or swelling or the arm, any fevers or spreading pain or redness

## 2017-07-26 NOTE — ED Triage Notes (Signed)
Pt as a knot on her right forearm that has been there for 2 months. Denies any drainage. States the area is painful.

## 2017-08-20 DIAGNOSIS — M75101 Unspecified rotator cuff tear or rupture of right shoulder, not specified as traumatic: Secondary | ICD-10-CM | POA: Diagnosis not present

## 2017-08-20 DIAGNOSIS — Z6820 Body mass index (BMI) 20.0-20.9, adult: Secondary | ICD-10-CM | POA: Diagnosis not present

## 2017-08-20 DIAGNOSIS — Z719 Counseling, unspecified: Secondary | ICD-10-CM | POA: Diagnosis not present

## 2017-08-20 DIAGNOSIS — E782 Mixed hyperlipidemia: Secondary | ICD-10-CM | POA: Diagnosis not present

## 2017-08-27 ENCOUNTER — Other Ambulatory Visit (HOSPITAL_COMMUNITY): Payer: Self-pay | Admitting: Internal Medicine

## 2017-08-27 DIAGNOSIS — Z1231 Encounter for screening mammogram for malignant neoplasm of breast: Secondary | ICD-10-CM

## 2017-09-04 ENCOUNTER — Encounter (HOSPITAL_COMMUNITY): Payer: Self-pay

## 2017-09-04 ENCOUNTER — Ambulatory Visit (HOSPITAL_COMMUNITY)
Admission: RE | Admit: 2017-09-04 | Discharge: 2017-09-04 | Disposition: A | Discharge status: Home / Self Care | Payer: Medicare Other | Source: Ambulatory Visit | Attending: Internal Medicine | Admitting: Internal Medicine

## 2017-09-04 DIAGNOSIS — R928 Other abnormal and inconclusive findings on diagnostic imaging of breast: Secondary | ICD-10-CM | POA: Insufficient documentation

## 2017-09-04 DIAGNOSIS — Z1231 Encounter for screening mammogram for malignant neoplasm of breast: Secondary | ICD-10-CM | POA: Insufficient documentation

## 2017-09-16 DIAGNOSIS — M25511 Pain in right shoulder: Secondary | ICD-10-CM | POA: Diagnosis not present

## 2017-09-16 DIAGNOSIS — M7581 Other shoulder lesions, right shoulder: Secondary | ICD-10-CM | POA: Diagnosis not present

## 2017-09-30 ENCOUNTER — Encounter (HOSPITAL_COMMUNITY): Payer: Self-pay

## 2017-10-09 ENCOUNTER — Other Ambulatory Visit (HOSPITAL_COMMUNITY): Payer: Self-pay | Admitting: Internal Medicine

## 2017-10-09 DIAGNOSIS — R928 Other abnormal and inconclusive findings on diagnostic imaging of breast: Secondary | ICD-10-CM

## 2017-10-24 ENCOUNTER — Encounter (HOSPITAL_COMMUNITY): Payer: Medicare Other

## 2017-11-05 ENCOUNTER — Encounter (HOSPITAL_COMMUNITY): Payer: Medicare Other

## 2017-11-05 ENCOUNTER — Encounter (HOSPITAL_COMMUNITY): Payer: Self-pay

## 2017-11-13 DIAGNOSIS — Z6821 Body mass index (BMI) 21.0-21.9, adult: Secondary | ICD-10-CM | POA: Diagnosis not present

## 2017-11-13 DIAGNOSIS — Z1389 Encounter for screening for other disorder: Secondary | ICD-10-CM | POA: Diagnosis not present

## 2017-11-13 DIAGNOSIS — M25519 Pain in unspecified shoulder: Secondary | ICD-10-CM | POA: Diagnosis not present

## 2017-11-13 DIAGNOSIS — K589 Irritable bowel syndrome without diarrhea: Secondary | ICD-10-CM | POA: Diagnosis not present

## 2017-11-15 DIAGNOSIS — J449 Chronic obstructive pulmonary disease, unspecified: Secondary | ICD-10-CM | POA: Diagnosis not present

## 2017-11-15 DIAGNOSIS — G4709 Other insomnia: Secondary | ICD-10-CM | POA: Diagnosis not present

## 2017-11-15 DIAGNOSIS — E279 Disorder of adrenal gland, unspecified: Secondary | ICD-10-CM | POA: Diagnosis not present

## 2017-12-18 ENCOUNTER — Other Ambulatory Visit (HOSPITAL_COMMUNITY): Payer: Self-pay | Admitting: Internal Medicine

## 2017-12-18 DIAGNOSIS — E2839 Other primary ovarian failure: Secondary | ICD-10-CM

## 2017-12-27 ENCOUNTER — Other Ambulatory Visit (HOSPITAL_COMMUNITY): Payer: Self-pay | Admitting: Internal Medicine

## 2017-12-27 DIAGNOSIS — E2839 Other primary ovarian failure: Secondary | ICD-10-CM

## 2017-12-27 DIAGNOSIS — M899 Disorder of bone, unspecified: Secondary | ICD-10-CM

## 2017-12-31 DIAGNOSIS — G44209 Tension-type headache, unspecified, not intractable: Secondary | ICD-10-CM | POA: Diagnosis not present

## 2017-12-31 DIAGNOSIS — Z6821 Body mass index (BMI) 21.0-21.9, adult: Secondary | ICD-10-CM | POA: Diagnosis not present

## 2017-12-31 DIAGNOSIS — E119 Type 2 diabetes mellitus without complications: Secondary | ICD-10-CM | POA: Diagnosis not present

## 2017-12-31 DIAGNOSIS — J449 Chronic obstructive pulmonary disease, unspecified: Secondary | ICD-10-CM | POA: Diagnosis not present

## 2018-01-07 ENCOUNTER — Inpatient Hospital Stay (HOSPITAL_COMMUNITY)
Admission: RE | Admit: 2018-01-07 | Discharge: 2018-01-07 | Disposition: A | Discharge status: Home / Self Care | Payer: Medicare Other | Source: Ambulatory Visit | Attending: Internal Medicine | Admitting: Internal Medicine

## 2018-01-07 ENCOUNTER — Encounter (HOSPITAL_COMMUNITY): Payer: Medicare Other

## 2018-01-07 ENCOUNTER — Encounter (HOSPITAL_COMMUNITY): Payer: Self-pay

## 2018-03-12 ENCOUNTER — Emergency Department (HOSPITAL_COMMUNITY): Payer: Medicare Other

## 2018-03-12 ENCOUNTER — Encounter (HOSPITAL_COMMUNITY): Payer: Self-pay | Admitting: Cardiology

## 2018-03-12 ENCOUNTER — Other Ambulatory Visit: Payer: Self-pay

## 2018-03-12 ENCOUNTER — Emergency Department (HOSPITAL_COMMUNITY)
Admission: EM | Admit: 2018-03-12 | Discharge: 2018-03-12 | Disposition: A | Discharge status: Home / Self Care | Payer: Medicare Other | Attending: Emergency Medicine | Admitting: Emergency Medicine

## 2018-03-12 DIAGNOSIS — R079 Chest pain, unspecified: Secondary | ICD-10-CM | POA: Diagnosis not present

## 2018-03-12 DIAGNOSIS — F17210 Nicotine dependence, cigarettes, uncomplicated: Secondary | ICD-10-CM | POA: Insufficient documentation

## 2018-03-12 DIAGNOSIS — Z79899 Other long term (current) drug therapy: Secondary | ICD-10-CM | POA: Diagnosis not present

## 2018-03-12 DIAGNOSIS — J441 Chronic obstructive pulmonary disease with (acute) exacerbation: Secondary | ICD-10-CM | POA: Diagnosis not present

## 2018-03-12 DIAGNOSIS — R531 Weakness: Secondary | ICD-10-CM | POA: Insufficient documentation

## 2018-03-12 LAB — CBC
HCT: 39.5 % (ref 36.0–46.0)
Hemoglobin: 13.5 g/dL (ref 12.0–15.0)
MCH: 31.4 pg (ref 26.0–34.0)
MCHC: 34.2 g/dL (ref 30.0–36.0)
MCV: 91.9 fL (ref 78.0–100.0)
PLATELETS: 254 10*3/uL (ref 150–400)
RBC: 4.3 MIL/uL (ref 3.87–5.11)
RDW: 13 % (ref 11.5–15.5)
WBC: 5.9 10*3/uL (ref 4.0–10.5)

## 2018-03-12 LAB — I-STAT TROPONIN, ED: TROPONIN I, POC: 0 ng/mL (ref 0.00–0.08)

## 2018-03-12 LAB — BASIC METABOLIC PANEL
Anion gap: 12 (ref 5–15)
BUN: 11 mg/dL (ref 6–20)
CALCIUM: 9 mg/dL (ref 8.9–10.3)
CO2: 23 mmol/L (ref 22–32)
Chloride: 100 mmol/L — ABNORMAL LOW (ref 101–111)
Creatinine, Ser: 0.95 mg/dL (ref 0.44–1.00)
GFR calc Af Amer: >60 mL/min (ref >60)
GLUCOSE: 109 mg/dL — AB (ref 65–99)
Potassium: 3.7 mmol/L (ref 3.5–5.1)
Sodium: 135 mmol/L (ref 135–145)

## 2018-03-12 LAB — TROPONIN I

## 2018-03-12 MED ORDER — PREDNISONE 20 MG PO TABS: 40.0000 mg | ORAL_TABLET | Freq: Every day | ORAL | 0 refills | Status: DC

## 2018-03-12 MED ORDER — PREDNISONE 50 MG PO TABS
60.0000 mg | ORAL_TABLET | ORAL | Status: AC
  Administered 2018-03-12: 60 mg via ORAL
  Filled 2018-03-12: qty 1

## 2018-03-12 NOTE — ED Triage Notes (Signed)
Chest pain and productive cough times 3 days.    Right arm pain since yesterday.

## 2018-03-12 NOTE — ED Notes (Signed)
Pt ambulatory to bathroom with no assistance. Steady gait noted

## 2018-03-12 NOTE — Discharge Instructions (Addendum)
As discussed, there is some suspicion for your COPD contributing to today's episode of weakness, chest pain. Please take the prescribed steroids as directed, and for the next 2 days for use your albuterol every 4 hours. Return here for concerning changes otherwise be sure to follow-up with your physician.

## 2018-03-12 NOTE — ED Provider Notes (Signed)
[facility] EMERGENCY DEPARTMENT Provider Note   CSN: [id number] Arrival date & time: 03/12/18  4166     History   Chief Complaint Chief Complaint  Patient presents with  . Chest Pain    HPI [name] is a 67 y.o. female.  HPI This patient presents with her husband who assists with the HPI. She notes that she has multiple medical issues including IBS, COPD, but had been generally well until about 1 week ago. Now, over the past week she has had right-sided chest pain, tingling in her right arm, and progressive weakness, with decreased capacity to perform previously normal activities of daily living, including walking around the house. Since onset no change in medication, diet She has not noticed a decreased capacity for smoking cigarettes.   Past Medical History:  Diagnosis Date  . Alcohol abuse, in remission   . Anxiety   . Bipolar disorder ([place])   . Colitis   . COPD (chronic obstructive pulmonary disease) ([place])    occasional home O2  . Gastric ulcer   . Hemorrhoids   . IBS (irritable bowel syndrome)   . Seizures ([place])    last seizure was about a year ago    Patient Active Problem List   Diagnosis Date Noted  . COPD (chronic obstructive pulmonary disease) ([place]) 05/30/2017  . Colitis 08/21/2016  . Adrenal mass, left ([place]) 08/21/2016  . Solitary pulmonary nodule 08/21/2016  . Tobacco use disorder 08/21/2016  . DYSPEPSIA 06/13/2010  . IRRITABLE BOWEL SYNDROME 06/13/2010  . HEMATOCHEZIA 06/13/2010  . WEIGHT LOSS, ABNORMAL 06/13/2010  . ABDOMINAL PAIN, GENERALIZED 06/13/2010    Past Surgical History:  Procedure Laterality Date  . ABDOMINAL HYSTERECTOMY    . CHOLECYSTECTOMY    . EYE SURGERY       OB History    Gravida  4   Para  4   Term  4   Preterm      AB      Living  3     SAB      TAB      Ectopic      Multiple      Live Births               Home Medications    Prior to Admission medications   Medication Sig Start  Date End Date Taking? Authorizing Provider  albuterol (PROVENTIL HFA;VENTOLIN HFA) 108 (90 BASE) MCG/ACT inhaler Inhale 2 puffs into the lungs every 6 (six) hours as needed for wheezing or shortness of breath.    Yes [provider]  albuterol (PROVENTIL) (2.5 MG/3ML) 0.083% nebulizer solution Take 2.5 mg by nebulization every 6 (six) hours as needed for wheezing or shortness of breath.    Yes [provider]  clonazePAM (KLONOPIN) 1 MG tablet Take 1 mg by mouth 3 (three) times daily.    Yes [provider]  dicyclomine (BENTYL) 10 MG capsule Take 10 mg by mouth 2 (two) times daily.   Yes [provider]  omeprazole (PRILOSEC) 20 MG capsule Take 1 capsule by mouth daily. 10/04/16  Yes [provider]  phenytoin (DILANTIN) 100 MG ER capsule Take 400 mg by mouth at bedtime.    Yes [provider]  traZODone (DESYREL) 50 MG tablet Take 150 mg by mouth at bedtime.    Yes [provider]  ondansetron (ZOFRAN ODT) 4 MG disintegrating tablet Take 1 tablet (4 mg total) by mouth every 8 (eight) hours as needed for  nausea or vomiting. 05/21/17   Francine Graven, DO  QUEtiapine (SEROQUEL) 25 MG tablet Take 25 mg by mouth 3 (three) times daily.    [provider]    Family History Family History  Problem Relation Age of Onset  . Dementia Father     Social History Social History   Tobacco Use  . Smoking status: Current Every Day Smoker    Packs/day: 0.25    Years: 32.00    Pack years: 8.00    Types: Cigarettes  . Smokeless tobacco: Never Used  Substance Use Topics  . Alcohol use: No    Comment: h/o heavy use, quit in 1995  . Drug use: Yes    Frequency: 2.0 times per week    Types: Marijuana     Allergies   Chantix [varenicline]; Codeine; Flu virus vaccine; Lithium; and Varenicline tartrate   Review of Systems Review of Systems  Constitutional:       Per HPI, otherwise negative  HENT:       Per HPI, otherwise  negative  Respiratory:       Per HPI, otherwise negative  Cardiovascular:       Per HPI, otherwise negative  Gastrointestinal: Negative for vomiting.  Endocrine:       Negative aside from HPI  Genitourinary:       Neg aside from HPI   Musculoskeletal:       Per HPI, otherwise negative  Skin: Negative.   Neurological: Positive for weakness. Negative for syncope.     Physical Exam Updated Vital Signs BP 127/69   Pulse 66   Resp 17   Ht 5' 3.5" (1.613 m)   Wt 54 kg (119 lb)   SpO2 96%   BMI 20.75 kg/m   Physical Exam  Constitutional: She is oriented to person, place, and time. She has a sickly appearance. No distress.  HENT:  Head: Normocephalic and atraumatic.  Eyes: Conjunctivae and EOM are normal.  Cardiovascular: Normal rate and regular rhythm.  Pulmonary/Chest: No stridor. She has decreased breath sounds.  Abdominal: She exhibits no distension.  Musculoskeletal: She exhibits no edema.  Neurological: She is alert and oriented to person, place, and time. She displays atrophy. She displays no tremor. No cranial nerve deficit or sensory deficit. She exhibits normal muscle tone. She displays no seizure activity.  Skin: Skin is warm and dry.  Psychiatric: She has a normal mood and affect.  Nursing note and vitals reviewed.    ED Treatments / Results  Labs (all labs ordered are listed, but only abnormal results are displayed) Labs Reviewed  BASIC METABOLIC PANEL - Abnormal; Notable for the following components:      Result Value   Chloride 100 (*)    Glucose, Bld 109 (*)    All other components within normal limits  CBC  TROPONIN I  I-STAT TROPONIN, ED    EKG EKG Interpretation  Date/Time:  Wednesday Mar 12 2018 11:28:32 EDT Ventricular Rate:  74 PR Interval:    QRS Duration: 88 QT Interval:  390 QTC Calculation: 433 R Axis:   86 Text Interpretation:  Sinus rhythm Borderline right axis deviation T wave abnormality Abnormal ekg Confirmed by Carmin Muskrat [phone]) on 03/12/2018 12:30:59 PM   Radiology Dg Chest 2 View  Result Date: 03/12/2018 CLINICAL DATA:  67 year old female with chest pain.  Smoker. EXAM: CHEST - 2 VIEW COMPARISON:  Chest radiograph 05/21/2017 and earlier. FINDINGS: Upright AP and lateral views. Chronic large lung volumes. Mediastinal contours  remain within normal limits. Visualized tracheal air column is within normal limits. No pneumothorax, pulmonary edema, pleural effusion or confluent pulmonary opacity. Mild chronic increased interstitial markings. Osteopenia. No acute osseous abnormality identified. Negative visible bowel gas pattern. IMPRESSION: Chronic lung disease with pulmonary hyperinflation. No acute cardiopulmonary abnormality. Electronically Signed   By: Genevie Ann M.D.   On: 03/12/2018 10:55    Procedures Procedures (including critical care time)  Medications Ordered in ED Medications - No data to display   Initial Impression / Assessment and Plan / ED Course  I have reviewed the triage vital signs and the nursing notes.  Pertinent labs & imaging results that were available during my care of the patient were reviewed by me and considered in my medical decision making (see chart for details).     2:33 PM Patient has been ambulatory, with no distress, no gait difficulty. She continues to deny other complaints, and I have reviewed all findings with her and her husband. 2 normal troponin, nonischemic EKG, reassuring labs, all reassuring, no evidence for coronary ischemia, bacteremia, sepsis, pneumonia. Some suspicion for worsening COPD, given the patient's x-ray, and description of continued cigarette addiction. Patient will start steroids, have a scheduled nebulizer treatments for the next 2 days, follow-up with primary care.  Final Clinical Impressions(s) / ED Diagnoses  Weakness COPD exacerbation   Carmin Muskrat, MD 03/12/18 1434

## 2018-03-27 DIAGNOSIS — D239 Other benign neoplasm of skin, unspecified: Secondary | ICD-10-CM | POA: Diagnosis not present

## 2018-03-27 DIAGNOSIS — J449 Chronic obstructive pulmonary disease, unspecified: Secondary | ICD-10-CM | POA: Diagnosis not present

## 2018-03-27 DIAGNOSIS — Z6821 Body mass index (BMI) 21.0-21.9, adult: Secondary | ICD-10-CM | POA: Diagnosis not present

## 2018-03-27 DIAGNOSIS — M546 Pain in thoracic spine: Secondary | ICD-10-CM | POA: Diagnosis not present

## 2018-03-27 DIAGNOSIS — Z1389 Encounter for screening for other disorder: Secondary | ICD-10-CM | POA: Diagnosis not present

## 2018-03-27 DIAGNOSIS — R0789 Other chest pain: Secondary | ICD-10-CM | POA: Diagnosis not present

## 2018-04-15 DIAGNOSIS — G894 Chronic pain syndrome: Secondary | ICD-10-CM | POA: Diagnosis not present

## 2018-04-15 DIAGNOSIS — Z6821 Body mass index (BMI) 21.0-21.9, adult: Secondary | ICD-10-CM | POA: Diagnosis not present

## 2018-04-15 DIAGNOSIS — R252 Cramp and spasm: Secondary | ICD-10-CM | POA: Diagnosis not present

## 2018-04-15 DIAGNOSIS — J449 Chronic obstructive pulmonary disease, unspecified: Secondary | ICD-10-CM | POA: Diagnosis not present

## 2018-05-14 DIAGNOSIS — Z6820 Body mass index (BMI) 20.0-20.9, adult: Secondary | ICD-10-CM | POA: Diagnosis not present

## 2018-05-14 DIAGNOSIS — G894 Chronic pain syndrome: Secondary | ICD-10-CM | POA: Diagnosis not present

## 2018-05-26 DIAGNOSIS — K219 Gastro-esophageal reflux disease without esophagitis: Secondary | ICD-10-CM | POA: Diagnosis not present

## 2018-05-26 DIAGNOSIS — Z0001 Encounter for general adult medical examination with abnormal findings: Secondary | ICD-10-CM | POA: Diagnosis not present

## 2018-05-26 DIAGNOSIS — Z1389 Encounter for screening for other disorder: Secondary | ICD-10-CM | POA: Diagnosis not present

## 2018-06-11 DIAGNOSIS — K219 Gastro-esophageal reflux disease without esophagitis: Secondary | ICD-10-CM | POA: Diagnosis not present

## 2018-06-11 DIAGNOSIS — G894 Chronic pain syndrome: Secondary | ICD-10-CM | POA: Diagnosis not present

## 2018-06-11 DIAGNOSIS — Z6821 Body mass index (BMI) 21.0-21.9, adult: Secondary | ICD-10-CM | POA: Diagnosis not present

## 2018-06-11 DIAGNOSIS — E782 Mixed hyperlipidemia: Secondary | ICD-10-CM | POA: Diagnosis not present

## 2018-06-23 DIAGNOSIS — J439 Emphysema, unspecified: Secondary | ICD-10-CM | POA: Diagnosis not present

## 2018-06-23 DIAGNOSIS — J22 Unspecified acute lower respiratory infection: Secondary | ICD-10-CM | POA: Diagnosis not present

## 2018-06-23 DIAGNOSIS — J441 Chronic obstructive pulmonary disease with (acute) exacerbation: Secondary | ICD-10-CM | POA: Diagnosis not present

## 2018-06-23 DIAGNOSIS — Z6821 Body mass index (BMI) 21.0-21.9, adult: Secondary | ICD-10-CM | POA: Diagnosis not present

## 2018-07-28 DIAGNOSIS — E748 Other specified disorders of carbohydrate metabolism: Secondary | ICD-10-CM | POA: Diagnosis not present

## 2018-07-28 DIAGNOSIS — R7309 Other abnormal glucose: Secondary | ICD-10-CM | POA: Diagnosis not present

## 2018-07-28 DIAGNOSIS — Z79899 Other long term (current) drug therapy: Secondary | ICD-10-CM | POA: Diagnosis not present

## 2018-09-02 DIAGNOSIS — Z1389 Encounter for screening for other disorder: Secondary | ICD-10-CM | POA: Diagnosis not present

## 2018-09-02 DIAGNOSIS — G894 Chronic pain syndrome: Secondary | ICD-10-CM | POA: Diagnosis not present

## 2018-09-02 DIAGNOSIS — Z6820 Body mass index (BMI) 20.0-20.9, adult: Secondary | ICD-10-CM | POA: Diagnosis not present

## 2018-09-02 DIAGNOSIS — K219 Gastro-esophageal reflux disease without esophagitis: Secondary | ICD-10-CM | POA: Diagnosis not present

## 2018-09-02 DIAGNOSIS — G4700 Insomnia, unspecified: Secondary | ICD-10-CM | POA: Diagnosis not present

## 2018-09-02 DIAGNOSIS — J449 Chronic obstructive pulmonary disease, unspecified: Secondary | ICD-10-CM | POA: Diagnosis not present

## 2018-09-14 ENCOUNTER — Other Ambulatory Visit: Payer: Self-pay

## 2018-09-14 ENCOUNTER — Emergency Department (HOSPITAL_COMMUNITY): Payer: Medicare Other

## 2018-09-14 ENCOUNTER — Emergency Department (HOSPITAL_COMMUNITY)
Admission: EM | Admit: 2018-09-14 | Discharge: 2018-09-14 | Disposition: A | Discharge status: Home / Self Care | Payer: Medicare Other | Attending: Emergency Medicine | Admitting: Emergency Medicine

## 2018-09-14 ENCOUNTER — Encounter (HOSPITAL_COMMUNITY): Payer: Self-pay

## 2018-09-14 DIAGNOSIS — R112 Nausea with vomiting, unspecified: Secondary | ICD-10-CM | POA: Diagnosis not present

## 2018-09-14 DIAGNOSIS — R569 Unspecified convulsions: Secondary | ICD-10-CM | POA: Insufficient documentation

## 2018-09-14 DIAGNOSIS — R110 Nausea: Secondary | ICD-10-CM | POA: Diagnosis present

## 2018-09-14 DIAGNOSIS — J449 Chronic obstructive pulmonary disease, unspecified: Secondary | ICD-10-CM | POA: Insufficient documentation

## 2018-09-14 DIAGNOSIS — R1110 Vomiting, unspecified: Secondary | ICD-10-CM | POA: Diagnosis not present

## 2018-09-14 DIAGNOSIS — Z79899 Other long term (current) drug therapy: Secondary | ICD-10-CM | POA: Insufficient documentation

## 2018-09-14 DIAGNOSIS — R1010 Upper abdominal pain, unspecified: Secondary | ICD-10-CM | POA: Diagnosis not present

## 2018-09-14 DIAGNOSIS — R197 Diarrhea, unspecified: Secondary | ICD-10-CM | POA: Diagnosis not present

## 2018-09-14 DIAGNOSIS — F319 Bipolar disorder, unspecified: Secondary | ICD-10-CM | POA: Diagnosis not present

## 2018-09-14 DIAGNOSIS — F17210 Nicotine dependence, cigarettes, uncomplicated: Secondary | ICD-10-CM | POA: Insufficient documentation

## 2018-09-14 DIAGNOSIS — F1011 Alcohol abuse, in remission: Secondary | ICD-10-CM | POA: Insufficient documentation

## 2018-09-14 DIAGNOSIS — I10 Essential (primary) hypertension: Secondary | ICD-10-CM | POA: Diagnosis not present

## 2018-09-14 DIAGNOSIS — R05 Cough: Secondary | ICD-10-CM | POA: Diagnosis not present

## 2018-09-14 LAB — COMPREHENSIVE METABOLIC PANEL
ALT: 17 U/L (ref 0–44)
ANION GAP: 9 (ref 5–15)
AST: 16 U/L (ref 15–41)
Albumin: 3.7 g/dL (ref 3.5–5.0)
Alkaline Phosphatase: 103 U/L (ref 38–126)
BILIRUBIN TOTAL: 0.6 mg/dL (ref 0.3–1.2)
BUN: 11 mg/dL (ref 8–23)
CHLORIDE: 105 mmol/L (ref 98–111)
CO2: 25 mmol/L (ref 22–32)
Calcium: 8.7 mg/dL — ABNORMAL LOW (ref 8.9–10.3)
Creatinine, Ser: 0.77 mg/dL (ref 0.44–1.00)
Glucose, Bld: 137 mg/dL — ABNORMAL HIGH (ref 70–99)
POTASSIUM: 3.8 mmol/L (ref 3.5–5.1)
Sodium: 139 mmol/L (ref 135–145)
TOTAL PROTEIN: 7.2 g/dL (ref 6.5–8.1)

## 2018-09-14 LAB — CBC WITH DIFFERENTIAL/PLATELET
ABS IMMATURE GRANULOCYTES: 0.04 10*3/uL (ref 0.00–0.07)
BASOS ABS: 0.1 10*3/uL (ref 0.0–0.1)
BASOS PCT: 1 %
EOS ABS: 0 10*3/uL (ref 0.0–0.5)
Eosinophils Relative: 0 %
HCT: 40.5 % (ref 36.0–46.0)
Hemoglobin: 13.4 g/dL (ref 12.0–15.0)
IMMATURE GRANULOCYTES: 1 %
Lymphocytes Relative: 12 %
Lymphs Abs: 0.9 10*3/uL (ref 0.7–4.0)
MCH: 31.4 pg (ref 26.0–34.0)
MCHC: 33.1 g/dL (ref 30.0–36.0)
MCV: 94.8 fL (ref 80.0–100.0)
Monocytes Absolute: 0.4 10*3/uL (ref 0.1–1.0)
Monocytes Relative: 5 %
NEUTROS ABS: 6.5 10*3/uL (ref 1.7–7.7)
NEUTROS PCT: 81 %
NRBC: 0 % (ref 0.0–0.2)
PLATELETS: 290 10*3/uL (ref 150–400)
RBC: 4.27 MIL/uL (ref 3.87–5.11)
RDW: 13.2 % (ref 11.5–15.5)
WBC: 7.9 10*3/uL (ref 4.0–10.5)

## 2018-09-14 LAB — TROPONIN I

## 2018-09-14 LAB — LIPASE, BLOOD: LIPASE: 63 U/L — AB (ref 11–51)

## 2018-09-14 LAB — PHENYTOIN LEVEL, TOTAL: Phenytoin Lvl: 15.6 ug/mL (ref 10.0–20.0)

## 2018-09-14 MED ORDER — SODIUM CHLORIDE 0.9 % IV BOLUS
500.0000 mL | Freq: Once | INTRAVENOUS | Status: AC
Start: 2018-09-14 — End: 2018-09-14
  Administered 2018-09-14: 500 mL via INTRAVENOUS

## 2018-09-14 MED ORDER — ONDANSETRON 4 MG PO TBDP
4.0000 mg | ORAL_TABLET | Freq: Once | ORAL | Status: AC
  Administered 2018-09-14: 4 mg via ORAL
  Filled 2018-09-14: qty 1

## 2018-09-14 NOTE — ED Notes (Signed)
Pt now back from CT.

## 2018-09-14 NOTE — ED Triage Notes (Signed)
Pt reports she woke up 4 am with nausea and vomiting. Pt reports multiple episodes of vomiting. Pt reports also has been coughing

## 2018-09-14 NOTE — ED Notes (Signed)
Pt actively vomiting.

## 2018-09-14 NOTE — ED Provider Notes (Signed)
[facility] EMERGENCY DEPARTMENT Provider Note   CSN: [id number] Arrival date & time: 09/14/18  1053     History   Chief Complaint Chief Complaint  Patient presents with  . Nausea    HPI [name] is a 67 y.o. female.  HPI Patient is slightly drowsy and a poor historian.  States she has been vomiting for unknown number of episodes since 4 AM this morning.  She is also had some loose stool.  She denies any coffee-ground or grossly bloody emesis.  No melanotic or grossly bloody stool.  Denies subjective fever.  Complains of upper abdominal pain.  She also states that she hurts all over but this is not new. Past Medical History:  Diagnosis Date  . Alcohol abuse, in remission   . Anxiety   . Bipolar disorder ([place])   . Colitis   . COPD (chronic obstructive pulmonary disease) ([place])    occasional home O2  . Gastric ulcer   . Hemorrhoids   . IBS (irritable bowel syndrome)   . Seizures ([place])    last seizure was about a year ago    Patient Active Problem List   Diagnosis Date Noted  . COPD (chronic obstructive pulmonary disease) ([place]) 05/30/2017  . Colitis 08/21/2016  . Adrenal mass, left ([place]) 08/21/2016  . Solitary pulmonary nodule 08/21/2016  . Tobacco use disorder 08/21/2016  . DYSPEPSIA 06/13/2010  . IRRITABLE BOWEL SYNDROME 06/13/2010  . HEMATOCHEZIA 06/13/2010  . WEIGHT LOSS, ABNORMAL 06/13/2010  . ABDOMINAL PAIN, GENERALIZED 06/13/2010    Past Surgical History:  Procedure Laterality Date  . ABDOMINAL HYSTERECTOMY    . CHOLECYSTECTOMY    . EYE SURGERY       OB History    Gravida  4   Para  4   Term  4   Preterm      AB      Living  3     SAB      TAB      Ectopic      Multiple      Live Births               Home Medications    Prior to Admission medications   Medication Sig Start Date End Date Taking? Authorizing Provider  albuterol (PROVENTIL HFA;VENTOLIN HFA) 108 (90 BASE) MCG/ACT inhaler Inhale 2 puffs into the lungs  every 6 (six) hours as needed for wheezing or shortness of breath.    Yes [provider]  albuterol (PROVENTIL) (2.5 MG/3ML) 0.083% nebulizer solution Take 2.5 mg by nebulization every 6 (six) hours as needed for wheezing or shortness of breath.    Yes [provider]  clonazePAM (KLONOPIN) 1 MG tablet Take 1 mg by mouth 3 (three) times daily.    Yes [provider]  dicyclomine (BENTYL) 10 MG capsule Take 10 mg by mouth 2 (two) times daily.   Yes [provider]  HYDROcodone-acetaminophen (NORCO) 10-325 MG tablet Take 1 tablet by mouth every 6 (six) hours as needed for moderate pain.  09/02/18  Yes [provider]  mirtazapine (REMERON) 30 MG tablet Take 30 mg by mouth at bedtime.  09/02/18  Yes [provider]  omeprazole (PRILOSEC) 20 MG capsule Take 1 capsule by mouth daily. 10/04/16  Yes [provider]  ondansetron (ZOFRAN) 4 MG tablet Take 4 mg by mouth every 8 (eight) hours as needed for nausea.  09/02/18  Yes [provider]  phenytoin (DILANTIN) 100 MG ER  capsule Take 400 mg by mouth at bedtime.    Yes [provider]  QUEtiapine (SEROQUEL) 25 MG tablet Take 25 mg by mouth 3 (three) times daily.   Yes [provider]  tiZANidine (ZANAFLEX) 4 MG tablet Take 4 mg by mouth every 6 (six) hours as needed.  08/22/18  Yes [provider]  ondansetron (ZOFRAN ODT) 4 MG disintegrating tablet Take 1 tablet (4 mg total) by mouth every 8 (eight) hours as needed for nausea or vomiting. Patient not taking: Reported on 09/14/2018 05/21/17   Francine Graven, DO  predniSONE (DELTASONE) 20 MG tablet Take 2 tablets (40 mg total) by mouth daily with breakfast. For the next four days Patient not taking: Reported on 09/14/2018 03/12/18   Carmin Muskrat, MD    Family History Family History  Problem Relation Age of Onset  . Dementia Father     Social History Social History   Tobacco Use  . Smoking status:  Current Every Day Smoker    Packs/day: 1.00    Years: 32.00    Pack years: 32.00    Types: Cigarettes  . Smokeless tobacco: Never Used  Substance Use Topics  . Alcohol use: No    Comment: h/o heavy use, quit in 1995  . Drug use: Yes    Frequency: 2.0 times per week    Types: Marijuana     Allergies   Chantix [varenicline]; Codeine; Flu virus vaccine; Lithium; and Varenicline tartrate   Review of Systems Review of Systems  Constitutional: Negative for chills and fever.  HENT: Negative for sore throat and trouble swallowing.   Respiratory: Negative for shortness of breath.   Cardiovascular: Negative for chest pain.  Gastrointestinal: Positive for abdominal pain, diarrhea, nausea and vomiting. Negative for blood in stool.  Musculoskeletal: Negative for back pain and neck pain.  Skin: Negative for rash and wound.  Neurological: Negative for dizziness, weakness, light-headedness, numbness and headaches.  All other systems reviewed and are negative.    Physical Exam Updated Vital Signs BP (!) 179/77   Pulse 72   Temp 98 F (36.7 C) (Oral)   Resp (!) 23   Ht 5\' 3"  (1.6 m)   Wt 53.1 kg   SpO2 96%   BMI 20.73 kg/m   Physical Exam  Constitutional: She is oriented to person, place, and time. She appears well-developed and well-nourished. No distress.  HENT:  Head: Normocephalic and atraumatic.  Mouth/Throat: Oropharynx is clear and moist. No oropharyngeal exudate.  Eyes: Pupils are equal, round, and reactive to light. EOM are normal.  Neck: Normal range of motion. Neck supple. No JVD present.  Cardiovascular: Normal rate and regular rhythm. Exam reveals no gallop and no friction rub.  No murmur heard. Pulmonary/Chest: Effort normal and breath sounds normal. No stridor. No respiratory distress. She has no wheezes. She has no rales. She exhibits no tenderness.  Abdominal: Soft. Bowel sounds are normal. There is tenderness. There is no rebound and no guarding.  Epigastric  pain with palpation.  No rebound or guarding.  Musculoskeletal: Normal range of motion. She exhibits no edema or tenderness.  No lower extremity swelling, asymmetry or tenderness.  Lymphadenopathy:    She has no cervical adenopathy.  Neurological: She is oriented to person, place, and time.  Drowsy but arousable.  Moving all extremities without focal deficit.  Sensation intact.  Skin: Skin is warm and dry. Capillary refill takes less than 2 seconds. No rash noted. She is not diaphoretic. No erythema.  Psychiatric:  She has a normal mood and affect. Her behavior is normal.  Nursing note and vitals reviewed.    ED Treatments / Results  Labs (all labs ordered are listed, but only abnormal results are displayed) Labs Reviewed  COMPREHENSIVE METABOLIC PANEL - Abnormal; Notable for the following components:      Result Value   Glucose, Bld 137 (*)    Calcium 8.7 (*)    All other components within normal limits  LIPASE, BLOOD - Abnormal; Notable for the following components:   Lipase 63 (*)    All other components within normal limits  CBC WITH DIFFERENTIAL/PLATELET  TROPONIN I  PHENYTOIN LEVEL, TOTAL    EKG EKG Interpretation  Date/Time:  Sunday September 14 2018 13:31:51 EST Ventricular Rate:  72 PR Interval:    QRS Duration: 86 QT Interval:  407 QTC Calculation: 446 R Axis:   87 Text Interpretation:  Sinus rhythm Borderline right axis deviation Confirmed by Julianne Rice [id number]) on 09/14/2018 1:40:54 PM   Radiology No results found.  Procedures Procedures (including critical care time)  Medications Ordered in ED Medications  ondansetron (ZOFRAN-ODT) disintegrating tablet 4 mg (4 mg Oral Given 09/14/18 1205)  sodium chloride 0.9 % bolus 500 mL (0 mLs Intravenous Stopped 09/14/18 1424)     Initial Impression / Assessment and Plan / ED Course  I have reviewed the triage vital signs and the nursing notes.  Pertinent labs & imaging results that were available during  my care of the patient were reviewed by me and considered in my medical decision making (see chart for details).    Patient with no further vomiting.  Refusing CT abdomen.  Left AMA.   Final Clinical Impressions(s) / ED Diagnoses   Final diagnoses:  Vomiting and diarrhea    ED Discharge Orders    None       Julianne Rice, MD 09/15/18 1320

## 2018-11-28 DIAGNOSIS — G894 Chronic pain syndrome: Secondary | ICD-10-CM | POA: Diagnosis not present

## 2018-11-28 DIAGNOSIS — Z6820 Body mass index (BMI) 20.0-20.9, adult: Secondary | ICD-10-CM | POA: Diagnosis not present

## 2018-11-28 DIAGNOSIS — M1991 Primary osteoarthritis, unspecified site: Secondary | ICD-10-CM | POA: Diagnosis not present

## 2018-11-28 DIAGNOSIS — J449 Chronic obstructive pulmonary disease, unspecified: Secondary | ICD-10-CM | POA: Diagnosis not present

## 2018-12-02 ENCOUNTER — Other Ambulatory Visit (HOSPITAL_COMMUNITY): Payer: Self-pay | Admitting: Internal Medicine

## 2018-12-02 DIAGNOSIS — I739 Peripheral vascular disease, unspecified: Secondary | ICD-10-CM

## 2018-12-08 ENCOUNTER — Telehealth (HOSPITAL_COMMUNITY): Payer: Self-pay

## 2018-12-26 DIAGNOSIS — Z0001 Encounter for general adult medical examination with abnormal findings: Secondary | ICD-10-CM | POA: Diagnosis not present

## 2018-12-26 DIAGNOSIS — G894 Chronic pain syndrome: Secondary | ICD-10-CM | POA: Diagnosis not present

## 2018-12-26 DIAGNOSIS — E782 Mixed hyperlipidemia: Secondary | ICD-10-CM | POA: Diagnosis not present

## 2018-12-26 DIAGNOSIS — E7849 Other hyperlipidemia: Secondary | ICD-10-CM | POA: Diagnosis not present

## 2018-12-26 DIAGNOSIS — Z6822 Body mass index (BMI) 22.0-22.9, adult: Secondary | ICD-10-CM | POA: Diagnosis not present

## 2018-12-26 DIAGNOSIS — Z1389 Encounter for screening for other disorder: Secondary | ICD-10-CM | POA: Diagnosis not present

## 2019-01-16 DIAGNOSIS — M25562 Pain in left knee: Secondary | ICD-10-CM | POA: Diagnosis not present

## 2019-01-16 DIAGNOSIS — J449 Chronic obstructive pulmonary disease, unspecified: Secondary | ICD-10-CM | POA: Diagnosis not present

## 2019-01-16 DIAGNOSIS — G894 Chronic pain syndrome: Secondary | ICD-10-CM | POA: Diagnosis not present

## 2019-01-16 DIAGNOSIS — M79605 Pain in left leg: Secondary | ICD-10-CM | POA: Diagnosis not present

## 2019-01-16 DIAGNOSIS — Z6821 Body mass index (BMI) 21.0-21.9, adult: Secondary | ICD-10-CM | POA: Diagnosis not present

## 2019-02-23 ENCOUNTER — Other Ambulatory Visit: Payer: Self-pay

## 2019-02-23 ENCOUNTER — Other Ambulatory Visit (HOSPITAL_COMMUNITY): Payer: Self-pay | Admitting: Internal Medicine

## 2019-02-23 ENCOUNTER — Ambulatory Visit (HOSPITAL_COMMUNITY)
Admission: RE | Admit: 2019-02-23 | Discharge: 2019-02-23 | Disposition: A | Discharge status: Home / Self Care | Payer: Medicare Other | Source: Ambulatory Visit | Attending: Internal Medicine | Admitting: Internal Medicine

## 2019-02-23 DIAGNOSIS — S8992XA Unspecified injury of left lower leg, initial encounter: Secondary | ICD-10-CM | POA: Diagnosis not present

## 2019-02-23 DIAGNOSIS — J449 Chronic obstructive pulmonary disease, unspecified: Secondary | ICD-10-CM | POA: Diagnosis not present

## 2019-02-23 DIAGNOSIS — M1712 Unilateral primary osteoarthritis, left knee: Secondary | ICD-10-CM | POA: Diagnosis not present

## 2019-02-23 DIAGNOSIS — G894 Chronic pain syndrome: Secondary | ICD-10-CM | POA: Diagnosis not present

## 2019-03-30 DIAGNOSIS — E279 Disorder of adrenal gland, unspecified: Secondary | ICD-10-CM | POA: Diagnosis not present

## 2019-03-30 DIAGNOSIS — M1991 Primary osteoarthritis, unspecified site: Secondary | ICD-10-CM | POA: Diagnosis not present

## 2019-03-30 DIAGNOSIS — G894 Chronic pain syndrome: Secondary | ICD-10-CM | POA: Diagnosis not present

## 2019-03-30 DIAGNOSIS — Z23 Encounter for immunization: Secondary | ICD-10-CM | POA: Diagnosis not present

## 2019-03-30 DIAGNOSIS — Z6820 Body mass index (BMI) 20.0-20.9, adult: Secondary | ICD-10-CM | POA: Diagnosis not present

## 2019-04-09 DIAGNOSIS — J449 Chronic obstructive pulmonary disease, unspecified: Secondary | ICD-10-CM | POA: Diagnosis not present

## 2019-04-09 DIAGNOSIS — M2550 Pain in unspecified joint: Secondary | ICD-10-CM | POA: Diagnosis not present

## 2019-04-09 DIAGNOSIS — G629 Polyneuropathy, unspecified: Secondary | ICD-10-CM | POA: Diagnosis not present

## 2019-04-09 DIAGNOSIS — K219 Gastro-esophageal reflux disease without esophagitis: Secondary | ICD-10-CM | POA: Diagnosis not present

## 2019-04-09 DIAGNOSIS — E279 Disorder of adrenal gland, unspecified: Secondary | ICD-10-CM | POA: Diagnosis not present

## 2019-04-09 DIAGNOSIS — E7849 Other hyperlipidemia: Secondary | ICD-10-CM | POA: Diagnosis not present

## 2019-04-09 DIAGNOSIS — Z6820 Body mass index (BMI) 20.0-20.9, adult: Secondary | ICD-10-CM | POA: Diagnosis not present

## 2019-04-09 DIAGNOSIS — M1991 Primary osteoarthritis, unspecified site: Secondary | ICD-10-CM | POA: Diagnosis not present

## 2019-04-24 ENCOUNTER — Emergency Department (HOSPITAL_COMMUNITY): Payer: Medicare Other

## 2019-04-24 ENCOUNTER — Inpatient Hospital Stay (HOSPITAL_COMMUNITY)
Admission: EM | Admit: 2019-04-24 | Discharge: 2019-04-26 | DRG: 481 | Disposition: A | Discharge status: Home / Self Care | Payer: Medicare Other | Attending: Internal Medicine | Admitting: Internal Medicine

## 2019-04-24 ENCOUNTER — Other Ambulatory Visit: Payer: Self-pay

## 2019-04-24 ENCOUNTER — Encounter (HOSPITAL_COMMUNITY): Payer: Self-pay

## 2019-04-24 DIAGNOSIS — F17210 Nicotine dependence, cigarettes, uncomplicated: Secondary | ICD-10-CM | POA: Diagnosis present

## 2019-04-24 DIAGNOSIS — F1011 Alcohol abuse, in remission: Secondary | ICD-10-CM | POA: Diagnosis present

## 2019-04-24 DIAGNOSIS — Z90710 Acquired absence of both cervix and uterus: Secondary | ICD-10-CM | POA: Diagnosis not present

## 2019-04-24 DIAGNOSIS — W1830XA Fall on same level, unspecified, initial encounter: Secondary | ICD-10-CM | POA: Diagnosis present

## 2019-04-24 DIAGNOSIS — Z8711 Personal history of peptic ulcer disease: Secondary | ICD-10-CM | POA: Diagnosis not present

## 2019-04-24 DIAGNOSIS — Z79891 Long term (current) use of opiate analgesic: Secondary | ICD-10-CM | POA: Diagnosis not present

## 2019-04-24 DIAGNOSIS — S72141D Displaced intertrochanteric fracture of right femur, subsequent encounter for closed fracture with routine healing: Secondary | ICD-10-CM | POA: Diagnosis not present

## 2019-04-24 DIAGNOSIS — D72829 Elevated white blood cell count, unspecified: Secondary | ICD-10-CM

## 2019-04-24 DIAGNOSIS — E785 Hyperlipidemia, unspecified: Secondary | ICD-10-CM | POA: Diagnosis not present

## 2019-04-24 DIAGNOSIS — F419 Anxiety disorder, unspecified: Secondary | ICD-10-CM | POA: Diagnosis present

## 2019-04-24 DIAGNOSIS — G40909 Epilepsy, unspecified, not intractable, without status epilepticus: Secondary | ICD-10-CM | POA: Diagnosis present

## 2019-04-24 DIAGNOSIS — S72144A Nondisplaced intertrochanteric fracture of right femur, initial encounter for closed fracture: Principal | ICD-10-CM | POA: Diagnosis present

## 2019-04-24 DIAGNOSIS — M8580 Other specified disorders of bone density and structure, unspecified site: Secondary | ICD-10-CM | POA: Diagnosis not present

## 2019-04-24 DIAGNOSIS — Y92009 Unspecified place in unspecified non-institutional (private) residence as the place of occurrence of the external cause: Secondary | ICD-10-CM

## 2019-04-24 DIAGNOSIS — F319 Bipolar disorder, unspecified: Secondary | ICD-10-CM

## 2019-04-24 DIAGNOSIS — J449 Chronic obstructive pulmonary disease, unspecified: Secondary | ICD-10-CM

## 2019-04-24 DIAGNOSIS — K219 Gastro-esophageal reflux disease without esophagitis: Secondary | ICD-10-CM

## 2019-04-24 DIAGNOSIS — S8991XA Unspecified injury of right lower leg, initial encounter: Secondary | ICD-10-CM | POA: Diagnosis not present

## 2019-04-24 DIAGNOSIS — F17200 Nicotine dependence, unspecified, uncomplicated: Secondary | ICD-10-CM | POA: Diagnosis not present

## 2019-04-24 DIAGNOSIS — Z1159 Encounter for screening for other viral diseases: Secondary | ICD-10-CM | POA: Diagnosis not present

## 2019-04-24 DIAGNOSIS — D62 Acute posthemorrhagic anemia: Secondary | ICD-10-CM | POA: Diagnosis not present

## 2019-04-24 DIAGNOSIS — Z9981 Dependence on supplemental oxygen: Secondary | ICD-10-CM | POA: Diagnosis not present

## 2019-04-24 DIAGNOSIS — R569 Unspecified convulsions: Secondary | ICD-10-CM

## 2019-04-24 DIAGNOSIS — S72001A Fracture of unspecified part of neck of right femur, initial encounter for closed fracture: Secondary | ICD-10-CM

## 2019-04-24 DIAGNOSIS — I10 Essential (primary) hypertension: Secondary | ICD-10-CM | POA: Diagnosis not present

## 2019-04-24 DIAGNOSIS — Z8781 Personal history of (healed) traumatic fracture: Secondary | ICD-10-CM

## 2019-04-24 DIAGNOSIS — Z79899 Other long term (current) drug therapy: Secondary | ICD-10-CM | POA: Diagnosis not present

## 2019-04-24 DIAGNOSIS — Z03818 Encounter for observation for suspected exposure to other biological agents ruled out: Secondary | ICD-10-CM | POA: Diagnosis not present

## 2019-04-24 DIAGNOSIS — W19XXXA Unspecified fall, initial encounter: Secondary | ICD-10-CM | POA: Diagnosis not present

## 2019-04-24 DIAGNOSIS — F329 Major depressive disorder, single episode, unspecified: Secondary | ICD-10-CM | POA: Diagnosis not present

## 2019-04-24 DIAGNOSIS — S72141A Displaced intertrochanteric fracture of right femur, initial encounter for closed fracture: Secondary | ICD-10-CM | POA: Diagnosis not present

## 2019-04-24 DIAGNOSIS — R52 Pain, unspecified: Secondary | ICD-10-CM | POA: Diagnosis not present

## 2019-04-24 LAB — CBC
HCT: 37.2 % (ref 36.0–46.0)
Hemoglobin: 12.6 g/dL (ref 12.0–15.0)
MCH: 31.5 pg (ref 26.0–34.0)
MCHC: 33.9 g/dL (ref 30.0–36.0)
MCV: 93 fL (ref 80.0–100.0)
Platelets: 258 10*3/uL (ref 150–400)
RBC: 4 MIL/uL (ref 3.87–5.11)
RDW: 12.7 % (ref 11.5–15.5)
WBC: 13.6 10*3/uL — ABNORMAL HIGH (ref 4.0–10.5)
nRBC: 0 % (ref 0.0–0.2)

## 2019-04-24 LAB — BASIC METABOLIC PANEL
Anion gap: 10 (ref 5–15)
BUN: 11 mg/dL (ref 8–23)
CO2: 25 mmol/L (ref 22–32)
Calcium: 9.3 mg/dL (ref 8.9–10.3)
Chloride: 104 mmol/L (ref 98–111)
Creatinine, Ser: 0.74 mg/dL (ref 0.44–1.00)
GFR calc Af Amer: >60 mL/min (ref >60)
GFR calc non Af Amer: >60 mL/min (ref >60)
Glucose, Bld: 137 mg/dL — ABNORMAL HIGH (ref 70–99)
Potassium: 3.8 mmol/L (ref 3.5–5.1)
Sodium: 139 mmol/L (ref 135–145)

## 2019-04-24 LAB — CK: Total CK: 171 U/L (ref 38–234)

## 2019-04-24 LAB — SURGICAL PCR SCREEN
MRSA, PCR: NEGATIVE
Staphylococcus aureus: NEGATIVE

## 2019-04-24 LAB — SARS CORONAVIRUS 2 BY RT PCR (HOSPITAL ORDER, PERFORMED IN ~~LOC~~ HOSPITAL LAB): SARS Coronavirus 2: NEGATIVE

## 2019-04-24 MED ORDER — ADULT MULTIVITAMIN W/MINERALS CH
1.0000 | ORAL_TABLET | Freq: Every day | ORAL | Status: DC
  Administered 2019-04-25: 1 via ORAL
  Filled 2019-04-24: qty 1

## 2019-04-24 MED ORDER — CLONAZEPAM 0.5 MG PO TABS: 1.0000 mg | ORAL_TABLET | Freq: Three times a day (TID) | ORAL | Status: DC | PRN

## 2019-04-24 MED ORDER — ATORVASTATIN CALCIUM 20 MG PO TABS
20.0000 mg | ORAL_TABLET | Freq: Every day | ORAL | Status: DC
  Administered 2019-04-24 – 2019-04-26 (×3): 20 mg via ORAL
  Filled 2019-04-24 (×2): qty 1
  Filled 2019-04-24: qty 2

## 2019-04-24 MED ORDER — HYDROMORPHONE HCL 1 MG/ML IJ SOLN
1.0000 mg | Freq: Once | INTRAMUSCULAR | Status: AC
  Administered 2019-04-24: 09:00:00 1 mg via INTRAVENOUS
  Filled 2019-04-24: qty 1

## 2019-04-24 MED ORDER — DICYCLOMINE HCL 10 MG PO CAPS
10.0000 mg | ORAL_CAPSULE | Freq: Two times a day (BID) | ORAL | Status: DC
  Administered 2019-04-24 – 2019-04-26 (×3): 10 mg via ORAL
  Filled 2019-04-24 (×3): qty 1

## 2019-04-24 MED ORDER — ZOLPIDEM TARTRATE 5 MG PO TABS: 5.0000 mg | ORAL_TABLET | Freq: Every evening | ORAL | Status: DC | PRN

## 2019-04-24 MED ORDER — HEPARIN SODIUM (PORCINE) 5000 UNIT/ML IJ SOLN
5000.0000 [IU] | Freq: Three times a day (TID) | INTRAMUSCULAR | Status: DC
  Administered 2019-04-24 – 2019-04-25 (×2): 5000 [IU] via SUBCUTANEOUS
  Filled 2019-04-24 (×2): qty 1

## 2019-04-24 MED ORDER — HYDROCODONE-ACETAMINOPHEN 5-325 MG PO TABS: 1.0000 | ORAL_TABLET | Freq: Four times a day (QID) | ORAL | Status: DC | PRN

## 2019-04-24 MED ORDER — MUPIROCIN 2 % EX OINT: 1.0000 "application " | TOPICAL_OINTMENT | Freq: Two times a day (BID) | CUTANEOUS | Status: DC

## 2019-04-24 MED ORDER — NICOTINE 14 MG/24HR TD PT24
14.0000 mg | MEDICATED_PATCH | Freq: Every day | TRANSDERMAL | Status: DC
  Administered 2019-04-24 – 2019-04-26 (×3): 14 mg via TRANSDERMAL
  Filled 2019-04-24 (×3): qty 1

## 2019-04-24 MED ORDER — SODIUM CHLORIDE 0.9 % IV SOLN
INTRAVENOUS | Status: DC
  Administered 2019-04-24: 16:00:00 via INTRAVENOUS

## 2019-04-24 MED ORDER — CHLORHEXIDINE GLUCONATE 4 % EX LIQD
60.0000 mL | Freq: Once | CUTANEOUS | Status: DC
  Filled 2019-04-24: qty 60

## 2019-04-24 MED ORDER — PHENYTOIN SODIUM EXTENDED 100 MG PO CAPS
400.0000 mg | ORAL_CAPSULE | Freq: Every day | ORAL | Status: DC
  Administered 2019-04-24 – 2019-04-25 (×2): 400 mg via ORAL
  Filled 2019-04-24 (×2): qty 4

## 2019-04-24 MED ORDER — CLONAZEPAM 0.5 MG PO TABS
1.0000 mg | ORAL_TABLET | Freq: Three times a day (TID) | ORAL | Status: DC
  Administered 2019-04-24: 16:00:00 1 mg via ORAL
  Filled 2019-04-24: qty 2

## 2019-04-24 MED ORDER — TRAZODONE HCL 50 MG PO TABS
100.0000 mg | ORAL_TABLET | Freq: Every evening | ORAL | Status: DC
  Administered 2019-04-24 – 2019-04-25 (×2): 100 mg via ORAL
  Filled 2019-04-24 (×2): qty 2

## 2019-04-24 MED ORDER — QUETIAPINE FUMARATE 25 MG PO TABS
25.0000 mg | ORAL_TABLET | Freq: Three times a day (TID) | ORAL | Status: DC
  Administered 2019-04-24 – 2019-04-26 (×5): 25 mg via ORAL
  Filled 2019-04-24 (×5): qty 1

## 2019-04-24 MED ORDER — MIRTAZAPINE 30 MG PO TABS
30.0000 mg | ORAL_TABLET | Freq: Every day | ORAL | Status: DC
  Administered 2019-04-24 – 2019-04-25 (×2): 30 mg via ORAL
  Filled 2019-04-24: qty 2
  Filled 2019-04-24: qty 1

## 2019-04-24 MED ORDER — ENSURE ENLIVE PO LIQD
237.0000 mL | Freq: Two times a day (BID) | ORAL | Status: DC
  Administered 2019-04-25 – 2019-04-26 (×3): 237 mL via ORAL

## 2019-04-24 MED ORDER — MORPHINE SULFATE (PF) 2 MG/ML IV SOLN
0.5000 mg | INTRAVENOUS | Status: DC | PRN
  Administered 2019-04-24 – 2019-04-26 (×9): 1 mg via INTRAVENOUS
  Filled 2019-04-24 (×10): qty 1

## 2019-04-24 MED ORDER — ALBUTEROL SULFATE (2.5 MG/3ML) 0.083% IN NEBU: 2.5000 mg | INHALATION_SOLUTION | Freq: Four times a day (QID) | RESPIRATORY_TRACT | Status: DC | PRN

## 2019-04-24 MED ORDER — CEFAZOLIN SODIUM-DEXTROSE 2-4 GM/100ML-% IV SOLN
2.0000 g | INTRAVENOUS | Status: DC
  Filled 2019-04-24: qty 100

## 2019-04-24 MED ORDER — POVIDONE-IODINE 10 % EX SWAB: 2.0000 "application " | Freq: Once | CUTANEOUS | Status: DC

## 2019-04-24 MED ORDER — PANTOPRAZOLE SODIUM 40 MG PO TBEC
40.0000 mg | DELAYED_RELEASE_TABLET | Freq: Every day | ORAL | Status: DC
  Administered 2019-04-24 – 2019-04-26 (×3): 40 mg via ORAL
  Filled 2019-04-24 (×3): qty 1

## 2019-04-24 NOTE — H&P (Signed)
History and Physical    [name] NID:[id number] DOB: 27-Dec-1950 DOA: 04/24/2019  Referring MD/NP/PA: Dr. Sabra Heck. PCP: Redmond School, MD  Patient coming from: home   Chief Complaint: right hip pain and inability to bear weight   HPI: [name] is a 67 y.o. female with a past medical history significant seizure disorder, bipolar disorder, tobacco abuse, COPD and gastroesophageal reflux disease; who presented to the hospital secondary to right hip pain and inability to bear weight.  Patient experienced mechanical fall around 11 PM the night prior to admission, was assisted by her husband to go back on bed and then experienced difficulty bearing weight and uncontrolled pain this morning.  Patient denies any fever, chills, dysuria, hematuria, melena, hematochezia, chest pain, shortness of breath, headaches, blurred vision, focal weakness or any other complaints.  In the ED imaging studies demonstrated nondisplaced right intertrochanteric femur fracture; no signs of acute infection and just mild elevation of WBCs at follow-up abnormalities on her blood work.  EKG without any ischemic changes.  Orthopedic service has been contacted and planning to perform surgical repair on 04/25/2019. [facility] consulted for admission.  Past Medical/Surgical History: Past Medical History:  Diagnosis Date  . Alcohol abuse, in remission   . Anxiety   . Bipolar disorder ([place])   . Colitis   . COPD (chronic obstructive pulmonary disease) ([place])    occasional home O2  . Gastric ulcer   . Hemorrhoids   . IBS (irritable bowel syndrome)   . Seizures ([place])    last seizure was about a year ago    Past Surgical History:  Procedure Laterality Date  . ABDOMINAL HYSTERECTOMY    . CHOLECYSTECTOMY    . EYE SURGERY      Social History:  reports that she has been smoking cigarettes. She has a 32.00 pack-year smoking history. She has never used smokeless tobacco. She reports current drug use. Drug: Marijuana. She  reports that she does not drink alcohol.  Allergies: Allergies  Allergen Reactions  . Chantix [Varenicline]   . Codeine Nausea And Vomiting  . Flu Virus Vaccine Nausea And Vomiting  . Lithium Nausea And Vomiting  . Varenicline Tartrate Hives    Family History:  Family History  Problem Relation Age of Onset  . Dementia Father     Prior to Admission medications   Medication Sig Start Date End Date Taking? Authorizing Provider  albuterol (PROVENTIL HFA;VENTOLIN HFA) 108 (90 BASE) MCG/ACT inhaler Inhale 2 puffs into the lungs every 6 (six) hours as needed for wheezing or shortness of breath.     [provider]  albuterol (PROVENTIL) (2.5 MG/3ML) 0.083% nebulizer solution Take 2.5 mg by nebulization every 6 (six) hours as needed for wheezing or shortness of breath.     [provider]  atorvastatin (LIPITOR) 20 MG tablet Take 1 tablet by mouth daily. 01/08/19   [provider]  clonazePAM (KLONOPIN) 1 MG tablet Take 1 mg by mouth 3 (three) times daily.     [provider]  dicyclomine (BENTYL) 10 MG capsule Take 10 mg by mouth 2 (two) times daily.    [provider]  HYDROcodone-acetaminophen (NORCO) 10-325 MG tablet Take 1 tablet by mouth every 6 (six) hours as needed for moderate pain.  09/02/18   [provider]  mirtazapine (REMERON) 30 MG tablet Take 30 mg by mouth at bedtime.  09/02/18   [provider]  omeprazole (PRILOSEC) 20 MG capsule Take 1 capsule by mouth daily. 10/04/16  [provider]  ondansetron (ZOFRAN) 4 MG tablet Take 4 mg by mouth every 8 (eight) hours as needed for nausea.  09/02/18   [provider]  phenytoin (DILANTIN) 100 MG ER capsule Take 400 mg by mouth at bedtime.     [provider]  QUEtiapine (SEROQUEL) 25 MG tablet Take 25 mg by mouth 3 (three) times daily.    [provider]  tiZANidine (ZANAFLEX) 4 MG tablet Take 4 mg by mouth every 6 (six) hours as needed.   08/22/18   [provider]  traZODone (DESYREL) 100 MG tablet Take 1 tablet by mouth every evening. 04/09/19   [provider]  zolpidem (AMBIEN) 10 MG tablet Take 1 tablet by mouth at bedtime as needed. 02/12/19   [provider]    Review of Systems:  Negative except as otherwise mentioned in HPI.   Physical Exam: Vitals:   04/24/19 1200 04/24/19 1230 04/24/19 1349 04/24/19 1356  BP: (!) 148/90 139/82 (!) 141/73   Pulse: 85 75 92   Resp: 11 16    Temp:   98.6 F (37 C)   TempSrc:   Oral   SpO2: 98% 91% 95%   Weight:    53.8 kg  Height:    5\' 3"  (1.6 m)    Constitutional: Afebrile, no chest pain, no shortness of breath, no nausea, no vomiting.  Patient reports right hip pain.  Eyes: PERRL, lids and conjunctivae normal, no icterus, no nystagmus. ENMT: Mucous membranes are moist. Posterior pharynx clear of any exudate or lesions.  Neck: normal, supple, no masses, no thyromegaly, no JVD Respiratory: Good air movement bilaterally, positive rhonchi, no crackles. Normal respiratory effort. No accessory muscle use.  Cardiovascular: Regular rate and rhythm, no murmurs / rubs / gallops. No extremity edema. 2+ pedal pulses. No carotid bruits.  Abdomen: no tenderness, no masses palpated. No hepatosplenomegaly. Bowel sounds positive.  Musculoskeletal: no clubbing / cyanosis.  Normal muscle tone, right lower extremity with decreased range of motion and tenderness with movement. Skin: no rashes, lesions, ulcers. No induration Neurologic: CN 2-12 grossly intact. Sensation intact, DTR normal. Strength 5/5 in all 4.  Psychiatric: Normal judgment and insight. Alert and oriented x 3. Normal mood.    Labs on Admission: I have personally reviewed the following labs and imaging studies  CBC: Recent Labs  Lab 04/24/19 0911  WBC 13.6*  HGB 12.6  HCT 37.2  MCV 93.0  PLT 878   Basic Metabolic Panel: Recent Labs  Lab 04/24/19 0911  NA 139  K 3.8  CL 104  CO2 25   GLUCOSE 137*  BUN 11  CREATININE 0.74  CALCIUM 9.3   GFR: Estimated Creatinine Clearance: 54.9 mL/min (by C-G formula based on SCr of 0.74 mg/dL).  Cardiac Enzymes: Recent Labs  Lab 04/24/19 0911  CKTOTAL 171   Urine analysis:    Component Value Date/Time   COLORURINE STRAW (A) 05/21/2017 1847   APPEARANCEUR CLEAR 05/21/2017 1847   LABSPEC 1.013 05/21/2017 1847   PHURINE 8.0 05/21/2017 1847   GLUCOSEU 50 (A) 05/21/2017 1847   HGBUR SMALL (A) 05/21/2017 1847   BILIRUBINUR NEGATIVE 05/21/2017 1847   KETONESUR NEGATIVE 05/21/2017 1847   PROTEINUR NEGATIVE 05/21/2017 1847   UROBILINOGEN 0.2 11/12/2012 1530   NITRITE NEGATIVE 05/21/2017 1847   LEUKOCYTESUR NEGATIVE 05/21/2017 1847    Recent Results (from the past 240 hour(s))  SARS Coronavirus 2 (CEPHEID - Performed in [facility] hospital lab), [facility]  Status: None   Collection Time: 04/24/19 11:15 AM   Specimen: Nasopharyngeal Swab  Result Value Ref Range Status   SARS Coronavirus 2 NEGATIVE NEGATIVE Final    Comment: (NOTE) If result is NEGATIVE SARS-CoV-2 target nucleic acids are NOT DETECTED. The SARS-CoV-2 RNA is generally detectable in upper and lower  respiratory specimens during the acute phase of infection. The lowest  concentration of SARS-CoV-2 viral copies this assay can detect is 250  copies / mL. A negative result does not preclude SARS-CoV-2 infection  and should not be used as the sole basis for treatment or other  patient management decisions.  A negative result may occur with  improper specimen collection / handling, submission of specimen other  than nasopharyngeal swab, presence of viral mutation(s) within the  areas targeted by this assay, and inadequate number of viral copies  (<250 copies / mL). A negative result must be combined with clinical  observations, patient history, and epidemiological information. If result is POSITIVE SARS-CoV-2 target nucleic acids are DETECTED. The  SARS-CoV-2 RNA is generally detectable in upper and lower  respiratory specimens dur ing the acute phase of infection.  Positive  results are indicative of active infection with SARS-CoV-2.  Clinical  correlation with patient history and other diagnostic information is  necessary to determine patient infection status.  Positive results do  not rule out bacterial infection or co-infection with other viruses. If result is PRESUMPTIVE POSTIVE SARS-CoV-2 nucleic acids MAY BE PRESENT.   A presumptive positive result was obtained on the submitted specimen  and confirmed on repeat testing.  While 2019 novel coronavirus  (SARS-CoV-2) nucleic acids may be present in the submitted sample  additional confirmatory testing may be necessary for epidemiological  and / or clinical management purposes  to differentiate between  SARS-CoV-2 and other Sarbecovirus currently known to infect humans.  If clinically indicated additional testing with an alternate test  methodology [id number]) is advised. The SARS-CoV-2 RNA is generally  detectable in upper and lower respiratory sp ecimens during the acute  phase of infection. The expected result is Negative. Fact Sheet for Patients:  StrictlyIdeas.no Fact Sheet for Healthcare Providers: BankingDealers.co.za This test is not yet approved or cleared by the [place] FDA and has been authorized for detection and/or diagnosis of SARS-CoV-2 by FDA under an Emergency Use Authorization (EUA).  This EUA will remain in effect (meaning this test can be used) for the duration of the COVID-19 declaration under Section 564(b)(1) of the Act, 21 U.S.C. section 360bbb-3(b)(1), unless the authorization is terminated or revoked sooner. Performed at [facility], [address] [place], [place] [address]      Radiological Exams on Admission: Dg Pelvis 1-2 Views  Result Date: 04/24/2019 CLINICAL DATA:  Pt fell in her home  today. Pt c/o severe pain in Rt hip with intermittent radiation of pain into Rt lower extremity. Pt unable to tolerate positioning for additional lateral of knee at this time EXAM: PELVIS - 1-2 VIEW COMPARISON:  CT 05/21/2017 FINDINGS: Minimally distracted right intertrochanteric femur fracture. No dislocation. Bony pelvis intact. IMPRESSION: Right intertrochanteric femur fracture. Electronically Signed   By: Lucrezia Europe M.D.   On: 04/24/2019 10:30   Dg Knee Complete 4 Views Right  Result Date: 04/24/2019 CLINICAL DATA:  Pt fell in her home today. Pt c/o severe pain in Rt hip with intermittent radiation of pain into Rt lower extremity. Pt unable to tolerate positioning for additional lateral of knee at this time EXAM: RIGHT KNEE -  COMPLETE 4+ VIEW COMPARISON:  None. FINDINGS: Osteopenia. Small marginal spurs about the medial and lateral compartments of the knee. No fracture or dislocation. No definite effusion. IMPRESSION: 1. Negative for fracture or other acute bone abnormality. 2. Early  degenerative spurring. Electronically Signed   By: Lucrezia Europe M.D.   On: 04/24/2019 10:29   Dg Femur Min 2 Views Right  Result Date: 04/24/2019 CLINICAL DATA:  Pt fell in her home today. Pt c/o severe pain in Rt hip with intermittent radiation of pain into Rt lower extremity. Pt unable to tolerate positioning for additional lateral of knee at this time EXAM: RIGHT FEMUR 2 VIEWS COMPARISON:  None. FINDINGS: Minimally distracted intertrochanteric fracture. No dislocation. Distal femoral shaft intact, mildly osteopenic. IMPRESSION: Minimally distracted right intertrochanteric femur fracture Electronically Signed   By: Lucrezia Europe M.D.   On: 04/24/2019 10:28    EKG: Independently reviewed.  Normal axis, sinus rhythm, normal QT.  No signs of acute ischemia appreciated.  Assessment/Plan 1-right closed hip fracture (HCC) -Secondary to mechanical fall -Nondisplaced intratrochanteric femur fracture -Orthopedic service has  been consulted -Patient will be kept nonweightbearing, PRN analgesics and n.p.o. after midnight. -Most likely surgical repair in a.m. -Moderate risk for surgical intervention given history of COPD with ongoing tobacco abuse. -Physical therapy evaluation after surgery to determine patient's capability for safe discharge. -Continue supportive care and gentle hydration.  2-GERD (gastroesophageal reflux disease) -continue PPI  3-tobacco use disorder -I have discussed tobacco cessation with the patient.  I have counseled the patient regarding the negative impacts of continued tobacco use including but not limited to lung cancer, COPD, and cardiovascular disease.  I have discussed alternatives to tobacco and modalities that may help facilitate tobacco cessation including but not limited to biofeedback, hypnosis, and medications.  Total time spent with tobacco counseling was 4 minutes. -nicotine patch ordered   4-COPD (chronic obstructive pulmonary disease) (HCC) -Good oxygen saturation on room air next-no complaining of shortness of breath and currently no wheezing -Continue PRN albuterol.  5-depression/anxiety -Continue Remeron, Seroquel, trazodone and as needed Klonopin.    6-Leukocytosis -Appears to be secondary to stress demargination -No acute complaints or source of infection -Provide gentle fluid resuscitation -Follow WBCs trend  7-hyperlipidemia -Continue statins.  8-history of seizure disorders -Last seizure more than 2 years ago according to patient -Continue phenytoin -Seizure precaution.  DVT prophylaxis: heparin   Code Status: Full Code. Family Communication: noNfamily at bedside  Disposition Plan: To be determined; hopefully capable of going home with home health services and family care.  But after surgery will assess with physical therapy to determine patient capability for safe discharge. Consults called: Orthopedic service (Dr. Aline Brochure) Admission status:  Inpatient, length of stay more than 2 midnights; MedSurg.   Time Spent: 60 minutes  Barton Dubois MD [facility] Pager [phone]  04/24/2019, 3:49 PM

## 2019-04-24 NOTE — ED Provider Notes (Signed)
[facility] EMERGENCY DEPARTMENT Provider Note   CSN: [id number] Arrival date & time: 04/24/19  0845    History   Chief Complaint Chief Complaint  Patient presents with   Leg Pain   Fall    HPI [name] is a 68 y.o. female.     HPI  The patient is a 68 year old female, she has a known history of bipolar disorder, COPD, history of seizures and a history of prior alcohol abuse though she states she does not drink anymore.  She reports that after having a fall within the last couple of months on her left side she has been favoring her left leg but has been able to walk however last night she had a fall when she lost her balance and slipped landing on the linoleum floor on her buttock and right leg.  Since that time she has had severe pain in the right hip.  She reports that her husband was able to help her get back into bed where she stayed all night but this morning was unable to get out because of severe pain.  Paramedics were called and report that the patient had no abnormal findings on the general appearance but severe pain with any range of motion of her leg.  No medications were given prehospital, the patient denies other injuries including head injury neck pain or bilateral upper extremity pain.  She has no chest pain coughing or shortness of breath and has not been febrile.  She denies prior fractures of her legs.  My review of the prior medical record shows that the patient has had atrial fibrillation in the past, this is not listed as a chronic medical condition and she is not anticoagulated.  Past Medical History:  Diagnosis Date   Alcohol abuse, in remission    Anxiety    Bipolar disorder ([place])    Colitis    COPD (chronic obstructive pulmonary disease) (HCC)    occasional home O2   Gastric ulcer    Hemorrhoids    IBS (irritable bowel syndrome)    Seizures ([place])    last seizure was about a year ago    Patient Active Problem List   Diagnosis Date  Noted   COPD (chronic obstructive pulmonary disease) ([place]) 05/30/2017   Colitis 08/21/2016   Adrenal mass, left ([place]) 08/21/2016   Solitary pulmonary nodule 08/21/2016   Tobacco use disorder 08/21/2016   DYSPEPSIA 06/13/2010   IRRITABLE BOWEL SYNDROME 06/13/2010   HEMATOCHEZIA 06/13/2010   WEIGHT LOSS, ABNORMAL 06/13/2010   ABDOMINAL PAIN, GENERALIZED 06/13/2010    Past Surgical History:  Procedure Laterality Date   ABDOMINAL HYSTERECTOMY     CHOLECYSTECTOMY     EYE SURGERY       OB History    Gravida  4   Para  4   Term  4   Preterm      AB      Living  3     SAB      TAB      Ectopic      Multiple      Live Births               Home Medications    Prior to Admission medications   Medication Sig Start Date End Date Taking? Authorizing Provider  albuterol (PROVENTIL HFA;VENTOLIN HFA) 108 (90 BASE) MCG/ACT inhaler Inhale 2 puffs into the lungs every 6 (six) hours as needed for wheezing or shortness of breath.  [provider]  albuterol (PROVENTIL) (2.5 MG/3ML) 0.083% nebulizer solution Take 2.5 mg by nebulization every 6 (six) hours as needed for wheezing or shortness of breath.     [provider]  clonazePAM (KLONOPIN) 1 MG tablet Take 1 mg by mouth 3 (three) times daily.     [provider]  dicyclomine (BENTYL) 10 MG capsule Take 10 mg by mouth 2 (two) times daily.    [provider]  HYDROcodone-acetaminophen (NORCO) 10-325 MG tablet Take 1 tablet by mouth every 6 (six) hours as needed for moderate pain.  09/02/18   [provider]  mirtazapine (REMERON) 30 MG tablet Take 30 mg by mouth at bedtime.  09/02/18   [provider]  omeprazole (PRILOSEC) 20 MG capsule Take 1 capsule by mouth daily. 10/04/16   [provider]  ondansetron (ZOFRAN ODT) 4 MG disintegrating tablet Take 1 tablet (4 mg total) by mouth every 8 (eight) hours as needed for nausea or vomiting. Patient not  taking: Reported on 09/14/2018 05/21/17   Francine Graven, DO  ondansetron (ZOFRAN) 4 MG tablet Take 4 mg by mouth every 8 (eight) hours as needed for nausea.  09/02/18   [provider]  phenytoin (DILANTIN) 100 MG ER capsule Take 400 mg by mouth at bedtime.     [provider]  predniSONE (DELTASONE) 20 MG tablet Take 2 tablets (40 mg total) by mouth daily with breakfast. For the next four days Patient not taking: Reported on 09/14/2018 03/12/18   Carmin Muskrat, MD  QUEtiapine (SEROQUEL) 25 MG tablet Take 25 mg by mouth 3 (three) times daily.    [provider]  tiZANidine (ZANAFLEX) 4 MG tablet Take 4 mg by mouth every 6 (six) hours as needed.  08/22/18   [provider]    Family History Family History  Problem Relation Age of Onset   Dementia Father     Social History Social History   Tobacco Use   Smoking status: Current Every Day Smoker    Packs/day: 1.00    Years: 32.00    Pack years: 32.00    Types: Cigarettes   Smokeless tobacco: Never Used  Substance Use Topics   Alcohol use: No    Comment: h/o heavy use, quit in 1995   Drug use: Yes    Types: Marijuana     Allergies   Chantix [varenicline], Codeine, Flu virus vaccine, Lithium, and Varenicline tartrate   Review of Systems Review of Systems  All other systems reviewed and are negative.    Physical Exam Updated Vital Signs BP 139/79    Pulse 83    Temp 98.5 F (36.9 C) (Oral)    Resp 20    SpO2 93%   Physical Exam Vitals signs and nursing note reviewed.  Constitutional:      General: She is not in acute distress.    Appearance: She is well-developed.  HENT:     Head: Normocephalic and atraumatic.     Mouth/Throat:     Pharynx: No oropharyngeal exudate.  Eyes:     General: No scleral icterus.       Right eye: No discharge.        Left eye: No discharge.     Conjunctiva/sclera: Conjunctivae normal.     Pupils: Pupils are equal, round, and reactive to  light.  Neck:     Musculoskeletal: Normal range of motion and neck supple.     Thyroid: No thyromegaly.     Vascular:  No JVD.  Cardiovascular:     Rate and Rhythm: Normal rate and regular rhythm.     Heart sounds: Normal heart sounds. No murmur. No friction rub. No gallop.   Pulmonary:     Effort: Pulmonary effort is normal. No respiratory distress.     Breath sounds: Normal breath sounds. No wheezing or rales.  Abdominal:     General: Bowel sounds are normal. There is no distension.     Palpations: Abdomen is soft. There is no mass.     Tenderness: There is no abdominal tenderness.  Musculoskeletal:        General: Tenderness present. No swelling or deformity.     Comments: Severe pain with any touching of the right lower extremity from the hip to the knee.  She is unable to move this leg at all secondary to pain, there is no obvious leg length discrepancy.  All other compartments are soft including the bilateral upper extremities and the left lower extremity.  Lymphadenopathy:     Cervical: No cervical adenopathy.  Skin:    General: Skin is warm and dry.     Findings: No erythema or rash.  Neurological:     General: No focal deficit present.     Mental Status: She is alert.     Coordination: Coordination normal.  Psychiatric:        Behavior: Behavior normal.      ED Treatments / Results  Labs (all labs ordered are listed, but only abnormal results are displayed) Labs Reviewed  CBC - Abnormal; Notable for the following components:      Result Value   WBC 13.6 (*)    All other components within normal limits  BASIC METABOLIC PANEL - Abnormal; Notable for the following components:   Glucose, Bld 137 (*)    All other components within normal limits  CK    EKG EKG Interpretation  Date/Time:  Friday April 24 2019 11:02:29 EDT Ventricular Rate:  85 PR Interval:    QRS Duration: 95 QT Interval:  353 QTC Calculation: 420 R Axis:   88 Text Interpretation:  Sinus  rhythm Borderline right axis deviation Baseline wander in lead(s) V6 since last tracing no significant change Confirmed by [name] [phone]) on 04/24/2019 11:16:19 AM   Radiology Dg Pelvis 1-2 Views  Result Date: 04/24/2019 CLINICAL DATA:  Pt fell in her home today. Pt c/o severe pain in Rt hip with intermittent radiation of pain into Rt lower extremity. Pt unable to tolerate positioning for additional lateral of knee at this time EXAM: PELVIS - 1-2 VIEW COMPARISON:  CT 05/21/2017 FINDINGS: Minimally distracted right intertrochanteric femur fracture. No dislocation. Bony pelvis intact. IMPRESSION: Right intertrochanteric femur fracture. Electronically Signed   By: Lucrezia Europe M.D.   On: 04/24/2019 10:30   Dg Knee Complete 4 Views Right  Result Date: 04/24/2019 CLINICAL DATA:  Pt fell in her home today. Pt c/o severe pain in Rt hip with intermittent radiation of pain into Rt lower extremity. Pt unable to tolerate positioning for additional lateral of knee at this time EXAM: RIGHT KNEE - COMPLETE 4+ VIEW COMPARISON:  None. FINDINGS: Osteopenia. Small marginal spurs about the medial and lateral compartments of the knee. No fracture or dislocation. No definite effusion. IMPRESSION: 1. Negative for fracture or other acute bone abnormality. 2. Early  degenerative spurring. Electronically Signed   By: Lucrezia Europe M.D.   On: 04/24/2019 10:29   Dg Femur Min 2 Views Right  Result Date:  04/24/2019 CLINICAL DATA:  Pt fell in her home today. Pt c/o severe pain in Rt hip with intermittent radiation of pain into Rt lower extremity. Pt unable to tolerate positioning for additional lateral of knee at this time EXAM: RIGHT FEMUR 2 VIEWS COMPARISON:  None. FINDINGS: Minimally distracted intertrochanteric fracture. No dislocation. Distal femoral shaft intact, mildly osteopenic. IMPRESSION: Minimally distracted right intertrochanteric femur fracture Electronically Signed   By: Lucrezia Europe M.D.   On: 04/24/2019 10:28     Procedures Procedures (including critical care time)  Medications Ordered in ED Medications  HYDROmorphone (DILAUDID) injection 1 mg (1 mg Intravenous Given 04/24/19 0910)     Initial Impression / Assessment and Plan / ED Course  I have reviewed the triage vital signs and the nursing notes.  Pertinent labs & imaging results that were available during my care of the patient were reviewed by me and considered in my medical decision making (see chart for details).  Clinical Course as of Apr 23 1208  Fri Apr 24, 2019  1057 I have personally viewed the patient's lab work-up including a slight leukocytosis of 13,000, otherwise normal renal function and electrolytes.  Unfortunately the x-ray shows an intertrochanteric fracture on the right.  I will call orthopedics, Dr. Aline Brochure is on call and then follow-up with hospitalist for formal admission.  N.p.o., pain medication given in the form of Dilaudid with good relief.   [BM]  1117 EKG is consistent with NSR, record reviewed and confirmed with patient, she is not taking anticoagulants   [BM]  1145 disussed with hospitalist at 1145 who will admit   [BM]  1147 Discussed with surgeon, plans on operation for tomorrow   [BM]    Clinical Course User Index [BM] [name], MD       The patient has what appears to be an injury to the right leg whether this is all muscular as she has tenderness with any touching of the muscles or whether this is a joint or bony fracture will need to be evaluated with x-ray.  Pain medications ordered to make the patient more comfortable.  No other associated injuries, no fever.  Final Clinical Impressions(s) / ED Diagnoses   Final diagnoses:  Closed fracture of right hip, initial encounter [facility])      [name], MD 04/25/19 [phone]

## 2019-04-24 NOTE — Progress Notes (Addendum)
Initial Nutrition Assessment  DOCUMENTATION CODES:  Not applicable  INTERVENTION:   Post operatively:   Ensure Enlive po BID, each supplement provides 350 kcal and 20 grams of protein   MVI with minerals  NUTRITION DIAGNOSIS:  Increased nutrient needs related to post-op fracture healing as evidenced by estimated nutrition requirements for this outcome  GOAL:  Patient will meet greater than or equal to 90% of their needs  MONITOR:  Labs, I & O's, Supplement acceptance, TF tolerance, Weight trends, Skin, PO intake, Diet advancement  REASON FOR ASSESSMENT:  Consult Hip fracture protocol  ASSESSMENT:  68 y/o female PMHx IBS, Anxiety, Bipolar disorder, COPD, Alcohol abuse (in remission). Presents after falling in kitchen and having immediate pain to R upper leg/thigh. In ED, imaging shows minimally displaced R femur fx. Admitted for potential surgical intervention.   RD consulted as part of hip fx protocol. Pt had only just arrived to floor at time of RD arrival and as such there is little existing info in chart. She was experiencing severe pain, occasionally abruptly screaming out. Only spoke with pt briefly.   Took information regarding her nutritional baseline. She reports she eats only 1x a day. She does not take a mvi, but does take Vit D, Calcium. She does not follow any sort of therapeutic diet. She does not drink any kind of nutritional beverage and reports only drinking sprite and water. She reports a UBW of 113-121 lbs. She is currently 118.6 lbs. Chart shows pt has been 115-120 lbs x3 yrs   Pt reports she was told she would be undergoing surgery. RD noted pts diet does not sound to be nutritionally adequate at baseline and he recommended patient start supplements post operatively. Explained she needs extra kcals, protein and vitamins for wound healing. Pt was hesitant but ultimately agreed to atleast try them. Will order Ensure Enlive and MVI with minerals postoperatively.    RD did not perform NFPE d/t covid precautions and patients severe pain, though visually appears malnourished/frail and older than listed age.   Labs: WBC:13.6, Glu:137 Meds: Remeron, Dilantin, PPI, Bentyl, Klonopin, seroquel, prn morphine  Recent Labs  Lab 04/24/19 0911  NA 139  K 3.8  CL 104  CO2 25  BUN 11  CREATININE 0.74  CALCIUM 9.3  GLUCOSE 137*   NUTRITION - FOCUSED PHYSICAL EXAM: Deferred d/t covid/pain  Diet Order:   Diet Order            Diet NPO time specified  Diet effective midnight        Diet NPO time specified  Diet effective midnight        Diet NPO time specified  Diet effective now             EDUCATION NEEDS:  No education needs have been identified at this time  Skin:  Skin Assessment: Reviewed RN Assessment  Last BM:  Unknown/PTA  Height:  Ht Readings from Last 1 Encounters:  04/24/19 5\' 3"  (1.6 m)   Weight:  Wt Readings from Last 1 Encounters:  04/24/19 53.8 kg   Wt Readings from Last 10 Encounters:  04/24/19 53.8 kg  09/14/18 53.1 kg  03/12/18 54 kg  07/26/17 53.1 kg  07/16/17 53.1 kg  05/30/17 53.5 kg  05/21/17 54.4 kg  10/11/16 52.2 kg  08/23/16 52.2 kg  08/21/16 52.3 kg   Ideal Body Weight:  52.27 kg  BMI:  Body mass index is 21.01 kg/m.  Estimated Nutritional Needs:  Kcal:  1600-1800 (30-33  kcal/kg bw) Protein:  75-85g Pro (1.4-1.6g/kg bw) Fluid:  >1.6 L fluid (30 ml/kg bw)  Burtis Junes RD, LDN, CNSC Clinical Nutrition Available Tues-Sat via Pager: [phone] 04/24/2019 2:43 PM

## 2019-04-24 NOTE — ED Triage Notes (Signed)
Pt reports falling in her kitchen approx 11pm. Husband helped her to bed. Pt has severe pain right upper leg and thigh

## 2019-04-24 NOTE — ED Notes (Signed)
ED TO INPATIENT HANDOFF REPORT  ED Nurse Name and Phone #: Osie Cheeks Name/Age/Gender [name] 68 y.o. female Room/Bed: APA02/APA02  Code Status   Code Status: Full Code  Home/SNF/Other Home Patient oriented to: self, place, time and situation Is this baseline? Yes   Triage Complete: Triage complete  Chief Complaint Fall  Triage Note Pt reports falling in her kitchen approx 11pm. Husband helped her to bed. Pt has severe pain right upper leg and thigh   Allergies Allergies  Allergen Reactions  . Chantix [Varenicline]   . Codeine Nausea And Vomiting  . Flu Virus Vaccine Nausea And Vomiting  . Lithium Nausea And Vomiting  . Varenicline Tartrate Hives    Level of Care/Admitting Diagnosis ED Disposition    ED Disposition Condition [place] [facility] Area: [facility] [[id number]]  Level of Care: Med-Surg [16]  Covid Evaluation: Screening Protocol (No Symptoms)  Diagnosis: Closed hip fracture [facility]) [[id number]]  Admitting Physician: Barton Dubois [[id number]]  Attending Physician: Barton Dubois [[id number]]  Estimated length of stay: past midnight tomorrow  Certification:: I certify this patient will need inpatient services for at least 2 midnights  PT Class (Do Not Modify): Inpatient [101]  PT Acc Code (Do Not Modify): Private [1]       B Medical/Surgery History Past Medical History:  Diagnosis Date  . Alcohol abuse, in remission   . Anxiety   . Bipolar disorder ([place])   . Colitis   . COPD (chronic obstructive pulmonary disease) ([place])    occasional home O2  . Gastric ulcer   . Hemorrhoids   . IBS (irritable bowel syndrome)   . Seizures ([place])    last seizure was about a year ago   Past Surgical History:  Procedure Laterality Date  . ABDOMINAL HYSTERECTOMY    . CHOLECYSTECTOMY    . EYE SURGERY       A IV Location/Drains/Wounds Patient Lines/Drains/Airways Status   Active Line/Drains/Airways    Name:   Placement date:   Placement time:   Site:    Days:   Peripheral IV 04/24/19 Right Antecubital   04/24/19    0943    Antecubital   less than 1          Intake/Output Last 24 hours No intake or output data in the 24 hours ending 04/24/19 1204  Labs/Imaging Results for orders placed or performed during the hospital encounter of 04/24/19 (from the past 48 hour(s))  CK     Status: None   Collection Time: 04/24/19  9:11 AM  Result Value Ref Range   Total CK 171 38 - 234 U/L    Comment: Performed at [facility], [address], [place], [place] [address]  CBC     Status: Abnormal   Collection Time: 04/24/19  9:11 AM  Result Value Ref Range   WBC 13.6 (H) 4.0 - 10.5 K/uL   RBC 4.00 3.87 - 5.11 MIL/uL   Hemoglobin 12.6 12.0 - 15.0 g/dL   HCT 37.2 36.0 - 46.0 %   MCV 93.0 80.0 - 100.0 fL   MCH 31.5 26.0 - 34.0 pg   MCHC 33.9 30.0 - 36.0 g/dL   RDW 12.7 11.5 - 15.5 %   Platelets 258 150 - 400 K/uL   nRBC 0.0 0.0 - 0.2 %    Comment: Performed at [facility], [address] [place], [place] [address]  Basic metabolic panel     Status: Abnormal   Collection Time:  04/24/19  9:11 AM  Result Value Ref Range   Sodium 139 135 - 145 mmol/L   Potassium 3.8 3.5 - 5.1 mmol/L   Chloride 104 98 - 111 mmol/L   CO2 25 22 - 32 mmol/L   Glucose, Bld 137 (H) 70 - 99 mg/dL   BUN 11 8 - 23 mg/dL   Creatinine, Ser 0.74 0.44 - 1.00 mg/dL   Calcium 9.3 8.9 - 10.3 mg/dL   GFR calc non Af Amer >60 >60 mL/min   GFR calc Af Amer >60 >60 mL/min   Anion gap 10 5 - 15    Comment: Performed at [facility], [address], [place], [place] [address]   Dg Pelvis 1-2 Views  Result Date: 04/24/2019 CLINICAL DATA:  Pt fell in her home today. Pt c/o severe pain in Rt hip with intermittent radiation of pain into Rt lower extremity. Pt unable to tolerate positioning for additional lateral of knee at this time EXAM: PELVIS - 1-2 VIEW COMPARISON:  CT 05/21/2017 FINDINGS: Minimally distracted right intertrochanteric femur fracture. No dislocation. Bony pelvis  intact. IMPRESSION: Right intertrochanteric femur fracture. Electronically Signed   By: Lucrezia Europe M.D.   On: 04/24/2019 10:30   Dg Knee Complete 4 Views Right  Result Date: 04/24/2019 CLINICAL DATA:  Pt fell in her home today. Pt c/o severe pain in Rt hip with intermittent radiation of pain into Rt lower extremity. Pt unable to tolerate positioning for additional lateral of knee at this time EXAM: RIGHT KNEE - COMPLETE 4+ VIEW COMPARISON:  None. FINDINGS: Osteopenia. Small marginal spurs about the medial and lateral compartments of the knee. No fracture or dislocation. No definite effusion. IMPRESSION: 1. Negative for fracture or other acute bone abnormality. 2. Early  degenerative spurring. Electronically Signed   By: Lucrezia Europe M.D.   On: 04/24/2019 10:29   Dg Femur Min 2 Views Right  Result Date: 04/24/2019 CLINICAL DATA:  Pt fell in her home today. Pt c/o severe pain in Rt hip with intermittent radiation of pain into Rt lower extremity. Pt unable to tolerate positioning for additional lateral of knee at this time EXAM: RIGHT FEMUR 2 VIEWS COMPARISON:  None. FINDINGS: Minimally distracted intertrochanteric fracture. No dislocation. Distal femoral shaft intact, mildly osteopenic. IMPRESSION: Minimally distracted right intertrochanteric femur fracture Electronically Signed   By: Lucrezia Europe M.D.   On: 04/24/2019 10:28    Pending Labs Unresulted Labs (From admission, onward)    Start     Ordered   04/25/19 0500  CBC  Tomorrow morning,   R     04/24/19 1157   04/25/19 6314  Basic metabolic panel  Tomorrow morning,   R     04/24/19 1157   04/24/19 1155  VITAMIN D 25 Hydroxy (Vit-D Deficiency, Fractures)  Once,   STAT     04/24/19 1157   04/24/19 1153  HIV antibody (Routine Testing)  Once,   STAT     04/24/19 1157   04/24/19 1153  CBC  (heparin)  Once,   STAT    Comments: Baseline for heparin therapy IF NOT ALREADY DRAWN.  Notify MD if PLT < 100 K.    04/24/19 1157   04/24/19 1153  Creatinine,  serum  (heparin)  Once,   STAT    Comments: Baseline for heparin therapy IF NOT ALREADY DRAWN.    04/24/19 1157   04/24/19 1115  SARS Coronavirus 2 (CEPHEID - Performed in [place] hospital lab), [facility]  Once,  STAT    Question:  Rule Out  Answer:  Yes   04/24/19 1114          Vitals/Pain Today's Vitals   04/24/19 0854 04/24/19 0930 04/24/19 1130 04/24/19 1200  BP:  139/79 (!) 144/75 (!) 148/90  Pulse:  83 87 85  Resp:   (!) 9 11  Temp:      TempSrc:      SpO2:  93% 95% 98%  PainSc: 10-Worst pain ever       Isolation Precautions No active isolations  Medications Medications  HYDROcodone-acetaminophen (NORCO/VICODIN) 5-325 MG per tablet 1-2 tablet (has no administration in time range)  heparin injection 5,000 Units (has no administration in time range)  0.9 %  sodium chloride infusion (has no administration in time range)  morphine 2 MG/ML injection 0.5-1 mg (has no administration in time range)  zolpidem (AMBIEN) tablet 10 mg (has no administration in time range)  traZODone (DESYREL) tablet 100 mg (has no administration in time range)  QUEtiapine (SEROQUEL) tablet 25 mg (has no administration in time range)  phenytoin (DILANTIN) ER capsule 400 mg (has no administration in time range)  mirtazapine (REMERON) tablet 30 mg (has no administration in time range)  pantoprazole (PROTONIX) EC tablet 40 mg (has no administration in time range)  clonazePAM (KLONOPIN) tablet 1 mg (has no administration in time range)  dicyclomine (BENTYL) capsule 10 mg (has no administration in time range)  atorvastatin (LIPITOR) tablet 20 mg (has no administration in time range)  albuterol (PROVENTIL) (2.5 MG/3ML) 0.083% nebulizer solution 2.5 mg (has no administration in time range)  HYDROmorphone (DILAUDID) injection 1 mg (1 mg Intravenous Given 04/24/19 0910)    Mobility non-ambulatory High fall risk   Focused Assessments    R Recommendations: See Admitting Provider  Note  Report given to:   Additional Notes:

## 2019-04-25 ENCOUNTER — Inpatient Hospital Stay (HOSPITAL_COMMUNITY): Payer: Medicare Other

## 2019-04-25 ENCOUNTER — Inpatient Hospital Stay (HOSPITAL_COMMUNITY): Payer: Medicare Other | Admitting: Anesthesiology

## 2019-04-25 ENCOUNTER — Encounter (HOSPITAL_COMMUNITY)
Admission: EM | Disposition: A | Discharge status: Home / Self Care | Payer: Self-pay | Source: Home / Self Care | Attending: Internal Medicine

## 2019-04-25 ENCOUNTER — Encounter (HOSPITAL_COMMUNITY): Payer: Self-pay | Admitting: Anesthesiology

## 2019-04-25 DIAGNOSIS — S72144A Nondisplaced intertrochanteric fracture of right femur, initial encounter for closed fracture: Principal | ICD-10-CM

## 2019-04-25 DIAGNOSIS — F329 Major depressive disorder, single episode, unspecified: Secondary | ICD-10-CM

## 2019-04-25 HISTORY — PX: COMPRESSION HIP SCREW: SHX1386

## 2019-04-25 LAB — CBC
HCT: 36.4 % (ref 36.0–46.0)
Hemoglobin: 12 g/dL (ref 12.0–15.0)
MCH: 31.2 pg (ref 26.0–34.0)
MCHC: 33 g/dL (ref 30.0–36.0)
MCV: 94.5 fL (ref 80.0–100.0)
Platelets: 215 10*3/uL (ref 150–400)
RBC: 3.85 MIL/uL — ABNORMAL LOW (ref 3.87–5.11)
RDW: 12.9 % (ref 11.5–15.5)
WBC: 8.5 10*3/uL (ref 4.0–10.5)
nRBC: 0 % (ref 0.0–0.2)

## 2019-04-25 LAB — VITAMIN D 25 HYDROXY (VIT D DEFICIENCY, FRACTURES): Vit D, 25-Hydroxy: 31.9 ng/mL (ref 30.0–100.0)

## 2019-04-25 LAB — PREPARE RBC (CROSSMATCH)

## 2019-04-25 LAB — BASIC METABOLIC PANEL
Anion gap: 10 (ref 5–15)
BUN: 12 mg/dL (ref 8–23)
CO2: 25 mmol/L (ref 22–32)
Calcium: 8.5 mg/dL — ABNORMAL LOW (ref 8.9–10.3)
Chloride: 102 mmol/L (ref 98–111)
Creatinine, Ser: 0.72 mg/dL (ref 0.44–1.00)
GFR calc Af Amer: >60 mL/min (ref >60)
GFR calc non Af Amer: >60 mL/min (ref >60)
Glucose, Bld: 101 mg/dL — ABNORMAL HIGH (ref 70–99)
Potassium: 3.6 mmol/L (ref 3.5–5.1)
Sodium: 137 mmol/L (ref 135–145)

## 2019-04-25 LAB — HIV ANTIBODY (ROUTINE TESTING W REFLEX): HIV Screen 4th Generation wRfx: NONREACTIVE

## 2019-04-25 LAB — ABO/RH: ABO/RH(D): O POS

## 2019-04-25 SURGERY — COMPRESSION HIP
Anesthesia: General | Laterality: Right

## 2019-04-25 MED ORDER — SUCCINYLCHOLINE CHLORIDE 20 MG/ML IJ SOLN
INTRAMUSCULAR | Status: DC | PRN
  Administered 2019-04-25: 120 mg via INTRAVENOUS

## 2019-04-25 MED ORDER — METOCLOPRAMIDE HCL 5 MG/ML IJ SOLN: 5.0000 mg | Freq: Three times a day (TID) | INTRAMUSCULAR | Status: DC | PRN

## 2019-04-25 MED ORDER — FENTANYL CITRATE (PF) 100 MCG/2ML IJ SOLN
INTRAMUSCULAR | Status: AC
  Filled 2019-04-25: qty 2

## 2019-04-25 MED ORDER — SUGAMMADEX SODIUM 200 MG/2ML IV SOLN
INTRAVENOUS | Status: DC | PRN
  Administered 2019-04-25: 150 mg via INTRAVENOUS

## 2019-04-25 MED ORDER — PROPOFOL 10 MG/ML IV BOLUS
INTRAVENOUS | Status: DC | PRN
  Administered 2019-04-25: 150 mg via INTRAVENOUS

## 2019-04-25 MED ORDER — SODIUM CHLORIDE 0.9 % IV SOLN
INTRAVENOUS | Status: DC
  Administered 2019-04-25: 16:00:00 via INTRAVENOUS

## 2019-04-25 MED ORDER — SENNOSIDES-DOCUSATE SODIUM 8.6-50 MG PO TABS: 1.0000 | ORAL_TABLET | Freq: Every evening | ORAL | Status: DC | PRN

## 2019-04-25 MED ORDER — ROCURONIUM BROMIDE 100 MG/10ML IV SOLN
INTRAVENOUS | Status: DC | PRN
  Administered 2019-04-25: 50 mg via INTRAVENOUS

## 2019-04-25 MED ORDER — ROCURONIUM BROMIDE 10 MG/ML (PF) SYRINGE
PREFILLED_SYRINGE | INTRAVENOUS | Status: AC
  Filled 2019-04-25: qty 10

## 2019-04-25 MED ORDER — ASPIRIN EC 325 MG PO TBEC
325.0000 mg | DELAYED_RELEASE_TABLET | Freq: Every day | ORAL | Status: DC
  Administered 2019-04-26: 325 mg via ORAL
  Filled 2019-04-25: qty 1

## 2019-04-25 MED ORDER — CEFAZOLIN SODIUM-DEXTROSE 2-3 GM-%(50ML) IV SOLR
INTRAVENOUS | Status: DC | PRN
  Administered 2019-04-25: 2 g via INTRAVENOUS

## 2019-04-25 MED ORDER — ONDANSETRON HCL 4 MG/2ML IJ SOLN: 4.0000 mg | Freq: Four times a day (QID) | INTRAMUSCULAR | Status: DC | PRN

## 2019-04-25 MED ORDER — BUPIVACAINE-EPINEPHRINE (PF) 0.5% -1:200000 IJ SOLN
INTRAMUSCULAR | Status: AC
  Filled 2019-04-25: qty 60

## 2019-04-25 MED ORDER — BISACODYL 5 MG PO TBEC: 5.0000 mg | DELAYED_RELEASE_TABLET | Freq: Every day | ORAL | Status: DC | PRN

## 2019-04-25 MED ORDER — MIDAZOLAM HCL 2 MG/2ML IJ SOLN
INTRAMUSCULAR | Status: DC | PRN
  Administered 2019-04-25: 2 mg via INTRAVENOUS

## 2019-04-25 MED ORDER — ONDANSETRON HCL 4 MG PO TABS: 4.0000 mg | ORAL_TABLET | Freq: Four times a day (QID) | ORAL | Status: DC | PRN

## 2019-04-25 MED ORDER — BUPIVACAINE-EPINEPHRINE (PF) 0.5% -1:200000 IJ SOLN
INTRAMUSCULAR | Status: DC | PRN
  Administered 2019-04-25 (×2): 30 mL via PERINEURAL

## 2019-04-25 MED ORDER — PHENYLEPHRINE HCL (PRESSORS) 10 MG/ML IV SOLN
INTRAVENOUS | Status: DC | PRN
  Administered 2019-04-25: 40 ug via INTRAVENOUS
  Administered 2019-04-25 (×2): 80 ug via INTRAVENOUS

## 2019-04-25 MED ORDER — METHOCARBAMOL 500 MG PO TABS: 500.0000 mg | ORAL_TABLET | Freq: Four times a day (QID) | ORAL | Status: DC | PRN

## 2019-04-25 MED ORDER — FLEET ENEMA 7-19 GM/118ML RE ENEM: 1.0000 | ENEMA | Freq: Once | RECTAL | Status: DC | PRN

## 2019-04-25 MED ORDER — MIDAZOLAM HCL 2 MG/2ML IJ SOLN
INTRAMUSCULAR | Status: AC
  Filled 2019-04-25: qty 2

## 2019-04-25 MED ORDER — MENTHOL 3 MG MT LOZG: 1.0000 | LOZENGE | OROMUCOSAL | Status: DC | PRN

## 2019-04-25 MED ORDER — PROPOFOL 10 MG/ML IV BOLUS
INTRAVENOUS | Status: AC
  Filled 2019-04-25: qty 20

## 2019-04-25 MED ORDER — TRAMADOL HCL 50 MG PO TABS
50.0000 mg | ORAL_TABLET | Freq: Four times a day (QID) | ORAL | Status: DC
  Administered 2019-04-25 – 2019-04-26 (×4): 50 mg via ORAL
  Filled 2019-04-25 (×4): qty 1

## 2019-04-25 MED ORDER — ALUM & MAG HYDROXIDE-SIMETH 200-200-20 MG/5ML PO SUSP: 30.0000 mL | ORAL | Status: DC | PRN

## 2019-04-25 MED ORDER — METOCLOPRAMIDE HCL 10 MG PO TABS: 5.0000 mg | ORAL_TABLET | Freq: Three times a day (TID) | ORAL | Status: DC | PRN

## 2019-04-25 MED ORDER — CEFAZOLIN SODIUM-DEXTROSE 2-4 GM/100ML-% IV SOLN
2.0000 g | Freq: Four times a day (QID) | INTRAVENOUS | Status: AC
  Administered 2019-04-25 (×2): 2 g via INTRAVENOUS
  Filled 2019-04-25 (×2): qty 100

## 2019-04-25 MED ORDER — SODIUM CHLORIDE 0.9% IV SOLUTION
Freq: Once | INTRAVENOUS | Status: AC
  Administered 2019-04-25: 08:00:00 via INTRAVENOUS

## 2019-04-25 MED ORDER — ONDANSETRON HCL 4 MG/2ML IJ SOLN
INTRAMUSCULAR | Status: DC | PRN
  Administered 2019-04-25: 4 mg via INTRAVENOUS

## 2019-04-25 MED ORDER — 0.9 % SODIUM CHLORIDE (POUR BTL) OPTIME
TOPICAL | Status: DC | PRN
  Administered 2019-04-25: 11:00:00 1000 mL

## 2019-04-25 MED ORDER — PHENOL 1.4 % MT LIQD: 1.0000 | OROMUCOSAL | Status: DC | PRN

## 2019-04-25 MED ORDER — DOCUSATE SODIUM 100 MG PO CAPS
100.0000 mg | ORAL_CAPSULE | Freq: Two times a day (BID) | ORAL | Status: DC
  Administered 2019-04-25 – 2019-04-26 (×2): 100 mg via ORAL
  Filled 2019-04-25 (×2): qty 1

## 2019-04-25 MED ORDER — ACETAMINOPHEN 10 MG/ML IV SOLN
1000.0000 mg | Freq: Four times a day (QID) | INTRAVENOUS | Status: AC
  Administered 2019-04-25 – 2019-04-26 (×4): 1000 mg via INTRAVENOUS
  Filled 2019-04-25 (×7): qty 100

## 2019-04-25 MED ORDER — LACTATED RINGERS IV SOLN
INTRAVENOUS | Status: DC | PRN
  Administered 2019-04-25: 12:00:00 via INTRAVENOUS

## 2019-04-25 MED ORDER — FENTANYL CITRATE (PF) 100 MCG/2ML IJ SOLN
INTRAMUSCULAR | Status: DC | PRN
  Administered 2019-04-25 (×2): 25 ug via INTRAVENOUS
  Administered 2019-04-25: 100 ug via INTRAVENOUS
  Administered 2019-04-25: 50 ug via INTRAVENOUS

## 2019-04-25 MED ORDER — KETOROLAC TROMETHAMINE 30 MG/ML IJ SOLN
INTRAMUSCULAR | Status: DC | PRN
  Administered 2019-04-25: 30 mg via INTRAVENOUS

## 2019-04-25 MED ORDER — METHOCARBAMOL 1000 MG/10ML IJ SOLN
500.0000 mg | Freq: Four times a day (QID) | INTRAVENOUS | Status: DC | PRN
  Filled 2019-04-25: qty 5

## 2019-04-25 SURGICAL SUPPLY — 68 items
APL PRP STRL LF DISP 70% ISPRP (MISCELLANEOUS) ×1
APL SKNCLS STERI-STRIP NONHPOA (GAUZE/BANDAGES/DRESSINGS) ×1
BENZOIN TINCTURE PRP APPL 2/3 (GAUZE/BANDAGES/DRESSINGS) ×2 IMPLANT
BIT DRILL TWIST 3.5MM (BIT) ×1 IMPLANT
BLADE SURG SZ10 CARB STEEL (BLADE) ×6 IMPLANT
BNDG GAUZE ELAST 4 BULKY (GAUZE/BANDAGES/DRESSINGS) ×3 IMPLANT
CHLORAPREP W/TINT 26 (MISCELLANEOUS) ×3 IMPLANT
CLOSURE STERI STRIP 1/2 X4 (GAUZE/BANDAGES/DRESSINGS) ×2 IMPLANT
CLOTH BEACON ORANGE TIMEOUT ST (SAFETY) ×3 IMPLANT
COVER LIGHT HANDLE STERIS (MISCELLANEOUS) ×6 IMPLANT
COVER MAYO STAND XLG (DRAPE) ×3 IMPLANT
DECANTER SPIKE VIAL GLASS SM (MISCELLANEOUS) ×3 IMPLANT
DRAPE STERI IOBAN 125X83 (DRAPES) ×3 IMPLANT
DRESSING MEPILEX BORDER 6X8 (GAUZE/BANDAGES/DRESSINGS) IMPLANT
DRILL OMEGA BONE (BIT) IMPLANT
DRILL TWIST 3.5MM (BIT) ×3
DRILL TWIST HELICAL 1/4IN (BIT) IMPLANT
DRSG MEPILEX BORDER 4X12 (GAUZE/BANDAGES/DRESSINGS) IMPLANT
DRSG MEPILEX BORDER 4X8 (GAUZE/BANDAGES/DRESSINGS) ×3 IMPLANT
DRSG MEPILEX BORDER 6X8 (GAUZE/BANDAGES/DRESSINGS) ×3
ELECT REM PT RETURN 9FT ADLT (ELECTROSURGICAL) ×3
ELECTRODE REM PT RTRN 9FT ADLT (ELECTROSURGICAL) ×1 IMPLANT
GLOVE BIOGEL M 6.5 STRL (GLOVE) ×2 IMPLANT
GLOVE BIOGEL PI IND STRL 7.0 (GLOVE) ×1 IMPLANT
GLOVE BIOGEL PI INDICATOR 7.0 (GLOVE) ×2
GLOVE ECLIPSE 6.5 STRL STRAW (GLOVE) ×2 IMPLANT
GLOVE ECLIPSE 7.5 STRL STRAW (GLOVE) ×2 IMPLANT
GLOVE SKINSENSE NS SZ8.0 LF (GLOVE) ×2
GLOVE SKINSENSE STRL SZ8.0 LF (GLOVE) ×1 IMPLANT
GLOVE SS N UNI LF 8.5 STRL (GLOVE) ×3 IMPLANT
GOWN STRL REUS W/TWL LRG LVL3 (GOWN DISPOSABLE) ×3 IMPLANT
GOWN STRL REUS W/TWL XL LVL3 (GOWN DISPOSABLE) ×3 IMPLANT
GUIDE PIN THREADED (PIN) ×3
IMMOBILIZER SHOULDER LGE (ORTHOPEDIC SUPPLIES) IMPLANT
IMMOBILIZER SHOULDER MED (ORTHOPEDIC SUPPLIES) IMPLANT
INST SET MAJOR BONE (KITS) ×3 IMPLANT
KIT BLADEGUARD II DBL (SET/KITS/TRAYS/PACK) ×3 IMPLANT
KIT TURNOVER CYSTO (KITS) ×3 IMPLANT
MANIFOLD NEPTUNE II (INSTRUMENTS) ×3 IMPLANT
MARKER SKIN DUAL TIP RULER LAB (MISCELLANEOUS) ×3 IMPLANT
NDL HYPO 21X1.5 SAFETY (NEEDLE) ×1 IMPLANT
NDL SPNL 18GX3.5 QUINCKE PK (NEEDLE) ×1 IMPLANT
NEEDLE HYPO 21X1.5 SAFETY (NEEDLE) ×3 IMPLANT
NEEDLE SPNL 18GX3.5 QUINCKE PK (NEEDLE) ×3 IMPLANT
NS IRRIG 1000ML POUR BTL (IV SOLUTION) ×3 IMPLANT
PACK BASIC III (CUSTOM PROCEDURE TRAY) ×3
PACK SRG BSC III STRL LF ECLPS (CUSTOM PROCEDURE TRAY) ×1 IMPLANT
PAD ABD 5X9 TENDERSORB (GAUZE/BANDAGES/DRESSINGS) ×3 IMPLANT
PAD ARMBOARD 7.5X6 YLW CONV (MISCELLANEOUS) ×3 IMPLANT
PENCIL HANDSWITCHING (ELECTRODE) ×3 IMPLANT
PIN GUIDE THREADED (PIN) IMPLANT
PLATE 130 DEGREE 2HOLE (Plate) ×2 IMPLANT
SCREW COMPRESSION 19.0M (Screw) ×2 IMPLANT
SCREW CORTICAL SFTP 4.5X38MM (Screw) ×2 IMPLANT
SCREW CORTICAL SFTP 4.5X40MM (Screw) ×2 IMPLANT
SCREW LAG 100MM (Screw) ×2 IMPLANT
SET BASIN LINEN APH (SET/KITS/TRAYS/PACK) ×3 IMPLANT
SPONGE LAP 18X18 RF (DISPOSABLE) ×6 IMPLANT
STAPLER VISISTAT 35W (STAPLE) ×3 IMPLANT
SUT BRALON NAB BRD #1 30IN (SUTURE) ×6 IMPLANT
SUT MNCRL 0 VIOLET CTX 36 (SUTURE) ×1 IMPLANT
SUT MON AB 2-0 SH 27 (SUTURE) ×3
SUT MON AB 2-0 SH27 (SUTURE) IMPLANT
SUT MONOCRYL 0 CTX 36 (SUTURE) ×6
SYR 30ML LL (SYRINGE) ×3 IMPLANT
SYR BULB IRRIGATION 50ML (SYRINGE) ×3 IMPLANT
TRAY FOLEY MTR SLVR 16FR STAT (SET/KITS/TRAYS/PACK) ×3 IMPLANT
YANKAUER SUCT 12FT TUBE ARGYLE (SUCTIONS) ×3 IMPLANT

## 2019-04-25 NOTE — Anesthesia Procedure Notes (Signed)
Procedure Name: Intubation Performed by: Vena Rua, MD Pre-anesthesia Checklist: Patient identified Patient Re-evaluated:Patient Re-evaluated prior to induction Oxygen Delivery Method: Circle system utilized Preoxygenation: Pre-oxygenation with 100% oxygen Induction Type: IV induction, Rapid sequence and Cricoid Pressure applied Ventilation: Mask ventilation without difficulty Laryngoscope Size: Mac and 4 Grade View: Grade III Tube type: Oral Tube size: 7.0 mm Number of attempts: 1 Secured at: 21 cm

## 2019-04-25 NOTE — Transfer of Care (Signed)
Immediate Anesthesia Transfer of Care Note  Patient: [name]  Procedure(s) Performed: COMPRESSION HIP (Right )  Patient Location: PACU  Anesthesia Type:General  Level of Consciousness: awake, alert  and oriented  Airway & Oxygen Therapy: Patient Spontanous Breathing and Patient connected to nasal cannula oxygen  Post-op Assessment: Report given to RN, Post -op Vital signs reviewed and stable and Patient moving all extremities  Post vital signs: Reviewed and stable  Last Vitals:  Vitals Value Taken Time  BP    Temp    Pulse 95 04/25/19 1241  Resp    SpO2 91 % 04/25/19 1241  Vitals shown include unvalidated device data.  Last Pain:  Vitals:   04/25/19 0755  TempSrc:   PainSc: 10-Worst pain ever      Patients Stated Pain Goal: 2 (87/21/58 7276)  Complications: No apparent anesthesia complications

## 2019-04-25 NOTE — Progress Notes (Signed)
Went to instruct pt on incentive spirometry but pt already had an IS and states she knows how to use it

## 2019-04-25 NOTE — Progress Notes (Signed)
PROGRESS NOTE    FAUNA NEUNER  SWN:[id number] DOB: 1951/05/20 DOA: 04/24/2019 PCP: Redmond School, MD     Brief Narrative:  68 y.o. female with a past medical history significant seizure disorder, bipolar disorder, tobacco abuse, COPD and gastroesophageal reflux disease; who presented to the hospital secondary to right hip pain and inability to bear weight.  Patient experienced mechanical fall around 11 PM the night prior to admission, was assisted by her husband to go back on bed and then experienced difficulty bearing weight and uncontrolled pain this morning.  Patient denies any fever, chills, dysuria, hematuria, melena, hematochezia, chest pain, shortness of breath, headaches, blurred vision, focal weakness or any other complaints.  In the ED imaging studies demonstrated nondisplaced right intertrochanteric femur fracture; no signs of acute infection and just mild elevation of WBCs at follow-up abnormalities on her blood work.  EKG without any ischemic changes.  Orthopedic service has been contacted and planning to perform surgical repair on 04/25/2019. TR[facility] consulted for admission.   Assessment & Plan: 1-right hip fracture -planning ORIF later today -will follow orthopedic service rec's -continue PRN analgesics  2-GERD -continue PPI  3-COPD -continue PRN albuterol -no wheezing  4-tobacco abuse -continue nicotine patch -cessation counseling provided  5-depression/anxiety -continue remeron, Seroquel, Trazodone and PRN klonopin -stable mood on exam  6-elevated WBC's -due to stress demargination -resolved after IVF's  7-HLD -continue statins   8-seizure disorder -continue phenytoin   DVT prophylaxis: Heparin  Code Status: Full  Family Communication: no family at bedside  Disposition Plan: to be determine. Remains inpatient for hip surgery later today. Follow evaluation and recommendations by PT after surgery.  Consultants:   Orthopedic service.   Procedures:    Anticipated right hip ORIF later today.  Antimicrobials:  Anti-infectives (From admission, onward)   Start     Dose/Rate Route Frequency Ordered Stop   04/25/19 1600  ceFAZolin (ANCEF) IVPB 2g/100 mL premix     2 g 200 mL/hr over 30 Minutes Intravenous Every 6 hours 04/25/19 1554 04/26/19 0359   04/25/19 0600  ceFAZolin (ANCEF) IVPB 2g/100 mL premix  Status:  Discontinued     2 g 200 mL/hr over 30 Minutes Intravenous On call to O.R. 04/24/19 1320 04/25/19 1351       Subjective: Afebrile, no CP, no SOB, ready for his surgery   Objective: Vitals:   04/25/19 1300 04/25/19 1315 04/25/19 1330 04/25/19 1357  BP: 138/67 (!) 147/69 (!) 144/65 (!) 148/74  Pulse: 77 74 74 75  Resp: 18 (!) 36 15 16  Temp:    99.1 F (37.3 C)  TempSrc:    Oral  SpO2: 99% 98% 99% 96%  Weight:      Height:        Intake/Output Summary (Last 24 hours) at 04/25/2019 1722 Last data filed at 04/25/2019 1700 Gross per 24 hour  Intake 850.64 ml  Output 200 ml  Net 650.64 ml   Filed Weights   04/24/19 1356  Weight: 53.8 kg    Examination: General exam: Alert, awake, oriented x 3; still reporting pain in right hip with leg movement. No CP, no SOB. Respiratory system: positive rhonchi. Respiratory effort normal. Cardiovascular system:RRR. No murmurs, rubs, gallops. Gastrointestinal system: Abdomen is nondistended, soft and nontender. No organomegaly or masses felt. Normal bowel sounds heard. Central nervous system: Alert and oriented. No focal neurological deficits. Extremities: No Cyanosis, no clubbing  Skin: No rashes, lesions or ulcers Psychiatry: Judgement and insight appear normal. Mood & affect appropriate.  Data Reviewed: I have personally reviewed following labs and imaging studies  CBC: Recent Labs  Lab 04/24/19 0911 04/25/19 0617  WBC 13.6* 8.5  HGB 12.6 12.0  HCT 37.2 36.4  MCV 93.0 94.5  PLT 258 016   Basic Metabolic Panel: Recent Labs  Lab 04/24/19 0911 04/25/19 0617   NA 139 137  K 3.8 3.6  CL 104 102  CO2 25 25  GLUCOSE 137* 101*  BUN 11 12  CREATININE 0.74 0.72  CALCIUM 9.3 8.5*   GFR: Estimated Creatinine Clearance: 54.9 mL/min (by C-G formula based on SCr of 0.72 mg/dL).  Cardiac Enzymes: Recent Labs  Lab 04/24/19 0911  CKTOTAL 171   Urine analysis:    Component Value Date/Time   COLORURINE STRAW (A) 05/21/2017 1847   APPEARANCEUR CLEAR 05/21/2017 1847   LABSPEC 1.013 05/21/2017 1847   PHURINE 8.0 05/21/2017 1847   GLUCOSEU 50 (A) 05/21/2017 1847   HGBUR SMALL (A) 05/21/2017 1847   BILIRUBINUR NEGATIVE 05/21/2017 1847   KETONESUR NEGATIVE 05/21/2017 1847   PROTEINUR NEGATIVE 05/21/2017 1847   UROBILINOGEN 0.2 11/12/2012 1530   NITRITE NEGATIVE 05/21/2017 1847   LEUKOCYTESUR NEGATIVE 05/21/2017 1847    Recent Results (from the past 240 hour(s))  SARS Coronavirus 2 (CEPHEID - Performed in [place] hospital lab), Hosp Order     Status: None   Collection Time: 04/24/19 11:15 AM   Specimen: Nasopharyngeal Swab  Result Value Ref Range Status   SARS Coronavirus 2 NEGATIVE NEGATIVE Final    Comment: (NOTE) If result is NEGATIVE SARS-CoV-2 target nucleic acids are NOT DETECTED. The SARS-CoV-2 RNA is generally detectable in upper and lower  respiratory specimens during the acute phase of infection. The lowest  concentration of SARS-CoV-2 viral copies this assay can detect is 250  copies / mL. A negative result does not preclude SARS-CoV-2 infection  and should not be used as the sole basis for treatment or other  patient management decisions.  A negative result may occur with  improper specimen collection / handling, submission of specimen other  than nasopharyngeal swab, presence of viral mutation(s) within the  areas targeted by this assay, and inadequate number of viral copies  (<250 copies / mL). A negative result must be combined with clinical  observations, patient history, and epidemiological information. If result is  POSITIVE SARS-CoV-2 target nucleic acids are DETECTED. The SARS-CoV-2 RNA is generally detectable in upper and lower  respiratory specimens dur ing the acute phase of infection.  Positive  results are indicative of active infection with SARS-CoV-2.  Clinical  correlation with patient history and other diagnostic information is  necessary to determine patient infection status.  Positive results do  not rule out bacterial infection or co-infection with other viruses. If result is PRESUMPTIVE POSTIVE SARS-CoV-2 nucleic acids MAY BE PRESENT.   A presumptive positive result was obtained on the submitted specimen  and confirmed on repeat testing.  While 2019 novel coronavirus  (SARS-CoV-2) nucleic acids may be present in the submitted sample  additional confirmatory testing may be necessary for epidemiological  and / or clinical management purposes  to differentiate between  SARS-CoV-2 and other Sarbecovirus currently known to infect humans.  If clinically indicated additional testing with an alternate test  methodology [phone]) is advised. The SARS-CoV-2 RNA is generally  detectable in upper and lower respiratory sp ecimens during the acute  phase of infection. The expected result is Negative. Fact Sheet for Patients:  StrictlyIdeas.no Fact Sheet for Healthcare Providers: BankingDealers.co.za This  test is not yet approved or cleared by the [place] and has been authorized for detection and/or diagnosis of SARS-CoV-2 by FDA under an Emergency Use Authorization (EUA).  This EUA will remain in effect (meaning this test can be used) for the duration of the COVID-19 declaration under Section 564(b)(1) of the Act, 21 U.S.C. section 360bbb-3(b)(1), unless the authorization is terminated or revoked sooner. Performed at [facility], [address] [place], [place] [address]   Surgical PCR screen     Status: None   Collection Time:  04/24/19  4:50 PM   Specimen: Nasal Mucosa; Nasal Swab  Result Value Ref Range Status   MRSA, PCR NEGATIVE NEGATIVE Final   Staphylococcus aureus NEGATIVE NEGATIVE Final    Comment: (NOTE) The Xpert SA Assay (FDA approved for NASAL specimens in patients 76 years of age and older), is one component of a comprehensive surveillance program. It is not intended to diagnose infection nor to guide or monitor treatment. Performed at [facility], [address], [place], [place] [address]      Radiology Studies: Dg Pelvis 1-2 Views  Result Date: 04/24/2019 CLINICAL DATA:  Pt fell in her home today. Pt c/o severe pain in Rt hip with intermittent radiation of pain into Rt lower extremity. Pt unable to tolerate positioning for additional lateral of knee at this time EXAM: PELVIS - 1-2 VIEW COMPARISON:  CT 05/21/2017 FINDINGS: Minimally distracted right intertrochanteric femur fracture. No dislocation. Bony pelvis intact. IMPRESSION: Right intertrochanteric femur fracture. Electronically Signed   By: Lucrezia Europe M.D.   On: 04/24/2019 10:30   Dg Knee Complete 4 Views Right  Result Date: 04/24/2019 CLINICAL DATA:  Pt fell in her home today. Pt c/o severe pain in Rt hip with intermittent radiation of pain into Rt lower extremity. Pt unable to tolerate positioning for additional lateral of knee at this time EXAM: RIGHT KNEE - COMPLETE 4+ VIEW COMPARISON:  None. FINDINGS: Osteopenia. Small marginal spurs about the medial and lateral compartments of the knee. No fracture or dislocation. No definite effusion. IMPRESSION: 1. Negative for fracture or other acute bone abnormality. 2. Early  degenerative spurring. Electronically Signed   By: Lucrezia Europe M.D.   On: 04/24/2019 10:29   Dg Hip Operative Unilat With Pelvis Right  Result Date: 04/25/2019 CLINICAL DATA:  68 year old female undergoing right hip ORIF EXAM: OPERATIVE RIGHT HIP (WITH PELVIS IF PERFORMED) 9 VIEWS TECHNIQUE: Fluoroscopic spot image(s) were  submitted for interpretation post-operatively. COMPARISON:  Preoperative radiographs 04/24/2019 FINDINGS: A total of 9 intraoperative saved images were obtained during open reduction and internal fixation of the right intertrochanteric femoral neck fracture. The images demonstrate placement of a dynamic hip screw with lateral plate component. Alignment is anatomic. No evidence of immediate hardware complication. IMPRESSION: ORIF of right intertrochanteric femoral fracture without evidence of immediate complication. Electronically Signed   By: Jacqulynn Cadet M.D.   On: 04/25/2019 14:40   Dg Femur Min 2 Views Right  Result Date: 04/24/2019 CLINICAL DATA:  Pt fell in her home today. Pt c/o severe pain in Rt hip with intermittent radiation of pain into Rt lower extremity. Pt unable to tolerate positioning for additional lateral of knee at this time EXAM: RIGHT FEMUR 2 VIEWS COMPARISON:  None. FINDINGS: Minimally distracted intertrochanteric fracture. No dislocation. Distal femoral shaft intact, mildly osteopenic. IMPRESSION: Minimally distracted right intertrochanteric femur fracture Electronically Signed   By: Lucrezia Europe M.D.   On: 04/24/2019 10:28    Scheduled Meds: . Derrill Memo  ON 04/26/2019] aspirin EC  325 mg Oral Q breakfast  . atorvastatin  20 mg Oral Daily  . dicyclomine  10 mg Oral BID  . docusate sodium  100 mg Oral BID  . feeding supplement (ENSURE ENLIVE)  237 mL Oral BID BM  . mirtazapine  30 mg Oral QHS  . multivitamin with minerals  1 tablet Oral Daily  . nicotine  14 mg Transdermal Daily  . pantoprazole  40 mg Oral Daily  . phenytoin  400 mg Oral QHS  . QUEtiapine  25 mg Oral TID  . traMADol  50 mg Oral Q6H  . traZODone  100 mg Oral QPM   Continuous Infusions: . sodium chloride 10 mL/hr at 04/25/19 1700  . acetaminophen    .  ceFAZolin (ANCEF) IV Stopped (04/25/19 1652)  . methocarbamol (ROBAXIN) IV       LOS: 1 day    Time spent: 30 minutes.     Barton Dubois, MD  [facility] Pager [phone]  04/25/2019, 5:22 PM

## 2019-04-25 NOTE — Op Note (Signed)
04/25/2019  12:36 PM  PATIENT:  [name]  68 y.o. female  PRE-OPERATIVE DIAGNOSIS:  right hip fracture intertrochanteric  POST-OPERATIVE DIAGNOSIS:  right hip fracture intertrochanteric  PROCEDURE:  Procedure(s) with comments: COMPRESSION HIP (Right) - 1030am  Implant Richards hip screw 2 hole 130 degree plate compression screw and 2 regular screws into the shaft  SURGEON:  Surgeon(s) and Role:    Carole Civil, MD - Primary  The patient was seen in the preoperative area and surgical site confirmed marked.  Patient taken to surgery for general anesthesia then placed on the fracture table.  Right leg was placed in traction left leg was placed in a padded well leg holder.  C-arm was brought in leg was manipulated in traction internal rotation which gave an excellent reduction.  The leg was then prepped and draped sterilely Timeout was completed  The incision was made at the tip of the trochanter extended distally subcutaneous tissue was divided.  The fascia was then divided in line with the skin incision exposing the vastus lateralis.  We used a muscle splitting incision we coagulated to vessels with electrocautery and clamps.  We did a subperiosteal dissection to expose the femur.  We use 130 degree angle divide and placed a guidepin with threads into the center of the femoral head with a good tip to apex distance.  We measured 164mm for the wire.  We placed a triple reamer at 100 mm then passed it over the guidewire.  We placed the lag screw in the plate and then secured the plate to bone with 3 5 drill bit and 245 screws.  We relaxed the traction and then placed a compression screw  Radiographs confirmed plate and screw position.  Wound was irrigated and closed in 3 layers with 0 Monocryl followed by 2-0 Monocryl Steri-Strips with benzoin  Postop plan Weightbearing status as tolerated X-rays in 6 weeks DVT prophylaxis for 28 days Wound: Dressing as needed  PHYSICIAN  ASSISTANT:   ASSISTANTS: none   ANESTHESIA: General EBL:  none   BLOOD ADMINISTERED:none  DRAINS: none   LOCAL MEDICATIONS USED:  MARCAINE with epinephrine 60 cc  SPECIMEN:  No Specimen  DISPOSITION OF SPECIMEN:  N/A  COUNTS:  YES  TOURNIQUET:   See anesthesia record   DICTATION: .Dragon Dictation  PLAN OF CARE: Patient will be taken back to the floor PATIENT DISPOSITION:  PACU - hemodynamically stable.   Delay start of Pharmacological VTE agent (>24hrs) due to surgical blood loss or risk of bleeding: not applicable

## 2019-04-25 NOTE — Consult Note (Addendum)
Reason for Consult: Fracture right hip Referring Physician: Dr. Barton Dubois  [name] is an 68 y.o. female.  HPI: 69 year old female fell at home evening of June 26 came in June 27 when she could not walk the next morning after her husband put her in bed  Complains of pain right hip nonradiating dull worse with movement and associated with inability to walk  Past Medical History:  Diagnosis Date  . Alcohol abuse, in remission   . Anxiety   . Bipolar disorder ([place])   . Colitis   . COPD (chronic obstructive pulmonary disease) ([place])    occasional home O2  . Gastric ulcer   . Hemorrhoids   . IBS (irritable bowel syndrome)   . Seizures ([place])    last seizure was about a year ago    Past Surgical History:  Procedure Laterality Date  . ABDOMINAL HYSTERECTOMY    . CHOLECYSTECTOMY    . EYE SURGERY      Family History  Problem Relation Age of Onset  . Dementia Father     Social History:  reports that she has been smoking cigarettes. She has a 32.00 pack-year smoking history. She has never used smokeless tobacco. She reports current drug use. Drug: Marijuana. She reports that she does not drink alcohol.  Allergies:  Allergies  Allergen Reactions  . Chantix [Varenicline] Hives  . Codeine Nausea And Vomiting  . Flu Virus Vaccine Nausea And Vomiting  . Lithium Nausea And Vomiting    Medications: I have reviewed the patient's current medications.  Results for orders placed or performed during the hospital encounter of 04/24/19 (from the past 48 hour(s))  CK     Status: None   Collection Time: 04/24/19  9:11 AM  Result Value Ref Range   Total CK 171 38 - 234 U/L    Comment: Performed at [facility], [address], [place], [place] [address]  CBC     Status: Abnormal   Collection Time: 04/24/19  9:11 AM  Result Value Ref Range   WBC 13.6 (H) 4.0 - 10.5 K/uL   RBC 4.00 3.87 - 5.11 MIL/uL   Hemoglobin 12.6 12.0 - 15.0 g/dL   HCT 37.2 36.0 - 46.0 %   MCV 93.0 80.0 -  100.0 fL   MCH 31.5 26.0 - 34.0 pg   MCHC 33.9 30.0 - 36.0 g/dL   RDW 12.7 11.5 - 15.5 %   Platelets 258 150 - 400 K/uL   nRBC 0.0 0.0 - 0.2 %    Comment: Performed at [facility], [address] [place], [place] [address]  Basic metabolic panel     Status: Abnormal   Collection Time: 04/24/19  9:11 AM  Result Value Ref Range   Sodium 139 135 - 145 mmol/L   Potassium 3.8 3.5 - 5.1 mmol/L   Chloride 104 98 - 111 mmol/L   CO2 25 22 - 32 mmol/L   Glucose, Bld 137 (H) 70 - 99 mg/dL   BUN 11 8 - 23 mg/dL   Creatinine, Ser 0.74 0.44 - 1.00 mg/dL   Calcium 9.3 8.9 - 10.3 mg/dL   GFR calc non Af Amer >60 >60 mL/min   GFR calc Af Amer >60 >60 mL/min   Anion gap 10 5 - 15    Comment: Performed at [facility], [address] [place], [place] [address]  SARS Coronavirus 2 (CEPHEID - Performed in [place] hospital lab), Hosp Order     Status: None  Collection Time: 04/24/19 11:15 AM   Specimen: Nasopharyngeal Swab  Result Value Ref Range   SARS Coronavirus 2 NEGATIVE NEGATIVE    Comment: (NOTE) If result is NEGATIVE SARS-CoV-2 target nucleic acids are NOT DETECTED. The SARS-CoV-2 RNA is generally detectable in upper and lower  respiratory specimens during the acute phase of infection. The lowest  concentration of SARS-CoV-2 viral copies this assay can detect is 250  copies / mL. A negative result does not preclude SARS-CoV-2 infection  and should not be used as the sole basis for treatment or other  patient management decisions.  A negative result may occur with  improper specimen collection / handling, submission of specimen other  than nasopharyngeal swab, presence of viral mutation(s) within the  areas targeted by this assay, and inadequate number of viral copies  (<250 copies / mL). A negative result must be combined with clinical  observations, patient history, and epidemiological information. If result is POSITIVE SARS-CoV-2 target nucleic acids are DETECTED. The SARS-CoV-2  RNA is generally detectable in upper and lower  respiratory specimens dur ing the acute phase of infection.  Positive  results are indicative of active infection with SARS-CoV-2.  Clinical  correlation with patient history and other diagnostic information is  necessary to determine patient infection status.  Positive results do  not rule out bacterial infection or co-infection with other viruses. If result is PRESUMPTIVE POSTIVE SARS-CoV-2 nucleic acids MAY BE PRESENT.   A presumptive positive result was obtained on the submitted specimen  and confirmed on repeat testing.  While 2019 novel coronavirus  (SARS-CoV-2) nucleic acids may be present in the submitted sample  additional confirmatory testing may be necessary for epidemiological  and / or clinical management purposes  to differentiate between  SARS-CoV-2 and other Sarbecovirus currently known to infect humans.  If clinically indicated additional testing with an alternate test  methodology [phone]) is advised. The SARS-CoV-2 RNA is generally  detectable in upper and lower respiratory sp ecimens during the acute  phase of infection. The expected result is Negative. Fact Sheet for Patients:  StrictlyIdeas.no Fact Sheet for Healthcare Providers: BankingDealers.co.za This test is not yet approved or cleared by the [place] FDA and has been authorized for detection and/or diagnosis of SARS-CoV-2 by FDA under an Emergency Use Authorization (EUA).  This EUA will remain in effect (meaning this test can be used) for the duration of the COVID-19 declaration under Section 564(b)(1) of the Act, 21 U.S.C. section 360bbb-3(b)(1), unless the authorization is terminated or revoked sooner. Performed at [facility], [address] [place],  [address]   HIV antibody (Routine Testing)     Status: None   Collection Time: 04/24/19 12:45 PM  Result Value Ref Range   HIV Screen 4th  Generation wRfx Non Reactive Non Reactive    Comment: (NOTE) Performed At: [facility] [place], [place] [id number] Rush Farmer MD [id number]   VITAMIN D 25 Hydroxy (Vit-D Deficiency, Fractures)     Status: None   Collection Time: 04/24/19 12:45 PM  Result Value Ref Range   Vit D, 25-Hydroxy 31.9 30.0 - 100.0 ng/mL    Comment: (NOTE) Vitamin D deficiency has been defined by the Institute of Medicine and an Endocrine Society practice guideline as a level of serum 25-OH vitamin D less than 20 ng/mL (1,2). The Endocrine Society went on to further define vitamin D insufficiency as a level between 21 and 29 ng/mL (2). 1. IOM (Institute of Medicine). 2010. Dietary reference  intakes for calcium and D. [place]: The   [employer]. 2. Holick MF, B[place]ley [place], Bischoff-Ferrari HA, et al.   Evaluation, treatment, and prevention of vitamin D   deficiency: an Endocrine Society clinical practice   guideline. JCEM. 2011 Jul; 96(7):1911-30. Performed At: [facility] [place], [place] [id number] Rush Farmer MD [id number]   Surgical PCR screen     Status: None   Collection Time: 04/24/19  4:50 PM   Specimen: Nasal Mucosa; Nasal Swab  Result Value Ref Range   MRSA, PCR NEGATIVE NEGATIVE   Staphylococcus aureus NEGATIVE NEGATIVE    Comment: (NOTE) The Xpert SA Assay (FDA approved for NASAL specimens in patients 66 years of age and older), is one component of a comprehensive surveillance program. It is not intended to diagnose infection nor to guide or monitor treatment. Performed at [facility] [place], [address] [place], [place] [address]   CBC     Status: Abnormal   Collection Time: 04/25/19  6:17 AM  Result Value Ref Range   WBC 8.5 4.0 - 10.5 K/uL   RBC 3.85 (L) 3.87 - 5.11 MIL/uL   Hemoglobin 12.0 12.0 - 15.0 g/dL   HCT 36.4 36.0 - 46.0 %   MCV 94.5 80.0 - 100.0 fL   MCH 31.2 26.0 - 34.0 pg   MCHC 33.0 30.0 -  36.0 g/dL   RDW 12.9 11.5 - 15.5 %   Platelets 215 150 - 400 K/uL   nRBC 0.0 0.0 - 0.2 %    Comment: Performed at [facility], [address] [place], [place] [address]  Basic metabolic panel     Status: Abnormal   Collection Time: 04/25/19  6:17 AM  Result Value Ref Range   Sodium 137 135 - 145 mmol/L   Potassium 3.6 3.5 - 5.1 mmol/L   Chloride 102 98 - 111 mmol/L   CO2 25 22 - 32 mmol/L   Glucose, Bld 101 (H) 70 - 99 mg/dL   BUN 12 8 - 23 mg/dL   Creatinine, Ser 0.72 0.44 - 1.00 mg/dL   Calcium 8.5 (L) 8.9 - 10.3 mg/dL   GFR calc non Af Amer >60 >60 mL/min   GFR calc Af Amer >60 >60 mL/min   Anion gap 10 5 - 15    Comment: Performed at [facility], [address] [place], [place] [address]  Prepare RBC     Status: None   Collection Time: 04/25/19  7:40 AM  Result Value Ref Range   Order Confirmation      ORDER PROCESSED BY BLOOD BANK Performed at [facility], [address], [place], [place] [address]   Type and screen [facility]     Status: None (Preliminary result)   Collection Time: 04/25/19  8:05 AM  Result Value Ref Range   ABO/RH(D) O POS    Antibody Screen NEG    Sample Expiration 04/28/2019,2359    Unit Number [id number]    Blood Component Type RED CELLS,LR    Unit division 00    Status of Unit ALLOCATED    Transfusion Status OK TO TRANSFUSE    Crossmatch Result      Compatible Performed at [facility], [address] [place], [place] [address]    Unit Number [id number]    Blood Component Type RED CELLS,LR    Unit division 00    Status of Unit ALLOCATED    Transfusion Status OK TO TRANSFUSE    Crossmatch Result  Compatible   ABO/Rh     Status: None   Collection Time: 04/25/19  8:05 AM  Result Value Ref Range   ABO/RH(D)      O POS Performed at [facility], [address] [place], [place] [address]     Dg Pelvis 1-2 Views  Result Date: 04/24/2019 CLINICAL DATA:  Pt fell in her home today. Pt c/o severe pain in Rt hip with  intermittent radiation of pain into Rt lower extremity. Pt unable to tolerate positioning for additional lateral of knee at this time EXAM: PELVIS - 1-2 VIEW COMPARISON:  CT 05/21/2017 FINDINGS: Minimally distracted right intertrochanteric femur fracture. No dislocation. Bony pelvis intact. IMPRESSION: Right intertrochanteric femur fracture. Electronically Signed   By: Lucrezia Europe M.D.   On: 04/24/2019 10:30   Dg Knee Complete 4 Views Right  Result Date: 04/24/2019 CLINICAL DATA:  Pt fell in her home today. Pt c/o severe pain in Rt hip with intermittent radiation of pain into Rt lower extremity. Pt unable to tolerate positioning for additional lateral of knee at this time EXAM: RIGHT KNEE - COMPLETE 4+ VIEW COMPARISON:  None. FINDINGS: Osteopenia. Small marginal spurs about the medial and lateral compartments of the knee. No fracture or dislocation. No definite effusion. IMPRESSION: 1. Negative for fracture or other acute bone abnormality. 2. Early  degenerative spurring. Electronically Signed   By: Lucrezia Europe M.D.   On: 04/24/2019 10:29   Dg Femur Min 2 Views Right  Result Date: 04/24/2019 CLINICAL DATA:  Pt fell in her home today. Pt c/o severe pain in Rt hip with intermittent radiation of pain into Rt lower extremity. Pt unable to tolerate positioning for additional lateral of knee at this time EXAM: RIGHT FEMUR 2 VIEWS COMPARISON:  None. FINDINGS: Minimally distracted intertrochanteric fracture. No dislocation. Distal femoral shaft intact, mildly osteopenic. IMPRESSION: Minimally distracted right intertrochanteric femur fracture Electronically Signed   By: Lucrezia Europe M.D.   On: 04/24/2019 10:28    Review of Systems  Constitutional: Negative for fever.  Respiratory: Negative for shortness of breath.   Cardiovascular: Negative for chest pain.  Skin: Negative.   Neurological: Negative for tingling and sensory change.  All other systems reviewed and are negative.  Blood pressure 138/69, pulse 79,  temperature 98.9 F (37.2 C), temperature source Oral, resp. rate 18, height 5\' 3"  (1.6 m), weight 53.8 kg, SpO2 92 %. Physical Exam  Constitutional: She is oriented to person, place, and time. She appears well-developed and well-nourished. No distress.  Eyes: Pupils are equal, round, and reactive to light. Conjunctivae and EOM are normal. No scleral icterus.  Neck: Normal range of motion. Neck supple. No JVD present. No tracheal deviation present. No thyromegaly present.  Cardiovascular: Normal rate.  Respiratory: Effort normal and breath sounds normal.  Musculoskeletal:     Comments: Left arm right arm: Normal alignment, no joint contractures, joint subluxations none, muscle tone normal neurovascular exam intact skin clean  Left leg: Normal alignment, no joint contractures, joint subluxations none, muscle tone normal neurovascular exam intact skin clean  Right leg is shortened externally rotated tender in the groin tender over the greater trochanter proximal thigh knee and ankle nontender no muscle abnormalities in terms of muscle tone neurovascular exam intact skin normal   Lymphadenopathy:    She has no cervical adenopathy.  Neurological: She is alert and oriented to person, place, and time.  Skin: Skin is warm and dry. No rash noted. She is not diaphoretic. No erythema. No pallor.  Psychiatric: She has a normal mood and affect. Her behavior is normal. Judgment and thought content normal.    Assessment/Plan: Fracture right hip intertrochanteric 2 part stable The procedure has been fully reviewed with the patient; The risks and benefits of surgery have been discussed and explained and understood. Alternative treatment has also been reviewed, questions were encouraged and answered. The postoperative plan is also been reviewed.  Open treatment internal fixation right hip  Arther Abbott 04/25/2019,

## 2019-04-25 NOTE — Anesthesia Postprocedure Evaluation (Signed)
Anesthesia Post Note  Patient: [name]  Procedure(s) Performed: COMPRESSION HIP (Right )  Patient location during evaluation: PACU Anesthesia Type: General Level of consciousness: awake, oriented and patient cooperative Pain management: pain level controlled Vital Signs Assessment: post-procedure vital signs reviewed and stable Respiratory status: spontaneous breathing, nonlabored ventilation and patient connected to nasal cannula oxygen Cardiovascular status: blood pressure returned to baseline and stable Postop Assessment: no headache and no apparent nausea or vomiting Anesthetic complications: no     Last Vitals:  Vitals:   04/25/19 0453 04/25/19 0732  BP: 122/80 138/69  Pulse: 90 79  Resp: 18 18  Temp: 37.1 C 37.2 C  SpO2: 91% 92%    Last Pain:  Vitals:   04/25/19 0755  TempSrc:   PainSc: 10-Worst pain ever                 Vena Rua

## 2019-04-25 NOTE — Brief Op Note (Signed)
04/24/2019 - 04/25/2019  12:36 PM  PATIENT:  [name]  68 y.o. female  PRE-OPERATIVE DIAGNOSIS:  right hip fracture intertrochanteric  POST-OPERATIVE DIAGNOSIS:  right hip fracture intertrochanteric  PROCEDURE:  Procedure(s) with comments: COMPRESSION HIP (Right) - 1030am  Implant Richards hip screw 2 hole 130 degree plate compression screw and 2 regular screws into the shaft  SURGEON:  Surgeon(s) and Role:    Carole Civil, MD - Primary  PHYSICIAN ASSISTANT:   ASSISTANTS: none   ANESTHESIA: General EBL:  none   BLOOD ADMINISTERED:none  DRAINS: none   LOCAL MEDICATIONS USED:  MARCAINE with epinephrine 60 cc  SPECIMEN:  No Specimen  DISPOSITION OF SPECIMEN:  N/A  COUNTS:  YES  TOURNIQUET:   See anesthesia record   DICTATION: .Dragon Dictation  PLAN OF CARE: Patient will be taken back to the floor PATIENT DISPOSITION:  PACU - hemodynamically stable.   Delay start of Pharmacological VTE agent (>24hrs) due to surgical blood loss or risk of bleeding: not applicable

## 2019-04-25 NOTE — Anesthesia Preprocedure Evaluation (Addendum)
Anesthesia Evaluation    Airway Mallampati: III       Dental  (+) Edentulous Upper, Edentulous Lower   Pulmonary shortness of breath, with exertion and Long-Term Oxygen Therapy, COPD,  COPD inhaler and oxygen dependent, Current Smoker,    + rhonchi  + decreased breath sounds      Cardiovascular  Rhythm:regular     Neuro/Psych Seizures -,  PSYCHIATRIC DISORDERS Anxiety Bipolar Disorder    GI/Hepatic PUD, GERD  ,  Endo/Other    Renal/GU      Musculoskeletal   Abdominal   Peds  Hematology   Anesthesia Other Findings COPD with ongoing tobacco abuse, home O2 H/O ETOH abuse, possibly in remission, Sz hx  Sz preop temp 992 Non-productive smokers cough, unable to expectorate 2" splinting hip pain Room air SpO2 88-90% Possible extended postop intubation d/w pt  Reproductive/Obstetrics                           Anesthesia Physical Anesthesia Plan  ASA: IV  Anesthesia Plan: General   Post-op Pain Management:    Induction:   PONV Risk Score and Plan:   Airway Management Planned:   Additional Equipment:   Intra-op Plan:   Post-operative Plan:   Informed Consent: I have reviewed the patients History and Physical, chart, labs and discussed the procedure including the risks, benefits and alternatives for the proposed anesthesia with the patient or authorized representative who has indicated his/her understanding and acceptance.       Plan Discussed with: Anesthesiologist  Anesthesia Plan Comments:         Anesthesia Quick Evaluation

## 2019-04-26 DIAGNOSIS — Z8781 Personal history of (healed) traumatic fracture: Secondary | ICD-10-CM

## 2019-04-26 DIAGNOSIS — D62 Acute posthemorrhagic anemia: Secondary | ICD-10-CM

## 2019-04-26 LAB — CBC
HCT: 30.1 % — ABNORMAL LOW (ref 36.0–46.0)
Hemoglobin: 9.8 g/dL — ABNORMAL LOW (ref 12.0–15.0)
MCH: 30.7 pg (ref 26.0–34.0)
MCHC: 32.6 g/dL (ref 30.0–36.0)
MCV: 94.4 fL (ref 80.0–100.0)
Platelets: 201 10*3/uL (ref 150–400)
RBC: 3.19 MIL/uL — ABNORMAL LOW (ref 3.87–5.11)
RDW: 12.6 % (ref 11.5–15.5)
WBC: 10.6 10*3/uL — ABNORMAL HIGH (ref 4.0–10.5)
nRBC: 0 % (ref 0.0–0.2)

## 2019-04-26 LAB — BASIC METABOLIC PANEL
Anion gap: 7 (ref 5–15)
BUN: 11 mg/dL (ref 8–23)
CO2: 25 mmol/L (ref 22–32)
Calcium: 8 mg/dL — ABNORMAL LOW (ref 8.9–10.3)
Chloride: 102 mmol/L (ref 98–111)
Creatinine, Ser: 0.77 mg/dL (ref 0.44–1.00)
GFR calc Af Amer: >60 mL/min (ref >60)
GFR calc non Af Amer: >60 mL/min (ref >60)
Glucose, Bld: 137 mg/dL — ABNORMAL HIGH (ref 70–99)
Potassium: 3.4 mmol/L — ABNORMAL LOW (ref 3.5–5.1)
Sodium: 134 mmol/L — ABNORMAL LOW (ref 135–145)

## 2019-04-26 MED ORDER — POLYSACCHARIDE IRON COMPLEX 150 MG PO CAPS: 150.0000 mg | ORAL_CAPSULE | Freq: Every day | ORAL | 1 refills | Status: DC

## 2019-04-26 MED ORDER — CLONAZEPAM 1 MG PO TABS: 1.0000 mg | ORAL_TABLET | Freq: Three times a day (TID) | ORAL | Status: DC

## 2019-04-26 MED ORDER — POLYSACCHARIDE IRON COMPLEX 150 MG PO CAPS
150.0000 mg | ORAL_CAPSULE | Freq: Every day | ORAL | Status: DC
  Administered 2019-04-26: 13:00:00 150 mg via ORAL
  Filled 2019-04-26: qty 1

## 2019-04-26 MED ORDER — DOCUSATE SODIUM 100 MG PO CAPS: 100.0000 mg | ORAL_CAPSULE | Freq: Two times a day (BID) | ORAL | 0 refills | Status: AC

## 2019-04-26 MED ORDER — HYDROCODONE-ACETAMINOPHEN 10-325 MG PO TABS: 1.0000 | ORAL_TABLET | Freq: Four times a day (QID) | ORAL | 0 refills | Status: AC | PRN

## 2019-04-26 MED ORDER — POLYETHYLENE GLYCOL 3350 17 G PO PACK: 17.0000 g | PACK | Freq: Every day | ORAL | 0 refills | Status: DC | PRN

## 2019-04-26 MED ORDER — ASPIRIN 325 MG PO TBEC: 325.0000 mg | DELAYED_RELEASE_TABLET | Freq: Every day | ORAL | 0 refills | Status: DC

## 2019-04-26 MED ORDER — NICOTINE 14 MG/24HR TD PT24: 14.0000 mg | MEDICATED_PATCH | Freq: Every day | TRANSDERMAL | 0 refills | Status: DC

## 2019-04-26 MED ORDER — TRAMADOL HCL 50 MG PO TABS: 50.0000 mg | ORAL_TABLET | Freq: Four times a day (QID) | ORAL | 0 refills | Status: AC | PRN

## 2019-04-26 MED ORDER — ZOLPIDEM TARTRATE 10 MG PO TABS: 10.0000 mg | ORAL_TABLET | Freq: Every evening | ORAL | Status: DC | PRN

## 2019-04-26 MED ORDER — QUETIAPINE FUMARATE 25 MG PO TABS: 25.0000 mg | ORAL_TABLET | Freq: Three times a day (TID) | ORAL | 0 refills | Status: DC

## 2019-04-26 MED ORDER — MIRTAZAPINE 30 MG PO TABS: 30.0000 mg | ORAL_TABLET | Freq: Every day | ORAL | 1 refills | Status: DC

## 2019-04-26 NOTE — Progress Notes (Signed)
Nsg Discharge Note  Admit Date:  04/24/2019 Discharge date: 04/26/2019   [name] to be D/C'd Home per MD order.  AVS completed.  Copy for chart, and copy for patient signed, and dated. Patient/caregiver able to verbalize understanding.  Discharge Medication: Allergies as of 04/26/2019      Reactions   Chantix [varenicline] Hives   Codeine Nausea And Vomiting   Flu Virus Vaccine Nausea And Vomiting   Lithium Nausea And Vomiting      Medication List    TAKE these medications   albuterol 108 (90 Base) MCG/ACT inhaler Commonly known as: VENTOLIN HFA Inhale 2 puffs into the lungs every 6 (six) hours as needed for wheezing or shortness of breath.   albuterol (2.5 MG/3ML) 0.083% nebulizer solution Commonly known as: PROVENTIL Take 2.5 mg by nebulization every 6 (six) hours as needed for wheezing or shortness of breath.   aspirin 325 MG EC tablet Take 1 tablet (325 mg total) by mouth daily with breakfast. Start taking on: April 27, 2019   atorvastatin 20 MG tablet Commonly known as: LIPITOR Take 1 tablet by mouth every evening.   cholecalciferol 25 MCG (1000 UT) tablet Commonly known as: VITAMIN D3 Take 1,000 Units by mouth daily.   clonazePAM 1 MG tablet Commonly known as: KlonoPIN Take 1 tablet (1 mg total) by mouth 3 (three) times daily.   dicyclomine 10 MG capsule Commonly known as: BENTYL Take 10 mg by mouth 2 (two) times daily.   docusate sodium 100 MG capsule Commonly known as: COLACE Take 1 capsule (100 mg total) by mouth 2 (two) times daily for 30 days.   HYDROcodone-acetaminophen 10-325 MG tablet Commonly known as: NORCO Take 1 tablet by mouth every 6 (six) hours as needed for up to 7 days for severe pain. *Prescribed one tablet every 4 hours as needed for pain What changed:   when to take this  reasons to take this   iron polysaccharides 150 MG capsule Commonly known as: NIFEREX Take 1 capsule (150 mg total) by mouth daily.   mirtazapine 30 MG  tablet Commonly known as: REMERON Take 1 tablet (30 mg total) by mouth at bedtime.   nicotine 14 mg/24hr patch Commonly known as: NICODERM CQ - dosed in mg/24 hours Place 1 patch (14 mg total) onto the skin daily. Start taking on: April 27, 2019   omeprazole 20 MG capsule Commonly known as: PRILOSEC Take 1 capsule by mouth daily.   ondansetron 4 MG tablet Commonly known as: ZOFRAN Take 4 mg by mouth every 8 (eight) hours as needed for nausea.   phenytoin 100 MG ER capsule Commonly known as: DILANTIN Take 400 mg by mouth at bedtime.   polyethylene glycol 17 g packet Commonly known as: MiraLax Take 17 g by mouth daily as needed for mild constipation.   QUEtiapine 25 MG tablet Commonly known as: SEROQUEL Take 1 tablet (25 mg total) by mouth 3 (three) times daily.   tiZANidine 4 MG tablet Commonly known as: ZANAFLEX Take 4 mg by mouth every 8 (eight) hours as needed for muscle spasms.   traMADol 50 MG tablet Commonly known as: ULTRAM Take 1 tablet (50 mg total) by mouth every 6 (six) hours as needed for up to 7 days for moderate pain.   traZODone 100 MG tablet Commonly known as: DESYREL Take 1 tablet by mouth every evening.   zolpidem 10 MG tablet Commonly known as: AMBIEN Take 1 tablet (10 mg total) by mouth at bedtime as  needed for sleep. What changed:   when to take this  reasons to take this            Durable Medical Equipment  (From admission, onward)         Start     Ordered   04/26/19 1242  For home use only DME Walker rolling  Once    Question:  Patient needs a walker to treat with the following condition  Answer:  Physical deconditioning   04/26/19 1242          Discharge Assessment: Vitals:   04/26/19 0414 04/26/19 0807  BP: 103/71   Pulse: 80   Resp:    Temp: 99.1 F (37.3 C)   SpO2: 98% 97%   Skin clean, dry and intact without evidence of skin break down, no evidence of skin tears noted. IV catheter discontinued intact. Site  without signs and symptoms of complications - no redness or edema noted at insertion site, patient denies c/o pain - only slight tenderness at site.  Dressing with slight pressure applied.  D/c Instructions-Education: Discharge instructions given to patient/family with verbalized understanding. D/c education completed with patient/family including follow up instructions, medication list, d/c activities limitations if indicated, with other d/c instructions as indicated by MD - patient able to verbalize understanding, all questions fully answered. Patient instructed to return to ED, call 911, or call MD for any changes in condition.  Patient escorted via [name], and D/C home via private auto.  Loa Socks, RN 04/26/2019 2:39 PM

## 2019-04-26 NOTE — Plan of Care (Signed)
  Problem: Acute Rehab PT Goals(only PT should resolve) Goal: Pt Will Go Supine/Side To Sit Outcome: Progressing Flowsheets (Taken 04/26/2019 1110) Pt will go Supine/Side to Sit: with min guard assist Goal: Patient Will Transfer Sit To/From Stand Outcome: Progressing Flowsheets (Taken 04/26/2019 1110) Patient will transfer sit to/from stand: with min guard assist Goal: Pt Will Transfer Bed To Chair/Chair To Bed Outcome: Progressing Flowsheets (Taken 04/26/2019 1110) Pt will Transfer Bed to Chair/Chair to Bed: min guard assist Goal: Pt Will Ambulate Outcome: Progressing Flowsheets (Taken 04/26/2019 1110) Pt will Ambulate:  50 feet  with minimal assist  with rolling walker   11:11 AM, 04/26/19 Lonell Grandchild, MPT Physical Therapist with [facility] [phone] office [phone] mobile phone

## 2019-04-26 NOTE — Discharge Summary (Signed)
Physician Discharge Summary  [name]:[id number] DOB: 19-Dec-1950 DOA: 04/24/2019  PCP: Redmond School, MD  Admit date: 04/24/2019 Discharge date: 04/26/2019  Time spent: 35 minutes  Recommendations for Outpatient Follow-up:  1. Repeat CBC to follow hemoglobin trend 2. Repeat basic metabolic panel to follow electrolytes and renal function   Discharge Diagnoses:  Principal Problem:   Closed fracture of right hip ([place]) Active Problems:   GERD (gastroesophageal reflux disease)   Tobacco use disorder   COPD (chronic obstructive pulmonary disease) (HCC)   Leukocytosis   S/P right hip fracture   Acute blood loss as cause of postoperative anemia   Discharge Condition: Stable and improved.  Patient discharged home with home health services and instruction to follow-up with PCP and orthopedic service.  Diet recommendation: Heart healthy diet.  Filed Weights   04/24/19 1356  Weight: 53.8 kg    History of present illness:  68 y.o.femalewith a past medical history significant seizure disorder, bipolar disorder, tobacco abuse, COPD and gastroesophageal reflux disease; who presented to the hospital secondary to right hip pain and inability to bear weight. Patient experienced mechanical fall around 11 PM the night prior to admission, was assisted by her husband to go back on bed and then experienced difficulty bearing weight and uncontrolled pain this morning. Patient denies any fever, chills, dysuria, hematuria, melena, hematochezia, chest pain, shortness of breath, headaches, blurred vision, focal weakness or any other complaints.  In the ED imaging studies demonstrated nondisplaced right intertrochanteric femur fracture;no signs of acute infection and just mild elevation of WBCs at follow-up abnormalities on her blood work. EKG without any ischemic changes. Orthopedic service has been contacted and planning to perform surgical repair on 04/25/2019. [facility] consulted for  admission.  Hospital Course:  1-right hip fracture -Status post ORIF on 04/25/2019 -Physical therapy evaluation recommended skilled nursing facility for rehab; this has been declined by patient and home health services with family care has been arranged at discharge. -Patient will bear weight as tolerated -Hydrocodone and tramadol as needed for pain has been provided -Prescription for bowel regimen using Colace and MiraLAX given at discharge. -Aspirin full dose on daily basis for DVT prophylaxis as recommended by orthopedic service. -Patient will follow-up in about 4 weeks with Dr. Aline Brochure as an outpatient -Plan is to repeat x-rays in about 6 weeks. -Arrangements for rolling walker at discharge has been made to assist with ambulation. -Patient's condition and needs discussed with husband over the phone at time of discharge; he was in agreement for patient to go home and is anticipating be able to provide care for her.   2-GERD -continue PPI  3-COPD -continue PRN albuterol -no wheezing at discharge; good oxygen saturation on room air.  4-tobacco abuse -continue nicotine patch -cessation counseling provided  5-depression/anxiety -continue remeron, Seroquel, Trazodone and PRN klonopin -stable mood on exam -No suicidal ideation or hallucinations.  6-elevated WBC's -due to stress demargination -resolved after IVF's  7-HLD -continue statins   8-seizure disorder -continue phenytoin  -No seizure activity appreciated.  9-mild acute blood loss anemia postoperatively -No transfusions needed -Patient discharged on Niferex daily -Repeat CBC to follow hemoglobin trend.  Procedures:  See below for x-ray reports  Status post ORIF of right hip on 04/25/2019.  Consultations:  Orthopedic service  Discharge Exam: Vitals:   04/26/19 0414 04/26/19 0807  BP: 103/71   Pulse: 80   Resp:    Temp: 99.1 F (37.3 C)   SpO2: 98% 97%    General: Afebrile, no  chest pain, no  shortness of breath, no nausea, no vomiting.  Reports still some pain in her right hip especially when bearing weight. Cardiovascular: S1-S2, no rubs, no gallops, no JVD Respiratory: Good air movement bilaterally; positive rhonchi, no wheezing, no crackles. Abdomen: Soft, nontender, nondistended, positive bowel sounds Extremities: No edema, no cyanosis, no clubbing.  Clean dressings on right hip appreciated.  Discharge Instructions   Discharge Instructions    Diet - low sodium heart healthy   Complete by: As directed    Discharge instructions   Complete by: As directed    Take medications as prescribed Maintain adequate hydration Weightbearing as tolerated Follow instructions and recommendations by home health physical therapy Make sure to use rolling walker to assist with ambulation and prevent future falls. Stop smoking Follow-up with orthopedic surgeon as instructed.     Allergies as of 04/26/2019      Reactions   Chantix [varenicline] Hives   Codeine Nausea And Vomiting   Flu Virus Vaccine Nausea And Vomiting   Lithium Nausea And Vomiting      Medication List    TAKE these medications   albuterol 108 (90 Base) MCG/ACT inhaler Commonly known as: VENTOLIN HFA Inhale 2 puffs into the lungs every 6 (six) hours as needed for wheezing or shortness of breath.   albuterol (2.5 MG/3ML) 0.083% nebulizer solution Commonly known as: PROVENTIL Take 2.5 mg by nebulization every 6 (six) hours as needed for wheezing or shortness of breath.   aspirin 325 MG EC tablet Take 1 tablet (325 mg total) by mouth daily with breakfast. Start taking on: April 27, 2019   atorvastatin 20 MG tablet Commonly known as: LIPITOR Take 1 tablet by mouth every evening.   cholecalciferol 25 MCG (1000 UT) tablet Commonly known as: VITAMIN D3 Take 1,000 Units by mouth daily.   clonazePAM 1 MG tablet Commonly known as: KlonoPIN Take 1 tablet (1 mg total) by mouth 3 (three) times daily.    dicyclomine 10 MG capsule Commonly known as: BENTYL Take 10 mg by mouth 2 (two) times daily.   docusate sodium 100 MG capsule Commonly known as: COLACE Take 1 capsule (100 mg total) by mouth 2 (two) times daily for 30 days.   HYDROcodone-acetaminophen 10-325 MG tablet Commonly known as: NORCO Take 1 tablet by mouth every 6 (six) hours as needed for up to 7 days for severe pain. *Prescribed one tablet every 4 hours as needed for pain What changed:   when to take this  reasons to take this   iron polysaccharides 150 MG capsule Commonly known as: NIFEREX Take 1 capsule (150 mg total) by mouth daily.   mirtazapine 30 MG tablet Commonly known as: REMERON Take 1 tablet (30 mg total) by mouth at bedtime.   nicotine 14 mg/24hr patch Commonly known as: NICODERM CQ - dosed in mg/24 hours Place 1 patch (14 mg total) onto the skin daily. Start taking on: April 27, 2019   omeprazole 20 MG capsule Commonly known as: PRILOSEC Take 1 capsule by mouth daily.   ondansetron 4 MG tablet Commonly known as: ZOFRAN Take 4 mg by mouth every 8 (eight) hours as needed for nausea.   phenytoin 100 MG ER capsule Commonly known as: DILANTIN Take 400 mg by mouth at bedtime.   polyethylene glycol 17 g packet Commonly known as: MiraLax Take 17 g by mouth daily as needed for mild constipation.   QUEtiapine 25 MG tablet Commonly known as: SEROQUEL Take 1 tablet (25 mg total)  by mouth 3 (three) times daily.   tiZANidine 4 MG tablet Commonly known as: ZANAFLEX Take 4 mg by mouth every 8 (eight) hours as needed for muscle spasms.   traMADol 50 MG tablet Commonly known as: ULTRAM Take 1 tablet (50 mg total) by mouth every 6 (six) hours as needed for up to 7 days for moderate pain.   traZODone 100 MG tablet Commonly known as: DESYREL Take 1 tablet by mouth every evening.   zolpidem 10 MG tablet Commonly known as: AMBIEN Take 1 tablet (10 mg total) by mouth at bedtime as needed for  sleep. What changed:   when to take this  reasons to take this            Durable Medical Equipment  (From admission, onward)         Start     Ordered   04/26/19 1242  For home use only DME Walker rolling  Once    Question:  Patient needs a walker to treat with the following condition  Answer:  Physical deconditioning   04/26/19 1242         Allergies  Allergen Reactions  . Chantix [Varenicline] Hives  . Codeine Nausea And Vomiting  . Flu Virus Vaccine Nausea And Vomiting  . Lithium Nausea And Vomiting   Follow-up Information    Redmond School, MD. Schedule an appointment as soon as possible for a visit in 10 day(s).   Specialty: Internal Medicine Contact information: [address] [place] [address] [phone]        Carole Civil, MD. Schedule an appointment as soon as possible for a visit in 4 week(s).   Specialties: Orthopedic Surgery, Radiology Contact information: [address] [place] [address] [phone]           The results of significant diagnostics from this hospitalization (including imaging, microbiology, ancillary and laboratory) are listed below for reference.    Significant Diagnostic Studies: Dg Pelvis 1-2 Views  Result Date: 04/24/2019 CLINICAL DATA:  Pt fell in her home today. Pt c/o severe pain in Rt hip with intermittent radiation of pain into Rt lower extremity. Pt unable to tolerate positioning for additional lateral of knee at this time EXAM: PELVIS - 1-2 VIEW COMPARISON:  CT 05/21/2017 FINDINGS: Minimally distracted right intertrochanteric femur fracture. No dislocation. Bony pelvis intact. IMPRESSION: Right intertrochanteric femur fracture. Electronically Signed   By: Lucrezia Europe M.D.   On: 04/24/2019 10:30   Dg Knee Complete 4 Views Right  Result Date: 04/24/2019 CLINICAL DATA:  Pt fell in her home today. Pt c/o severe pain in Rt hip with intermittent radiation of pain into Rt lower extremity.  Pt unable to tolerate positioning for additional lateral of knee at this time EXAM: RIGHT KNEE - COMPLETE 4+ VIEW COMPARISON:  None. FINDINGS: Osteopenia. Small marginal spurs about the medial and lateral compartments of the knee. No fracture or dislocation. No definite effusion. IMPRESSION: 1. Negative for fracture or other acute bone abnormality. 2. Early  degenerative spurring. Electronically Signed   By: Lucrezia Europe M.D.   On: 04/24/2019 10:29   Dg Hip Operative Unilat With Pelvis Right  Result Date: 04/25/2019 CLINICAL DATA:  68 year old female undergoing right hip ORIF EXAM: OPERATIVE RIGHT HIP (WITH PELVIS IF PERFORMED) 9 VIEWS TECHNIQUE: Fluoroscopic spot image(s) were submitted for interpretation post-operatively. COMPARISON:  Preoperative radiographs 04/24/2019 FINDINGS: A total of 9 intraoperative saved images were obtained during open reduction and internal fixation of the right intertrochanteric femoral  neck fracture. The images demonstrate placement of a dynamic hip screw with lateral plate component. Alignment is anatomic. No evidence of immediate hardware complication. IMPRESSION: ORIF of right intertrochanteric femoral fracture without evidence of immediate complication. Electronically Signed   By: Jacqulynn Cadet M.D.   On: 04/25/2019 14:40   Dg Femur Min 2 Views Right  Result Date: 04/24/2019 CLINICAL DATA:  Pt fell in her home today. Pt c/o severe pain in Rt hip with intermittent radiation of pain into Rt lower extremity. Pt unable to tolerate positioning for additional lateral of knee at this time EXAM: RIGHT FEMUR 2 VIEWS COMPARISON:  None. FINDINGS: Minimally distracted intertrochanteric fracture. No dislocation. Distal femoral shaft intact, mildly osteopenic. IMPRESSION: Minimally distracted right intertrochanteric femur fracture Electronically Signed   By: Lucrezia Europe M.D.   On: 04/24/2019 10:28    Microbiology: Recent Results (from the past 240 hour(s))  SARS Coronavirus 2  (CEPHEID - Performed in [facility] hospital lab), Hosp Order     Status: None   Collection Time: 04/24/19 11:15 AM   Specimen: Nasopharyngeal Swab  Result Value Ref Range Status   SARS Coronavirus 2 NEGATIVE NEGATIVE Final    Comment: (NOTE) If result is NEGATIVE SARS-CoV-2 target nucleic acids are NOT DETECTED. The SARS-CoV-2 RNA is generally detectable in upper and lower  respiratory specimens during the acute phase of infection. The lowest  concentration of SARS-CoV-2 viral copies this assay can detect is 250  copies / mL. A negative result does not preclude SARS-CoV-2 infection  and should not be used as the sole basis for treatment or other  patient management decisions.  A negative result may occur with  improper specimen collection / handling, submission of specimen other  than nasopharyngeal swab, presence of viral mutation(s) within the  areas targeted by this assay, and inadequate number of viral copies  (<250 copies / mL). A negative result must be combined with clinical  observations, patient history, and epidemiological information. If result is POSITIVE SARS-CoV-2 target nucleic acids are DETECTED. The SARS-CoV-2 RNA is generally detectable in upper and lower  respiratory specimens dur ing the acute phase of infection.  Positive  results are indicative of active infection with SARS-CoV-2.  Clinical  correlation with patient history and other diagnostic information is  necessary to determine patient infection status.  Positive results do  not rule out bacterial infection or co-infection with other viruses. If result is PRESUMPTIVE POSTIVE SARS-CoV-2 nucleic acids MAY BE PRESENT.   A presumptive positive result was obtained on the submitted specimen  and confirmed on repeat testing.  While 2019 novel coronavirus  (SARS-CoV-2) nucleic acids may be present in the submitted sample  additional confirmatory testing may be necessary for epidemiological  and / or clinical  management purposes  to differentiate between  SARS-CoV-2 and other Sarbecovirus currently known to infect humans.  If clinically indicated additional testing with an alternate test  methodology [phone]) is advised. The SARS-CoV-2 RNA is generally  detectable in upper and lower respiratory sp ecimens during the acute  phase of infection. The expected result is Negative. Fact Sheet for Patients:  StrictlyIdeas.no Fact Sheet for Healthcare Providers: BankingDealers.co.za This test is not yet approved or cleared by the [place] FDA and has been authorized for detection and/or diagnosis of SARS-CoV-2 by FDA under an Emergency Use Authorization (EUA).  This EUA will remain in effect (meaning this test can be used) for the duration of the COVID-19 declaration under Section 564(b)(1) of the Act, 21  U.S.C. section 360bbb-3(b)(1), unless the authorization is terminated or revoked sooner. Performed at [facility], [address], [place], [place] [address]   Surgical PCR screen     Status: None   Collection Time: 04/24/19  4:50 PM   Specimen: Nasal Mucosa; Nasal Swab  Result Value Ref Range Status   MRSA, PCR NEGATIVE NEGATIVE Final   Staphylococcus aureus NEGATIVE NEGATIVE Final    Comment: (NOTE) The Xpert SA Assay (FDA approved for NASAL specimens in patients 11 years of age and older), is one component of a comprehensive surveillance program. It is not intended to diagnose infection nor to guide or monitor treatment. Performed at [facility], [address], [place], [place] [address]      Labs: Basic Metabolic Panel: Recent Labs  Lab 04/24/19 0911 04/25/19 0617 04/26/19 0638  NA 139 137 134*  K 3.8 3.6 3.4*  CL 104 102 102  CO2 25 25 25   GLUCOSE 137* 101* 137*  BUN 11 12 11   CREATININE 0.74 0.72 0.77  CALCIUM 9.3 8.5* 8.0*   CBC: Recent Labs  Lab 04/24/19 0911 04/25/19 0617 04/26/19 0638  WBC 13.6* 8.5 10.6*  HGB  12.6 12.0 9.8*  HCT 37.2 36.4 30.1*  MCV 93.0 94.5 94.4  PLT 258 215 201   Cardiac Enzymes: Recent Labs  Lab 04/24/19 0911  CKTOTAL 171    Signed:  Barton Dubois MD.  [facility] 04/26/2019, 1:02 PM

## 2019-04-26 NOTE — TOC Transition Note (Signed)
Transition of Care [facility]) - CM/SW Discharge Note   Patient Details  Name: [name] MRN: [id number] Date of Birth: [birth year]  [facility]) CM/SW Contact:  Latanya Maudlin, RN Phone Number: 04/26/2019, 2:03 PM   Clinical Narrative:  Patient to be discharged per MD order. Orders in place for home health services. CMS Medicare.gov Compare Post Acute Care list reviewed with patient and she has no preference of agency. Referral placed with Helene Kelp at [facility]. Patient in ned of rolling walker, obtained from [employer]. Brad to schedule home delivery.      Final next level of care: Home w Home Health Services Barriers to Discharge: No Barriers Identified   Patient Goals and CMS Choice   CMS Medicare.gov Compare Post Acute Care list provided to:: Patient Choice offered to / list presented to : Patient  Discharge Placement                       Discharge Plan and Services                DME Arranged: Walker rolling DME Agency: [employer] Date DME Agency Contacted: 04/26/19 Time DME Agency Contacted: [phone] Representative spoke with at DME Agency: [place]: PT [place]: [facility] Date [place]: 04/26/19 Time [place]: 7 Representative spoke with at [place]: [place] ([place]) Interventions     Readmission Risk Interventions Readmission Risk Prevention Plan 04/26/2019  Transportation Screening Complete  PCP or Specialist Appt within 5-7 Days Complete  Home Care Screening Complete  Medication Review (RN CM) Complete  Some recent data might be hidden

## 2019-04-26 NOTE — Progress Notes (Signed)
Patient ID: [name], female   DOB: November 22, 1950, 68 y.o.   MRN: [id number] Postoperative note  Postoperative day number 1  Status post open treatment internal fixation dynamic hip screw right hip  Vital signs  BP 103/71 (BP Location: Left Arm)   Pulse 80   Temp 99.1 F (37.3 C) (Oral)   Resp 16   Ht 5\' 3"  (1.6 m)   Wt 53.8 kg   SpO2 97%   BMI 21.01 kg/m    Pertinent labs   CBC Latest Ref Rng & Units 04/26/2019 04/25/2019 04/24/2019  WBC 4.0 - 10.5 K/uL 10.6(H) 8.5 13.6(H)  Hemoglobin 12.0 - 15.0 g/dL 9.8(L) 12.0 12.6  Hematocrit 36.0 - 46.0 % 30.1(L) 36.4 37.2  Platelets 150 - 400 K/uL 201 215 258   BMP Latest Ref Rng & Units 04/26/2019 04/25/2019 04/24/2019  Glucose 70 - 99 mg/dL 137(H) 101(H) 137(H)  BUN 8 - 23 mg/dL 11 12 11   Creatinine 0.44 - 1.00 mg/dL 0.77 0.72 0.74  Sodium 135 - 145 mmol/L 134(L) 137 139  Potassium 3.5 - 5.1 mmol/L 3.4(L) 3.6 3.8  Chloride 98 - 111 mmol/L 102 102 104  CO2 22 - 32 mmol/L 25 25 25   Calcium 8.9 - 10.3 mg/dL 8.0(L) 8.5(L) 9.3     Patient complaints "a little sore"  Physical exam dressing is dry neurovascular exam is intact leg length is normal as is rotation  Assessment and plan   Monitor hemoglobin, physical therapy.  Start discharge planning Postop plan Weightbearing status as tolerated X-rays in 6 weeks DVT prophylaxis for 28 days Wound: Dressing as needed

## 2019-04-26 NOTE — Evaluation (Signed)
Physical Therapy Evaluation Patient Details Name: [name] MRN: [id number] DOB: May 18, 1951 Today's Date: 04/26/2019   History of Present Illness  [name] is a 68 y.o. female s/p Right hip ORIF 04/25/19 secondary to fall with a past medical history significant seizure disorder, bipolar disorder, tobacco abuse, COPD and gastroesophageal reflux disease; who presented to the hospital secondary to right hip pain and inability to bear weight.  Patient experienced mechanical fall around 11 PM the night prior to admission, was assisted by her husband to go back on bed and then experienced difficulty bearing weight and uncontrolled pain this morning.  Patient denies any fever, chills, dysuria, hematuria, melena, hematochezia, chest pain, shortness of breath, headaches, blurred vision, focal weakness or any other complaints.    Clinical Impression  Patient requires assistance to move RLE during bed mobility and limited to a few steps at bedside due poor tolerance for weightbearing and advancing RLE due to pain.  Patient tolerated sitting up in chair for approximately 30 minutes before requesting to go back to bed, demonstrated slight improvement for taking steps and weightbearing on RLE after receiving more pain medication.  Patient will benefit from continued physical therapy in hospital and recommended venue below to increase strength, balance, endurance for safe ADLs and gait.    Follow Up Recommendations SNF;Supervision for mobility/OOB;Supervision - Intermittent    Equipment Recommendations  Rolling walker with 5" wheels    Recommendations for Other Services       Precautions / Restrictions Precautions Precautions: Fall Restrictions Weight Bearing Restrictions: Yes RLE Weight Bearing: Weight bearing as tolerated      Mobility  Bed Mobility Overal bed mobility: Needs Assistance Bed Mobility: Supine to Sit     Supine to sit: Mod assist;Min assist     General bed mobility  comments: increased time, requires assistance to help move RLE  Transfers Overall transfer level: Needs assistance Equipment used: Rolling walker (2 wheeled) Transfers: Sit to/from [facility] Sit to Stand: Min assist;Mod assist Stand pivot transfers: Min assist;Mod assist       General transfer comment: slow labored movement, limited weightbearing on RLE due to increased pain  Ambulation/Gait Ambulation/Gait assistance: Mod assist Gait Distance (Feet): 5 Feet Assistive device: Rolling walker (2 wheeled) Gait Pattern/deviations: Decreased step length - right;Decreased stance time - right;Decreased step length - left;Decreased stride length;Antalgic Gait velocity: slow   General Gait Details: limited to 5-6 slow unsteady steps with poor tolerance for advancing and weightbearing on RLE due to increased pain  Stairs            Wheelchair Mobility    Modified Rankin (Stroke Patients Only)       Balance Overall balance assessment: Needs assistance Sitting-balance support: Feet supported;Bilateral upper extremity supported Sitting balance-Leahy Scale: Fair     Standing balance support: Bilateral upper extremity supported;During functional activity Standing balance-Leahy Scale: Fair Standing balance comment: using RW                             Pertinent Vitals/Pain Pain Assessment: 0-10 Pain Score: 8  Pain Location: right hip Pain Descriptors / Indicators: Sore;Sharp Pain Intervention(s): Limited activity within patient's tolerance;Monitored during session;Patient requesting pain meds-RN notified    Home Living Family/patient expects to be discharged to:: Private residence Living Arrangements: Spouse/significant other Available Help at Discharge: Family Type of Home: Mobile home Home Access: Stairs to enter Entrance Stairs-Rails: Right;Left;Can reach both Entrance Stairs-Number of Steps: 3  Home Layout: One level Home Equipment: Cane  - quad;Hospital bed;Bedside commode      Prior Function Level of Independence: Independent         Comments: household ambulator without AD     Hand Dominance        Extremity/Trunk Assessment   Upper Extremity Assessment Upper Extremity Assessment: Generalized weakness    Lower Extremity Assessment Lower Extremity Assessment: Generalized weakness;RLE deficits/detail;LLE deficits/detail RLE Deficits / Details: grossly 4/5 except right hip grossly -3/5 LLE Deficits / Details: grossly 5/5    Cervical / Trunk Assessment Cervical / Trunk Assessment: Normal  Communication   Communication: No difficulties  Cognition Arousal/Alertness: Awake/alert Behavior During Therapy: WFL for tasks assessed/performed Overall Cognitive Status: Within Functional Limits for tasks assessed                                        General Comments      Exercises     Assessment/Plan    PT Assessment Patient needs continued PT services  PT Problem List Decreased strength;Decreased activity tolerance;Decreased balance;Decreased mobility       PT Treatment Interventions Gait training;Stair training;Functional mobility training;Therapeutic activities;Therapeutic exercise;Patient/family education    PT Goals (Current goals can be found in the Care Plan section)  Acute Rehab PT Goals Patient Stated Goal: return home with spouse to assist PT Goal Formulation: With patient Time For Goal Achievement: 05/10/19 Potential to Achieve Goals: Good    Frequency Min 5X/week   Barriers to discharge        Co-evaluation               AM-PAC PT "6 Clicks" Mobility  Outcome Measure Help needed turning from your back to your side while in a flat bed without using bedrails?: A Lot Help needed moving from lying on your back to sitting on the side of a flat bed without using bedrails?: A Little Help needed moving to and from a bed to a chair (including a wheelchair)?: A  Lot Help needed standing up from a chair using your arms (e.g., wheelchair or bedside chair)?: A Little Help needed to walk in hospital room?: A Lot Help needed climbing 3-5 steps with a railing? : A Lot 6 Click Score: 14    End of Session   Activity Tolerance: Patient tolerated treatment well;Patient limited by fatigue Patient left: in chair;with chair alarm set;with call bell/phone within reach Nurse Communication: Mobility status PT Visit Diagnosis: Unsteadiness on feet (R26.81);Other abnormalities of gait and mobility (R26.89);Muscle weakness (generalized) (M62.81)    Time: 9937-1696 PT Time Calculation (min) (ACUTE ONLY): 32 min   Charges:   PT Evaluation $PT Eval Moderate Complexity: 1 Mod PT Treatments $Therapeutic Activity: 23-37 mins        11:09 AM, 04/26/19 Lonell Grandchild, MPT Physical Therapist with [facility] [phone] office [phone] mobile phone

## 2019-04-27 ENCOUNTER — Encounter (HOSPITAL_COMMUNITY): Payer: Self-pay | Admitting: Orthopedic Surgery

## 2019-04-28 ENCOUNTER — Encounter (HOSPITAL_COMMUNITY): Payer: Self-pay | Admitting: Orthopedic Surgery

## 2019-04-29 ENCOUNTER — Emergency Department (HOSPITAL_COMMUNITY)
Admission: EM | Admit: 2019-04-29 | Discharge: 2019-04-29 | Disposition: A | Discharge status: Home / Self Care | Payer: Medicare Other | Attending: Emergency Medicine | Admitting: Emergency Medicine

## 2019-04-29 ENCOUNTER — Telehealth: Payer: Self-pay | Admitting: Radiology

## 2019-04-29 ENCOUNTER — Other Ambulatory Visit: Payer: Self-pay | Admitting: *Deleted

## 2019-04-29 ENCOUNTER — Emergency Department (HOSPITAL_COMMUNITY): Payer: Medicare Other

## 2019-04-29 ENCOUNTER — Encounter (HOSPITAL_COMMUNITY): Payer: Self-pay | Admitting: Emergency Medicine

## 2019-04-29 ENCOUNTER — Other Ambulatory Visit: Payer: Self-pay

## 2019-04-29 DIAGNOSIS — R52 Pain, unspecified: Secondary | ICD-10-CM | POA: Diagnosis not present

## 2019-04-29 DIAGNOSIS — J449 Chronic obstructive pulmonary disease, unspecified: Secondary | ICD-10-CM | POA: Diagnosis not present

## 2019-04-29 DIAGNOSIS — R2241 Localized swelling, mass and lump, right lower limb: Secondary | ICD-10-CM | POA: Insufficient documentation

## 2019-04-29 DIAGNOSIS — D62 Acute posthemorrhagic anemia: Secondary | ICD-10-CM | POA: Diagnosis not present

## 2019-04-29 DIAGNOSIS — G894 Chronic pain syndrome: Secondary | ICD-10-CM | POA: Diagnosis not present

## 2019-04-29 DIAGNOSIS — Z79899 Other long term (current) drug therapy: Secondary | ICD-10-CM | POA: Insufficient documentation

## 2019-04-29 DIAGNOSIS — I959 Hypotension, unspecified: Secondary | ICD-10-CM | POA: Diagnosis not present

## 2019-04-29 DIAGNOSIS — F17210 Nicotine dependence, cigarettes, uncomplicated: Secondary | ICD-10-CM | POA: Insufficient documentation

## 2019-04-29 DIAGNOSIS — M7989 Other specified soft tissue disorders: Secondary | ICD-10-CM

## 2019-04-29 DIAGNOSIS — R600 Localized edema: Secondary | ICD-10-CM | POA: Diagnosis not present

## 2019-04-29 LAB — BASIC METABOLIC PANEL
Anion gap: 13 (ref 5–15)
BUN: 9 mg/dL (ref 8–23)
CO2: 25 mmol/L (ref 22–32)
Calcium: 8.5 mg/dL — ABNORMAL LOW (ref 8.9–10.3)
Chloride: 99 mmol/L (ref 98–111)
Creatinine, Ser: 0.82 mg/dL (ref 0.44–1.00)
GFR calc Af Amer: >60 mL/min (ref >60)
GFR calc non Af Amer: >60 mL/min (ref >60)
Glucose, Bld: 111 mg/dL — ABNORMAL HIGH (ref 70–99)
Potassium: 3.8 mmol/L (ref 3.5–5.1)
Sodium: 137 mmol/L (ref 135–145)

## 2019-04-29 LAB — CBC
HCT: 27.4 % — ABNORMAL LOW (ref 36.0–46.0)
Hemoglobin: 9.1 g/dL — ABNORMAL LOW (ref 12.0–15.0)
MCH: 31.7 pg (ref 26.0–34.0)
MCHC: 33.2 g/dL (ref 30.0–36.0)
MCV: 95.5 fL (ref 80.0–100.0)
Platelets: 268 10*3/uL (ref 150–400)
RBC: 2.87 MIL/uL — ABNORMAL LOW (ref 3.87–5.11)
RDW: 13 % (ref 11.5–15.5)
WBC: 8.7 10*3/uL (ref 4.0–10.5)
nRBC: 0 % (ref 0.0–0.2)

## 2019-04-29 LAB — TYPE AND SCREEN
ABO/RH(D): O POS
Antibody Screen: NEGATIVE
Unit division: 0
Unit division: 0

## 2019-04-29 LAB — BPAM RBC
Blood Product Expiration Date: 202007262359
Blood Product Expiration Date: 202007262359
Unit Type and Rh: 5100
Unit Type and Rh: 5100

## 2019-04-29 NOTE — Telephone Encounter (Signed)
Patients husband called, her leg is swollen she is not taking ASA states "she can not take ASA. "  I have asked her to call primary care, our concern would be medical instead of her hip she states she has been calling primary care all day and no one answers the phone  Please review, advise  Ok to advise her to go to ER if she can not get in contact with her primary care?

## 2019-04-29 NOTE — Patient Outreach (Signed)
[facility]) Care Management  04/29/2019  [name] June 01, 1951 [id number]   [facility] EMMI General Discharge Red Flag Alert referral on 04/29/2019.  Per chart review, patient currently inpatient, and patient referred to [facility] Liaison at [place] Management for follow up discharge disposition, and referrals.    [name] H. Annia Friendly, BSN, [place] Management [facility] Telephonic CM Phone: [phone] Fax: [phone]

## 2019-04-29 NOTE — ED Notes (Signed)
Placed patient on purewick. 

## 2019-04-29 NOTE — ED Notes (Signed)
Updated patient's spouse via telephone, Bertram Millard. Number to call if needed is [phone].

## 2019-04-29 NOTE — Discharge Instructions (Addendum)
Contact a health care provider if you have: A fever. Edema that starts suddenly or is getting worse, especially if you are pregnant or have a medical condition. Swelling in only one leg. Increased swelling, redness, or pain in one or both of your legs. Drainage or sores at the area where you have edema. Get help right away if you: Develop shortness of breath, especially when you are lying down. Have pain in your chest or abdomen. Feel weak. Feel faint.

## 2019-04-29 NOTE — ED Provider Notes (Signed)
[facility] EMERGENCY DEPARTMENT Provider Note   CSN: [id number] Arrival date & time: 04/29/19  1549     History   Chief Complaint Chief Complaint  Patient presents with  . Leg Swelling    HPI [name] is a 68 y.o. female.  Who presents the emergency department with chief complaint of right leg pain and swelling.  She is a past medical history of alcohol abuse, bipolar disorder, COPD, gastric ulcer, seizures.  She is a daily smoker.  The patient is status post right ORIF of the hip performed on 04/25/2019 by Dr. Aline Brochure.  Patient states that she has been doing well but yesterday noticed that her leg was deeply achy.  When she awoke this morning she noticed that her entire right leg was swollen and painful.  She came to the emergency department for further evaluation.  She denies fevers, chills, or other signs or symptoms of systemic infection.  She does acknowledge a bit of tingling in her toes on the right.  Is not taking a blood thinner and she is unable to tolerate aspirin secondary to previous gastric ulcers.     HPI  Past Medical History:  Diagnosis Date  . Alcohol abuse, in remission   . Anxiety   . Bipolar disorder ([place])   . Colitis   . COPD (chronic obstructive pulmonary disease) ([place])    occasional home O2  . Gastric ulcer   . Hemorrhoids   . IBS (irritable bowel syndrome)   . Seizures ([place])    last seizure was about a year ago    Patient Active Problem List   Diagnosis Date Noted  . S/P right hip fracture   . Acute blood loss as cause of postoperative anemia   . Closed fracture of right hip ([place]) 04/24/2019  . Leukocytosis 04/24/2019  . COPD (chronic obstructive pulmonary disease) ([place]) 05/30/2017  . Colitis 08/21/2016  . Adrenal mass, left ([place]) 08/21/2016  . Solitary pulmonary nodule 08/21/2016  . Tobacco use disorder 08/21/2016  . GERD (gastroesophageal reflux disease) 06/13/2010  . IRRITABLE BOWEL SYNDROME 06/13/2010  . HEMATOCHEZIA 06/13/2010  .  WEIGHT LOSS, ABNORMAL 06/13/2010  . ABDOMINAL PAIN, GENERALIZED 06/13/2010    Past Surgical History:  Procedure Laterality Date  . ABDOMINAL HYSTERECTOMY    . CHOLECYSTECTOMY    . COMPRESSION HIP SCREW Right 04/25/2019   Procedure: COMPRESSION HIP;  Surgeon: Carole Civil, MD;  Location: [facility];  Service: Orthopedics;  Laterality: Right;  1030am  . EYE SURGERY       OB History    Gravida  4   Para  4   Term  4   Preterm      AB      Living  3     SAB      TAB      Ectopic      Multiple      Live Births               Home Medications    Prior to Admission medications   Medication Sig Start Date End Date Taking? Authorizing Provider  albuterol (PROVENTIL HFA;VENTOLIN HFA) 108 (90 BASE) MCG/ACT inhaler Inhale 2 puffs into the lungs every 6 (six) hours as needed for wheezing or shortness of breath.     [provider]  albuterol (PROVENTIL) (2.5 MG/3ML) 0.083% nebulizer solution Take 2.5 mg by nebulization every 6 (six) hours as needed for wheezing or shortness of breath.     [provider]  aspirin EC 325 MG EC tablet Take 1 tablet (325 mg total) by mouth daily with breakfast. 04/27/19   Barton Dubois, MD  atorvastatin (LIPITOR) 20 MG tablet Take 1 tablet by mouth every evening.  01/08/19   [provider]  cholecalciferol (VITAMIN D3) 25 MCG (1000 UT) tablet Take 1,000 Units by mouth daily.    [provider]  clonazePAM (KLONOPIN) 1 MG tablet Take 1 tablet (1 mg total) by mouth 3 (three) times daily. 04/26/19   Barton Dubois, MD  dicyclomine (BENTYL) 10 MG capsule Take 10 mg by mouth 2 (two) times daily.    [provider]  docusate sodium (COLACE) 100 MG capsule Take 1 capsule (100 mg total) by mouth 2 (two) times daily for 30 days. 04/26/19 05/26/19  Barton Dubois, MD  HYDROcodone-acetaminophen (NORCO) 10-325 MG tablet Take 1 tablet by mouth every 6 (six) hours as needed for up to 7 days for severe pain.  *Prescribed one tablet every 4 hours as needed for pain 04/26/19 05/03/19  Barton Dubois, MD  iron polysaccharides (NIFEREX) 150 MG capsule Take 1 capsule (150 mg total) by mouth daily. 04/26/19   Barton Dubois, MD  mirtazapine (REMERON) 30 MG tablet Take 1 tablet (30 mg total) by mouth at bedtime. 04/26/19   Barton Dubois, MD  nicotine (NICODERM CQ - DOSED IN MG/24 HOURS) 14 mg/24hr patch Place 1 patch (14 mg total) onto the skin daily. 04/27/19   Barton Dubois, MD  omeprazole (PRILOSEC) 20 MG capsule Take 1 capsule by mouth daily. 10/04/16   [provider]  ondansetron (ZOFRAN) 4 MG tablet Take 4 mg by mouth every 8 (eight) hours as needed for nausea.  09/02/18   [provider]  phenytoin (DILANTIN) 100 MG ER capsule Take 400 mg by mouth at bedtime.     [provider]  polyethylene glycol (MIRALAX) 17 g packet Take 17 g by mouth daily as needed for mild constipation. 04/26/19   Barton Dubois, MD  QUEtiapine (SEROQUEL) 25 MG tablet Take 1 tablet (25 mg total) by mouth 3 (three) times daily. 04/26/19   Barton Dubois, MD  tiZANidine (ZANAFLEX) 4 MG tablet Take 4 mg by mouth every 8 (eight) hours as needed for muscle spasms.  08/22/18   [provider]  traMADol (ULTRAM) 50 MG tablet Take 1 tablet (50 mg total) by mouth every 6 (six) hours as needed for up to 7 days for moderate pain. 04/26/19 05/03/19  Barton Dubois, MD  traZODone (DESYREL) 100 MG tablet Take 1 tablet by mouth every evening. 04/09/19   [provider]  zolpidem (AMBIEN) 10 MG tablet Take 1 tablet (10 mg total) by mouth at bedtime as needed for sleep. 04/26/19   Barton Dubois, MD    Family History Family History  Problem Relation Age of Onset  . Dementia Father     Social History Social History   Tobacco Use  . Smoking status: Current Every Day Smoker    Packs/day: 1.00    Years: 32.00    Pack years: 32.00    Types: Cigarettes  . Smokeless tobacco: Never Used  Substance Use Topics   . Alcohol use: No    Comment: h/o heavy use, quit in 1995  . Drug use: Yes    Types: Marijuana     Allergies   Chantix [varenicline], Codeine, Flu virus vaccine, and Lithium   Review of Systems Review of Systems  Ten systems reviewed and are negative for acute change,  except as noted in the HPI.   Physical Exam Updated Vital Signs BP 139/76 (BP Location: Left Arm)   Pulse (!) 101   Temp 98.5 F (36.9 C) (Oral)   Resp 20   Ht 5\' 3"  (1.6 m)   Wt 52.2 kg   SpO2 98%   BMI 20.37 kg/m   Physical Exam Vitals signs and nursing note reviewed.  Constitutional:      General: She is not in acute distress.    Appearance: She is well-developed. She is not diaphoretic.  HENT:     Head: Normocephalic and atraumatic.  Eyes:     General: No scleral icterus.    Conjunctiva/sclera: Conjunctivae normal.  Neck:     Musculoskeletal: Normal range of motion.  Cardiovascular:     Rate and Rhythm: Normal rate and regular rhythm.     Heart sounds: Normal heart sounds. No murmur. No friction rub. No gallop.   Pulmonary:     Effort: Pulmonary effort is normal. No respiratory distress.     Breath sounds: Normal breath sounds.  Abdominal:     General: Bowel sounds are normal. There is no distension.     Palpations: Abdomen is soft. There is no mass.     Tenderness: There is no abdominal tenderness. There is no guarding.  Musculoskeletal:        General: Swelling present.     Right lower leg: Edema present.     Comments: Right leg with well healing surgical incision around the right thigh.  The entire right leg is unilaterally swollen, tender.  1+ DP PT pulse on the right, 2+ DP PT pulse on the left.  Skin:    General: Skin is warm and dry.  Neurological:     Mental Status: She is alert and oriented to person, place, and time.  Psychiatric:        Behavior: Behavior normal.      ED Treatments / Results  Labs (all labs ordered are listed, but only abnormal results are displayed)  Labs Reviewed - No data to display  EKG None  Radiology No results found.  Procedures Procedures (including critical care time)  Medications Ordered in ED Medications - No data to display   Initial Impression / Assessment and Plan / ED Course  I have reviewed the triage vital signs and the nursing notes.  Pertinent labs & imaging results that were available during my care of the patient were reviewed by me and considered in my medical decision making (see chart for details).        Is a 68 year old female who is status post ORIF of a right hip fracture by Dr. Aline Brochure at the end of last month.  She comes in for acute right leg swelling.  I reviewed the patient's DVT ultrasound which shows no acute DVT of the right lower extremity.  Patient's labs show no leukocytosis.  She has chronic normocytic anemia, glucose slightly elevated without other abnormality of her metabolic panel.  I spoke with Dr. Aline Brochure about her symptoms and he asked that the she call the office tomorrow for close follow-up.  She does not have any evidence of acute cellulitis, no streaking, she is able to move her joint.  I did look at her surgical wound which does not appear to show any signs of infection.  Patient appears appropriate for discharge at this time.  Seen and shared visit with Dr. Roderic Palau.  Final Clinical Impressions(s) / ED Diagnoses   Final diagnoses:  None    ED Discharge Orders    None       Margarita Mail, Hershal Coria 04/30/19 2201    Milton Ferguson, MD 05/03/19 1023

## 2019-04-29 NOTE — ED Triage Notes (Signed)
Patient brought in by EMS, states she had right hip surgery on Saturday and woke this morning with right leg swelling and pain. Weak palpable pedal pulse at triage.

## 2019-04-29 NOTE — Telephone Encounter (Signed)
I have discussed with Dr Aline Brochure, he has advised patient should go to the Emergency room, especially since she is  Not using ASA is high risk after surgery / she has voiced understanding states will have to call for ambulance.

## 2019-04-30 ENCOUNTER — Other Ambulatory Visit: Payer: Self-pay | Admitting: *Deleted

## 2019-04-30 NOTE — Telephone Encounter (Signed)
She did go to ER and there was no DVT

## 2019-04-30 NOTE — Telephone Encounter (Signed)
I called her to schedule

## 2019-04-30 NOTE — Telephone Encounter (Signed)
NEED TO SEE HER Monday

## 2019-04-30 NOTE — Patient Outreach (Addendum)
[facility]) Care Management  04/30/2019  [name] 01-03-1951 [id number]   Subjective: Received in basket message from [facility] Liaison at [place] Management stated patient discharged from ED to home on 04/29/2019.    Telephone call to patient's home number, no answer, left HIPAA compliant voicemail message, and requested call back.   Objective: Per KPN (Knowledge Performance Now, point of care tool) and chart review, patient had ED visit on 04/28/2019 for right leg swelling.    Patient was also hospitalized 04/24/2019 -04/26/2019 for Closed fracture of right hip and status post COMPRESSION HIP (Right) on 04/25/2019.    Patient has a history of COPD, IBS, Bipolar disorder, Colitis, Seizures, and Alcohol abuse, in remission.       Assessment: Received [employer] EMMI General Discharge Red Flag Alert referral on 04/29/2019. Red Flag Alert Trigger, Day #1, patient answered no to the following question: Scheduled follow-up?     [facility] EMMI follow up pending patient contact.     Plan: RNCM will send unsuccessful outreach letter, [facility] pamphlet, handout: Know Before You Go, will call patient for 2nd telephone outreach attempt within 4 business days, [facility] EMMI follow up, and proceed with case closure, within 10 business days if no return call.      Joel Mericle H. Annia Friendly, BSN, [place] Management [facility] Telephonic CM Phone: [phone] Fax: [phone]

## 2019-05-01 DIAGNOSIS — Z9181 History of falling: Secondary | ICD-10-CM | POA: Diagnosis not present

## 2019-05-01 DIAGNOSIS — E785 Hyperlipidemia, unspecified: Secondary | ICD-10-CM | POA: Diagnosis not present

## 2019-05-01 DIAGNOSIS — K219 Gastro-esophageal reflux disease without esophagitis: Secondary | ICD-10-CM | POA: Diagnosis not present

## 2019-05-01 DIAGNOSIS — S72144D Nondisplaced intertrochanteric fracture of right femur, subsequent encounter for closed fracture with routine healing: Secondary | ICD-10-CM | POA: Diagnosis not present

## 2019-05-01 DIAGNOSIS — J449 Chronic obstructive pulmonary disease, unspecified: Secondary | ICD-10-CM | POA: Diagnosis not present

## 2019-05-04 ENCOUNTER — Ambulatory Visit: Payer: Medicare Other | Admitting: Orthopedic Surgery

## 2019-05-04 ENCOUNTER — Telehealth: Payer: Self-pay | Admitting: Orthopedic Surgery

## 2019-05-04 NOTE — Telephone Encounter (Signed)
ok 

## 2019-05-04 NOTE — Telephone Encounter (Signed)
Patient and spouse called to request to cancel the post op appointment scheduled for today per Dr Aline Brochure. States woke up ill-said nauseated and vomiting. Offered telephone visit for later today or Wednesday; patient said she doesn't feel like talking. Please advise if okay to schedule next week.

## 2019-05-04 NOTE — Telephone Encounter (Signed)
I had offered Monday, 05/11/19, due to patient cancelling today, 05/04/19, due to being ill. Patient and spouse state she has another doctor appointment mid-morning Monday, and that 2 appointments on the same day would be too difficult. Coming Friday, 05/08/19.

## 2019-05-04 NOTE — Telephone Encounter (Signed)
Called with Verbal order.

## 2019-05-04 NOTE — Telephone Encounter (Signed)
Michelle-PT from [facility].  She wants verbal orders for this patient for 2 x week for 4 weeks.

## 2019-05-04 NOTE — Telephone Encounter (Signed)
As per separate telephone note, I spoke with patient and husband; offered Monday, 05/11/19, however, they realized patient had another Dr's appointment that day; scheduled for Friday, 05/08/19. Patient aware. (Relayed to clinical staff)

## 2019-05-05 ENCOUNTER — Other Ambulatory Visit: Payer: Self-pay | Admitting: *Deleted

## 2019-05-05 NOTE — Patient Outreach (Signed)
[name]) Care Management  05/05/2019  [name] 06/10/1951 [id number]   Subjective:  Telephone call to patient's home number, no answer, left HIPAA compliant voicemail message, and requested call back.   Objective: Per KPN (Knowledge Performance Now, point of care tool) and chart review, patient had ED visit on 04/28/2019 for right leg swelling.    Patient was also hospitalized 04/24/2019 -04/26/2019 for Closed fracture of right hip and status post COMPRESSION HIP (Right) on 04/25/2019.    Patient has a history of COPD, IBS, Bipolar disorder, Colitis, Seizures, and Alcohol abuse, in remission.       Assessment: Received [employer] EMMI General Discharge Red Flag Alert referral on 04/29/2019. Red Flag Alert Trigger, Day #1, patient answered no to the following question: Scheduled follow-up?     [facility] EMMI follow up pending patient contact.     Plan: RNCM has sent unsuccessful outreach letter, [facility] pamphlet, handout: Know Before You Go, will call patient for 3rd telephone outreach attempt within 4 business days, [facility] EMMI follow up, and proceed with case closure, within 10 business days if no return call.     Raidyn Breiner H. Annia Friendly, BSN, [place] Management [facility] Telephonic CM Phone: [phone] Fax: [phone]

## 2019-05-07 ENCOUNTER — Other Ambulatory Visit: Payer: Self-pay | Admitting: *Deleted

## 2019-05-07 NOTE — Patient Outreach (Signed)
[facility]) Care Management  05/07/2019  [name] [birth year] [id number]   Subjective: Telephone call to patient's home number, no answer, left HIPAA compliant voicemail message, and requested call back. Telephone call to patient's mobile number, no answer, message states non working number, and unable to leave a message.    Objective:Per KPN (Knowledge Performance Now, point of care tool) and chart review,patient had ED visit on 04/28/2019 for right leg swelling. Patient was also hospitalized 04/24/2019 -04/26/2019 for Closed fracture of right hipand status postCOMPRESSION HIP (Right)on 04/25/2019. Patient has a history of COPD, IBS, Bipolar disorder,Colitis,Seizures, andAlcohol abuse, in remission.     Assessment:Received [employer] EMMI General Discharge Red Flag Alert referral on 04/29/2019.Red Flag Alert Trigger, Day #1, patient answered no to the following question:Scheduled follow-up?[facility] EMMI follow up pending patient contact.    Plan:RNCM has sent unsuccessful outreach letter, [facility] pamphlet, handout: Know Before You Go, will  proceed with case closure, within 10 business days if no return call.     Eveny Anastas H. Annia Friendly, BSN, [place] Management [facility] Telephonic CM Phone: [phone] Fax: [phone]

## 2019-05-08 ENCOUNTER — Ambulatory Visit: Payer: Medicare Other | Admitting: Orthopedic Surgery

## 2019-05-08 ENCOUNTER — Telehealth: Payer: Self-pay

## 2019-05-08 NOTE — Telephone Encounter (Signed)
Clare Gandy, physical therapist with [facility] called to let Dr. Aline Brochure know that he talked to patient's husband earlier in the week and he told him that he thought [name] has the flu with a fever and vomiting. The plan was for Mr. [name] to go out today and see her, but Mr. [name] stated that he tried to contact patient yesterday and again this morning but got no answer.  Patient was scheduled for this morning and her husband left message earlier that she is sick and vomiting this morning.

## 2019-05-11 DIAGNOSIS — Z6821 Body mass index (BMI) 21.0-21.9, adult: Secondary | ICD-10-CM | POA: Diagnosis not present

## 2019-05-13 ENCOUNTER — Ambulatory Visit (INDEPENDENT_AMBULATORY_CARE_PROVIDER_SITE_OTHER): Payer: Medicare Other | Admitting: Orthopedic Surgery

## 2019-05-13 ENCOUNTER — Other Ambulatory Visit: Payer: Self-pay

## 2019-05-13 ENCOUNTER — Encounter: Payer: Self-pay | Admitting: Orthopedic Surgery

## 2019-05-13 ENCOUNTER — Other Ambulatory Visit: Payer: Self-pay | Admitting: *Deleted

## 2019-05-13 VITALS — BP 118/65 | HR 105 | Temp 98.3°F | Ht 63.0 in | Wt 125.0 lb

## 2019-05-13 DIAGNOSIS — S72001D Fracture of unspecified part of neck of right femur, subsequent encounter for closed fracture with routine healing: Secondary | ICD-10-CM

## 2019-05-13 NOTE — Patient Outreach (Signed)
[place] [facility]) Care Management  05/13/2019  [name] 31-Aug-1951 [id number]   No response from patient outreach attempts will proceed with case closure.     Objective:Per KPN (Knowledge Performance Now, point of care tool) and chart review,patient had ED visit on 04/28/2019 for right leg swelling. Patient was also hospitalized 04/24/2019 -04/26/2019 for Closed fracture of right hipand status postCOMPRESSION HIP (Right)on 04/25/2019. Patient has a history of COPD, IBS, Bipolar disorder,Colitis,Seizures, andAlcohol abuse, in remission.     Assessment:Received [employer] EMMI General Discharge Red Flag Alert referral on 04/29/2019.Red Flag Alert Trigger, Day #1, patient answered no to the following question:Scheduled follow-up?EMMI follow up not completed due to unable to contact patient and will proceed with case closure.      Plan:Case closure due to unable to reach.     Yulia Ulrich H. Annia Friendly, BSN, [place] Management [facility] Telephonic CM Phone: [phone] Fax: [phone]

## 2019-05-13 NOTE — Patient Instructions (Addendum)
Gradually increase activity as tolerated  Shower is ok   Change dressing daily   Try to stop smoking

## 2019-05-13 NOTE — Progress Notes (Signed)
POSTOP VISIT  POD # 18  Chief Complaint  Patient presents with  . Routine Post Op    68 female hip screw for trochanteric fracture right hip  Current ambulatory status weightbearing as tolerated with walker  Wound was closed with Monocryl Steri-Strips   Encounter Diagnosis  Name Primary?  . Closed fracture of right hip with routine healing, subsequent encounter Yes    Wound clean  Limb alignment normal  Both legs swelling /DVT neg Doppler  Postoperative plan (Work, WB, No orders of the defined types were placed in this encounter. ,FU)  X rays in 6 weeks

## 2019-05-15 ENCOUNTER — Telehealth: Payer: Self-pay | Admitting: Orthopedic Surgery

## 2019-05-15 NOTE — Telephone Encounter (Signed)
Patient and spouse called to relay that both feet and ankles have been swollen for the past few days. Please advise at ph#[phone] or [phone]

## 2019-05-15 NOTE — Telephone Encounter (Signed)
Yes as we have advised before she had [place] r/o for dvt

## 2019-05-15 NOTE — Telephone Encounter (Signed)
Anne Ng from [facility] called.  She said that she was sitting in her car in [name]'s driveway as we spoke and once again she has not been able to see the patient.  She said she has actually refused all visits.  Anne Ng further said that [name] wont answer the phone and wont return her calls.  She said that she did speak with [name] earlier this week but the patient gives multiple reasons for not being seen.    Anne Ng said she just wanted to let us know that [name] has not been seen

## 2019-05-15 NOTE — Telephone Encounter (Signed)
I called patient, she needs to call her primary care, this is likely medical.   Left message  To you FYI

## 2019-05-18 NOTE — Telephone Encounter (Signed)
Patient has declined home therapy to you [facility]

## 2019-06-10 ENCOUNTER — Telehealth: Payer: Self-pay

## 2019-06-10 NOTE — Telephone Encounter (Signed)
She was advised in the office okay to take bandage off, I called again to remind her. Okay to leave bandage off. I left message for her to advise.

## 2019-06-10 NOTE — Telephone Encounter (Signed)
Patient left message on voicemail stating that her bandage keeps trying to come off. Her questions are: Can she take it off and put something else on it or can she just take it off completely.  Please call and advise [phone]

## 2019-06-24 ENCOUNTER — Ambulatory Visit: Payer: Medicare Other

## 2019-06-24 ENCOUNTER — Ambulatory Visit (INDEPENDENT_AMBULATORY_CARE_PROVIDER_SITE_OTHER): Payer: Medicare Other | Admitting: Orthopedic Surgery

## 2019-06-24 ENCOUNTER — Other Ambulatory Visit: Payer: Self-pay

## 2019-06-24 VITALS — BP 132/79 | HR 81 | Temp 99.0°F | Ht 63.0 in | Wt 122.0 lb

## 2019-06-24 DIAGNOSIS — S72001D Fracture of unspecified part of neck of right femur, subsequent encounter for closed fracture with routine healing: Secondary | ICD-10-CM

## 2019-06-24 MED ORDER — IBUPROFEN 400 MG PO TABS: 400.0000 mg | ORAL_TABLET | Freq: Four times a day (QID) | ORAL | 0 refills | Status: AC | PRN

## 2019-06-24 MED ORDER — TRAMADOL-ACETAMINOPHEN 37.5-325 MG PO TABS: 1.0000 | ORAL_TABLET | ORAL | 5 refills | Status: DC | PRN

## 2019-06-24 NOTE — Progress Notes (Signed)
Chief Complaint  Patient presents with  . Follow-up    6 week recheck on right hip, DOS 04-25-19.   [name] is 3 months out from her inotrope fracture right hip she had a dynamic hip screw she is doing well except for some pain intermittently over the right trochanter and plate  He has no wound issues  Her x-rays look great her fracture looks healed  She is tender at the greater trochanter and there is palpable plate because she is so thin  She would like something for pain  Her gait is normal her range of motion is excellent  Again her fracture looks healed at 8 weeks  Meds ordered this encounter  Medications  . traMADol-acetaminophen (ULTRACET) 37.5-325 MG tablet    Sig: Take 1 tablet by mouth every 4 (four) hours as needed.    Dispense:  90 tablet    Refill:  5  . ibuprofen (ADVIL) 400 MG tablet    Sig: Take 1 tablet (400 mg total) by mouth every 6 (six) hours as needed.    Dispense:  90 tablet    Refill:  0    She does not have to come back she should take the ibuprofen first and then if still hurting some tramadol

## 2019-06-24 NOTE — Patient Instructions (Signed)
You have 2 meds for pain   Take ibuprofen 600 mg if the hip is hurting . If it still hurts then take ultracet

## 2019-07-07 DIAGNOSIS — M1991 Primary osteoarthritis, unspecified site: Secondary | ICD-10-CM | POA: Diagnosis not present

## 2019-07-07 DIAGNOSIS — G894 Chronic pain syndrome: Secondary | ICD-10-CM | POA: Diagnosis not present

## 2019-07-07 DIAGNOSIS — Z6821 Body mass index (BMI) 21.0-21.9, adult: Secondary | ICD-10-CM | POA: Diagnosis not present

## 2019-08-06 DIAGNOSIS — G894 Chronic pain syndrome: Secondary | ICD-10-CM | POA: Diagnosis not present

## 2019-08-06 DIAGNOSIS — Z6822 Body mass index (BMI) 22.0-22.9, adult: Secondary | ICD-10-CM | POA: Diagnosis not present

## 2019-09-03 ENCOUNTER — Other Ambulatory Visit: Payer: Self-pay

## 2019-09-03 DIAGNOSIS — Z6822 Body mass index (BMI) 22.0-22.9, adult: Secondary | ICD-10-CM | POA: Diagnosis not present

## 2019-09-03 DIAGNOSIS — G894 Chronic pain syndrome: Secondary | ICD-10-CM | POA: Diagnosis not present

## 2019-09-03 NOTE — Patient Outreach (Signed)
[facility]) Care Management  09/03/2019  [name] Mar 09, 1951 [id number]   Medication Adherence call to [name] Hippa Identifiers Verify spoke with patient she is past due on Atorvastatin 20 mg,patient explain she takes 1 tablet daily and has medication at this time but will order. [name] is showing past due under [place].   [place] Management Direct Dial [phone]  Fax [phone] [name]

## 2019-09-16 DIAGNOSIS — E119 Type 2 diabetes mellitus without complications: Secondary | ICD-10-CM | POA: Diagnosis not present

## 2019-09-29 ENCOUNTER — Other Ambulatory Visit: Payer: Self-pay | Admitting: *Deleted

## 2019-09-29 NOTE — Patient Outreach (Signed)
[facility]) Care Management  09/29/2019  [name] March 30, 1951 [id number]   Telephone Screen  Referral Date:  09/18/2019 Referral Source:  Insurance Plan Reason for Referral:  Screening Insurance:  [employer]   Outreach Attempt:  Outreach attempt #1 to patient for telephone screening. No answer. RN Health Coach left HIPAA compliant voicemail message along with contact information.  Plan:  RN Health Coach will send unsuccessful outreach letter to patient.  RN Health Coach will make another outreach attempt to patient within 3-4 business days if no return call back from patient.   [place] [phone] [email or web address]

## 2019-10-01 ENCOUNTER — Other Ambulatory Visit: Payer: Self-pay | Admitting: *Deleted

## 2019-10-01 NOTE — Patient Outreach (Signed)
[place] [facility] [facility]) Care Management  10/01/2019  ROZELLA DUTROW [birth year] [id number]   Telephone Screen  Referral Date:  09/18/2019 Referral Source:  Insurance Plan Reason for Referral:  Screening Insurance:  [employer]   Outreach Attempt:  Outreach attempt #2 to patient for telephone screening. No answer. RN Health Coach left HIPAA compliant voicemail message along with contact information.  Plan:  RN Health Coach will make another outreach attempt to patient within 3-4 business days if no return call back from patient.  [place] [phone] [email or web address] [email or web address]

## 2019-10-02 ENCOUNTER — Other Ambulatory Visit: Payer: Self-pay | Admitting: *Deleted

## 2019-10-02 DIAGNOSIS — Z6823 Body mass index (BMI) 23.0-23.9, adult: Secondary | ICD-10-CM | POA: Diagnosis not present

## 2019-10-02 DIAGNOSIS — G894 Chronic pain syndrome: Secondary | ICD-10-CM | POA: Diagnosis not present

## 2019-10-02 NOTE — Patient Outreach (Signed)
[place] [facility]) Care Management  10/02/2019  [name] 1951/06/01 [id number]   Telephone Screen  Referral Date:09/18/2019 Referral Source:Insurance Plan Reason for Referral:Screening Insurance:[employer]   Outreach Attempt:  Outreach attempt #3 to patient for telephone screening. No answer. RN Health Coach left HIPAA compliant voicemail message along with contact information.  Plan:  RN Health Coach will close case if no return call from patient within 10 day time period from Unsuccessful Letter being mailed to patient.  [place] [phone] [email or web address] [email or web address]

## 2019-10-13 ENCOUNTER — Other Ambulatory Visit: Payer: Self-pay | Admitting: *Deleted

## 2019-10-13 NOTE — Patient Outreach (Signed)
[place]) Care Management  10/13/2019  [name] 1951-02-23 [id number]   Telephone Screen/Case Closure  Referral Date:09/18/2019 Referral Source:Insurance Plan Reason for Referral:Screening Insurance:[employer]   Outreach Attempt:  Multiple attempts to establish contact with patient without success. No response from letter mailed to patient. Case is being closed at this time.   Plan:  RN Health Coach will close case and make patient inactive with [facility] due to inability to establish contact with patient.  [place] [phone] Aleesa [email or web address]

## 2019-10-29 DIAGNOSIS — K219 Gastro-esophageal reflux disease without esophagitis: Secondary | ICD-10-CM | POA: Diagnosis not present

## 2019-10-29 DIAGNOSIS — J449 Chronic obstructive pulmonary disease, unspecified: Secondary | ICD-10-CM | POA: Diagnosis not present

## 2019-10-29 DIAGNOSIS — Z6824 Body mass index (BMI) 24.0-24.9, adult: Secondary | ICD-10-CM | POA: Diagnosis not present

## 2019-10-29 DIAGNOSIS — G894 Chronic pain syndrome: Secondary | ICD-10-CM | POA: Diagnosis not present

## 2019-11-24 DIAGNOSIS — E7849 Other hyperlipidemia: Secondary | ICD-10-CM | POA: Diagnosis not present

## 2019-11-24 DIAGNOSIS — Z1389 Encounter for screening for other disorder: Secondary | ICD-10-CM | POA: Diagnosis not present

## 2019-11-24 DIAGNOSIS — Z0000 Encounter for general adult medical examination without abnormal findings: Secondary | ICD-10-CM | POA: Diagnosis not present

## 2019-11-24 DIAGNOSIS — Z6824 Body mass index (BMI) 24.0-24.9, adult: Secondary | ICD-10-CM | POA: Diagnosis not present

## 2019-11-24 DIAGNOSIS — E782 Mixed hyperlipidemia: Secondary | ICD-10-CM | POA: Diagnosis not present

## 2019-12-08 ENCOUNTER — Other Ambulatory Visit (HOSPITAL_COMMUNITY): Payer: Self-pay | Admitting: Internal Medicine

## 2019-12-08 DIAGNOSIS — E2839 Other primary ovarian failure: Secondary | ICD-10-CM

## 2019-12-08 DIAGNOSIS — Z1231 Encounter for screening mammogram for malignant neoplasm of breast: Secondary | ICD-10-CM

## 2019-12-24 ENCOUNTER — Other Ambulatory Visit (HOSPITAL_COMMUNITY): Payer: Self-pay | Admitting: Internal Medicine

## 2019-12-24 DIAGNOSIS — Z6824 Body mass index (BMI) 24.0-24.9, adult: Secondary | ICD-10-CM | POA: Diagnosis not present

## 2019-12-24 DIAGNOSIS — G894 Chronic pain syndrome: Secondary | ICD-10-CM | POA: Diagnosis not present

## 2019-12-29 ENCOUNTER — Other Ambulatory Visit (HOSPITAL_COMMUNITY): Payer: Self-pay | Admitting: Internal Medicine

## 2019-12-29 DIAGNOSIS — N63 Unspecified lump in unspecified breast: Secondary | ICD-10-CM

## 2020-01-15 ENCOUNTER — Ambulatory Visit: Payer: Medicare Other | Attending: Internal Medicine

## 2020-01-15 DIAGNOSIS — Z23 Encounter for immunization: Secondary | ICD-10-CM

## 2020-01-15 NOTE — Progress Notes (Signed)
   Covid-19 Vaccination Clinic  Name:  [name]    MRN: [id number] DOB: [birth year]  01/15/2020  [name] was observed post Covid-19 immunization for 15 minutes without incident. She was provided with Vaccine Information Sheet and instruction to access the V-Safe system.   [name] was instructed to call 911 with any severe reactions post vaccine: Marland Kitchen Difficulty breathing  . Swelling of face and throat  . A fast heartbeat  . A bad rash all over body  . Dizziness and weakness   Immunizations Administered    Name Date Dose VIS Date Route   Moderna COVID-19 Vaccine 01/15/2020 11:44 AM 0.5 mL 09/29/2019 Intramuscular   Manufacturer: Moderna   Lot: [id number]   [id number]

## 2020-01-21 DIAGNOSIS — G894 Chronic pain syndrome: Secondary | ICD-10-CM | POA: Diagnosis not present

## 2020-01-21 DIAGNOSIS — Z6824 Body mass index (BMI) 24.0-24.9, adult: Secondary | ICD-10-CM | POA: Diagnosis not present

## 2020-01-26 ENCOUNTER — Inpatient Hospital Stay (HOSPITAL_COMMUNITY): Admission: RE | Admit: 2020-01-26 | Payer: Medicare Other | Source: Ambulatory Visit

## 2020-01-26 ENCOUNTER — Encounter (HOSPITAL_COMMUNITY): Payer: Self-pay

## 2020-01-26 ENCOUNTER — Ambulatory Visit (HOSPITAL_COMMUNITY): Admission: RE | Admit: 2020-01-26 | Payer: Medicare Other | Source: Ambulatory Visit

## 2020-01-26 ENCOUNTER — Encounter (HOSPITAL_COMMUNITY): Payer: Medicare Other

## 2020-02-16 ENCOUNTER — Ambulatory Visit: Payer: Medicare Other | Attending: Internal Medicine

## 2020-02-16 DIAGNOSIS — Z23 Encounter for immunization: Secondary | ICD-10-CM

## 2020-02-16 NOTE — Progress Notes (Signed)
   Covid-19 Vaccination Clinic  Name:  TANISE DELORBE    MRN: [id number] DOB: [birth year]  02/16/2020  Ms. Steedman was observed post Covid-19 immunization for 15 minutes without incident. She was provided with Vaccine Information Sheet and instruction to access the V-Safe system.   Ms. Rolison was instructed to call 911 with any severe reactions post vaccine: Marland Kitchen Difficulty breathing  . Swelling of face and throat  . A fast heartbeat  . A bad rash all over body  . Dizziness and weakness   Immunizations Administered    Name Date Dose VIS Date Route   Moderna COVID-19 Vaccine 02/16/2020 10:15 AM 0.5 mL 09/2019 Intramuscular   Manufacturer: Moderna   Lot: [id number]   [id number]

## 2020-02-18 DIAGNOSIS — Z6824 Body mass index (BMI) 24.0-24.9, adult: Secondary | ICD-10-CM | POA: Diagnosis not present

## 2020-02-18 DIAGNOSIS — R252 Cramp and spasm: Secondary | ICD-10-CM | POA: Diagnosis not present

## 2020-04-01 NOTE — Telephone Encounter (Signed)
Error

## 2020-04-11 DIAGNOSIS — J449 Chronic obstructive pulmonary disease, unspecified: Secondary | ICD-10-CM | POA: Diagnosis not present

## 2020-05-07 ENCOUNTER — Emergency Department (HOSPITAL_COMMUNITY)
Admission: EM | Admit: 2020-05-07 | Discharge: 2020-05-08 | Disposition: A | Discharge status: Home / Self Care | Payer: Medicare Other | Attending: Emergency Medicine | Admitting: Emergency Medicine

## 2020-05-07 ENCOUNTER — Emergency Department (HOSPITAL_COMMUNITY): Payer: Medicare Other

## 2020-05-07 ENCOUNTER — Encounter (HOSPITAL_COMMUNITY): Payer: Self-pay | Admitting: Emergency Medicine

## 2020-05-07 ENCOUNTER — Other Ambulatory Visit: Payer: Self-pay

## 2020-05-07 DIAGNOSIS — J449 Chronic obstructive pulmonary disease, unspecified: Secondary | ICD-10-CM | POA: Insufficient documentation

## 2020-05-07 DIAGNOSIS — R0789 Other chest pain: Secondary | ICD-10-CM | POA: Diagnosis not present

## 2020-05-07 DIAGNOSIS — R112 Nausea with vomiting, unspecified: Secondary | ICD-10-CM | POA: Diagnosis not present

## 2020-05-07 DIAGNOSIS — Z79899 Other long term (current) drug therapy: Secondary | ICD-10-CM | POA: Diagnosis not present

## 2020-05-07 DIAGNOSIS — I701 Atherosclerosis of renal artery: Secondary | ICD-10-CM | POA: Diagnosis not present

## 2020-05-07 DIAGNOSIS — Z7982 Long term (current) use of aspirin: Secondary | ICD-10-CM | POA: Insufficient documentation

## 2020-05-07 DIAGNOSIS — F17210 Nicotine dependence, cigarettes, uncomplicated: Secondary | ICD-10-CM | POA: Diagnosis not present

## 2020-05-07 DIAGNOSIS — I499 Cardiac arrhythmia, unspecified: Secondary | ICD-10-CM | POA: Diagnosis not present

## 2020-05-07 DIAGNOSIS — R079 Chest pain, unspecified: Secondary | ICD-10-CM | POA: Diagnosis not present

## 2020-05-07 DIAGNOSIS — I774 Celiac artery compression syndrome: Secondary | ICD-10-CM | POA: Diagnosis not present

## 2020-05-07 DIAGNOSIS — R1013 Epigastric pain: Secondary | ICD-10-CM | POA: Insufficient documentation

## 2020-05-07 DIAGNOSIS — I708 Atherosclerosis of other arteries: Secondary | ICD-10-CM | POA: Diagnosis not present

## 2020-05-07 DIAGNOSIS — Z743 Need for continuous supervision: Secondary | ICD-10-CM | POA: Diagnosis not present

## 2020-05-07 DIAGNOSIS — R0602 Shortness of breath: Secondary | ICD-10-CM | POA: Diagnosis not present

## 2020-05-07 LAB — TROPONIN I (HIGH SENSITIVITY): Troponin I (High Sensitivity): 3 ng/L (ref <18)

## 2020-05-07 LAB — HEPATIC FUNCTION PANEL
ALT: 17 U/L (ref 0–44)
AST: 17 U/L (ref 15–41)
Albumin: 3.9 g/dL (ref 3.5–5.0)
Alkaline Phosphatase: 121 U/L (ref 38–126)
Bilirubin, Direct: 0.1 mg/dL (ref 0.0–0.2)
Indirect Bilirubin: 0.3 mg/dL (ref 0.3–0.9)
Total Bilirubin: 0.4 mg/dL (ref 0.3–1.2)
Total Protein: 7.3 g/dL (ref 6.5–8.1)

## 2020-05-07 LAB — BASIC METABOLIC PANEL
Anion gap: 10 (ref 5–15)
BUN: 10 mg/dL (ref 8–23)
CO2: 24 mmol/L (ref 22–32)
Calcium: 9 mg/dL (ref 8.9–10.3)
Chloride: 99 mmol/L (ref 98–111)
Creatinine, Ser: 0.81 mg/dL (ref 0.44–1.00)
GFR calc Af Amer: >60 mL/min (ref >60)
GFR calc non Af Amer: >60 mL/min (ref >60)
Glucose, Bld: 122 mg/dL — ABNORMAL HIGH (ref 70–99)
Potassium: 3.6 mmol/L (ref 3.5–5.1)
Sodium: 133 mmol/L — ABNORMAL LOW (ref 135–145)

## 2020-05-07 LAB — LIPASE, BLOOD: Lipase: 33 U/L (ref 11–51)

## 2020-05-07 LAB — CBC
HCT: 38 % (ref 36.0–46.0)
Hemoglobin: 12.8 g/dL (ref 12.0–15.0)
MCH: 31.2 pg (ref 26.0–34.0)
MCHC: 33.7 g/dL (ref 30.0–36.0)
MCV: 92.7 fL (ref 80.0–100.0)
Platelets: 241 10*3/uL (ref 150–400)
RBC: 4.1 MIL/uL (ref 3.87–5.11)
RDW: 13 % (ref 11.5–15.5)
WBC: 7.5 10*3/uL (ref 4.0–10.5)
nRBC: 0 % (ref 0.0–0.2)

## 2020-05-07 LAB — D-DIMER, QUANTITATIVE: D-Dimer, Quant: 0.47 ug/mL-FEU (ref 0.00–0.50)

## 2020-05-07 MED ORDER — ALUM & MAG HYDROXIDE-SIMETH 200-200-20 MG/5ML PO SUSP
30.0000 mL | Freq: Once | ORAL | Status: AC
  Administered 2020-05-07: 30 mL via ORAL
  Filled 2020-05-07: qty 30

## 2020-05-07 MED ORDER — LIDOCAINE VISCOUS HCL 2 % MT SOLN
15.0000 mL | Freq: Once | OROMUCOSAL | Status: AC
  Administered 2020-05-07: 15 mL via ORAL
  Filled 2020-05-07: qty 15

## 2020-05-07 MED ORDER — ONDANSETRON HCL 4 MG/2ML IJ SOLN
4.0000 mg | Freq: Once | INTRAMUSCULAR | Status: AC
  Administered 2020-05-07: 4 mg via INTRAVENOUS
  Filled 2020-05-07: qty 2

## 2020-05-07 MED ORDER — FENTANYL CITRATE (PF) 100 MCG/2ML IJ SOLN
50.0000 ug | Freq: Once | INTRAMUSCULAR | Status: AC
  Administered 2020-05-07: 50 ug via INTRAVENOUS
  Filled 2020-05-07: qty 2

## 2020-05-07 NOTE — ED Triage Notes (Signed)
Patient brought in by [facility] for chest pain and shortness of breath. EMS states during transport patient had nausea and vomiting. Patient was given 4 mg's of Zofran IV and one nitro during transport.

## 2020-05-07 NOTE — ED Provider Notes (Signed)
[facility] EMERGENCY DEPARTMENT Provider Note   CSN: [id number] Arrival date & time: 05/07/20  2232     History Chief Complaint  Patient presents with  . Chest Pain    [name] is a 69 y.o. female.  Patient with history of COPD on home oxygen, gastric ulcer, IBS, seizures presenting with episode of epigastric pain (not chest pain) that onset about 8 PM while she was watching television. Pain is constant and constant for the past 3 hours. Associated with one episode of nausea and vomiting. Pain is starting to improve and receive nitroglycerin by EMS. Pain is in the center of her epigastrium and does not radiate. Associated with nausea and vomiting. Shortness of breath is at baseline. No fever. No cough. Previous cholecystectomy. No pain with urination or blood in the urine. She does not think she is ever had this pain before. Does have a history of gastric ulcers. States she does not use alcohol anymore. Does not think she is ever had this pain before. Denies any cardiac history.  The history is provided by the patient and the EMS personnel.       Past Medical History:  Diagnosis Date  . Alcohol abuse, in remission   . Anxiety   . Bipolar disorder ([place])   . Colitis   . COPD (chronic obstructive pulmonary disease) ([place])    occasional home O2  . Gastric ulcer   . Hemorrhoids   . IBS (irritable bowel syndrome)   . Seizures ([place])    last seizure was about a year ago    Patient Active Problem List   Diagnosis Date Noted  . S/P right hip fracture   . Acute blood loss as cause of postoperative anemia   . Closed fracture of right hip ([place]) s/p compression fixation 04/25/19 04/24/2019  . Leukocytosis 04/24/2019  . COPD (chronic obstructive pulmonary disease) ([place]) 05/30/2017  . Colitis 08/21/2016  . Adrenal mass, left ([place]) 08/21/2016  . Solitary pulmonary nodule 08/21/2016  . Tobacco use disorder 08/21/2016  . GERD (gastroesophageal reflux disease) 06/13/2010  .  IRRITABLE BOWEL SYNDROME 06/13/2010  . HEMATOCHEZIA 06/13/2010  . WEIGHT LOSS, ABNORMAL 06/13/2010  . ABDOMINAL PAIN, GENERALIZED 06/13/2010    Past Surgical History:  Procedure Laterality Date  . ABDOMINAL HYSTERECTOMY    . CHOLECYSTECTOMY    . COMPRESSION HIP SCREW Right 04/25/2019   Procedure: COMPRESSION HIP;  Surgeon: Carole Civil, MD;  Location: [facility];  Service: Orthopedics;  Laterality: Right;  1030am  . EYE SURGERY       OB History    Gravida  4   Para  4   Term  4   Preterm      AB      Living  3     SAB      TAB      Ectopic      Multiple      Live Births              Family History  Problem Relation Age of Onset  . Dementia Father     Social History   Tobacco Use  . Smoking status: Current Every Day Smoker    Packs/day: 1.00    Years: 32.00    Pack years: 32.00    Types: Cigarettes  . Smokeless tobacco: Never Used  Substance Use Topics  . Alcohol use: No    Comment: h/o heavy use, quit in 1995  . Drug use: Yes  Types: Marijuana    Home Medications Prior to Admission medications   Medication Sig Start Date End Date Taking? Authorizing Provider  albuterol (PROVENTIL HFA;VENTOLIN HFA) 108 (90 BASE) MCG/ACT inhaler Inhale 2 puffs into the lungs every 6 (six) hours as needed for wheezing or shortness of breath.     [provider]  albuterol (PROVENTIL) (2.5 MG/3ML) 0.083% nebulizer solution Take 2.5 mg by nebulization every 6 (six) hours as needed for wheezing or shortness of breath.     [provider]  aspirin EC 325 MG EC tablet Take 1 tablet (325 mg total) by mouth daily with breakfast. 04/27/19   Barton Dubois, MD  atorvastatin (LIPITOR) 20 MG tablet Take 1 tablet by mouth every evening.  01/08/19   [provider]  cholecalciferol (VITAMIN D3) 25 MCG (1000 UT) tablet Take 1,000 Units by mouth daily.    [provider]  clonazePAM (KLONOPIN) 1 MG tablet Take 1 tablet (1 mg total) by mouth  3 (three) times daily. 04/26/19   Barton Dubois, MD  dicyclomine (BENTYL) 10 MG capsule Take 10 mg by mouth 2 (two) times daily.    [provider]  iron polysaccharides (NIFEREX) 150 MG capsule Take 1 capsule (150 mg total) by mouth daily. 04/26/19   Barton Dubois, MD  mirtazapine (REMERON) 30 MG tablet Take 1 tablet (30 mg total) by mouth at bedtime. 04/26/19   Barton Dubois, MD  nicotine (NICODERM CQ - DOSED IN MG/24 HOURS) 14 mg/24hr patch Place 1 patch (14 mg total) onto the skin daily. 04/27/19   Barton Dubois, MD  omeprazole (PRILOSEC) 20 MG capsule Take 1 capsule by mouth daily. 10/04/16   [provider]  ondansetron (ZOFRAN) 4 MG tablet Take 4 mg by mouth every 8 (eight) hours as needed for nausea.  09/02/18   [provider]  phenytoin (DILANTIN) 100 MG ER capsule Take 400 mg by mouth at bedtime.     [provider]  polyethylene glycol (MIRALAX) 17 g packet Take 17 g by mouth daily as needed for mild constipation. 04/26/19   Barton Dubois, MD  QUEtiapine (SEROQUEL) 25 MG tablet Take 1 tablet (25 mg total) by mouth 3 (three) times daily. 04/26/19   Barton Dubois, MD  tiZANidine (ZANAFLEX) 4 MG tablet Take 4 mg by mouth every 8 (eight) hours as needed for muscle spasms.  08/22/18   [provider]  traMADol-acetaminophen (ULTRACET) 37.5-325 MG tablet Take 1 tablet by mouth every 4 (four) hours as needed. 06/24/19   Carole Civil, MD  traZODone (DESYREL) 100 MG tablet Take 1 tablet by mouth every evening. 04/09/19   [provider]  zolpidem (AMBIEN) 10 MG tablet Take 1 tablet (10 mg total) by mouth at bedtime as needed for sleep. 04/26/19   Barton Dubois, MD    Allergies    Chantix [varenicline], Codeine, Flu virus vaccine, and Lithium  Review of Systems   Review of Systems  Constitutional: Positive for activity change and appetite change. Negative for fatigue and fever.  HENT: Negative for congestion and rhinorrhea.    Respiratory: Negative for cough, chest tightness and shortness of breath.   Cardiovascular: Negative for chest pain.  Gastrointestinal: Positive for abdominal pain, nausea and vomiting.  Genitourinary: Negative for dysuria and hematuria.  Musculoskeletal: Negative for arthralgias, back pain and myalgias.  Neurological: Negative for dizziness, weakness and headaches.   all other systems are negative except as noted in the HPI and PMH.    Physical Exam Updated  Vital Signs BP (!) 156/71 (BP Location: Left Arm)   Pulse 62   Temp 98.1 F (36.7 C) (Oral)   Resp 18   Ht 5' 3.5" (1.613 m)   Wt 62.6 kg   SpO2 99%   BMI 24.06 kg/m   Physical Exam Vitals and nursing note reviewed.  Constitutional:      General: She is not in acute distress.    Appearance: Normal appearance. She is well-developed and normal weight.     Comments: Chronically ill-appearing  HENT:     Head: Normocephalic and atraumatic.     Mouth/Throat:     Pharynx: No oropharyngeal exudate.  Eyes:     Conjunctiva/sclera: Conjunctivae normal.     Pupils: Pupils are equal, round, and reactive to light.  Neck:     Comments: No meningismus. Cardiovascular:     Rate and Rhythm: Normal rate and regular rhythm.     Heart sounds: Normal heart sounds. No murmur heard.   Pulmonary:     Effort: Pulmonary effort is normal. No respiratory distress.     Breath sounds: Normal breath sounds.  Chest:     Chest wall: No tenderness.  Abdominal:     Palpations: Abdomen is soft.     Tenderness: There is abdominal tenderness. There is guarding. There is no rebound.     Comments: Epigastric tenderness with voluntary guarding, no lower abdominal tenderness  Musculoskeletal:        General: No tenderness. Normal range of motion.     Cervical back: Normal range of motion and neck supple.  Skin:    General: Skin is warm.     Capillary Refill: Capillary refill takes less than 2 seconds.  Neurological:     General: No focal deficit  present.     Mental Status: She is alert and oriented to person, place, and time. Mental status is at baseline.     Cranial Nerves: No cranial nerve deficit.     Motor: No abnormal muscle tone.     Coordination: Coordination normal.     Comments:  5/5 strength throughout. CN 2-12 intact.Equal grip strength.   Psychiatric:        Behavior: Behavior normal.     ED Results / Procedures / Treatments   Labs (all labs ordered are listed, but only abnormal results are displayed) Labs Reviewed  BASIC METABOLIC PANEL - Abnormal; Notable for the following components:      Result Value   Sodium 133 (*)    Glucose, Bld 122 (*)    All other components within normal limits  CBC  HEPATIC FUNCTION PANEL  LIPASE, BLOOD  D-DIMER, QUANTITATIVE (NOT AT [facility])  LACTIC ACID, PLASMA  LACTIC ACID, PLASMA  TROPONIN I (HIGH SENSITIVITY)  TROPONIN I (HIGH SENSITIVITY)  TROPONIN I (HIGH SENSITIVITY)  TROPONIN I (HIGH SENSITIVITY)    EKG None  Radiology DG Chest Portable 1 View  Result Date: 05/08/2020 CLINICAL DATA:  Chest pain and shortness of breath. EXAM: PORTABLE CHEST 1 VIEW COMPARISON:  Mar 12, 2018 FINDINGS: The lungs are hyperinflated. Mild, diffuse chronic appearing increased interstitial lung markings are seen. There is no evidence of acute infiltrate, pleural effusion or pneumothorax. The heart size and mediastinal contours are within normal limits. The visualized skeletal structures are unremarkable. IMPRESSION: No active disease. Electronically Signed   By: Virgina Norfolk M.D.   On: 05/08/2020 00:28   CT Angio Chest/Abd/Pel for Dissection W and/or Wo Contrast  Result Date: 05/08/2020 CLINICAL DATA:  Chest  pain and shortness of breath, nausea and vomiting EXAM: CT ANGIOGRAPHY CHEST, ABDOMEN AND PELVIS TECHNIQUE: Non-contrast CT of the chest was initially obtained. Multidetector CT imaging through the chest, abdomen and pelvis was performed using the standard protocol during bolus  administration of intravenous contrast. Multiplanar reconstructed images and MIPs were obtained and reviewed to evaluate the vascular anatomy. CONTRAST:  54mL OMNIPAQUE IOHEXOL 350 MG/ML SOLN COMPARISON:  CT abdomen pelvis 05/21/2017 FINDINGS: CTA CHEST FINDINGS Cardiovascular: A noncontrast CT examination of the chest was performed initially demonstrating a normal caliber thoracic aorta with atherosclerotic plaque. No abnormal hyperdense mural thickening or plaque displacement to suggest intramural hematoma. Postcontrast administration there is satisfactory preferential opacification of the thoracic aorta. The aortic root is suboptimally assessed given cardiac pulsation artifact. Redemonstrated calcified noncalcified atheromatous plaque in the aorta. No acute luminal abnormality, specifically no evidence of dissection. No periaortic stranding or hemorrhage. Normal 3 vessel branching of the aortic arch with minimal plaque in the proximal great vessels which are otherwise normally opacified and unremarkable. Central pulmonary arteries are normal caliber though suboptimally opacified for luminal evaluation. Normal heart size. No pericardial effusion. Coronary artery calcifications are present. Mediastinum/Nodes: No mediastinal fluid or gas. Normal thyroid gland and thoracic inlet. No acute abnormality of the trachea or esophagus. No worrisome mediastinal, hilar or axillary adenopathy. Lungs/Pleura: Centrilobular and mild paraseptal emphysematous changes noted in the lungs. Diffuse airways thickening and scattered secretions are present suggesting some bronchitic features as well similar to comparison. No consolidation, features of edema, pneumothorax, or effusion. Bandlike opacities towards the lung bases and in the right middle lobe and lingula likely reflecting some chronic scarring and or atelectasis. Musculoskeletal: Slightly exaggerated thoracic kyphosis. Dextrocurvature of the thoracic spine and levocurvature of  the thoracolumbar spine are similar to priors. No acute or suspicious osseous lesions. No worrisome chest wall abnormalities. Review of the MIP images confirms the above findings. CTA ABDOMEN AND PELVIS FINDINGS VASCULAR Aorta: Atherosclerotic plaque throughout the abdominal aorta without significant stenosis, aneurysm or ectasia. No acute luminal abnormality. No periaortic stranding or hemorrhage or features of vasculitis. Celiac: Ostial plaque resulting in mild ostial narrowing. Vessel otherwise opacified normally. No evidence of aneurysm, dissection or vasculitis. SMA: Ostial plaque resulting in some mild to moderate narrowing. Assaulted wise normally opacified with minimal additional plaque and no other significant stenosis. No evidence of aneurysm, dissection or vasculitis. Renals: Single renal arteries bilaterally. Minimal plaque at the left renal artery origin resulting in mild narrowing. No other significant renal artery stenosis. No evidence of aneurysm, dissection, vasculitis or fibromuscular dysplasia. IMA: Narrowing just beyond the ostium of the IMA origin with short segmental occlusion and more distal opacification likely supplied via left colic collaterals. No acute or other significant vascular abnormality. Inflow: Atherosclerotic plaque throughout the common, internal and external iliac arteries. Resulting in at most mild luminal narrowing the common iliacs and internal branches. Vessel is otherwise normally opacified. No evidence of aneurysm, dissection or vasculitis. Veins: No obvious venous abnormality within the limitations of this arterial phase study. Review of the MIP images confirms the above findings. NON-VASCULAR Hepatobiliary: Diffuse hepatic hypoattenuation compatible with hepatic steatosis. Benign capsular calcification towards the dome of the liver (5/94) no worrisome focal liver lesions. Smooth surface contour. Post cholecystectomy. Prominence of the biliary tree likely related to age  and post cholecystectomy reservoir effect without visible calcified intraductal gallstones. Degree of distention is similar to comparison Pancreas: Mild pancreatic atrophy. No inflammation or ductal dilatation. No discernible lesions. Spleen: Heterogeneous enhancement compatible with  arterial phase of imaging. Normal in size without focal abnormality. Adrenals/Urinary Tract: Redemonstration of a 3.5 x 2.6 cm left adrenal mass previously characterized as adrenal adenoma by washout characteristics. No new or concerning adrenal lesions. Kidneys enhance symmetrically and uniformly and appear normally positioned. No concerning renal lesions. No urolithiasis or hydronephrosis. Urinary bladder is largely decompressed at the time of exam and therefore poorly evaluated by CT imaging. No gross bladder abnormality. Stomach/Bowel: The stomach is unremarkable. Few air-filled duodenal diverticula near the head of the pancreas similar to prior. Duodenum takes a normal sweep across the midline abdomen. No small bowel thickening or dilatation. No evidence of bowel obstruction. Appendix is not visualized. No focal inflammation the vicinity of the cecum to suggest an occult appendicitis. No colonic dilatation or wall thickening. Lymphatic: No suspicious or enlarged lymph nodes in the included lymphatic chains. Reproductive: Uterus is surgically absent. No concerning adnexal lesions. Other: No abdominopelvic free fluid or free gas. No bowel containing hernias. Postsurgical changes of the anterior ventral midline. Musculoskeletal: Multilevel degenerative changes are present in the imaged portions of the spine. Grade 1 anterolisthesis L4 on 5 without spondylolysis is similar to comparison. Prior right femoral transcervical fixation with lateral plate and screw construct. No acute hardware complication. No other acute or suspicious osseous lesions. Review of the MIP images confirms the above findings. IMPRESSION: 1. No evidence of acute  aortic syndrome. 2. Suboptimal opacification of the pulmonary arteries for assessment of pulmonary artery emboli. 3.  Aortic Atherosclerosis (ICD10-I70.0). 4. 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 5. Mild stenosis at the celiac axis, SMA and left renal artery ostia. 6. No acute intrathoracic or abdominopelvic abnormalities. 7. Features of bronchitis and emphysema (ICD10-J43.9). 8. Hepatic steatosis. 9. Stable left adrenal adenoma. 10. Grade 1 anterolisthesis L4 on L5, unchanged. 11. Prior fixation of the right femoral neck without hardware complication. Electronically Signed   By: Lovena Le M.D.   On: 05/08/2020 02:00    Procedures Procedures (including critical care time)  Medications Ordered in ED Medications - No data to display  ED Course  I have reviewed the triage vital signs and the nursing notes.  Pertinent labs & imaging results that were available during my care of the patient were reviewed by me and considered in my medical decision making (see chart for details).    MDM Rules/Calculators/A&P                         Epigastric pain with nausea and vomiting. EKG is sinus rhythm with inferior Q waves that are unchanged.  LFTs and lipase are normal.  Troponin is negative. Patient does have a history of ulcers by her report.  No EGD in the system.  No recent black or bloody stools.  D-dimer is negative.  Troponin negative x2.  Low suspicion for ACS  CT scan shows no evidence of aortic dissection or pulmonary embolism. CTA does show some evidence of some IMA occlusion with distal opacification from collaterals.  Celiac is mildly stenosed.  Discussed with vascular surgery Dr. Trula Slade who states this is unlikely etiology of her acute epigastric pain.  Does not recommend any specific follow-up.  No evidence of necrotic bowel.  Lactate is normal.  Patient states her pain is resolved after GI cocktail. Her troponins  remain negative. Low suspicion for ACS.  Continue PPI.  Avoid alcohol, caffeine, NSAIDs, spicy foods. Follow-up with PCP.  Will refer  to gastroenterology as well for EGD.  Return precautions discussed.  ED ECG REPORT   Date: 05/07/2020  Rate: 62  Rhythm: normal sinus rhythm  QRS Axis: normal  Intervals: normal  ST/T Wave abnormalities: normal  Conduction Disutrbances:none  Narrative Interpretation:   Old EKG Reviewed: unchanged  I have personally reviewed the EKG tracing and agree with the computerized printout as noted.  Final Clinical Impression(s) / ED Diagnoses Final diagnoses:  Epigastric pain    Rx / DC Orders ED Discharge Orders    None       Caelan Branden, Annie Main, MD 05/08/20 [phone]

## 2020-05-08 ENCOUNTER — Emergency Department (HOSPITAL_COMMUNITY): Payer: Medicare Other

## 2020-05-08 DIAGNOSIS — R079 Chest pain, unspecified: Secondary | ICD-10-CM | POA: Diagnosis not present

## 2020-05-08 DIAGNOSIS — I708 Atherosclerosis of other arteries: Secondary | ICD-10-CM | POA: Diagnosis not present

## 2020-05-08 DIAGNOSIS — I701 Atherosclerosis of renal artery: Secondary | ICD-10-CM | POA: Diagnosis not present

## 2020-05-08 DIAGNOSIS — R0602 Shortness of breath: Secondary | ICD-10-CM | POA: Diagnosis not present

## 2020-05-08 DIAGNOSIS — I774 Celiac artery compression syndrome: Secondary | ICD-10-CM | POA: Diagnosis not present

## 2020-05-08 LAB — TROPONIN I (HIGH SENSITIVITY)
Troponin I (High Sensitivity): 3 ng/L (ref <18)
Troponin I (High Sensitivity): 3 ng/L (ref <18)
Troponin I (High Sensitivity): 4 ng/L (ref <18)

## 2020-05-08 LAB — LACTIC ACID, PLASMA
Lactic Acid, Venous: 0.9 mmol/L (ref 0.5–1.9)
Lactic Acid, Venous: 0.7 mmol/L (ref 0.5–1.9)

## 2020-05-08 MED ORDER — IOHEXOL 350 MG/ML SOLN
80.0000 mL | Freq: Once | INTRAVENOUS | Status: AC | PRN
  Administered 2020-05-08: 80 mL via INTRAVENOUS

## 2020-05-08 MED ORDER — LORAZEPAM 2 MG/ML IJ SOLN
0.5000 mg | Freq: Once | INTRAMUSCULAR | Status: AC
  Administered 2020-05-08: 0.5 mg via INTRAVENOUS
  Filled 2020-05-08: qty 1

## 2020-05-08 NOTE — Discharge Instructions (Addendum)
Your testing is negative for heart attack or blood clot in the lung.  Continue to take your stomach medication omeprazole.  Avoid alcohol, caffeine, NSAID medications, spicy food. Return to the ED if you develop exertional chest pain, shortness of breath, nausea, vomiting, sweating or any other concerns.

## 2020-05-13 DIAGNOSIS — R1310 Dysphagia, unspecified: Secondary | ICD-10-CM | POA: Diagnosis not present

## 2020-05-13 DIAGNOSIS — Z79899 Other long term (current) drug therapy: Secondary | ICD-10-CM | POA: Diagnosis not present

## 2020-05-13 DIAGNOSIS — E559 Vitamin D deficiency, unspecified: Secondary | ICD-10-CM | POA: Diagnosis not present

## 2020-05-13 DIAGNOSIS — M549 Dorsalgia, unspecified: Secondary | ICD-10-CM | POA: Diagnosis not present

## 2020-05-13 DIAGNOSIS — K222 Esophageal obstruction: Secondary | ICD-10-CM | POA: Diagnosis not present

## 2020-05-13 DIAGNOSIS — J449 Chronic obstructive pulmonary disease, unspecified: Secondary | ICD-10-CM | POA: Diagnosis not present

## 2020-05-13 DIAGNOSIS — R413 Other amnesia: Secondary | ICD-10-CM | POA: Diagnosis not present

## 2020-05-13 DIAGNOSIS — Z23 Encounter for immunization: Secondary | ICD-10-CM | POA: Diagnosis not present

## 2020-05-13 DIAGNOSIS — R0789 Other chest pain: Secondary | ICD-10-CM | POA: Diagnosis not present

## 2020-05-13 DIAGNOSIS — Z6824 Body mass index (BMI) 24.0-24.9, adult: Secondary | ICD-10-CM | POA: Diagnosis not present

## 2020-05-19 ENCOUNTER — Encounter: Payer: Self-pay | Admitting: Internal Medicine

## 2020-05-19 DIAGNOSIS — Z6824 Body mass index (BMI) 24.0-24.9, adult: Secondary | ICD-10-CM | POA: Diagnosis not present

## 2020-05-31 ENCOUNTER — Other Ambulatory Visit: Payer: Self-pay

## 2020-05-31 ENCOUNTER — Ambulatory Visit (HOSPITAL_COMMUNITY)
Admission: RE | Admit: 2020-05-31 | Discharge: 2020-05-31 | Disposition: A | Discharge status: Home / Self Care | Payer: Medicare Other | Source: Ambulatory Visit | Attending: Internal Medicine | Admitting: Internal Medicine

## 2020-05-31 ENCOUNTER — Other Ambulatory Visit (HOSPITAL_COMMUNITY): Payer: Self-pay | Admitting: Internal Medicine

## 2020-05-31 DIAGNOSIS — N6312 Unspecified lump in the right breast, upper inner quadrant: Secondary | ICD-10-CM | POA: Diagnosis not present

## 2020-05-31 DIAGNOSIS — M810 Age-related osteoporosis without current pathological fracture: Secondary | ICD-10-CM | POA: Insufficient documentation

## 2020-05-31 DIAGNOSIS — Z780 Asymptomatic menopausal state: Secondary | ICD-10-CM | POA: Diagnosis not present

## 2020-05-31 DIAGNOSIS — E2839 Other primary ovarian failure: Secondary | ICD-10-CM | POA: Diagnosis present

## 2020-05-31 DIAGNOSIS — N63 Unspecified lump in unspecified breast: Secondary | ICD-10-CM

## 2020-05-31 DIAGNOSIS — N6310 Unspecified lump in the right breast, unspecified quadrant: Secondary | ICD-10-CM

## 2020-05-31 DIAGNOSIS — R928 Other abnormal and inconclusive findings on diagnostic imaging of breast: Secondary | ICD-10-CM | POA: Diagnosis not present

## 2020-06-07 ENCOUNTER — Ambulatory Visit (HOSPITAL_COMMUNITY)
Admission: RE | Admit: 2020-06-07 | Discharge: 2020-06-07 | Disposition: A | Discharge status: Home / Self Care | Payer: Medicare Other | Source: Ambulatory Visit | Attending: Internal Medicine | Admitting: Internal Medicine

## 2020-06-07 ENCOUNTER — Other Ambulatory Visit: Payer: Self-pay

## 2020-06-07 ENCOUNTER — Other Ambulatory Visit (HOSPITAL_COMMUNITY): Payer: Self-pay | Admitting: Internal Medicine

## 2020-06-07 DIAGNOSIS — C50211 Malignant neoplasm of upper-inner quadrant of right female breast: Secondary | ICD-10-CM | POA: Diagnosis not present

## 2020-06-07 DIAGNOSIS — R928 Other abnormal and inconclusive findings on diagnostic imaging of breast: Secondary | ICD-10-CM

## 2020-06-07 DIAGNOSIS — N6312 Unspecified lump in the right breast, upper inner quadrant: Secondary | ICD-10-CM | POA: Diagnosis not present

## 2020-06-07 DIAGNOSIS — Z170 Estrogen receptor positive status [ER+]: Secondary | ICD-10-CM | POA: Diagnosis not present

## 2020-06-07 DIAGNOSIS — N6310 Unspecified lump in the right breast, unspecified quadrant: Secondary | ICD-10-CM | POA: Insufficient documentation

## 2020-06-07 MED ORDER — LIDOCAINE-EPINEPHRINE (PF) 1 %-1:200000 IJ SOLN
INTRAMUSCULAR | Status: AC
  Filled 2020-06-07: qty 30

## 2020-06-07 MED ORDER — LIDOCAINE HCL (PF) 2 % IJ SOLN
INTRAMUSCULAR | Status: AC
  Filled 2020-06-07: qty 10

## 2020-06-07 MED ORDER — SODIUM BICARBONATE 4.2 % IV SOLN
INTRAVENOUS | Status: AC
  Filled 2020-06-07: qty 10

## 2020-06-09 LAB — SURGICAL PATHOLOGY

## 2020-06-14 ENCOUNTER — Other Ambulatory Visit: Payer: Self-pay

## 2020-06-14 ENCOUNTER — Ambulatory Visit (INDEPENDENT_AMBULATORY_CARE_PROVIDER_SITE_OTHER): Payer: Medicare Other | Admitting: General Surgery

## 2020-06-14 ENCOUNTER — Encounter: Payer: Self-pay | Admitting: General Surgery

## 2020-06-14 VITALS — BP 137/76 | HR 84 | Temp 98.9°F | Resp 12 | Ht 62.5 in | Wt 136.0 lb

## 2020-06-14 DIAGNOSIS — C50211 Malignant neoplasm of upper-inner quadrant of right female breast: Secondary | ICD-10-CM

## 2020-06-14 NOTE — Patient Instructions (Signed)
° °Breast Cancer, Female ° °Breast cancer is a malignant growth of tissue (tumor) in the breast. Unlike noncancerous (benign) tumors, malignant tumors are cancerous and can spread to other parts of the body. The two most common types of breast cancer start in the milk ducts (ductal carcinoma) or in the lobules where milk is made in the breast (lobular carcinoma). Breast cancer is one of the most common types of cancer in women. °What are the causes? °The exact cause of female breast cancer is unknown. °What increases the risk? °The following factors may make you more likely to develop this condition: °· Being older than 70 years of age. °· Race and ethnicity. Caucasian women generally have an increased risk, but African-American women are more likely to develop the disease before age 45. °· Having a family history of breast cancer. °· Having had breast cancer in the past. °· Having certain noncancerous conditions of the breast, such as dense breast tissue. °· Having the BRCA1 and BRCA2 genes. °· Having a history of radiation exposure. °· Obesity. °· Starting menopause after age 55. °· Starting your menstrual periods before age 12. °· Having never been pregnant or having your first child after age 30. °· Having never breastfed. °· Using hormone therapy after menopause. °· Using birth control pills. °· Drinking more than one alcoholic drink a day. °· Exposure to the drug DES, which was given to pregnant women from the 1940s to the 1970s. °What are the signs or symptoms? °Symptoms of this condition include: °· A painless lump or thickening in your breast. °· Changes in the size or shape of your breast. °· Breast skin changes, such as puckering or dimpling. °· Nipple abnormalities, such as scaling, crustiness, redness, or pulling in (retraction). °· Nipple discharge that is bloody or clear. °How is this diagnosed? °This condition may be diagnosed by: °· Taking your medical history and doing a physical exam. During the  exam, your health care provider will feel the tissue around your breast and under your arms. °· Taking a sample of nipple discharge. The sample will be examined under a microscope. °· Performing imaging tests, such as breast X-rays (mammogram), breast ultrasound exams, or an MRI. °· Taking a tissue sample (biopsy) from the breast. The sample will be examined under a microscope to look for cancer cells. °· Taking a sample from the lymph nodes near the affected breast (sentinel node biopsy). °Your cancer will be staged to determine its severity and extent. Staging is a careful attempt to find out the size of the tumor, whether the cancer has spread, and if so, to what parts of the body. Staging also includes testing your tumor for certain receptors, such as estrogen, progesterone, and human epidermal growth factor receptor 2 (HER2). This will help your cancer care team decide on a treatment that will work best for you. You may need to have more tests to determine the stage of your cancer. Stages include the following: °· Stage 0--The tumor has not spread to other breast tissue. °· Stage I--The cancer is only found in the breast or may be in the lymph nodes. The tumor may be up to ¾ in (2 cm) wide. °· Stage II--The cancer has spread to nearby lymph nodes. The tumor may be up to 2 in (5 cm) wide. °· Stage III--The cancer has spread to more distant lymph nodes. The tumor may be larger than 2 in (5 cm) wide. °· Stage IV--The cancer has spread to other parts   the body, such as the bones, brain, liver, or lungs. How is this treated? Treatment for this condition depends on the type and stage of the breast cancer. It may be treated with:  Surgery. This may involve breast-conserving surgery (lumpectomy or partial mastectomy) in which only the part of the breast containing the cancer is removed. Some normal tissue surrounding this area may also be removed. In some cases, surgery may be done to remove the entire breast  (mastectomy) and nipple. Lymph nodes may also be removed.  Radiation therapy, which uses high-energy rays to kill cancer cells.  Chemotherapy, which is the use of drugs to kill cancer cells.  Hormone therapy, which involves taking medicine to adjust the hormone levels in your body. You may take medicine to decrease your estrogen levels. This can help stop cancer cells from growing.  Targeted therapy, in which drugs are used to block the growth and spread of cancer cells. These drugs target a specific part of the cancer cell and usually cause fewer side effects than chemotherapy. Targeted therapy may be used alone or in combination with chemotherapy.  A combination of surgery, radiation, chemotherapy, or hormone therapy may be needed to treat breast cancer. Follow these instructions at home:  Take over-the-counter and prescription medicines only as told by your health care provider.  Eat a healthy diet. A healthy diet includes lots of fruits and vegetables, low-fat dairy products, lean meats, and fiber. ? Make sure half your plate is filled with fruits or vegetables. ? Choose high-fiber foods such as whole-grain breads and cereals.  Consider joining a support group. This may help you learn to cope with the stress of having breast cancer.  Talk to your health care team about exercise and physical activity. The right exercise program can: ? Help prevent or reduce symptoms such as fatigue or depression. ? Improve overall health and survival rates.  Keep all follow-up visits as told by your health care provider. This is important. Where to find more information  American Cancer Society: www.cancer.[place]: www.cancer.gov Contact a health care provider if:  You have a sudden increase in pain.  You have any symptoms or changes that concern you.  You lose weight without trying.  You notice a new lump in either breast or under your arm.  You develop swelling in  either arm or hand.  You have a fever.  You notice new fatigue or weakness. Get help right away if:  You have chest pain or trouble breathing.  You faint. Summary  Breast cancer is a malignant growth of tissue (tumor) in the breast.  Your cancer will be staged to determine its severity and extent.  Treatment for this condition depends on the type and stage of the breast cancer. This information is not intended to replace advice given to you by your health care provider. Make sure you discuss any questions you have with your health care provider. Document Revised: 09/27/2017 Document Reviewed: 06/10/2017 Elsevier Patient Education  [place].

## 2020-06-15 NOTE — Progress Notes (Signed)
[name]; [id number]; December 07, 1950   HPI Patient is a 69 year old white female who was referred to my care by Dr. Gerarda Fraction for evaluation treatment of newly diagnosed right breast cancer.  This was found on routine mammography.  Is biopsy positive for invasive carcinoma.  Patient states she is always had lumpy bumpy breast.  It has been approximately 3 years since she had her last screening mammogram.  There is no family history of breast cancer.  She denies any breast pain or nipple discharge. Past Medical History:  Diagnosis Date  . Alcohol abuse, in remission   . Anxiety   . Bipolar disorder ([place])   . Colitis   . COPD (chronic obstructive pulmonary disease) ([place])    occasional home O2  . Gastric ulcer   . Hemorrhoids   . IBS (irritable bowel syndrome)   . Seizures ([place])    last seizure was about a year ago    Past Surgical History:  Procedure Laterality Date  . ABDOMINAL HYSTERECTOMY    . CHOLECYSTECTOMY    . COMPRESSION HIP SCREW Right 04/25/2019   Procedure: COMPRESSION HIP;  Surgeon: Carole Civil, MD;  Location: [facility];  Service: Orthopedics;  Laterality: Right;  1030am  . EYE SURGERY      Family History  Problem Relation Age of Onset  . Dementia Father     Current Outpatient Medications on File Prior to Visit  Medication Sig Dispense Refill  . albuterol (PROVENTIL HFA;VENTOLIN HFA) 108 (90 BASE) MCG/ACT inhaler Inhale 2 puffs into the lungs every 6 (six) hours as needed for wheezing or shortness of breath.     Marland Kitchen albuterol (PROVENTIL) (2.5 MG/3ML) 0.083% nebulizer solution Take 2.5 mg by nebulization every 6 (six) hours as needed for wheezing or shortness of breath.     Marland Kitchen atorvastatin (LIPITOR) 20 MG tablet Take 1 tablet by mouth every evening.     . cholecalciferol (VITAMIN D3) 25 MCG (1000 UT) tablet Take 1,000 Units by mouth daily.    . clonazePAM (KLONOPIN) 1 MG tablet Take 1 tablet (1 mg total) by mouth 3 (three) times daily.    Marland Kitchen dicyclomine (BENTYL) 10 MG  capsule Take 10 mg by mouth 2 (two) times daily.    . mirtazapine (REMERON) 30 MG tablet Take 1 tablet (30 mg total) by mouth at bedtime. 30 tablet 1  . nicotine (NICODERM CQ - DOSED IN MG/24 HOURS) 14 mg/24hr patch Place 1 patch (14 mg total) onto the skin daily. 28 patch 0  . omeprazole (PRILOSEC) 20 MG capsule Take 1 capsule by mouth daily.    . phenytoin (DILANTIN) 100 MG ER capsule Take 400 mg by mouth at bedtime.     . polyethylene glycol (MIRALAX) 17 g packet Take 17 g by mouth daily as needed for mild constipation. 28 each 0  . tiZANidine (ZANAFLEX) 4 MG tablet Take 4 mg by mouth every 8 (eight) hours as needed for muscle spasms.     . traMADol-acetaminophen (ULTRACET) 37.5-325 MG tablet Take 1 tablet by mouth every 4 (four) hours as needed. 90 tablet 5  . traZODone (DESYREL) 100 MG tablet Take 1 tablet by mouth every evening.     No current facility-administered medications on file prior to visit.    Allergies  Allergen Reactions  . Chantix [Varenicline] Hives  . Codeine Nausea And Vomiting  . Flu Virus Vaccine Nausea And Vomiting  . Lithium Nausea And Vomiting    Social History   Substance and Sexual Activity  Alcohol Use No   Comment: h/o heavy use, quit in 1995    Social History   Tobacco Use  Smoking Status Current Every Day Smoker  . Packs/day: 1.00  . Years: 32.00  . Pack years: 32.00  . Types: Cigarettes  Smokeless Tobacco Never Used    Review of Systems  Constitutional: Positive for malaise/fatigue.  HENT: Positive for sinus pain.   Eyes: Negative.   Respiratory: Positive for shortness of breath.   Cardiovascular: Negative.   Gastrointestinal: Positive for nausea.  Genitourinary: Negative.   Musculoskeletal: Positive for joint pain.  Skin: Negative.   Neurological: Negative.   Endo/Heme/Allergies: Negative.   Psychiatric/Behavioral: Negative.     Objective   Vitals:   06/14/20 1123  BP: 137/76  Pulse: 84  Resp: 12  Temp: 98.9 F (37.2 C)   SpO2: 94%    Physical Exam Vitals reviewed.  Constitutional:      Appearance: Normal appearance. She is normal weight. She is not ill-appearing.  HENT:     Head: Normocephalic and atraumatic.  Cardiovascular:     Rate and Rhythm: Normal rate and regular rhythm.     Heart sounds: Normal heart sounds. No murmur heard.  No friction rub. No gallop.   Pulmonary:     Effort: No respiratory distress.     Breath sounds: Normal breath sounds. No stridor. No wheezing, rhonchi or rales.     Comments: Heart sounds distant. Lymphadenopathy:     Cervical: No cervical adenopathy.  Skin:    General: Skin is warm and dry.  Neurological:     Mental Status: She is alert.   Breast: There is a thickening dominant mass at the 2 o'clock position in the right breast, but this is not associated with the core biopsy site.  No nipple discharge is noted.  The axilla is negative for palpable nodes.  Left breast examination reveals no dominant mass, nipple discharge, or dimpling.  The axilla is negative for palpable nodes. Mammography and pathology reports reviewed Assessment  Newly diagnosed invasive ductal carcinoma of right breast, COPD with occasional home O2 use Plan   I discussed all options with the patient including a right modified radical mastectomy versus a right partial mastectomy, sentinel lymph node biopsy, and radiation therapy.  She is small breasted.  She would like to think about her options.  I will see her again in 1 week for follow-up.

## 2020-06-21 ENCOUNTER — Ambulatory Visit (INDEPENDENT_AMBULATORY_CARE_PROVIDER_SITE_OTHER): Payer: Medicare Other | Admitting: General Surgery

## 2020-06-21 ENCOUNTER — Encounter: Payer: Self-pay | Admitting: General Surgery

## 2020-06-21 ENCOUNTER — Other Ambulatory Visit: Payer: Self-pay

## 2020-06-21 VITALS — BP 130/68 | HR 88 | Temp 98.7°F | Resp 12 | Ht 63.5 in | Wt 140.0 lb

## 2020-06-21 DIAGNOSIS — C50412 Malignant neoplasm of upper-outer quadrant of left female breast: Secondary | ICD-10-CM

## 2020-06-21 DIAGNOSIS — C50411 Malignant neoplasm of upper-outer quadrant of right female breast: Secondary | ICD-10-CM | POA: Diagnosis not present

## 2020-06-21 NOTE — Patient Instructions (Signed)
Lumpectomy  A lumpectomy, sometimes called a partial mastectomy, is surgery to remove a cancerous tumor or mass (the lump) from a breast. It is a form of breast-conserving or breast-preservation surgery. This means that the cancerous tissue is removed but the breast remains intact. During a lumpectomy, the portion of the breast that contains the tumor is removed. Some normal tissue around the lump may be taken out to make sure that all of the tumor has been removed. Lymph nodes under your arm may also be removed and tested to find out if the cancer has spread. Lymph nodes are part of the body's disease-fighting system (immune system) and are usually the first place where breast cancer spreads. Tell a health care provider about:  Any allergies you have.  All medicines you are taking, including vitamins, herbs, eye drops, creams, and over-the-counter medicines.  Any problems you or family members have had with anesthetic medicines.  Any blood disorders you have.  Any surgeries you have had.  Any medical conditions you have.  Whether you are pregnant or may be pregnant. What are the risks? Generally, this is a safe procedure. However, problems may occur, including:  Bleeding.  Infection.  Allergic reaction to medicines.  Pain, swelling, weakness, or numbness in the arm on the side of your surgery.  Temporary swelling.  Change in the shape of the breast, particularly if a large portion is removed.  Scar tissue that forms at the surgical site and feels hard to the touch.  Blood clots. What happens before the procedure? Staying hydrated Follow instructions from your health care provider about hydration, which may include:  Up to 2 hours before the procedure - you may continue to drink clear liquids, such as water, clear fruit juice, black coffee, and plain tea.  Eating and drinking restrictions Follow instructions from your health care provider about eating and drinking, which  may include:  8 hours before the procedure - stop eating heavy meals or foods, such as meat, fried foods, or fatty foods.  6 hours before the procedure - stop eating light meals or foods, such as toast or cereal.  6 hours before the procedure - stop drinking milk or drinks that contain milk.  2 hours before the procedure - stop drinking clear liquids. Medicines Ask your health care provider about:  Changing or stopping your regular medicines. This is especially important if you are taking diabetes medicines or blood thinners.  Taking medicines such as aspirin and ibuprofen. These medicines can thin your blood. Do not take these medicines unless your health care provider tells you to take them.  Taking over-the-counter medicines, vitamins, herbs, and supplements. General instructions  Prior to surgery, your health care provider may do a procedure to locate and [name] the tumor area in your breast (localization). This will help guide your surgeon to where the incision will be made. This may be done with: ? Imaging, such as a mammogram, ultrasound, or MRI. ? Insertion of a small wire, clip, or seed, or an implant that will reflect a radar signal.  You may have screening tests or exams to get baseline measurements of your arm. These can be compared to measurements done after surgery to monitor for swelling (lymphedema) that can develop after having lymph nodes removed.  Ask your health care provider: ? How your surgery site will be marked. ? What steps will be taken to help prevent infection. These may include:  Washing skin with a germ-killing soap.  Taking antibiotic medicine.  Plan to have someone take you home from the hospital or clinic.  Plan to have a responsible adult care for you for at least 24 hours after you leave the hospital or clinic. This is important. What happens during the procedure?   An IV will be inserted into one of your veins.  You will be given one or more  of the following: ? A medicine to help you relax (sedative). ? A medicine to numb the area (local anesthetic). ? A medicine to make you fall asleep (general anesthetic).  Your health care provider will use a kind of electric scalpel that uses heat to reduce bleeding (electrocautery knife). A curved incision that follows the natural curve of your breast will be made. This type of incision will allow for minimal scarring and better healing.  The tumor will be removed along with some of the tissue around it. This will be sent to the lab for testing. Your health care provider may also remove lymph nodes at this time if needed.  If the tumor is close to the muscles over your chest, some muscle tissue may also be removed.  A small drain tube may be inserted into your breast area or armpit to collect fluid that may build up after surgery. This tube will be connected to a suction bulb on the outside of your body to remove the fluid.  The incision will be closed with stitches (sutures).  A bandage (dressing) may be placed over the incision. The procedure may vary among health care providers and hospitals. What happens after the procedure?  Your blood pressure, heart rate, breathing rate, and blood oxygen level will be monitored until you leave the hospital or clinic.  You will be given medicine for pain as needed.  Your IV will be removed when you are able to eat and drink by mouth.  You will be encouraged to get up and walk as soon as you can. This is important to improve blood flow and breathing. Ask for help if you feel weak or unsteady.  You may have: ? A drain tube in place for 2-3 days to prevent a collection of blood (hematoma) from developing in the breast. You will be given instructions about caring for the drain before you go home. ? A pressure bandage applied for 1-2 days to prevent bleeding or swelling. Your pressure bandage may look like a thick piece of fabric or an elastic wrap.  Ask your health care provider how to care for your bandage at home.  You may be given a tight sleeve to wear over your arm on the side of your surgery. You should wear this sleeve as told by your health care provider.  Do not drive for 24 hours if you were given a sedative during your procedure. Summary  A lumpectomy, sometimes called a partial mastectomy, is surgery to remove a cancerous tumor or mass (the lump) from a breast.  During a lumpectomy, the portion of the breast that contains the tumor is removed. Lymph nodes under your arm may also be removed and tested to find out if the cancer has spread.  Plan to have someone take you home from the hospital or clinic.  You may have a drain tube in place for 2-3 days to prevent a collection of blood (hematoma) from developing in the breast. You will be given instructions about caring for the drain before you go home. This information is not intended to replace advice given to you by  your health care provider. Make sure you discuss any questions you have with your health care provider. Document Revised: 04/20/2019 Document Reviewed: 04/20/2019 Elsevier Patient Education  [place].

## 2020-06-21 NOTE — Progress Notes (Signed)
Subjective:     [name]  Patient here to follow-up with her treatment plans for her right breast cancer.  She states she would like to proceed with a right partial mastectomy after needle localization, sentinel lymph node biopsy.  She states her breathing has been stable at her baseline. Objective:    BP 130/68   Pulse 88   Temp 98.7 F (37.1 C) (Oral)   Resp 12   Ht 5' 3.5" (1.613 m)   Wt 140 lb (63.5 kg)   SpO2 95%   BMI 24.41 kg/m   General:  alert, cooperative and no distress       Assessment:    Right breast carcinoma    Plan:   We will proceed with right partial mastectomy after needle localization, sentinel lymph node biopsy.  Will contact her to schedule the surgery.  The risks and benefits of the procedure including bleeding, infection, pain, and cardiopulmonary difficulties were fully explained to the patient, who gave informed consent.

## 2020-06-22 ENCOUNTER — Telehealth: Payer: Self-pay

## 2020-06-22 NOTE — Telephone Encounter (Signed)
Called and reassured patient regarding which breast(Right)  was to have the surgery on. Patient verbalized understanding. She will receive a phone call tomorrow to set up her surgery for the right breast.

## 2020-06-23 ENCOUNTER — Other Ambulatory Visit (HOSPITAL_COMMUNITY): Payer: Self-pay | Admitting: General Surgery

## 2020-06-23 DIAGNOSIS — C50911 Malignant neoplasm of unspecified site of right female breast: Secondary | ICD-10-CM

## 2020-06-23 DIAGNOSIS — C50411 Malignant neoplasm of upper-outer quadrant of right female breast: Secondary | ICD-10-CM

## 2020-06-27 NOTE — H&P (Signed)
[name]; [id number]; 1951/01/18   HPI Patient is a 69 year old white female who was referred to my care by Dr. Gerarda Fraction for evaluation treatment of newly diagnosed right breast cancer.  This was found on routine mammography.  Is biopsy positive for invasive carcinoma.  Patient states she is always had lumpy bumpy breast.  It has been approximately 3 years since she had her last screening mammogram.  There is no family history of breast cancer.  She denies any breast pain or nipple discharge. Past Medical History:  Diagnosis Date  . Alcohol abuse, in remission   . Anxiety   . Bipolar disorder ([place])   . Colitis   . COPD (chronic obstructive pulmonary disease) ([place])    occasional home O2  . Gastric ulcer   . Hemorrhoids   . IBS (irritable bowel syndrome)   . Seizures ([place])    last seizure was about a year ago    Past Surgical History:  Procedure Laterality Date  . ABDOMINAL HYSTERECTOMY    . CHOLECYSTECTOMY    . COMPRESSION HIP SCREW Right 04/25/2019   Procedure: COMPRESSION HIP;  Surgeon: Carole Civil, MD;  Location: [facility];  Service: Orthopedics;  Laterality: Right;  1030am  . EYE SURGERY      Family History  Problem Relation Age of Onset  . Dementia Father     Current Outpatient Medications on File Prior to Visit  Medication Sig Dispense Refill  . albuterol (PROVENTIL HFA;VENTOLIN HFA) 108 (90 BASE) MCG/ACT inhaler Inhale 2 puffs into the lungs every 6 (six) hours as needed for wheezing or shortness of breath.     Marland Kitchen albuterol (PROVENTIL) (2.5 MG/3ML) 0.083% nebulizer solution Take 2.5 mg by nebulization every 6 (six) hours as needed for wheezing or shortness of breath.     Marland Kitchen atorvastatin (LIPITOR) 20 MG tablet Take 1 tablet by mouth every evening.     . cholecalciferol (VITAMIN D3) 25 MCG (1000 UT) tablet Take 1,000 Units by mouth daily.    . clonazePAM (KLONOPIN) 1 MG tablet Take 1 tablet (1 mg total) by mouth 3 (three) times daily.    Marland Kitchen dicyclomine (BENTYL) 10 MG  capsule Take 10 mg by mouth 2 (two) times daily.    . mirtazapine (REMERON) 30 MG tablet Take 1 tablet (30 mg total) by mouth at bedtime. 30 tablet 1  . nicotine (NICODERM CQ - DOSED IN MG/24 HOURS) 14 mg/24hr patch Place 1 patch (14 mg total) onto the skin daily. 28 patch 0  . omeprazole (PRILOSEC) 20 MG capsule Take 1 capsule by mouth daily.    . phenytoin (DILANTIN) 100 MG ER capsule Take 400 mg by mouth at bedtime.     . polyethylene glycol (MIRALAX) 17 g packet Take 17 g by mouth daily as needed for mild constipation. 28 each 0  . tiZANidine (ZANAFLEX) 4 MG tablet Take 4 mg by mouth every 8 (eight) hours as needed for muscle spasms.     . traMADol-acetaminophen (ULTRACET) 37.5-325 MG tablet Take 1 tablet by mouth every 4 (four) hours as needed. 90 tablet 5  . traZODone (DESYREL) 100 MG tablet Take 1 tablet by mouth every evening.     No current facility-administered medications on file prior to visit.    Allergies  Allergen Reactions  . Chantix [Varenicline] Hives  . Codeine Nausea And Vomiting  . Flu Virus Vaccine Nausea And Vomiting  . Lithium Nausea And Vomiting    Social History   Substance and Sexual Activity  Alcohol Use No   Comment: h/o heavy use, quit in 1995    Social History   Tobacco Use  Smoking Status Current Every Day Smoker  . Packs/day: 1.00  . Years: 32.00  . Pack years: 32.00  . Types: Cigarettes  Smokeless Tobacco Never Used    Review of Systems  Constitutional: Positive for malaise/fatigue.  HENT: Positive for sinus pain.   Eyes: Negative.   Respiratory: Positive for shortness of breath.   Cardiovascular: Negative.   Gastrointestinal: Positive for nausea.  Genitourinary: Negative.   Musculoskeletal: Positive for joint pain.  Skin: Negative.   Neurological: Negative.   Endo/Heme/Allergies: Negative.   Psychiatric/Behavioral: Negative.     Objective   Vitals:   06/14/20 1123  BP: 137/76  Pulse: 84  Resp: 12  Temp: 98.9 F (37.2 C)   SpO2: 94%    Physical Exam Vitals reviewed.  Constitutional:      Appearance: Normal appearance. She is normal weight. She is not ill-appearing.  HENT:     Head: Normocephalic and atraumatic.  Cardiovascular:     Rate and Rhythm: Normal rate and regular rhythm.     Heart sounds: Normal heart sounds. No murmur heard.  No friction rub. No gallop.   Pulmonary:     Effort: No respiratory distress.     Breath sounds: Normal breath sounds. No stridor. No wheezing, rhonchi or rales.     Comments: Heart sounds distant. Lymphadenopathy:     Cervical: No cervical adenopathy.  Skin:    General: Skin is warm and dry.  Neurological:     Mental Status: She is alert.   Breast: There is a thickening dominant mass at the 2 o'clock position in the right breast, but this is not associated with the core biopsy site.  No nipple discharge is noted.  The axilla is negative for palpable nodes.  Left breast examination reveals no dominant mass, nipple discharge, or dimpling.  The axilla is negative for palpable nodes. Mammography and pathology reports reviewed Assessment  Newly diagnosed invasive ductal carcinoma of right breast, COPD with occasional home O2 use Plan   I discussed all options with the patient including a right modified radical mastectomy versus a right partial mastectomy, sentinel lymph node biopsy, and radiation therapy. She has decided to undergo right partial mastectomy after needle localization, sentinel lymph node biopsy on 07/06/20.  The risks and benefits of the procedure including bleeding, infection, cardiopulmonary difficulties, and the possibility of arm swelling were fully explained to the patient, who gives informed consent.

## 2020-06-27 NOTE — Patient Instructions (Signed)
[name]  06/27/2020     @PREFPERIOPPHARMACY @   Your procedure is scheduled on  07/06/2020.  Report to [facility] at  0700  A.M.  Call this number if you have problems the morning of surgery:  [phone]   Remember:  Do not eat or drink after midnight.                         Take these medicines the morning of surgery with A SIP OF WATER  Clonazepam, prilosec, dilantin, zanaflex(if needed), tramadol(if needed). Use your inhaler and your nebulizer before you come. Bring your rescue inhaler with you.    Do not wear jewelry, make-up or nail polish.  Do not wear lotions, powders, or perfumes, or deodorant. Please brush your teeth.  Do not shave 48 hours prior to surgery.  Men may shave face and neck.  Do not bring valuables to the hospital.  [facility] is not responsible for any belongings or valuables.  Contacts, dentures or bridgework may not be worn into surgery.  Leave your suitcase in the car.  After surgery it may be brought to your room.  For patients admitted to the hospital, discharge time will be determined by your treatment team.  Patients discharged the day of surgery will not be allowed to drive home.   Name and phone number of your driver:   family Special instructions:   DO NOT smoke the morning of your procedure.  Please read over the following fact sheets that you were given. Anesthesia Post-op Instructions and Care and Recovery After Surgery       Sentinel Lymph Node Biopsy in Breast Cancer Treatment, Care After This sheet gives you information about how to care for yourself after your procedure. Your health care provider may also give you more specific instructions. If you have problems or questions, contact your health care provider. What can I expect after the procedure? After the procedure, it is common to have:  Blue urine and darker stool for the next 24 hours. This is normal. It is caused by the dye that was used during the  procedure.  Blue skin at the injection site. This may last for up to 8 weeks.  Numbness, tingling, or pain near your incision.  Swelling or bruising near your incision. Follow these instructions at home: Activity  Avoid activities that take a lot of effort.  Return to your normal activities as told by your health care provider. Ask your health care provider what activities are safe for you. Incision care   Follow instructions from your health care provider about how to take care of your incision. Make sure you: ? Wash your hands with soap and water for at least 20 seconds before and after you change your bandage (dressing). If soap and water are not available, use hand sanitizer. ? Change your dressing as told by your health care provider. ? Leave stitches (sutures), skin glue, or adhesive strips in place. These skin closures may need to stay in place for 2 weeks or longer. If adhesive strip edges start to loosen and curl up, you may trim the loose edges. Do not remove adhesive strips completely unless your health care provider tells you to do that.  Do not take baths, swim, or use a hot tub until your health care provider approves. Ask your health care provider if you can take showers. You may be able to  shower 24 hours after your procedure.  Check your biopsy site every day for signs of infection. Check for: ? More redness, swelling, or pain. ? Fluid or blood. ? Warmth. ? Pus or a bad smell. General instructions  If you were given a surgical bra, wear it for the next 48 hours. You may remove the bra to shower.  Take over-the-counter and prescription medicines only as told by your health care provider.  You may resume your regular diet.  Do not have your blood pressure taken or have blood drawn from the arm on the side of the biopsy until your health care provider says it is okay.  You may need to be screened for extra fluid around the lymph nodes and swelling in the breast and  arm (lymphedema). Follow instructions from your health care provider about how often you should be checked.  Keep all follow-up visits as told by your health care provider. This is important. Contact a health care provider if you have:  Nausea and vomiting.  Any of these signs of infection: ? More redness, swelling, or pain around your biopsy site. ? Fluid or blood coming from your incision. ? Warmth coming from your incision area. ? Pus or a bad smell coming from your incision.  Any new bruising.  Chills or a fever. Get help right away if you have:  Pain that is getting worse and your medicine is not helping.  Vomiting that will not stop.  Chest pain or trouble breathing. Summary  After the procedure, it is common to have blue urine and darker stool for the next 24 hours. This is normal. It is caused by the dye that was used during the procedure.  Follow instructions from your health care provider about how to take care of your incision.  Do not have your blood pressure taken or have blood drawn from the arm on the side of the biopsy until your health care provider says it is okay.  You may need to be screened for extra fluid around the lymph nodes and swelling in the breast and arm (lymphedema). Follow instructions from your health care provider about how often you should be checked. This information is not intended to replace advice given to you by your health care provider. Make sure you discuss any questions you have with your health care provider. Document Revised: 06/01/2019 Document Reviewed: 06/01/2019 Elsevier Patient Education  [place] Anesthesia, Adult, Care After This sheet gives you information about how to care for yourself after your procedure. Your health care provider may also give you more specific instructions. If you have problems or questions, contact your health care provider. What can I expect after the procedure? After the  procedure, the following side effects are common:  Pain or discomfort at the IV site.  Nausea.  Vomiting.  Sore throat.  Trouble concentrating.  Feeling cold or chills.  Weak or tired.  Sleepiness and fatigue.  Soreness and body aches. These side effects can affect parts of the body that were not involved in surgery. Follow these instructions at home:  For at least 24 hours after the procedure:  Have a responsible adult stay with you. It is important to have someone help care for you until you are awake and alert.  Rest as needed.  Do not: ? Participate in activities in which you could fall or become injured. ? Drive. ? Use heavy machinery. ? Drink alcohol. ? Take sleeping pills or medicines that  cause drowsiness. ? Make important decisions or sign legal documents. ? Take care of children on your own. Eating and drinking  Follow any instructions from your health care provider about eating or drinking restrictions.  When you feel hungry, start by eating small amounts of foods that are soft and easy to digest (bland), such as toast. Gradually return to your regular diet.  Drink enough fluid to keep your urine pale yellow.  If you vomit, rehydrate by drinking water, juice, or clear broth. General instructions  If you have sleep apnea, surgery and certain medicines can increase your risk for breathing problems. Follow instructions from your health care provider about wearing your sleep device: ? Anytime you are sleeping, including during daytime naps. ? While taking prescription pain medicines, sleeping medicines, or medicines that make you drowsy.  Return to your normal activities as told by your health care provider. Ask your health care provider what activities are safe for you.  Take over-the-counter and prescription medicines only as told by your health care provider.  If you smoke, do not smoke without supervision.  Keep all follow-up visits as told by your  health care provider. This is important. Contact a health care provider if:  You have nausea or vomiting that does not get better with medicine.  You cannot eat or drink without vomiting.  You have pain that does not get better with medicine.  You are unable to pass urine.  You develop a skin rash.  You have a fever.  You have redness around your IV site that gets worse. Get help right away if:  You have difficulty breathing.  You have chest pain.  You have blood in your urine or stool, or you vomit blood. Summary  After the procedure, it is common to have a sore throat or nausea. It is also common to feel tired.  Have a responsible adult stay with you for the first 24 hours after general anesthesia. It is important to have someone help care for you until you are awake and alert.  When you feel hungry, start by eating small amounts of foods that are soft and easy to digest (bland), such as toast. Gradually return to your regular diet.  Drink enough fluid to keep your urine pale yellow.  Return to your normal activities as told by your health care provider. Ask your health care provider what activities are safe for you. This information is not intended to replace advice given to you by your health care provider. Make sure you discuss any questions you have with your health care provider. Document Revised: 10/18/2017 Document Reviewed: 05/31/2017 Elsevier Patient Education  [place]. How to Use Chlorhexidine for Bathing Chlorhexidine gluconate (CHG) is a germ-killing (antiseptic) solution that is used to clean the skin. It can get rid of the bacteria that normally live on the skin and can keep them away for about 24 hours. To clean your skin with CHG, you may be given:  A CHG solution to use in the shower or as part of a sponge bath.  A prepackaged cloth that contains CHG. Cleaning your skin with CHG may help lower the risk for infection:  While you are staying in  the intensive care unit of the hospital.  If you have a vascular access, such as a central line, to provide short-term or long-term access to your veins.  If you have a catheter to drain urine from your bladder.  If you are on a ventilator. A  ventilator is a machine that helps you breathe by moving air in and out of your lungs.  After surgery. What are the risks? Risks of using CHG include:  A skin reaction.  Hearing loss, if CHG gets in your ears.  Eye injury, if CHG gets in your eyes and is not rinsed out.  The CHG product catching fire. Make sure that you avoid smoking and flames after applying CHG to your skin. Do not use CHG:  If you have a chlorhexidine allergy or have previously reacted to chlorhexidine.  On babies younger than 22 months of age. How to use CHG solution  Use CHG only as told by your health care provider, and follow the instructions on the label.  Use the full amount of CHG as directed. Usually, this is one bottle. During a shower Follow these steps when using CHG solution during a shower (unless your health care provider gives you different instructions): 1. Start the shower. 2. Use your normal soap and shampoo to wash your face and hair. 3. Turn off the shower or move out of the shower stream. 4. Pour the CHG onto a clean washcloth. Do not use any type of brush or rough-edged sponge. 5. Starting at your neck, lather your body down to your toes. Make sure you follow these instructions: ? If you will be having surgery, pay special attention to the part of your body where you will be having surgery. Scrub this area for at least 1 minute. ? Do not use CHG on your head or face. If the solution gets into your ears or eyes, rinse them well with water. ? Avoid your genital area. ? Avoid any areas of skin that have broken skin, cuts, or scrapes. ? Scrub your back and under your arms. Make sure to wash skin folds. 6. Let the lather sit on your skin for 1-2  minutes or as long as told by your health care provider. 7. Thoroughly rinse your entire body in the shower. Make sure that all body creases and crevices are rinsed well. 8. Dry off with a clean towel. Do not put any substances on your body afterward--such as powder, lotion, or perfume--unless you are told to do so by your health care provider. Only use lotions that are recommended by the manufacturer. 9. Put on clean clothes or pajamas. 10. If it is the night before your surgery, sleep in clean sheets.  During a sponge bath Follow these steps when using CHG solution during a sponge bath (unless your health care provider gives you different instructions): 1. Use your normal soap and shampoo to wash your face and hair. 2. Pour the CHG onto a clean washcloth. 3. Starting at your neck, lather your body down to your toes. Make sure you follow these instructions: ? If you will be having surgery, pay special attention to the part of your body where you will be having surgery. Scrub this area for at least 1 minute. ? Do not use CHG on your head or face. If the solution gets into your ears or eyes, rinse them well with water. ? Avoid your genital area. ? Avoid any areas of skin that have broken skin, cuts, or scrapes. ? Scrub your back and under your arms. Make sure to wash skin folds. 4. Let the lather sit on your skin for 1-2 minutes or as long as told by your health care provider. 5. Using a different clean, wet washcloth, thoroughly rinse your entire body. Make sure  that all body creases and crevices are rinsed well. 6. Dry off with a clean towel. Do not put any substances on your body afterward--such as powder, lotion, or perfume--unless you are told to do so by your health care provider. Only use lotions that are recommended by the manufacturer. 7. Put on clean clothes or pajamas. 8. If it is the night before your surgery, sleep in clean sheets. How to use CHG prepackaged cloths  Only use CHG  cloths as told by your health care provider, and follow the instructions on the label.  Use the CHG cloth on clean, dry skin.  Do not use the CHG cloth on your head or face unless your health care provider tells you to.  When washing with the CHG cloth: ? Avoid your genital area. ? Avoid any areas of skin that have broken skin, cuts, or scrapes. Before surgery Follow these steps when using a CHG cloth to clean before surgery (unless your health care provider gives you different instructions): 1. Using the CHG cloth, vigorously scrub the part of your body where you will be having surgery. Scrub using a back-and-forth motion for 3 minutes. The area on your body should be completely wet with CHG when you are done scrubbing. 2. Do not rinse. Discard the cloth and let the area air-dry. Do not put any substances on the area afterward, such as powder, lotion, or perfume. 3. Put on clean clothes or pajamas. 4. If it is the night before your surgery, sleep in clean sheets.  For general bathing Follow these steps when using CHG cloths for general bathing (unless your health care provider gives you different instructions). 1. Use a separate CHG cloth for each area of your body. Make sure you wash between any folds of skin and between your fingers and toes. Wash your body in the following order, switching to a new cloth after each step: ? The front of your neck, shoulders, and chest. ? Both of your arms, under your arms, and your hands. ? Your stomach and groin area, avoiding the genitals. ? Your right leg and foot. ? Your left leg and foot. ? The back of your neck, your back, and your buttocks. 2. Do not rinse. Discard the cloth and let the area air-dry. Do not put any substances on your body afterward--such as powder, lotion, or perfume--unless you are told to do so by your health care provider. Only use lotions that are recommended by the manufacturer. 3. Put on clean clothes or pajamas. Contact a  health care provider if:  Your skin gets irritated after scrubbing.  You have questions about using your solution or cloth. Get help right away if:  Your eyes become very red or swollen.  Your eyes itch badly.  Your skin itches badly and is red or swollen.  Your hearing changes.  You have trouble seeing.  You have swelling or tingling in your mouth or throat.  You have trouble breathing.  You swallow any chlorhexidine. Summary  Chlorhexidine gluconate (CHG) is a germ-killing (antiseptic) solution that is used to clean the skin. Cleaning your skin with CHG may help to lower your risk for infection.  You may be given CHG to use for bathing. It may be in a bottle or in a prepackaged cloth to use on your skin. Carefully follow your health care provider's instructions and the instructions on the product label.  Do not use CHG if you have a chlorhexidine allergy.  Contact your health care  provider if your skin gets irritated after scrubbing. This information is not intended to replace advice given to you by your health care provider. Make sure you discuss any questions you have with your health care provider. Document Revised: 01/01/2019 Document Reviewed: 09/12/2017 Elsevier Patient Education  [place].

## 2020-07-01 ENCOUNTER — Other Ambulatory Visit: Payer: Self-pay

## 2020-07-01 ENCOUNTER — Encounter (HOSPITAL_COMMUNITY): Payer: Self-pay

## 2020-07-01 ENCOUNTER — Encounter (HOSPITAL_COMMUNITY)
Admission: RE | Admit: 2020-07-01 | Discharge: 2020-07-01 | Disposition: A | Discharge status: Home / Self Care | Payer: Medicare Other | Source: Ambulatory Visit | Attending: General Surgery | Admitting: General Surgery

## 2020-07-01 ENCOUNTER — Other Ambulatory Visit (HOSPITAL_COMMUNITY): Payer: Medicare Other

## 2020-07-01 DIAGNOSIS — Z01812 Encounter for preprocedural laboratory examination: Secondary | ICD-10-CM | POA: Diagnosis not present

## 2020-07-01 HISTORY — DX: Unspecified osteoarthritis, unspecified site: M19.90

## 2020-07-01 HISTORY — DX: Cerebral infarction, unspecified: I63.9

## 2020-07-01 LAB — CBC WITH DIFFERENTIAL/PLATELET
Abs Immature Granulocytes: 0.03 10*3/uL (ref 0.00–0.07)
Basophils Absolute: 0.1 10*3/uL (ref 0.0–0.1)
Basophils Relative: 1 %
Eosinophils Absolute: 0.1 10*3/uL (ref 0.0–0.5)
Eosinophils Relative: 1 %
HCT: 38.2 % (ref 36.0–46.0)
Hemoglobin: 13 g/dL (ref 12.0–15.0)
Immature Granulocytes: 0 %
Lymphocytes Relative: 26 %
Lymphs Abs: 2.2 10*3/uL (ref 0.7–4.0)
MCH: 31.9 pg (ref 26.0–34.0)
MCHC: 34 g/dL (ref 30.0–36.0)
MCV: 93.9 fL (ref 80.0–100.0)
Monocytes Absolute: 0.9 10*3/uL (ref 0.1–1.0)
Monocytes Relative: 10 %
Neutro Abs: 5.3 10*3/uL (ref 1.7–7.7)
Neutrophils Relative %: 62 %
Platelets: 270 10*3/uL (ref 150–400)
RBC: 4.07 MIL/uL (ref 3.87–5.11)
RDW: 13.3 % (ref 11.5–15.5)
WBC: 8.6 10*3/uL (ref 4.0–10.5)
nRBC: 0 % (ref 0.0–0.2)

## 2020-07-01 LAB — COMPREHENSIVE METABOLIC PANEL
ALT: 15 U/L (ref 0–44)
AST: 16 U/L (ref 15–41)
Albumin: 4.1 g/dL (ref 3.5–5.0)
Alkaline Phosphatase: 112 U/L (ref 38–126)
Anion gap: 12 (ref 5–15)
BUN: 8 mg/dL (ref 8–23)
CO2: 20 mmol/L — ABNORMAL LOW (ref 22–32)
Calcium: 8.9 mg/dL (ref 8.9–10.3)
Chloride: 102 mmol/L (ref 98–111)
Creatinine, Ser: 0.87 mg/dL (ref 0.44–1.00)
GFR calc Af Amer: >60 mL/min (ref >60)
GFR calc non Af Amer: >60 mL/min (ref >60)
Glucose, Bld: 119 mg/dL — ABNORMAL HIGH (ref 70–99)
Potassium: 3.6 mmol/L (ref 3.5–5.1)
Sodium: 134 mmol/L — ABNORMAL LOW (ref 135–145)
Total Bilirubin: 0.4 mg/dL (ref 0.3–1.2)
Total Protein: 7.3 g/dL (ref 6.5–8.1)

## 2020-07-05 ENCOUNTER — Other Ambulatory Visit: Payer: Self-pay

## 2020-07-05 ENCOUNTER — Encounter (HOSPITAL_COMMUNITY): Payer: Medicare Other

## 2020-07-05 ENCOUNTER — Other Ambulatory Visit (HOSPITAL_COMMUNITY)
Admission: RE | Admit: 2020-07-05 | Discharge: 2020-07-05 | Disposition: A | Discharge status: Home / Self Care | Payer: Medicare Other | Source: Ambulatory Visit | Attending: General Surgery | Admitting: General Surgery

## 2020-07-05 ENCOUNTER — Other Ambulatory Visit (HOSPITAL_COMMUNITY): Payer: Medicare Other

## 2020-07-05 DIAGNOSIS — Z01812 Encounter for preprocedural laboratory examination: Secondary | ICD-10-CM | POA: Diagnosis not present

## 2020-07-05 DIAGNOSIS — Z20822 Contact with and (suspected) exposure to covid-19: Secondary | ICD-10-CM | POA: Diagnosis not present

## 2020-07-05 LAB — SARS CORONAVIRUS 2 (TAT 6-24 HRS): SARS Coronavirus 2: NEGATIVE

## 2020-07-06 ENCOUNTER — Encounter (HOSPITAL_COMMUNITY): Payer: Self-pay | Admitting: General Surgery

## 2020-07-06 ENCOUNTER — Encounter (HOSPITAL_COMMUNITY)
Admission: RE | Disposition: A | Discharge status: Home / Self Care | Payer: Self-pay | Source: Home / Self Care | Attending: General Surgery

## 2020-07-06 ENCOUNTER — Ambulatory Visit (HOSPITAL_COMMUNITY): Payer: Medicare Other | Admitting: Anesthesiology

## 2020-07-06 ENCOUNTER — Ambulatory Visit (HOSPITAL_COMMUNITY): Payer: Medicare Other

## 2020-07-06 ENCOUNTER — Encounter (HOSPITAL_COMMUNITY): Payer: Medicare Other

## 2020-07-06 ENCOUNTER — Ambulatory Visit (HOSPITAL_COMMUNITY)
Admission: RE | Admit: 2020-07-06 | Discharge: 2020-07-06 | Disposition: A | Discharge status: Home / Self Care | Payer: Medicare Other | Source: Ambulatory Visit | Attending: General Surgery | Admitting: General Surgery

## 2020-07-06 ENCOUNTER — Ambulatory Visit (HOSPITAL_COMMUNITY)
Admission: RE | Admit: 2020-07-06 | Discharge: 2020-07-06 | Disposition: A | Discharge status: Home / Self Care | Payer: Medicare Other | Attending: General Surgery | Admitting: General Surgery

## 2020-07-06 DIAGNOSIS — R569 Unspecified convulsions: Secondary | ICD-10-CM | POA: Diagnosis not present

## 2020-07-06 DIAGNOSIS — J449 Chronic obstructive pulmonary disease, unspecified: Secondary | ICD-10-CM | POA: Diagnosis not present

## 2020-07-06 DIAGNOSIS — C50211 Malignant neoplasm of upper-inner quadrant of right female breast: Secondary | ICD-10-CM | POA: Diagnosis not present

## 2020-07-06 DIAGNOSIS — C50911 Malignant neoplasm of unspecified site of right female breast: Secondary | ICD-10-CM | POA: Diagnosis not present

## 2020-07-06 DIAGNOSIS — F17210 Nicotine dependence, cigarettes, uncomplicated: Secondary | ICD-10-CM | POA: Insufficient documentation

## 2020-07-06 DIAGNOSIS — F419 Anxiety disorder, unspecified: Secondary | ICD-10-CM | POA: Diagnosis not present

## 2020-07-06 DIAGNOSIS — Z888 Allergy status to other drugs, medicaments and biological substances status: Secondary | ICD-10-CM | POA: Diagnosis not present

## 2020-07-06 DIAGNOSIS — Z885 Allergy status to narcotic agent status: Secondary | ICD-10-CM | POA: Insufficient documentation

## 2020-07-06 DIAGNOSIS — K219 Gastro-esophageal reflux disease without esophagitis: Secondary | ICD-10-CM | POA: Insufficient documentation

## 2020-07-06 DIAGNOSIS — Z79899 Other long term (current) drug therapy: Secondary | ICD-10-CM | POA: Diagnosis not present

## 2020-07-06 DIAGNOSIS — R928 Other abnormal and inconclusive findings on diagnostic imaging of breast: Secondary | ICD-10-CM

## 2020-07-06 DIAGNOSIS — C50411 Malignant neoplasm of upper-outer quadrant of right female breast: Secondary | ICD-10-CM

## 2020-07-06 DIAGNOSIS — I6930 Unspecified sequelae of cerebral infarction: Secondary | ICD-10-CM | POA: Insufficient documentation

## 2020-07-06 HISTORY — PX: PARTIAL MASTECTOMY WITH AXILLARY SENTINEL LYMPH NODE BIOPSY: SHX6004

## 2020-07-06 SURGERY — PARTIAL MASTECTOMY WITH AXILLARY SENTINEL LYMPH NODE BIOPSY
Anesthesia: General | Site: Breast | Laterality: Right

## 2020-07-06 MED ORDER — ONDANSETRON HCL 4 MG/2ML IJ SOLN
INTRAMUSCULAR | Status: AC
  Filled 2020-07-06: qty 2

## 2020-07-06 MED ORDER — FENTANYL CITRATE (PF) 100 MCG/2ML IJ SOLN: 25.0000 ug | INTRAMUSCULAR | Status: DC | PRN

## 2020-07-06 MED ORDER — FENTANYL CITRATE (PF) 100 MCG/2ML IJ SOLN
INTRAMUSCULAR | Status: AC
  Filled 2020-07-06: qty 2

## 2020-07-06 MED ORDER — ONDANSETRON HCL 4 MG/2ML IJ SOLN: 4.0000 mg | Freq: Once | INTRAMUSCULAR | Status: DC | PRN

## 2020-07-06 MED ORDER — MIDAZOLAM HCL 5 MG/5ML IJ SOLN
INTRAMUSCULAR | Status: DC | PRN
  Administered 2020-07-06: 2 mg via INTRAVENOUS

## 2020-07-06 MED ORDER — PROPOFOL 10 MG/ML IV BOLUS
INTRAVENOUS | Status: DC | PRN
  Administered 2020-07-06: 50 mg via INTRAVENOUS
  Administered 2020-07-06: 150 mg via INTRAVENOUS

## 2020-07-06 MED ORDER — PROPOFOL 10 MG/ML IV BOLUS
INTRAVENOUS | Status: AC
  Filled 2020-07-06: qty 20

## 2020-07-06 MED ORDER — SODIUM CHLORIDE (PF) 0.9 % IJ SOLN
INTRAMUSCULAR | Status: AC
  Filled 2020-07-06: qty 30

## 2020-07-06 MED ORDER — KETOROLAC TROMETHAMINE 15 MG/ML IJ SOLN
INTRAMUSCULAR | Status: AC
  Filled 2020-07-06: qty 1

## 2020-07-06 MED ORDER — LACTATED RINGERS IV SOLN: INTRAVENOUS | Status: DC | PRN

## 2020-07-06 MED ORDER — BUPIVACAINE LIPOSOME 1.3 % IJ SUSP
INTRAMUSCULAR | Status: DC | PRN
  Administered 2020-07-06: 18 mL

## 2020-07-06 MED ORDER — METHYLENE BLUE 0.5 % INJ SOLN
INTRAVENOUS | Status: AC
  Filled 2020-07-06: qty 10

## 2020-07-06 MED ORDER — OXYCODONE-ACETAMINOPHEN 10-325 MG PO TABS: 1.0000 | ORAL_TABLET | Freq: Four times a day (QID) | ORAL | 0 refills | Status: DC | PRN

## 2020-07-06 MED ORDER — FENTANYL CITRATE (PF) 100 MCG/2ML IJ SOLN
INTRAMUSCULAR | Status: DC | PRN
  Administered 2020-07-06 (×2): 50 ug via INTRAVENOUS

## 2020-07-06 MED ORDER — BUPIVACAINE LIPOSOME 1.3 % IJ SUSP
INTRAMUSCULAR | Status: AC
  Filled 2020-07-06: qty 20

## 2020-07-06 MED ORDER — LACTATED RINGERS IV SOLN
INTRAVENOUS | Status: DC
  Administered 2020-07-06: 1000 mL via INTRAVENOUS

## 2020-07-06 MED ORDER — EPHEDRINE SULFATE 50 MG/ML IJ SOLN
INTRAMUSCULAR | Status: DC | PRN
  Administered 2020-07-06 (×2): 10 mg via INTRAVENOUS

## 2020-07-06 MED ORDER — SODIUM CHLORIDE (PF) 0.9 % IJ SOLN
INTRAVENOUS | Status: DC | PRN
  Administered 2020-07-06: 4 mL via INTRAMUSCULAR

## 2020-07-06 MED ORDER — CHLORHEXIDINE GLUCONATE CLOTH 2 % EX PADS: 6.0000 | MEDICATED_PAD | Freq: Once | CUTANEOUS | Status: DC

## 2020-07-06 MED ORDER — TECHNETIUM TC 99M SULFUR COLLOID FILTERED
0.5000 | Freq: Once | INTRAVENOUS | Status: AC | PRN
  Administered 2020-07-06: 0.5 via INTRADERMAL

## 2020-07-06 MED ORDER — BUPIVACAINE HCL (PF) 0.5 % IJ SOLN
INTRAMUSCULAR | Status: AC
  Filled 2020-07-06: qty 30

## 2020-07-06 MED ORDER — CHLORHEXIDINE GLUCONATE 0.12 % MT SOLN
15.0000 mL | Freq: Once | OROMUCOSAL | Status: AC
  Administered 2020-07-06: 15 mL via OROMUCOSAL

## 2020-07-06 MED ORDER — ONDANSETRON HCL 4 MG/2ML IJ SOLN
INTRAMUSCULAR | Status: DC | PRN
  Administered 2020-07-06: 4 mg via INTRAVENOUS

## 2020-07-06 MED ORDER — CEFAZOLIN SODIUM-DEXTROSE 2-4 GM/100ML-% IV SOLN
2.0000 g | INTRAVENOUS | Status: AC
  Administered 2020-07-06: 2 g via INTRAVENOUS
  Filled 2020-07-06: qty 100

## 2020-07-06 MED ORDER — 0.9 % SODIUM CHLORIDE (POUR BTL) OPTIME
TOPICAL | Status: DC | PRN
  Administered 2020-07-06: 1000 mL

## 2020-07-06 MED ORDER — MIDAZOLAM HCL 2 MG/2ML IJ SOLN
INTRAMUSCULAR | Status: AC
  Filled 2020-07-06: qty 2

## 2020-07-06 MED ORDER — KETOROLAC TROMETHAMINE 15 MG/ML IJ SOLN
15.0000 mg | Freq: Once | INTRAMUSCULAR | Status: AC
  Administered 2020-07-06: 15 mg via INTRAVENOUS

## 2020-07-06 MED ORDER — PENTAFLUOROPROP-TETRAFLUOROETH EX AERO
INHALATION_SPRAY | CUTANEOUS | Status: AC
  Filled 2020-07-06: qty 116

## 2020-07-06 MED ORDER — LIDOCAINE HCL (PF) 2 % IJ SOLN
INTRAMUSCULAR | Status: AC
  Filled 2020-07-06: qty 10

## 2020-07-06 MED ORDER — ORAL CARE MOUTH RINSE: 15.0000 mL | Freq: Once | OROMUCOSAL | Status: AC

## 2020-07-06 SURGICAL SUPPLY — 46 items
ADH SKN CLS APL DERMABOND .7 (GAUZE/BANDAGES/DRESSINGS) ×4
APL PRP STRL LF DISP 70% ISPRP (MISCELLANEOUS) ×2
APPLIER CLIP 9.375 SM OPEN (CLIP) ×8
APR CLP SM 9.3 20 MLT OPN (CLIP) ×4
BLADE SURG 15 STRL LF DISP TIS (BLADE) ×2 IMPLANT
BLADE SURG 15 STRL SS (BLADE) ×4
CHLORAPREP W/TINT 26 (MISCELLANEOUS) ×4 IMPLANT
CLIP APPLIE 9.375 SM OPEN (CLIP) ×3 IMPLANT
CLOTH BEACON ORANGE TIMEOUT ST (SAFETY) ×4 IMPLANT
COVER LIGHT HANDLE STERIS (MISCELLANEOUS) ×8 IMPLANT
COVER PROBE W GEL 5X96 (DRAPES) ×7 IMPLANT
COVER SURGICAL LIGHT HANDLE (MISCELLANEOUS) ×8 IMPLANT
COVER WAND RF STERILE (DRAPES) ×8 IMPLANT
DECANTER SPIKE VIAL GLASS SM (MISCELLANEOUS) ×4 IMPLANT
DERMABOND ADVANCED (GAUZE/BANDAGES/DRESSINGS) ×4
DERMABOND ADVANCED .7 DNX12 (GAUZE/BANDAGES/DRESSINGS) ×4 IMPLANT
DURAPREP 26ML APPLICATOR (WOUND CARE) ×3 IMPLANT
ELECT REM PT RETURN 9FT ADLT (ELECTROSURGICAL) ×8
ELECTRODE REM PT RTRN 9FT ADLT (ELECTROSURGICAL) ×4 IMPLANT
GLOVE BIO SURGEON STRL SZ7 (GLOVE) ×3 IMPLANT
GLOVE BIO SURGEON STRL SZ7.5 (GLOVE) ×3 IMPLANT
GLOVE BIOGEL PI IND STRL 7.0 (GLOVE) ×8 IMPLANT
GLOVE BIOGEL PI IND STRL 8 (GLOVE) ×1 IMPLANT
GLOVE BIOGEL PI INDICATOR 7.0 (GLOVE) ×8
GLOVE BIOGEL PI INDICATOR 8 (GLOVE) ×2
GLOVE ECLIPSE 7.0 STRL STRAW (GLOVE) ×3 IMPLANT
GLOVE SURG SS PI 7.5 STRL IVOR (GLOVE) ×8 IMPLANT
GOWN STRL REUS W/TWL LRG LVL3 (GOWN DISPOSABLE) ×16 IMPLANT
INST SET MINOR GENERAL (KITS) ×4 IMPLANT
KIT TURNOVER KIT A (KITS) ×8 IMPLANT
MANIFOLD NEPTUNE II (INSTRUMENTS) ×4 IMPLANT
NDL HYPO 25X1 1.5 SAFETY (NEEDLE) ×1 IMPLANT
NEEDLE HYPO 22GX1.5 SAFETY (NEEDLE) ×3 IMPLANT
NEEDLE HYPO 25X1 1.5 SAFETY (NEEDLE) ×4 IMPLANT
NS IRRIG 1000ML POUR BTL (IV SOLUTION) ×8 IMPLANT
PACK MINOR (CUSTOM PROCEDURE TRAY) ×8 IMPLANT
PAD ARMBOARD 7.5X6 YLW CONV (MISCELLANEOUS) ×12 IMPLANT
PENCIL SMOKE EVACUATOR (MISCELLANEOUS) ×3 IMPLANT
SET BASIN LINEN APH (SET/KITS/TRAYS/PACK) ×8 IMPLANT
SPONGE LAP 18X18 RF (DISPOSABLE) ×4 IMPLANT
SUT MNCRL AB 4-0 PS2 18 (SUTURE) ×11 IMPLANT
SUT SILK 2 0 SH (SUTURE) ×7 IMPLANT
SUT VIC AB 3-0 SH 27 (SUTURE) ×12
SUT VIC AB 3-0 SH 27X BRD (SUTURE) ×5 IMPLANT
SYR 20ML LL LF (SYRINGE) ×6 IMPLANT
SYR CONTROL 10ML LL (SYRINGE) ×8 IMPLANT

## 2020-07-06 NOTE — Discharge Instructions (Signed)
[place] [facility] UNTIL Sunday July 10, 2020. DO NOT USE ANY NUMBING MEDICATIONS WITHOUT CONSULTING A PHYSICIAN UNTIL AFTER SUNDAY     Lumpectomy, Care After This sheet gives you information about how to care for yourself after your procedure. Your health care provider may also give you more specific instructions. If you have problems or questions, contact your health care provider. What can I expect after the procedure? After the procedure, it is common to have:  Breast swelling.  Breast tenderness.  Stiffness in your arm or shoulder.  A change in the shape and feel of your breast.  Scar tissue that feels hard to the touch in the area where the lump was removed. Follow these instructions at home: Medicines  Take over-the-counter and prescription medicines only as told by your health care provider.  If you were prescribed an antibiotic medicine, take it as told by your health care provider. Do not stop taking the antibiotic even if you start to feel better.  Ask your health care provider if the medicine prescribed to you: ? Requires you to avoid driving or using heavy machinery. ? Can cause constipation. You may need to take these actions to prevent or treat constipation:  Drink enough fluid to keep your urine pale yellow.  Take over-the-counter or prescription medicines.  Eat foods that are high in fiber, such as beans, whole grains, and fresh fruits and vegetables.  Limit foods that are high in fat and processed sugars, such as fried or sweet foods. Incision care      Follow instructions from your health care provider about how to take care of your incision. Make sure you: ? Wash your hands with soap and water before and after you change your bandage (dressing). If soap and water are not available, use hand sanitizer. ? Change your dressing as told by your health care provider. ? Leave stitches (sutures), skin glue, or adhesive strips in  place. These skin closures may need to stay in place for 2 weeks or longer. If adhesive strip edges start to loosen and curl up, you may trim the loose edges. Do not remove adhesive strips completely unless your health care provider tells you to do that.  Check your incision area every day for signs of infection. Check for: ? More redness, swelling, or pain. ? Fluid or blood. ? Warmth. ? Pus or a bad smell.  Keep your dressing clean and dry.  If you were sent home with a surgical drain in place, follow instructions from your health care provider about emptying it. Bathing  Do not take baths, swim, or use a hot tub until your health care provider approves.  Ask your health care provider if you may take showers. You may only be allowed to take sponge baths. Activity  Rest as told by your health care provider.  Avoid sitting for a long time without moving. Get up to take short walks every 1-2 hours. This is important to improve blood flow and breathing. Ask for help if you feel weak or unsteady.  Return to your normal activities as told by your health care provider. Ask your health care provider what activities are safe for you.  Be careful to avoid any activities that could cause an injury to your arm on the side of your surgery.  Do not lift anything that is heavier than 10 lb (4.5 kg), or the limit that you are told, until your health care provider says that it is  safe. Avoid lifting with the arm that is on the side of your surgery.  Do not carry heavy objects on your shoulder on the side of your surgery.  Do exercises to keep your shoulder and arm from getting stiff and swollen. Talk with your health care provider about which exercises are safe for you. General instructions  Wear a supportive bra as told by your health care provider.  Raise (elevate) your arm above the level of your heart while you are sitting or lying down.  Do not wear tight jewelry on your arm, wrist, or  fingers on the side of your surgery.  Keep all follow-up visits as told by your health care provider. This is important. ? You may need to be screened for extra fluid around the lymph nodes and swelling in the breast and arm (lymphedema). Follow instructions from your health care provider about how often you should be checked.  If you had any lymph nodes removed during your procedure, be sure to tell all of your health care providers. This is important information to share before you are involved in certain procedures, such as having blood tests or having your blood pressure taken. Contact a health care provider if:  You develop a rash.  You have a fever.  Your pain medicine is not working.  You have swelling, weakness, or numbness in your arm that does not improve after a few weeks.  You have new swelling in your breast.  You have any of these signs of infection: ? More redness, swelling, or pain in your incision area. ? Fluid or blood coming from your incision. ? Warmth coming from the incision area. ? Pus or a bad smell coming from your incision. Get help right away if you have:  Very bad pain in your breast or arm.  Swelling in your legs or arms.  Redness, warmth, or pain in your leg or arm.  Chest pain.  Difficulty breathing. Summary  After the procedure, it is common to have breast tenderness, swelling in your breast, and stiffness in your arm and shoulder.  Follow instructions from your health care provider about how to take care of your incision.  Do not lift anything that is heavier than 10 lb (4.5 kg), or the limit that you are told, until your health care provider says that it is safe. Avoid lifting with the arm that is on the side of your surgery.  If you had any lymph nodes removed during your procedure, be sure to tell all of your health care providers. This is important information to share before you are involved in certain procedures, such as having blood  tests or having your blood pressure taken. This information is not intended to replace advice given to you by your health care provider. Make sure you discuss any questions you have with your health care provider. Document Revised: 04/20/2019 Document Reviewed: 04/20/2019 Elsevier Patient Education  [place] Anesthesia, Adult, Care After This sheet gives you information about how to care for yourself after your procedure. Your health care provider may also give you more specific instructions. If you have problems or questions, contact your health care provider. What can I expect after the procedure? After the procedure, the following side effects are common:  Pain or discomfort at the IV site.  Nausea.  Vomiting.  Sore throat.  Trouble concentrating.  Feeling cold or chills.  Weak or tired.  Sleepiness and fatigue.  Soreness and body  aches. These side effects can affect parts of the body that were not involved in surgery. Follow these instructions at home:  For at least 24 hours after the procedure:  Have a responsible adult stay with you. It is important to have someone help care for you until you are awake and alert.  Rest as needed.  Do not: ? Participate in activities in which you could fall or become injured. ? Drive. ? Use heavy machinery. ? Drink alcohol. ? Take sleeping pills or medicines that cause drowsiness. ? Make important decisions or sign legal documents. ? Take care of children on your own. Eating and drinking  Follow any instructions from your health care provider about eating or drinking restrictions.  When you feel hungry, start by eating small amounts of foods that are soft and easy to digest (bland), such as toast. Gradually return to your regular diet.  Drink enough fluid to keep your urine pale yellow.  If you vomit, rehydrate by drinking water, juice, or clear broth. General instructions  If you have sleep apnea,  surgery and certain medicines can increase your risk for breathing problems. Follow instructions from your health care provider about wearing your sleep device: ? Anytime you are sleeping, including during daytime naps. ? While taking prescription pain medicines, sleeping medicines, or medicines that make you drowsy.  Return to your normal activities as told by your health care provider. Ask your health care provider what activities are safe for you.  Take over-the-counter and prescription medicines only as told by your health care provider.  If you smoke, do not smoke without supervision.  Keep all follow-up visits as told by your health care provider. This is important. Contact a health care provider if:  You have nausea or vomiting that does not get better with medicine.  You cannot eat or drink without vomiting.  You have pain that does not get better with medicine.  You are unable to pass urine.  You develop a skin rash.  You have a fever.  You have redness around your IV site that gets worse. Get help right away if:  You have difficulty breathing.  You have chest pain.  You have blood in your urine or stool, or you vomit blood. Summary  After the procedure, it is common to have a sore throat or nausea. It is also common to feel tired.  Have a responsible adult stay with you for the first 24 hours after general anesthesia. It is important to have someone help care for you until you are awake and alert.  When you feel hungry, start by eating small amounts of foods that are soft and easy to digest (bland), such as toast. Gradually return to your regular diet.  Drink enough fluid to keep your urine pale yellow.  Return to your normal activities as told by your health care provider. Ask your health care provider what activities are safe for you. This information is not intended to replace advice given to you by your health care provider. Make sure you discuss any  questions you have with your health care provider. Document Revised: 10/18/2017 Document Reviewed: 05/31/2017 Elsevier Patient Education  [place].

## 2020-07-06 NOTE — Interval H&P Note (Signed)
History and Physical Interval Note:  07/06/2020 9:39 AM  [name]  has presented today for surgery, with the diagnosis of Right breast cancer.  The various methods of treatment have been discussed with the patient and family. After consideration of risks, benefits and other options for treatment, the patient has consented to  Procedure(s): PARTIAL MASTECTOMY WITH AXILLARY SENTINEL LYMPH NODE BIOPSY (Right) as a surgical intervention.  The patient's history has been reviewed, patient examined, no change in status, stable for surgery.  I have reviewed the patient's chart and labs.  Questions were answered to the patient's satisfaction.     Aviva Signs

## 2020-07-06 NOTE — Progress Notes (Signed)
[name] from [facility] here for sentinel node procedure.

## 2020-07-06 NOTE — Anesthesia Procedure Notes (Signed)
Procedure Name: LMA Insertion Date/Time: 07/06/2020 9:53 AM Performed by: Jonna Munro, CRNA Pre-anesthesia Checklist: Patient identified, Emergency Drugs available, Suction available, Patient being monitored and Timeout performed Patient Re-evaluated:Patient Re-evaluated prior to induction Oxygen Delivery Method: Circle system utilized Preoxygenation: Pre-oxygenation with 100% oxygen Induction Type: IV induction LMA: LMA inserted LMA Size: 3.0 Number of attempts: 1 Placement Confirmation: positive ETCO2 and breath sounds checked- equal and bilateral Tube secured with: Tape Dental Injury: Teeth and Oropharynx as per pre-operative assessment

## 2020-07-06 NOTE — Op Note (Signed)
Patient:  [name]  DOB:  June 23, 1951  MRN:  [id number]   Preop Diagnosis:  Right breast cancer  Postop Diagnosis:  Same  Procedure:  Right partial mastectomy after needle localization, sentinel lymph node biopsy  Surgeon:  Aviva Signs, MD   Anes:  GET  Indications:  Patient is a 69yo wf who was found on biopsy to have right breast cancer.  The risks and benefits of the procedure including bleeding, infection, cardiopulmonary difficulties, and the possibility of arm swelling were fully explained to the patient, who gives informed consent.  Procedure note:  The patient was placed in the supine position.  The patient had already undergone needle localization and radioactive nuclide injection preoperatively.  Blue dye was injected into the surrounding dermis at the needle site and massaged for five minutes.  The right breast and axilla were fully prepped and draped using the usual sterile technique with cholorprep.  Surgical site confirmation was performed.  An incision was made in the upper quadrant of the right breast to include the wire.  The dissection was taken down to the chest wall.  A short suture was place superiorly, and a long suture place laterally for orientation purposes.  Specimen radiography confirm that the cancer was within the specimen removed.  Next, a sentinel lymph node dissection was performed.  Using a Neoprobe, several small lymph nodes were removed.  Any bleeding controlled using small clips.  None were blue.  After removal, the axillary base count was less then 10% of the original counts.  Both incisions were injected with Exparel.  Both incisions closed with 3-0 Vicryl and 4-0 Monocryl subcuticular sutures.  Dermabond applied.   All tape and needle counts were correct at the end of the procedure.  The patient was extubated and transferred to PACU in stable condition.  Complications:  none  EBL:  minimal  Specimen:  Right breast tissue, right axillary sentinel  lymph nodes

## 2020-07-06 NOTE — Progress Notes (Signed)
Pt back from mammo

## 2020-07-06 NOTE — Progress Notes (Addendum)
Mammo notified pt ready for needle local. Mammo will call when ready.

## 2020-07-06 NOTE — Anesthesia Postprocedure Evaluation (Signed)
Anesthesia Post Note  Patient: MARGAUX ENGEN  Procedure(s) Performed: RIGHT PARTIAL MASTECTOMY WITH AXILLARY SENTINEL LYMPH NODE BIOPSY (Right Breast)  Patient location during evaluation: PACU Anesthesia Type: General Level of consciousness: awake, oriented, awake and alert and patient cooperative Pain management: satisfactory to patient Vital Signs Assessment: post-procedure vital signs reviewed and stable Respiratory status: spontaneous breathing, respiratory function stable, nonlabored ventilation and patient connected to face mask oxygen Cardiovascular status: stable Postop Assessment: no apparent nausea or vomiting Anesthetic complications: no   No complications documented.   Last Vitals:  Vitals:   07/06/20 0915 07/06/20 1108  BP: 133/67 (!) 146/70  Pulse: 77 83  Resp: 18 12  Temp:  36.8 C  SpO2: 97% 99%    Last Pain:  Vitals:   07/06/20 0909  TempSrc: Oral  PainSc: 0-No pain                 Artha Stavros

## 2020-07-06 NOTE — Progress Notes (Signed)
[facility] notified pt ready for sentinel node injection. Stated will be here for procedure.

## 2020-07-06 NOTE — Transfer of Care (Signed)
Immediate Anesthesia Transfer of Care Note  Patient: [name]  Procedure(s) Performed: RIGHT PARTIAL MASTECTOMY WITH AXILLARY SENTINEL LYMPH NODE BIOPSY (Right Breast)  Patient Location: PACU  Anesthesia Type:General  Level of Consciousness: awake, alert , oriented and patient cooperative  Airway & Oxygen Therapy: Patient Spontanous Breathing and Patient connected to face mask oxygen  Post-op Assessment: Report given to RN, Post -op Vital signs reviewed and stable and Patient moving all extremities X 4  Post vital signs: Reviewed and stable  Last Vitals:  Vitals Value Taken Time  BP    Temp    Pulse 83 07/06/20 1108  Resp 7 07/06/20 1108  SpO2 99 % 07/06/20 1108  Vitals shown include unvalidated device data.  Last Pain:  Vitals:   07/06/20 0909  TempSrc: Oral  PainSc: 0-No pain      Patients Stated Pain Goal: 8 (25/49/82 6415)  Complications: No complications documented.

## 2020-07-06 NOTE — Progress Notes (Signed)
To [facility] via w/c.

## 2020-07-06 NOTE — Anesthesia Preprocedure Evaluation (Signed)
Anesthesia Evaluation  Patient identified by MRN, date of birth, ID band Patient awake    Reviewed: Allergy & Precautions, H&P , NPO status , Patient's Chart, lab work & pertinent test results, reviewed documented beta blocker date and time   Airway Mallampati: II  TM Distance: >3 FB Neck ROM: full    Dental no notable dental hx.    Pulmonary COPD,  COPD inhaler, Current Smoker and Patient abstained from smoking.,    Pulmonary exam normal breath sounds clear to auscultation       Cardiovascular Exercise Tolerance: Good negative cardio ROS   Rhythm:regular Rate:Normal     Neuro/Psych Seizures -, Well Controlled,  PSYCHIATRIC DISORDERS Anxiety Bipolar Disorder CVA, Residual Symptoms    GI/Hepatic Neg liver ROS, PUD, GERD  Medicated,  Endo/Other  negative endocrine ROS  Renal/GU negative Renal ROS  negative genitourinary   Musculoskeletal   Abdominal   Peds  Hematology  (+) Blood dyscrasia, anemia ,   Anesthesia Other Findings   Reproductive/Obstetrics negative OB ROS                             Anesthesia Physical Anesthesia Plan  ASA: III  Anesthesia Plan: General   Post-op Pain Management:    Induction:   PONV Risk Score and Plan: Ondansetron  Airway Management Planned:   Additional Equipment:   Intra-op Plan:   Post-operative Plan:   Informed Consent: I have reviewed the patients History and Physical, chart, labs and discussed the procedure including the risks, benefits and alternatives for the proposed anesthesia with the patient or authorized representative who has indicated his/her understanding and acceptance.     Dental Advisory Given  Plan Discussed with: CRNA  Anesthesia Plan Comments:         Anesthesia Quick Evaluation

## 2020-07-07 ENCOUNTER — Encounter (HOSPITAL_COMMUNITY): Payer: Self-pay | Admitting: General Surgery

## 2020-07-08 LAB — SURGICAL PATHOLOGY

## 2020-07-12 ENCOUNTER — Encounter (HOSPITAL_COMMUNITY): Payer: Medicare Other

## 2020-07-13 ENCOUNTER — Telehealth: Payer: Self-pay | Admitting: Family Medicine

## 2020-07-13 MED ORDER — ONDANSETRON HCL 4 MG PO TABS: 4.0000 mg | ORAL_TABLET | Freq: Four times a day (QID) | ORAL | 0 refills | Status: DC | PRN

## 2020-07-13 NOTE — Telephone Encounter (Signed)
Pt called and states that she is having a lot of nausea and would like something called in for it if possible?

## 2020-07-13 NOTE — Telephone Encounter (Signed)
Per Dr. Arnoldo Morale ok to do Zofran 4mg  po q 6 hours prn N&V. Med sent to pharm.

## 2020-07-14 ENCOUNTER — Encounter: Payer: Self-pay | Admitting: General Surgery

## 2020-07-14 ENCOUNTER — Ambulatory Visit (INDEPENDENT_AMBULATORY_CARE_PROVIDER_SITE_OTHER): Payer: Medicare Other | Admitting: General Surgery

## 2020-07-14 ENCOUNTER — Other Ambulatory Visit: Payer: Self-pay

## 2020-07-14 VITALS — BP 149/77 | HR 81 | Temp 98.6°F | Resp 18 | Ht 63.5 in | Wt 133.0 lb

## 2020-07-14 DIAGNOSIS — Z09 Encounter for follow-up examination after completed treatment for conditions other than malignant neoplasm: Secondary | ICD-10-CM

## 2020-07-14 NOTE — Progress Notes (Signed)
Subjective:     [name]  Patient here for postoperative visit.  Did have some nausea, which did resolve with Zofran.  She has mild incisional pain. Objective:    BP (!) 149/77   Pulse 81   Temp 98.6 F (37 C) (Oral)   Resp 18   Ht 5' 3.5" (1.613 m)   Wt 133 lb (60.3 kg)   SpO2 97%   BMI 23.19 kg/m   General:  alert, cooperative and no distress  Right breast healing well.  Incisions healing well.  No swelling present. Final pathology shows a 1.2 cm carcinoma of the right breast with a positive superior margin.  3 sentinel lymph nodes negative for malignancy.     Assessment:    Doing well postoperatively. Positive superior margin on pathologic examination    Plan:   We will take patient back to the operating room on 07/20/2020 for reexcision of the superior margin.  The risks and benefits of the procedure were fully explained to the patient, who gave informed consent.

## 2020-07-14 NOTE — H&P (Signed)
TISH BEGIN; [id number]; July 21, 1951   HPI Patient is a 69 year old white female recently underwent a right partial mastectomy after needle localization with sentinel lymph node biopsy.  Final pathology revealed carcinoma at the superior margin.  Sentinel lymph nodes were negative.  She now presents back for reexcision of the superior margin. Past Medical History:  Diagnosis Date   Alcohol abuse, in remission    Anxiety    Bipolar disorder ([place])    Colitis    COPD (chronic obstructive pulmonary disease) (HCC)    occasional home O2   Gastric ulcer    Hemorrhoids    IBS (irritable bowel syndrome)    Seizures ([place])    last seizure was about a year ago    Past Surgical History:  Procedure Laterality Date   ABDOMINAL HYSTERECTOMY     CHOLECYSTECTOMY     COMPRESSION HIP SCREW Right 04/25/2019   Procedure: COMPRESSION HIP;  Surgeon: Carole Civil, MD;  Location: [facility];  Service: Orthopedics;  Laterality: Right;  1030am   EYE SURGERY      Family History  Problem Relation Age of Onset   Dementia Father     Current Outpatient Medications on File Prior to Visit  Medication Sig Dispense Refill   albuterol (PROVENTIL HFA;VENTOLIN HFA) 108 (90 BASE) MCG/ACT inhaler Inhale 2 puffs into the lungs every 6 (six) hours as needed for wheezing or shortness of breath.      albuterol (PROVENTIL) (2.5 MG/3ML) 0.083% nebulizer solution Take 2.5 mg by nebulization every 6 (six) hours as needed for wheezing or shortness of breath.      atorvastatin (LIPITOR) 20 MG tablet Take 1 tablet by mouth every evening.      cholecalciferol (VITAMIN D3) 25 MCG (1000 UT) tablet Take 1,000 Units by mouth daily.     clonazePAM (KLONOPIN) 1 MG tablet Take 1 tablet (1 mg total) by mouth 3 (three) times daily.     dicyclomine (BENTYL) 10 MG capsule Take 10 mg by mouth 2 (two) times daily.     mirtazapine (REMERON) 30 MG tablet Take 1 tablet (30 mg total) by mouth at bedtime. 30 tablet 1    nicotine (NICODERM CQ - DOSED IN MG/24 HOURS) 14 mg/24hr patch Place 1 patch (14 mg total) onto the skin daily. 28 patch 0   omeprazole (PRILOSEC) 20 MG capsule Take 1 capsule by mouth daily.     phenytoin (DILANTIN) 100 MG ER capsule Take 400 mg by mouth at bedtime.      polyethylene glycol (MIRALAX) 17 g packet Take 17 g by mouth daily as needed for mild constipation. 28 each 0   tiZANidine (ZANAFLEX) 4 MG tablet Take 4 mg by mouth every 8 (eight) hours as needed for muscle spasms.      traMADol-acetaminophen (ULTRACET) 37.5-325 MG tablet Take 1 tablet by mouth every 4 (four) hours as needed. 90 tablet 5   traZODone (DESYREL) 100 MG tablet Take 1 tablet by mouth every evening.     No current facility-administered medications on file prior to visit.    Allergies  Allergen Reactions   Chantix [Varenicline] Hives   Codeine Nausea And Vomiting   Flu Virus Vaccine Nausea And Vomiting   Lithium Nausea And Vomiting    Social History   Substance and Sexual Activity  Alcohol Use No   Comment: h/o heavy use, quit in 1995    Social History   Tobacco Use  Smoking Status Current Every Day Smoker   Packs/day: 1.00  Years: 32.00   Pack years: 32.00   Types: Cigarettes  Smokeless Tobacco Never Used    Review of Systems  Constitutional: Positive for malaise/fatigue.  HENT: Positive for sinus pain.   Eyes: Negative.   Respiratory: Positive for shortness of breath.   Cardiovascular: Negative.   Gastrointestinal: Positive for nausea.  Genitourinary: Negative.   Musculoskeletal: Positive for joint pain.  Skin: Negative.   Neurological: Negative.   Endo/Heme/Allergies: Negative.   Psychiatric/Behavioral: Negative.     Objective   Vitals:   06/14/20 1123  BP: 137/76  Pulse: 84  Resp: 12  Temp: 98.9 F (37.2 C)  SpO2: 94%    Physical Exam Vitals reviewed.  Constitutional:      Appearance: Normal appearance. She is normal weight. She is not ill-appearing.   HENT:     Head: Normocephalic and atraumatic.  Cardiovascular:     Rate and Rhythm: Normal rate and regular rhythm.     Heart sounds: Normal heart sounds. No murmur heard.  No friction rub. No gallop.   Pulmonary:     Effort: No respiratory distress.     Breath sounds: Normal breath sounds. No stridor. No wheezing, rhonchi or rales.     Comments: Heart sounds distant. Lymphadenopathy:     Cervical: No cervical adenopathy.  Skin:    General: Skin is warm and dry.  Neurological:     Mental Status: She is alert.   Breast: Right breast incision well-healed along the superior aspect of the breast.  Mammography and pathology reports reviewed Assessment  Newly diagnosed invasive ductal carcinoma of right breast, COPD with occasional home O2 use Unclear superior margin of the right partial mastectomy site Plan   Patient is scheduled for reexcision of the right partial mastectomy site on 07/20/2020.  The risks and benefits of the procedure including bleeding, infection, and unclear margins were fully explained to the patient, who gave informed consent.

## 2020-07-15 ENCOUNTER — Ambulatory Visit: Payer: Medicare Other | Admitting: Gastroenterology

## 2020-07-15 ENCOUNTER — Encounter (HOSPITAL_COMMUNITY)
Admission: RE | Admit: 2020-07-15 | Discharge: 2020-07-15 | Disposition: A | Discharge status: Home / Self Care | Payer: Medicare Other | Source: Ambulatory Visit | Attending: General Surgery | Admitting: General Surgery

## 2020-07-15 ENCOUNTER — Encounter (HOSPITAL_COMMUNITY): Payer: Self-pay

## 2020-07-18 ENCOUNTER — Other Ambulatory Visit: Payer: Self-pay

## 2020-07-18 ENCOUNTER — Other Ambulatory Visit (HOSPITAL_COMMUNITY)
Admission: RE | Admit: 2020-07-18 | Discharge: 2020-07-18 | Disposition: A | Discharge status: Home / Self Care | Payer: Medicare Other | Source: Ambulatory Visit | Attending: General Surgery | Admitting: General Surgery

## 2020-07-18 DIAGNOSIS — Z01812 Encounter for preprocedural laboratory examination: Secondary | ICD-10-CM | POA: Insufficient documentation

## 2020-07-18 DIAGNOSIS — Z20822 Contact with and (suspected) exposure to covid-19: Secondary | ICD-10-CM | POA: Insufficient documentation

## 2020-07-18 LAB — SARS CORONAVIRUS 2 (TAT 6-24 HRS): SARS Coronavirus 2: NEGATIVE

## 2020-07-20 ENCOUNTER — Ambulatory Visit (HOSPITAL_COMMUNITY): Payer: Medicare Other | Admitting: Anesthesiology

## 2020-07-20 ENCOUNTER — Ambulatory Visit (HOSPITAL_COMMUNITY)
Admission: RE | Admit: 2020-07-20 | Discharge: 2020-07-20 | Disposition: A | Discharge status: Home / Self Care | Payer: Medicare Other | Attending: General Surgery | Admitting: General Surgery

## 2020-07-20 ENCOUNTER — Encounter (HOSPITAL_COMMUNITY)
Admission: RE | Disposition: A | Discharge status: Home / Self Care | Payer: Self-pay | Source: Home / Self Care | Attending: General Surgery

## 2020-07-20 ENCOUNTER — Encounter (HOSPITAL_COMMUNITY): Payer: Self-pay | Admitting: General Surgery

## 2020-07-20 DIAGNOSIS — K219 Gastro-esophageal reflux disease without esophagitis: Secondary | ICD-10-CM | POA: Diagnosis not present

## 2020-07-20 DIAGNOSIS — M1990 Unspecified osteoarthritis, unspecified site: Secondary | ICD-10-CM | POA: Diagnosis not present

## 2020-07-20 DIAGNOSIS — J449 Chronic obstructive pulmonary disease, unspecified: Secondary | ICD-10-CM | POA: Insufficient documentation

## 2020-07-20 DIAGNOSIS — C50911 Malignant neoplasm of unspecified site of right female breast: Secondary | ICD-10-CM | POA: Diagnosis not present

## 2020-07-20 DIAGNOSIS — F17210 Nicotine dependence, cigarettes, uncomplicated: Secondary | ICD-10-CM | POA: Insufficient documentation

## 2020-07-20 DIAGNOSIS — F319 Bipolar disorder, unspecified: Secondary | ICD-10-CM | POA: Diagnosis not present

## 2020-07-20 DIAGNOSIS — Z9981 Dependence on supplemental oxygen: Secondary | ICD-10-CM | POA: Diagnosis not present

## 2020-07-20 DIAGNOSIS — Z885 Allergy status to narcotic agent status: Secondary | ICD-10-CM | POA: Diagnosis not present

## 2020-07-20 DIAGNOSIS — C50211 Malignant neoplasm of upper-inner quadrant of right female breast: Secondary | ICD-10-CM | POA: Diagnosis not present

## 2020-07-20 DIAGNOSIS — F419 Anxiety disorder, unspecified: Secondary | ICD-10-CM | POA: Diagnosis not present

## 2020-07-20 DIAGNOSIS — K589 Irritable bowel syndrome without diarrhea: Secondary | ICD-10-CM | POA: Diagnosis not present

## 2020-07-20 DIAGNOSIS — Z887 Allergy status to serum and vaccine status: Secondary | ICD-10-CM | POA: Diagnosis not present

## 2020-07-20 DIAGNOSIS — Z888 Allergy status to other drugs, medicaments and biological substances status: Secondary | ICD-10-CM | POA: Diagnosis not present

## 2020-07-20 DIAGNOSIS — Z79899 Other long term (current) drug therapy: Secondary | ICD-10-CM | POA: Insufficient documentation

## 2020-07-20 HISTORY — PX: MASTECTOMY, PARTIAL: SHX709

## 2020-07-20 SURGERY — MASTECTOMY PARTIAL
Anesthesia: General | Site: Chest | Laterality: Right

## 2020-07-20 MED ORDER — MEPERIDINE HCL 50 MG/ML IJ SOLN: 6.2500 mg | INTRAMUSCULAR | Status: DC | PRN

## 2020-07-20 MED ORDER — LACTATED RINGERS IV SOLN: INTRAVENOUS | Status: DC | PRN

## 2020-07-20 MED ORDER — MIDAZOLAM HCL 5 MG/5ML IJ SOLN
INTRAMUSCULAR | Status: DC | PRN
  Administered 2020-07-20: 1 mg via INTRAVENOUS

## 2020-07-20 MED ORDER — DEXAMETHASONE SODIUM PHOSPHATE 10 MG/ML IJ SOLN
INTRAMUSCULAR | Status: AC
  Filled 2020-07-20: qty 1

## 2020-07-20 MED ORDER — FENTANYL CITRATE (PF) 100 MCG/2ML IJ SOLN
INTRAMUSCULAR | Status: DC | PRN
Start: 2020-07-20 — End: 2020-07-20
  Administered 2020-07-20 (×2): 25 ug via INTRAVENOUS

## 2020-07-20 MED ORDER — PROPOFOL 10 MG/ML IV BOLUS
INTRAVENOUS | Status: DC | PRN
  Administered 2020-07-20: 100 mg via INTRAVENOUS

## 2020-07-20 MED ORDER — GLYCOPYRROLATE PF 0.2 MG/ML IJ SOSY
PREFILLED_SYRINGE | INTRAMUSCULAR | Status: DC | PRN
  Administered 2020-07-20: .1 mg via INTRAVENOUS

## 2020-07-20 MED ORDER — BUPIVACAINE HCL (PF) 0.5 % IJ SOLN
INTRAMUSCULAR | Status: AC
  Filled 2020-07-20: qty 30

## 2020-07-20 MED ORDER — ORAL CARE MOUTH RINSE: 15.0000 mL | Freq: Once | OROMUCOSAL | Status: AC

## 2020-07-20 MED ORDER — KETOROLAC TROMETHAMINE 30 MG/ML IJ SOLN
15.0000 mg | Freq: Once | INTRAMUSCULAR | Status: AC
  Administered 2020-07-20: 15 mg via INTRAVENOUS

## 2020-07-20 MED ORDER — ONDANSETRON HCL 4 MG/2ML IJ SOLN: 4.0000 mg | Freq: Once | INTRAMUSCULAR | Status: DC | PRN

## 2020-07-20 MED ORDER — KETOROLAC TROMETHAMINE 30 MG/ML IJ SOLN
INTRAMUSCULAR | Status: AC
  Filled 2020-07-20: qty 1

## 2020-07-20 MED ORDER — PHENYLEPHRINE 40 MCG/ML (10ML) SYRINGE FOR IV PUSH (FOR BLOOD PRESSURE SUPPORT)
PREFILLED_SYRINGE | INTRAVENOUS | Status: AC
  Filled 2020-07-20: qty 10

## 2020-07-20 MED ORDER — MIDAZOLAM HCL 2 MG/2ML IJ SOLN
INTRAMUSCULAR | Status: AC
  Filled 2020-07-20: qty 2

## 2020-07-20 MED ORDER — DEXAMETHASONE SODIUM PHOSPHATE 4 MG/ML IJ SOLN
INTRAMUSCULAR | Status: DC | PRN
  Administered 2020-07-20: 6 mg via INTRAVENOUS

## 2020-07-20 MED ORDER — ONDANSETRON HCL 4 MG/2ML IJ SOLN
INTRAMUSCULAR | Status: DC | PRN
  Administered 2020-07-20: 4 mg via INTRAVENOUS

## 2020-07-20 MED ORDER — LACTATED RINGERS IV SOLN: Freq: Once | INTRAVENOUS | Status: AC

## 2020-07-20 MED ORDER — OXYCODONE-ACETAMINOPHEN 10-325 MG PO TABS: 1.0000 | ORAL_TABLET | Freq: Four times a day (QID) | ORAL | 0 refills | Status: DC | PRN

## 2020-07-20 MED ORDER — ONDANSETRON HCL 4 MG/2ML IJ SOLN
INTRAMUSCULAR | Status: AC
  Filled 2020-07-20: qty 2

## 2020-07-20 MED ORDER — HYDROMORPHONE HCL 1 MG/ML IJ SOLN: 0.2500 mg | INTRAMUSCULAR | Status: DC | PRN

## 2020-07-20 MED ORDER — SODIUM CHLORIDE 0.9 % IR SOLN
Status: DC | PRN
  Administered 2020-07-20: 1000 mL

## 2020-07-20 MED ORDER — CEFAZOLIN SODIUM-DEXTROSE 2-4 GM/100ML-% IV SOLN
2.0000 g | INTRAVENOUS | Status: AC
  Administered 2020-07-20: 2 g via INTRAVENOUS
  Filled 2020-07-20: qty 100

## 2020-07-20 MED ORDER — LIDOCAINE 2% (20 MG/ML) 5 ML SYRINGE
INTRAMUSCULAR | Status: DC | PRN
  Administered 2020-07-20: 100 mg via INTRAVENOUS

## 2020-07-20 MED ORDER — BUPIVACAINE HCL (PF) 0.5 % IJ SOLN
INTRAMUSCULAR | Status: DC | PRN
  Administered 2020-07-20: 6 mL

## 2020-07-20 MED ORDER — LIDOCAINE 2% (20 MG/ML) 5 ML SYRINGE
INTRAMUSCULAR | Status: AC
  Filled 2020-07-20: qty 5

## 2020-07-20 MED ORDER — CHLORHEXIDINE GLUCONATE CLOTH 2 % EX PADS: 6.0000 | MEDICATED_PAD | Freq: Once | CUTANEOUS | Status: DC

## 2020-07-20 MED ORDER — PROPOFOL 10 MG/ML IV BOLUS
INTRAVENOUS | Status: AC
  Filled 2020-07-20: qty 20

## 2020-07-20 MED ORDER — FENTANYL CITRATE (PF) 250 MCG/5ML IJ SOLN
INTRAMUSCULAR | Status: AC
Start: 2020-07-20 — End: ?
  Filled 2020-07-20: qty 5

## 2020-07-20 MED ORDER — GLYCOPYRROLATE PF 0.2 MG/ML IJ SOSY
PREFILLED_SYRINGE | INTRAMUSCULAR | Status: AC
  Filled 2020-07-20: qty 1

## 2020-07-20 MED ORDER — CHLORHEXIDINE GLUCONATE 0.12 % MT SOLN
15.0000 mL | Freq: Once | OROMUCOSAL | Status: AC
  Administered 2020-07-20: 15 mL via OROMUCOSAL
  Filled 2020-07-20: qty 15

## 2020-07-20 SURGICAL SUPPLY — 29 items
ADH SKN CLS APL DERMABOND .7 (GAUZE/BANDAGES/DRESSINGS) ×1
APL PRP STRL LF DISP 70% ISPRP (MISCELLANEOUS) ×1
CHLORAPREP W/TINT 26 (MISCELLANEOUS) ×3 IMPLANT
CLOTH BEACON ORANGE TIMEOUT ST (SAFETY) ×3 IMPLANT
COVER LIGHT HANDLE STERIS (MISCELLANEOUS) ×6 IMPLANT
COVER WAND RF STERILE (DRAPES) ×3 IMPLANT
DECANTER SPIKE VIAL GLASS SM (MISCELLANEOUS) ×3 IMPLANT
DERMABOND ADVANCED (GAUZE/BANDAGES/DRESSINGS) ×2
DERMABOND ADVANCED .7 DNX12 (GAUZE/BANDAGES/DRESSINGS) ×1 IMPLANT
ELECT REM PT RETURN 9FT ADLT (ELECTROSURGICAL) ×3
ELECTRODE REM PT RTRN 9FT ADLT (ELECTROSURGICAL) ×1 IMPLANT
GLOVE BIOGEL PI IND STRL 7.0 (GLOVE) ×2 IMPLANT
GLOVE BIOGEL PI INDICATOR 7.0 (GLOVE) ×4
GLOVE SURG SS PI 7.5 STRL IVOR (GLOVE) ×3 IMPLANT
GOWN STRL REUS W/TWL LRG LVL3 (GOWN DISPOSABLE) ×6 IMPLANT
KIT TURNOVER KIT A (KITS) ×3 IMPLANT
MANIFOLD NEPTUNE II (INSTRUMENTS) ×3 IMPLANT
NDL HYPO 25X1 1.5 SAFETY (NEEDLE) ×1 IMPLANT
NEEDLE HYPO 25X1 1.5 SAFETY (NEEDLE) ×3 IMPLANT
NS IRRIG 1000ML POUR BTL (IV SOLUTION) ×3 IMPLANT
PACK MINOR (CUSTOM PROCEDURE TRAY) ×3 IMPLANT
PAD ARMBOARD 7.5X6 YLW CONV (MISCELLANEOUS) ×3 IMPLANT
PENCIL SMOKE EVACUATOR (MISCELLANEOUS) ×3 IMPLANT
SET BASIN LINEN APH (SET/KITS/TRAYS/PACK) ×3 IMPLANT
SPONGE LAP 18X18 RF (DISPOSABLE) ×3 IMPLANT
SUT MNCRL AB 4-0 PS2 18 (SUTURE) ×3 IMPLANT
SUT VIC AB 3-0 SH 27 (SUTURE) ×3
SUT VIC AB 3-0 SH 27X BRD (SUTURE) ×1 IMPLANT
SYR CONTROL 10ML LL (SYRINGE) ×3 IMPLANT

## 2020-07-20 NOTE — Anesthesia Postprocedure Evaluation (Signed)
Anesthesia Post Note  Patient: [name]  Procedure(s) Performed: MASTECTOMY PARTIAL (Right Chest)  Patient location during evaluation: PACU Anesthesia Type: General Level of consciousness: awake and alert and oriented Pain management: pain level controlled Vital Signs Assessment: post-procedure vital signs reviewed and stable Respiratory status: spontaneous breathing Cardiovascular status: blood pressure returned to baseline Postop Assessment: no headache and no apparent nausea or vomiting Anesthetic complications: no   No complications documented.   Last Vitals:  Vitals:   07/20/20 0859 07/20/20 1030  BP: 122/68 140/75  Pulse: 81 72  Resp: 14 16  Temp: 36.9 C 36.5 C  SpO2: 96% 99%    Last Pain:  Vitals:   07/20/20 1030  TempSrc:   PainSc: [place]

## 2020-07-20 NOTE — Discharge Instructions (Signed)
Skin Sparing Mastectomy, Care After This sheet gives you information about how to care for yourself after your procedure. Your health care provider may also give you more specific instructions. If you have problems or questions, contact your health care provider. What can I expect after the procedure? After the procedure, it is common to have:  Breast swelling.  Breast tenderness.  Stiffness in your arm or shoulder.  A change in breast shape.  A change in how the breast feels.  Pain.  Numbness or tingling. Follow these instructions at home: Medicines  Take over-the-counter and prescription medicines only as told by your health care provider.  Take your antibiotic medicine as told by your health care provider. Do not  stop taking the antibiotic even if you start to feel better.  To prevent or treat constipation while you are taking prescription pain medicine, your health care provider may recommend that you: ? Drink enough fluid to keep your urine clear or pale yellow. ? Take over-the-counter or prescription medicines. ? Eat foods that are high in fiber, such as fresh fruits and vegetables, whole grains, and beans. ? Limit foods that are high in fat and processed sugars, such as fried and sweet foods. Bathing  Take sponge baths until your health care provider says that you can start showering or bathing.  Do not take baths, swim, or use a hot tub until your health care provider approves. Activity  Return to your normal activities as told by your health care provider. Ask your health care provider what activities are safe for you.  Avoid exercise that requires a lot of effort (is strenuous).  Be careful to avoid any activities that could cause an injury to the arm that is on the side of your surgery.  Do not lift anything that is heavier than 10 lb (4.5 kg). Avoid lifting with the arm that is on the side of your surgery.  Ask friends and family to help with child care, meal  preparation, laundry, and shopping. Driving  Do not  drive or use heavy machinery while taking prescription pain medicine.  Do not drive until your health care provider approves. Incision care   Avoid wearing a bra following your procedure to allow the incision to heal. Your health care provider will tell you when it is safe to wear a bra.  Follow instructions from your health care provider about how to take care of your incision. Make sure you: ? Wash your hands with soap and water before you change your bandage (dressing). If soap and water are not available, use hand sanitizer. ? Change your dressing as told by your health care provider. ? Keep your dressing dry. ? Leave stitches (sutures), skin glue, or adhesive strips in place. These skin closures may need to stay in place for 2 weeks or longer. If adhesive strip edges start to loosen and curl up, you may trim the loose edges. Do not remove adhesive strips completely unless your health care provider tells you to do that.  Check your incision area every day for signs of infection. Check for: ? Redness, swelling, or pain. ? Fluid or blood. ? Warmth. ? Pus or a bad smell. General instructions  If you have a drain, empty the fluid from the removable drain bulb as directed by your health care provider.  Keep all follow-up visits as told by your health care provider. This is important. Contact a health care provider if:  You cannot eat or drink without vomiting.  You have redness, swelling, or pain around your incision or drain.  You have fluid or blood coming from your incision or drain.  Your incision or drain area feels warm to the touch.  You have pus or a bad smell coming from your incision or drain. Get help right away if:  You have a fever.  You have new or sudden chest pain.  You have chest pain or trouble breathing that gets worse. Summary  Take your antibiotic medicine as told by your health care provider. Do  not stop taking the antibiotic even if you start to feel better.  Check your incision area and drain area every day for signs of infection, such as redness, swelling, pain, fluid, blood, warmth, pus, or a bad smell.  Do not drive or use heavy machinery while taking prescription pain medicine.  Keep all follow-up visits as told by your health care provider. This is important. This information is not intended to replace advice given to you by your health care provider. Make sure you discuss any questions you have with your health care provider. Document Revised: 09/19/2016 Document Reviewed: 09/19/2016 Elsevier Patient Education  2020 [employer].

## 2020-07-20 NOTE — Anesthesia Procedure Notes (Signed)
Procedure Name: LMA Insertion Date/Time: 07/20/2020 9:52 AM Performed by: Orlie Dakin, CRNA Pre-anesthesia Checklist: Patient identified, Emergency Drugs available, Suction available and Patient being monitored Patient Re-evaluated:Patient Re-evaluated prior to induction Oxygen Delivery Method: Circle system utilized Preoxygenation: Pre-oxygenation with 100% oxygen Induction Type: IV induction Ventilation: Mask ventilation without difficulty LMA: LMA inserted LMA Size: 3.0 Tube type: Oral Number of attempts: 1 Placement Confirmation: positive ETCO2 Tube secured with: Tape Dental Injury: Teeth and Oropharynx as per pre-operative assessment

## 2020-07-20 NOTE — Interval H&P Note (Signed)
History and Physical Interval Note:  07/20/2020 8:59 AM  [name]  has presented today for surgery, with the diagnosis of Right Breast Cancer.  The various methods of treatment have been discussed with the patient and family. After consideration of risks, benefits and other options for treatment, the patient has consented to  Procedure(s): MASTECTOMY PARTIAL (Right) as a surgical intervention.  The patient's history has been reviewed, patient examined, no change in status, stable for surgery.  I have reviewed the patient's chart and labs.  Questions were answered to the patient's satisfaction.     Aviva Signs

## 2020-07-20 NOTE — Transfer of Care (Signed)
Immediate Anesthesia Transfer of Care Note  Patient: [name]  Procedure(s) Performed: MASTECTOMY PARTIAL (Right Chest)  Patient Location: PACU  Anesthesia Type:General  Level of Consciousness: awake and patient cooperative  Airway & Oxygen Therapy: Patient Spontanous Breathing and Patient connected to face mask oxygen  Post-op Assessment: Report given to RN and Post -op Vital signs reviewed and stable  Post vital signs: Reviewed and stable  Last Vitals:  Vitals Value Taken Time  BP 147/72 07/20/20 1027  Temp    Pulse 72 07/20/20 1030  Resp 16 07/20/20 1030  SpO2 99 % 07/20/20 1030  Vitals shown include unvalidated device data.  Last Pain:  Vitals:   07/20/20 0859  TempSrc: Oral  PainSc: 0-No pain      Patients Stated Pain Goal: 5 (84/57/33 [id number])  Complications: No complications documented.

## 2020-07-20 NOTE — Anesthesia Preprocedure Evaluation (Addendum)
Anesthesia Evaluation  Patient identified by MRN, date of birth, ID band Patient awake    Reviewed: Allergy & Precautions, NPO status , Patient's Chart, lab work & pertinent test results  History of Anesthesia Complications Negative for: history of anesthetic complications  Airway Mallampati: II  TM Distance: >3 FB Neck ROM: Full    Dental  (+) Edentulous Upper, Edentulous Lower   Pulmonary shortness of breath and with exertion, COPD (oxygen as needed),  COPD inhaler and oxygen dependent, Current SmokerPatient did not abstain from smoking.,    Pulmonary exam normal breath sounds clear to auscultation       Cardiovascular Exercise Tolerance: Poor Normal cardiovascular exam Rhythm:Regular Rate:Normal     Neuro/Psych Seizures -, Well Controlled,  PSYCHIATRIC DISORDERS Anxiety Bipolar Disorder CVA, Residual Symptoms    GI/Hepatic PUD, GERD  Medicated,(+)     substance abuse (h/o alcohol abuse)  alcohol use and marijuana use,   Endo/Other  negative endocrine ROS  Renal/GU negative Renal ROS     Musculoskeletal  (+) Arthritis ,   Abdominal   Peds  Hematology  (+) anemia ,   Anesthesia Other Findings   Reproductive/Obstetrics                           Anesthesia Physical Anesthesia Plan  ASA: III  Anesthesia Plan: General   Post-op Pain Management:    Induction: Intravenous  PONV Risk Score and Plan: 2 and Ondansetron  Airway Management Planned: LMA  Additional Equipment:   Intra-op Plan:   Post-operative Plan: Extubation in OR  Informed Consent: I have reviewed the patients History and Physical, chart, labs and discussed the procedure including the risks, benefits and alternatives for the proposed anesthesia with the patient or authorized representative who has indicated his/her understanding and acceptance.     Dental advisory given  Plan Discussed with: CRNA and  Surgeon  Anesthesia Plan Comments:        Anesthesia Quick Evaluation

## 2020-07-20 NOTE — Op Note (Signed)
Patient:  [name]  DOB:  [birth year]  MRN:  [id number]   Preop Diagnosis: Right breast carcinoma  Postop Diagnosis: Same  Procedure: Right partial mastectomy  Surgeon: Aviva Signs, MD  Anes: General  Indications: Patient is a 69 year old white female who underwent a right partial mastectomy with sentinel lymph node biopsy recently.  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  The patient now presents back for a repeat right partial mastectomy.  The risks and benefits of the procedure including bleeding, infection, and unclear margins were fully explained to the patient, who gave informed consent.  Procedure note: The patient was placed in the supine position.  After general anesthesia was administered, the right breast was prepped and draped using the usual sterile technique with ChloraPrep.  Surgical site confirmation was performed.  An incision was made through the previous surgical incision site along the superior aspect of the right breast.  Once this was opened, additional tissue was taken along the superior margin of the mastectomy cavity.  A short suture was placed superiorly and a long suture placed laterally for orientation purposes.  The specimen was sent to pathology further examination.  A bleeding was controlled using Bovie electrocautery.  The subcutaneous layer was reapproximated using a 3-0 Vicryl interrupted suture.  The skin was closed using a 4-0 Monocryl subcuticular suture.  0.5% Sensorcaine was instilled into the surrounding wound.  Dermabond was applied.  All tape and needle counts were correct at the end of the procedure.  The patient was awakened and transferred to PACU in stable condition.  Complications: None  EBL: Minimal  Specimen: Right breast tissue, superior margin

## 2020-07-21 ENCOUNTER — Encounter (HOSPITAL_COMMUNITY): Payer: Self-pay | Admitting: General Surgery

## 2020-07-22 LAB — SURGICAL PATHOLOGY

## 2020-07-26 ENCOUNTER — Ambulatory Visit (INDEPENDENT_AMBULATORY_CARE_PROVIDER_SITE_OTHER): Payer: Medicare Other | Admitting: General Surgery

## 2020-07-26 ENCOUNTER — Other Ambulatory Visit: Payer: Self-pay

## 2020-07-26 ENCOUNTER — Encounter: Payer: Self-pay | Admitting: General Surgery

## 2020-07-26 VITALS — BP 137/78 | HR 84 | Temp 98.6°F | Resp 18 | Ht 60.35 in | Wt 135.0 lb

## 2020-07-26 DIAGNOSIS — Z09 Encounter for follow-up examination after completed treatment for conditions other than malignant neoplasm: Secondary | ICD-10-CM

## 2020-07-26 MED ORDER — ONDANSETRON HCL 4 MG PO TABS: 4.0000 mg | ORAL_TABLET | Freq: Four times a day (QID) | ORAL | 0 refills | Status: DC | PRN

## 2020-07-26 MED ORDER — OXYCODONE-ACETAMINOPHEN 10-325 MG PO TABS
1.0000 | ORAL_TABLET | Freq: Four times a day (QID) | ORAL | 0 refills | Status: DC | PRN
Start: 2020-07-26 — End: 2020-12-09

## 2020-07-26 NOTE — Progress Notes (Signed)
Subjective:     [name]  Here for postoperative visit.  Doing well.  Has mild incisional pain and nausea. Objective:    BP 137/78    Pulse 84    Temp 98.6 F (37 C) (Oral)    Resp 18    Ht 5' 0.35" (1.533 m)    Wt 135 lb (61.2 kg)    SpO2 96%    BMI 26.06 kg/m   General:  alert, cooperative and no distress  Right breast incision healing well. Final pathology revealed no residual carcinoma.     Assessment:    Doing well postoperatively.    Plan:   Have reordered her Zofran and Percocet.  Will refer patient to Dr. Delton Coombes of oncology for further management and treatment.  Follow-up here as needed.

## 2020-08-01 ENCOUNTER — Ambulatory Visit (HOSPITAL_COMMUNITY): Payer: Medicare Other | Admitting: Hematology

## 2020-08-09 ENCOUNTER — Encounter (HOSPITAL_COMMUNITY): Payer: Self-pay

## 2020-08-09 NOTE — Progress Notes (Signed)
I placed an introductory phone call to this patient today prior to her initial visit with Dr. Delton Coombes. I introduced myself and explained my role in the patient's care. I explained how to get to the [place], patient verbalized understanding. Patient reports that she is doing well after surgery, her pain is manageable and she reports being able to get some rest with the pain medication she was prescribed post surgery. Patient reports no known family history that she can recall at this time. Patient reports no known barriers to care at this time. I provided my contact information and will plan to meet with the patient and her husband during her initial visit with Dr. Delton Coombes.

## 2020-08-11 ENCOUNTER — Other Ambulatory Visit: Payer: Self-pay

## 2020-08-11 ENCOUNTER — Inpatient Hospital Stay (HOSPITAL_COMMUNITY): Payer: Medicare Other | Attending: Hematology | Admitting: Hematology

## 2020-08-11 VITALS — BP 109/64 | HR 82 | Resp 18 | Ht 63.5 in | Wt 135.9 lb

## 2020-08-11 DIAGNOSIS — C50211 Malignant neoplasm of upper-inner quadrant of right female breast: Secondary | ICD-10-CM | POA: Insufficient documentation

## 2020-08-11 DIAGNOSIS — R569 Unspecified convulsions: Secondary | ICD-10-CM | POA: Diagnosis not present

## 2020-08-11 DIAGNOSIS — M810 Age-related osteoporosis without current pathological fracture: Secondary | ICD-10-CM | POA: Diagnosis not present

## 2020-08-11 DIAGNOSIS — Z170 Estrogen receptor positive status [ER+]: Secondary | ICD-10-CM | POA: Insufficient documentation

## 2020-08-11 DIAGNOSIS — F17210 Nicotine dependence, cigarettes, uncomplicated: Secondary | ICD-10-CM | POA: Insufficient documentation

## 2020-08-11 MED ORDER — TAMOXIFEN CITRATE 20 MG PO TABS: 20.0000 mg | ORAL_TABLET | Freq: Every day | ORAL | 3 refills | Status: DC

## 2020-08-11 NOTE — Progress Notes (Signed)
[facility] [address], [place] [address]   Patient Care Team: Redmond School, MD as PCP - General (Internal Medicine) Eloise Harman, DO as Consulting Physician (Internal Medicine) Eloise Harman, DO as Consulting Physician (Internal Medicine) Dishmon, Garwin Brothers, RN as Oncology Nurse Navigator (Oncology)  CHIEF COMPLAINTS/PURPOSE OF CONSULTATION:  Newly diagnosed right breast cancer  HISTORY OF PRESENTING ILLNESS:  [name] 69 y.o. female is here because of recent diagnosis of right breast cancer, at the request of Dr. Aviva Signs. She had a right partial mastectomy with axillary SLNB on 9/8 with Dr. Arnoldo Morale, then a partial right mastectomy on 9/22.  Today she reports that this is her first bout with cancer. She has never had a breast biopsy before. She uses a nebulizer TID and a rescue inhaler as needed. She has occasional numbness and tingling in her hands. She is taking Fosamax weekly for her osteoporosis. She has seizures which is controlled with phenytoin; her last episode was over a year ago.  She denies history of cancer in her family. She lives at home with her husband and son. She used to work at a [employer]. She continues smoking 1/2 PPD for 40 years.  I reviewed her records extensively and collaborated the history with the patient.  SUMMARY OF ONCOLOGIC HISTORY: Oncology History   No history exists.    In terms of breast cancer risk profile:  She menarched at early age of 64 and had hysterectomy at age 1.   She had 4 pregnancy, her first child was born at age 61.  She had received birth control pills for approximately 1 year.  She was never exposed to fertility medications or hormone replacement therapy.  She has no family history of Breast/GYN/GI cancer.  MEDICAL HISTORY:  Past Medical History:  Diagnosis Date  . Alcohol abuse, in remission   . Anxiety   . Arthritis   . Bipolar disorder ([place])   . Colitis   . COPD  (chronic obstructive pulmonary disease) ([place])    occasional home O2  . Gastric ulcer   . Hemorrhoids   . IBS (irritable bowel syndrome)   . Seizures ([place])    last seizure was about a year ago  . Stroke [facility])    right sided weakness-slight    SURGICAL HISTORY: Past Surgical History:  Procedure Laterality Date  . ABDOMINAL HYSTERECTOMY    . CHOLECYSTECTOMY    . COMPRESSION HIP SCREW Right 04/25/2019   Procedure: COMPRESSION HIP;  Surgeon: Carole Civil, MD;  Location: [facility];  Service: Orthopedics;  Laterality: Right;  1030am  . EYE SURGERY Bilateral    cataract removal  . MASTECTOMY, PARTIAL Right 07/20/2020   Procedure: MASTECTOMY PARTIAL;  Surgeon: Aviva Signs, MD;  Location: [facility];  Service: General;  Laterality: Right;  . PARTIAL MASTECTOMY WITH AXILLARY SENTINEL LYMPH NODE BIOPSY Right 07/06/2020   Procedure: RIGHT PARTIAL MASTECTOMY WITH AXILLARY SENTINEL LYMPH NODE BIOPSY;  Surgeon: Aviva Signs, MD;  Location: [facility];  Service: General;  Laterality: Right;    SOCIAL HISTORY: Social History   Socioeconomic History  . Marital status: Married    Spouse name: Not on file  . Number of children: Not on file  . Years of education: Not on file  . Highest education level: Not on file  Occupational History  . Occupation: retired  Tobacco Use  . Smoking status: Current Every Day Smoker    Packs/day: 0.50    Years:  32.00    Pack years: 16.00    Types: Cigarettes  . Smokeless tobacco: Never Used  Vaping Use  . Vaping Use: Never used  Substance and Sexual Activity  . Alcohol use: No    Comment: h/o heavy use, quit in 1995  . Drug use: Yes    Types: Marijuana    Comment: sometimes  . Sexual activity: Not Currently  Other Topics Concern  . Not on file  Social History Narrative  . Not on file   Social Determinants of Health   Financial Resource Strain: Low Risk   . Difficulty of Paying Living Expenses: Not hard at all  Food Insecurity: No Food Insecurity  .  Worried About Charity fundraiser in the Last Year: Never true  . Ran Out of Food in the Last Year: Never true  Transportation Needs: No Transportation Needs  . Lack of Transportation (Medical): No  . Lack of Transportation (Non-Medical): No  Physical Activity: Insufficiently Active  . Days of Exercise per Week: 7 days  . Minutes of Exercise per Session: 10 min  Stress: Stress Concern Present  . Feeling of Stress : Rather much  Social Connections: Moderately Isolated  . Frequency of Communication with Friends and Family: Three times a week  . Frequency of Social Gatherings with Friends and Family: Never  . Attends Religious Services: Never  . Active Member of Clubs or Organizations: No  . Attends Archivist Meetings: Never  . Marital Status: Married  Human resources officer Violence: Not At Risk  . Fear of Current or Ex-Partner: No  . Emotionally Abused: No  . Physically Abused: No  . Sexually Abused: No    FAMILY HISTORY: Family History  Problem Relation Age of Onset  . Dementia Father     ALLERGIES:  is allergic to chantix [varenicline], codeine, flu virus vaccine, and lithium.  MEDICATIONS:  Current Outpatient Medications  Medication Sig Dispense Refill  . albuterol (PROVENTIL HFA;VENTOLIN HFA) 108 (90 BASE) MCG/ACT inhaler Inhale 2 puffs into the lungs every 6 (six) hours as needed for wheezing or shortness of breath.     Marland Kitchen albuterol (PROVENTIL) (2.5 MG/3ML) 0.083% nebulizer solution Take 2.5 mg by nebulization in the morning, at noon, and at bedtime.     Marland Kitchen alendronate (FOSAMAX) 70 MG tablet Take 70 mg by mouth every Friday.     Marland Kitchen atorvastatin (LIPITOR) 20 MG tablet Take 20 mg by mouth every evening.     . calcium carbonate (OSCAL) 1500 (600 Ca) MG TABS tablet Take 600 mg of elemental calcium by mouth daily with breakfast.    . carboxymethylcellulose (LUBRICANT EYE DROPS) 0.5 % SOLN Place 1 drop into both eyes 3 (three) times daily as needed (dry/irritated eyes.).    Marland Kitchen  Cholecalciferol (VITAMIN D) 50 MCG (2000 UT) tablet Take 2,000 Units by mouth daily.     . clonazePAM (KLONOPIN) 1 MG tablet Take 1 tablet (1 mg total) by mouth 3 (three) times daily.    Marland Kitchen dicyclomine (BENTYL) 10 MG capsule Take 10 mg by mouth daily.     Marland Kitchen omeprazole (PRILOSEC) 40 MG capsule Take 40 mg by mouth daily.     . ondansetron (ZOFRAN) 4 MG tablet Take 1 tablet (4 mg total) by mouth every 6 (six) hours as needed for nausea or vomiting. 20 tablet 0  . oxyCODONE-acetaminophen (PERCOCET) 10-325 MG tablet Take 1 tablet by mouth every 6 (six) hours as needed for pain. 25 tablet 0  . phenytoin (DILANTIN)  100 MG ER capsule Take 400 mg by mouth at bedtime.     Marland Kitchen tiZANidine (ZANAFLEX) 4 MG tablet Take 4 mg by mouth every 8 (eight) hours as needed for muscle spasms.     . traZODone (DESYREL) 100 MG tablet Take 100 mg by mouth at bedtime.     . tamoxifen (NOLVADEX) 20 MG tablet Take 1 tablet (20 mg total) by mouth daily. 90 tablet 3   No current facility-administered medications for this visit.    REVIEW OF SYSTEMS:   Review of Systems  Constitutional: Positive for appetite change (65%) and fatigue (50%).  HENT:   Positive for trouble swallowing.   Respiratory: Positive for shortness of breath (w/ exertion).   Cardiovascular: Positive for chest pain.  Gastrointestinal: Positive for diarrhea and nausea.  Musculoskeletal: Positive for arthralgias (6/10 arthritic joint pains).  Neurological: Positive for dizziness, headaches and numbness (hands).  All other systems reviewed and are negative.   PHYSICAL EXAMINATION: ECOG PERFORMANCE STATUS: 1 - Symptomatic but completely ambulatory  Vitals:   08/11/20 1340  BP: 109/64  Pulse: 82  Resp: 18  SpO2: 92%   Filed Weights   08/11/20 1340  Weight: 135 lb 14.4 oz (61.6 kg)   Physical Exam Vitals reviewed.  Constitutional:      Appearance: Normal appearance.  Cardiovascular:     Rate and Rhythm: Normal rate and regular rhythm.      Pulses: Normal pulses.     Heart sounds: Normal heart sounds.  Pulmonary:     Effort: Pulmonary effort is normal.     Breath sounds: Normal breath sounds.  Chest:     Breasts:        Right: Normal. Skin change: upper inner quadrant lumpectomy healing.        Left: Normal.  Neurological:     General: No focal deficit present.     Mental Status: She is alert and oriented to person, place, and time.  Psychiatric:        Mood and Affect: Mood normal.        Behavior: Behavior normal.      LABORATORY DATA:  I have reviewed the data as listed Recent Results (from the past 2160 hour(s))  Surgical pathology     Status: None   Collection Time: 06/07/20  9:00 AM  Result Value Ref Range   SURGICAL PATHOLOGY      Surgical Pathology * THIS IS AN ADDENDUM REPORT * CASE: [id number] PATIENT: Delores Pethtel Surgical Pathology Report *Addendum *  Reason for Addendum #1:  Breast Biomarker Results  Clinical History: 1 cm upper inner right breast mass   FINAL MICROSCOPIC DIAGNOSIS:  A. BREAST, 1:00, RIGHT, BIOPSY: -  Invasive ductal carcinoma -  See comment  COMMENT:  Based on the biopsy, the carcinoma appears Nottingham grade 1 of 3 and measures 0.9 cm in greatest linear extent. Prognostic markers (ER/PR/ki-67/HER2) are pending and will be reported in an addendum.  Dr. Vic Ripper reviewed the case and agrees with the above diagnosis.  These results were called to Electa Sniff on June 09, 2020.   GROSS DESCRIPTION:  In formalin labeled Corbin Ade and right breast (TIF 0915 hours and CIT less than 1 minute) are 3 cores of gray-white to yellow, soft to firm tissue which range from 1.1 x 0.2 x 0.2 cm to 1.4 x 0.2 x 0.2 cm, submitted 1 block.  SW  06/07/2020    Final Diagnosis performed by Thressa Sheller, MD.   Electronically signed 06/09/2020  Technical component performed at [facility], [place] [address], [place], [place] [address].  Professional  component performed at [employer]. [facility], [place] [address], [place], [place] [address].  Immunohistochemistry Technical component (if applicable) was performed at [facility]. [address] [place], [place], [place] [address].   IMMUNOHISTOCHEMISTRY DISCLAIMER (if applicable): Some of these immunohistochemical stains may have been developed and the performance characteristics determine by [facility]. Some may not have been cleared or approved by the U.S. Food and Drug Administration. The FDA has determined that such clearance or approval is not necessary. This test is used for clinical purposes. It should not be regarded as investigational or for research. This laboratory is certified under the [place] ratory Improvement Amendments of 1988 (CLIA-88) as qualified to perform high complexity clinical laboratory testing.  The controls stained appropriately.  ADDENDUM:  PROGNOSTIC INDICATOR RESULTS:  Immunohistochemical and morphometric analysis performed manually  The tumor cells are NEGATIVE for Her2 (1+).  Estrogen Receptor: 95% POSITIVE, STRONG STAINING INTENSITY Progesterone Receptor: NEGATIVE Proliferation Marker Ki-67: 1%  Comment: The negative hormone receptor study(ies) in the case has an internal positive control.  Reference Range Estrogen and Progesterone Receptor      Negative  0%      Positive  >1%  All controls stained appropriately.          Addendum #1 performed by Thressa Sheller, MD.   Electronically signed 06/09/2020 Technical component performed at [facility], [place] [address], [place], [place] [address].  Professional component performed at [employer]. [facility], [place] [address], [place], [place] [address].   Immunohistochemistry Technical component (if applicable) was performed at [facility]. [address], [place], [place], [place] [address].    IMMUNOHISTOCHEMISTRY DISCLAIMER (if applicable): Some of these immunohistochemical stains may have been developed and the performance characteristics determine by [facility]. Some may not have been cleared or approved by the U.S. Food and Drug Administration. The FDA has determined that such clearance or approval is not necessary. This test is used for clinical purposes. It should not be regarded as investigational or for research. This laboratory is certified under the [place] (CLIA-88) as qualified to perform high complexity clinical laboratory testing.  The controls stained appropriately.   CBC WITH DIFFERENTIAL     Status: None   Collection Time: 07/01/20  2:55 PM  Result Value Ref Range   WBC 8.6 4.0 - 10.5 K/uL   RBC 4.07 3.87 - 5.11 MIL/uL   Hemoglobin 13.0 12.0 - 15.0 g/dL   HCT 38.2 36 - 46 %   MCV 93.9 80.0 - 100.0 fL   MCH 31.9 26.0 - 34.0 pg   MCHC 34.0 30.0 - 36.0 g/dL   RDW 13.3 11.5 - 15.5 %   Platelets 270 150 - 400 K/uL   nRBC 0.0 0.0 - 0.2 %   Neutrophils Relative % 62 %   Neutro Abs 5.3 1.7 - 7.7 K/uL   Lymphocytes Relative 26 %   Lymphs Abs 2.2 0.7 - 4.0 K/uL   Monocytes Relative 10 %   Monocytes Absolute 0.9 0.1 - 1.0 K/uL   Eosinophils Relative 1 %   Eosinophils Absolute 0.1 0.0 - 0.5 K/uL   Basophils Relative 1 %   Basophils Absolute 0.1 0.0 - 0.1 K/uL   Immature Granulocytes 0 %   Abs Immature Granulocytes 0.03 0.00 - 0.07 K/uL  Comment: Performed at [facility], [address], [place], [place] [address]  Comprehensive metabolic panel     Status: Abnormal   Collection Time: 07/01/20  2:55 PM  Result Value Ref Range   Sodium 134 (L) 135 - 145 mmol/L   Potassium 3.6 3.5 - 5.1 mmol/L   Chloride 102 98 - 111 mmol/L   CO2 20 (L) 22 - 32 mmol/L   Glucose, Bld 119 (H) 70 - 99 mg/dL    Comment: Glucose reference range applies only to samples taken after fasting for at least 8 hours.   BUN 8 8 -  23 mg/dL   Creatinine, Ser 0.87 0.44 - 1.00 mg/dL   Calcium 8.9 8.9 - 10.3 mg/dL   Total Protein 7.3 6.5 - 8.1 g/dL   Albumin 4.1 3.5 - 5.0 g/dL   AST 16 15 - 41 U/L   ALT 15 0 - 44 U/L   Alkaline Phosphatase 112 38 - 126 U/L   Total Bilirubin 0.4 0.3 - 1.2 mg/dL   GFR calc non Af Amer >60 >60 mL/min   GFR calc Af Amer >60 >60 mL/min   Anion gap 12 5 - 15    Comment: Performed at [facility], [address] [place], [place] [address]  Surgical pathology     Status: None   Collection Time: 07/06/20 10:19 AM  Result Value Ref Range   SURGICAL PATHOLOGY      SURGICAL PATHOLOGY CASE: [id number] PATIENT: Corbin Ade Surgical Pathology Report     Clinical History: right breast cancer     FINAL MICROSCOPIC DIAGNOSIS:  A. BREAST, RIGHT, PARTIAL MASTECTOMY: - Invasive ductal carcinoma, 1.2 cm, grade 1 - Superior margin shows focal involvement by carcinoma in multiple areas; carcinoma is 0.2 cm from the posterior margin - Negative for lymphovascular perineural invasion - Biopsy site changes - See oncology table  B. LYMPH NODE, SENTINEL, RIGHT, BIOPSY: - Lymph node, negative for carcinoma (0/1)  C. LYMPH NODE, SENTINEL, RIGHT, BIOPSY: - Lymph node, negative for carcinoma (0/1)  D. LYMPH NODE, SENTINEL, RIGHT, BIOPSY:  - Lymph node, negative for carcinoma (0/1)     ONCOLOGY TABLE:  INVASIVE CARCINOMA OF THE BREAST:  Resection  Procedure: Partial mastectomy Specimen Laterality: Right Tumor Size: 1.2 cm Histologic Type: Invasive ductal carcinoma Histologic Grade:      Glandular (Acinar)/Tubular Differ entiation: 1      Nuclear Pleomorphism: 2      Mitotic Rate: 1      Overall Grade: 1 Ductal Carcinoma in Situ: Not identified Margins: Superior margin shows focal involvement by carcinoma in multiple areas; carcinoma is 0.2 cm from the posterior margin Regional Lymph Nodes:      Number of Lymph Nodes Examined: 3      Number of Sentinel Nodes Examined (if  applicable): 3      Number of Lymph Nodes with Macrometastases (>2 mm): 0      Number of Lymph Nodes with Micrometastases: 0      Number of Lymph Nodes with Isolated Tumor Cells (=0.2 mm or =200 cells): 0      Size of Largest Metastatic Deposit (millimeters): Not applicable      Extranodal Extension: Not applicable Treatment Effect:  No known presurgical therapy Breast Biomarker Testing Performed on Previous Biopsy:       Testing Performed on Case Number: [id number]            Estrogen Receptor: 95%, strong staining intensity  Progesterone Receptor: 0%, negative            HER2: Negative (1+)             Ki-67: 1% Representative Tumor Block: A3 Pathologic Stage Classification (pTNM, AJCC 8th Edition): pT1c, pN0  (v4.5.0.0)      GROSS DESCRIPTION:  A: Specimen type: Received in formalin with cut surfaces exposed at 3:20 PM on 07/06/2020, and labeled right breast tissue Size: 6.5 cm medial to lateral by 4.1 cm anterior to posterior by 2.0 cm superior to inferior Orientation: The specimen is received with sutures designating the superior and lateral aspects.  The specimen is inked as follows: Anterior- green, inferior- blue, lateral- orange, medial- yellow, posterior- black, superior- red. Localized area: A tenderness present on the superior aspect of the specimen.  A wire is present, and inserts from the anterior surface. Cut surface: Yellow lobulated adipose tissue with a small amount of tan-white fibrous tissue (approximately 5% fibrous).  Identified at the localized area of interest is a 1.2 x 1.0 x 0.8 cm tan-white, firm, ill-defined lesion.  Within the lesi on a silver metallic ribbon-shaped biopsy clip is identified. Margins: Margins are inked as previously stated.  The lesion abuts the superior margin, measures 0.5 cm to the inferior margin, 0.1 cm to the posterior margin, and 1.0 cm to the anterior margin. Prognostic indicators: Obtained from paraffin  blocks if needed Block summary: 6 blocks submitted 1 = medial margin, perpendicular 2-5 = localized area of interest 6 = lateral margin, perpendicular  B: The specimen is received in formalin and consists of a 2.4 x 1.5 x 0.8 cm tan-gray possible lymph node.  The specimen is bisected and entirely submitted in 2 cassettes.  C: Received in formalin in the same container as part B is a 0.7 x 0.6 x 0.4 cm tan-white possible lymph node.  The specimen is bisected and entirely submitted in 1 cassette.  D: Received in formalin in the same container as part B is a 0.8 x 0.6 x 0.4 cm tan possible lymph node.  The specimen is bisected and entirely submitted in 1 cassette. Craig Staggers 9 /07/2020)     Final Diagnosis performed by Jaquita Folds, MD.   Electronically signed 07/08/2020 Technical component performed at [facility], [place] [address], [place], [place] [address].  Professional component performed at [facility], [place] [address], [place], [place] [address].  Immunohistochemistry Technical component (if applicable) was performed at [facility]. [address], [place], [place], [place] [address].   IMMUNOHISTOCHEMISTRY DISCLAIMER (if applicable): Some of these immunohistochemical stains may have been developed and the performance characteristics determine by [facility]. Some may not have been cleared or approved by the U.S. Food and Drug Administration. The FDA has determined that such clearance or approval is not necessary. This test is used for clinical purposes. It should not be regarded as investigational or for research. This laboratory is certified under the [employer] (CLIA-88) as qualified to perform high complexity clinical laboratory testing.  The controls stained appropriately.     RADIOGRAPHIC STUDIES: I have personally reviewed the radiological reports and agreed  with the findings in the report.  ASSESSMENT:  1.  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  No lymphadenopathy. -Right breast 1:00 biopsy on 06/07/2020 consistent  with invasive ductal carcinoma, ER 95% positive, PR/HER-2 negative, Ki-67 1%. -Right lumpectomy and sentinel lymph node biopsy on 07/06/2020-pathology 1.2 cm invasive ductal carcinoma, grade 1, superior margin focally positive for carcinoma in multiple areas, 0.2 cm from posterior margin, 0/3 lymph nodes involved, no DCIS, negative LVI/perineural invasion. -Reexcision of new superior margin on 07/21/2019 were negative for carcinoma. -Recent CT CAP angio on 05/08/2020 did not show any features suggestive of metastatic disease.  2.  Social/family history: -Lives at home with her husband and son.  She used to work in Charity fundraiser prior to retirement. -Current active smoker, half pack per day for 40 years. -No family history of malignancies.  3.  Osteoporosis: -DEXA scan on 05/31/2020 with T score -3.4. -Fosamax initiated by Dr. Gerarda Fraction in end of August 2021.   PLAN:  1.  Stage I right breast cancer, ER positive: -We have reviewed pathology report in detail. -She has grade 1 with low Ki-67 of 1%.  She does not merit chemotherapy. -We discussed 5 years of antiestrogen therapy. -Because of her osteoporosis, I have recommended tamoxifen instead of aromatase inhibitor. -We discussed side effects of tamoxifen.  We have given a prescription. -She will be referred to radiation oncology. -Follow-up in 4 months with repeat labs.  2.  Osteoporosis: -Continue Fosamax 70 mg daily. -She was also told to take calcium and vitamin D.  Will check vitamin D levels at next visit.  All questions were answered. The patient knows to call the clinic with any problems, questions or  concerns.   Derek Jack, MD 08/11/20 2:54 PM  [place] [phone]   I, Milinda Antis, am acting as a scribe for Dr. Sanda Linger.  I, Derek Jack MD, have reviewed the above documentation for accuracy and completeness, and I agree with the above.

## 2020-08-11 NOTE — Patient Instructions (Signed)
[place] at [facility] Discharge Instructions  You were seen today by Dr. Delton Coombes. Dr. Delton Coombes is a medical oncologist, meaning he specializes in the medical management of cancer.. Dr. Delton Coombes discussed your recent medical history that led to you being here today.  You have been diagnosed with Stage I Breast Cancer. You do not need chemotherapy, but you do need to speak with a radiation oncologist about radiation treatment. Radiation will prevent the cancer from recurring. You will also need an anti-estrogen pill, called Tamoxifen, you will be on that for the next 5 years. This is because your cancer feeds off of estrogen. This will prevent the cancer from recurring. Tamoxifen can causes hot flashes, similar to those during menopause.  Dr. Delton Coombes will refer you to a Radiation Oncologist in [place]. Dr. Delton Coombes will see you back in 4 months.   Thank you for choosing [place] at [facility] to provide your oncology and hematology care.  To afford each patient quality time with our provider, please arrive at least 15 minutes before your scheduled appointment time.   If you have a lab appointment with the [facility] please come in thru the Main Entrance and check in at the main information desk.  You need to re-schedule your appointment should you arrive 10 or more minutes late.  We strive to give you quality time with our providers, and arriving late affects you and other patients whose appointments are after yours.  Also, if you no show three or more times for appointments you may be dismissed from the clinic at the providers discretion.     Again, thank you for choosing [facility].  Our hope is that these requests will decrease the amount of time that you wait before being seen by our physicians.       _____________________________________________________________  Should you have questions after your visit to [facility], please contact our office at [phone] and follow the prompts.  Our office hours are 8:00 a.m. and 4:30 p.m. Monday - Friday.  Please note that voicemails left after 4:00 p.m. may not be returned until the following business day.  We are closed weekends and major holidays.  You do have access to a nurse 24-7, just call the main number to the clinic [phone] and do not press any options, hold on the line and a nurse will answer the phone.    For prescription refill requests, have your pharmacy contact our office and allow 72 hours.    Due to Covid, you will need to wear a mask upon entering the hospital. If you do not have a mask, a mask will be given to you at the Main Entrance upon arrival. For doctor visits, patients may have 1 support person age 21 or older with them. For treatment visits, patients can not have anyone with them due to social distancing guidelines and our immunocompromised population.

## 2020-08-12 ENCOUNTER — Encounter (HOSPITAL_COMMUNITY): Payer: Self-pay | Admitting: Lab

## 2020-08-12 NOTE — Progress Notes (Unsigned)
Referral to [facility]. Records faxec on 10/15

## 2020-08-23 DIAGNOSIS — J449 Chronic obstructive pulmonary disease, unspecified: Secondary | ICD-10-CM | POA: Diagnosis not present

## 2020-08-30 ENCOUNTER — Telehealth (HOSPITAL_COMMUNITY): Payer: Self-pay | Admitting: *Deleted

## 2020-09-09 DIAGNOSIS — J449 Chronic obstructive pulmonary disease, unspecified: Secondary | ICD-10-CM | POA: Diagnosis not present

## 2020-09-29 DIAGNOSIS — Z79899 Other long term (current) drug therapy: Secondary | ICD-10-CM | POA: Diagnosis not present

## 2020-09-29 DIAGNOSIS — Z7983 Long term (current) use of bisphosphonates: Secondary | ICD-10-CM | POA: Diagnosis not present

## 2020-09-29 DIAGNOSIS — F17210 Nicotine dependence, cigarettes, uncomplicated: Secondary | ICD-10-CM | POA: Diagnosis not present

## 2020-09-29 DIAGNOSIS — C50211 Malignant neoplasm of upper-inner quadrant of right female breast: Secondary | ICD-10-CM | POA: Diagnosis not present

## 2020-09-29 DIAGNOSIS — R569 Unspecified convulsions: Secondary | ICD-10-CM | POA: Diagnosis not present

## 2020-09-29 DIAGNOSIS — K219 Gastro-esophageal reflux disease without esophagitis: Secondary | ICD-10-CM | POA: Diagnosis not present

## 2020-09-29 DIAGNOSIS — Z170 Estrogen receptor positive status [ER+]: Secondary | ICD-10-CM | POA: Diagnosis not present

## 2020-09-29 DIAGNOSIS — M810 Age-related osteoporosis without current pathological fracture: Secondary | ICD-10-CM | POA: Diagnosis not present

## 2020-10-07 DIAGNOSIS — Z6823 Body mass index (BMI) 23.0-23.9, adult: Secondary | ICD-10-CM | POA: Diagnosis not present

## 2020-10-07 DIAGNOSIS — G250 Essential tremor: Secondary | ICD-10-CM | POA: Diagnosis not present

## 2020-10-07 DIAGNOSIS — G894 Chronic pain syndrome: Secondary | ICD-10-CM | POA: Diagnosis not present

## 2020-10-07 DIAGNOSIS — M1991 Primary osteoarthritis, unspecified site: Secondary | ICD-10-CM | POA: Diagnosis not present

## 2020-10-09 DIAGNOSIS — J449 Chronic obstructive pulmonary disease, unspecified: Secondary | ICD-10-CM | POA: Diagnosis not present

## 2020-10-12 DIAGNOSIS — J449 Chronic obstructive pulmonary disease, unspecified: Secondary | ICD-10-CM | POA: Diagnosis not present

## 2020-11-08 DIAGNOSIS — J449 Chronic obstructive pulmonary disease, unspecified: Secondary | ICD-10-CM | POA: Diagnosis not present

## 2020-11-09 DIAGNOSIS — J449 Chronic obstructive pulmonary disease, unspecified: Secondary | ICD-10-CM | POA: Diagnosis not present

## 2020-12-05 ENCOUNTER — Inpatient Hospital Stay (HOSPITAL_COMMUNITY): Payer: Medicare Other | Attending: Hematology

## 2020-12-05 DIAGNOSIS — Z170 Estrogen receptor positive status [ER+]: Secondary | ICD-10-CM | POA: Insufficient documentation

## 2020-12-05 DIAGNOSIS — F17200 Nicotine dependence, unspecified, uncomplicated: Secondary | ICD-10-CM | POA: Insufficient documentation

## 2020-12-05 DIAGNOSIS — M25511 Pain in right shoulder: Secondary | ICD-10-CM | POA: Insufficient documentation

## 2020-12-05 DIAGNOSIS — M810 Age-related osteoporosis without current pathological fracture: Secondary | ICD-10-CM | POA: Insufficient documentation

## 2020-12-05 DIAGNOSIS — C50211 Malignant neoplasm of upper-inner quadrant of right female breast: Secondary | ICD-10-CM | POA: Insufficient documentation

## 2020-12-09 ENCOUNTER — Emergency Department (HOSPITAL_COMMUNITY)
Admission: EM | Admit: 2020-12-09 | Discharge: 2020-12-09 | Disposition: A | Discharge status: Home / Self Care | Payer: Medicare Other | Attending: Emergency Medicine | Admitting: Emergency Medicine

## 2020-12-09 ENCOUNTER — Encounter (HOSPITAL_COMMUNITY): Payer: Self-pay

## 2020-12-09 ENCOUNTER — Other Ambulatory Visit: Payer: Self-pay

## 2020-12-09 ENCOUNTER — Emergency Department (HOSPITAL_COMMUNITY): Payer: Medicare Other

## 2020-12-09 DIAGNOSIS — J111 Influenza due to unidentified influenza virus with other respiratory manifestations: Secondary | ICD-10-CM | POA: Diagnosis not present

## 2020-12-09 DIAGNOSIS — R110 Nausea: Secondary | ICD-10-CM | POA: Diagnosis not present

## 2020-12-09 DIAGNOSIS — Z8673 Personal history of transient ischemic attack (TIA), and cerebral infarction without residual deficits: Secondary | ICD-10-CM | POA: Insufficient documentation

## 2020-12-09 DIAGNOSIS — F17210 Nicotine dependence, cigarettes, uncomplicated: Secondary | ICD-10-CM | POA: Insufficient documentation

## 2020-12-09 DIAGNOSIS — Z79899 Other long term (current) drug therapy: Secondary | ICD-10-CM | POA: Insufficient documentation

## 2020-12-09 DIAGNOSIS — R5381 Other malaise: Secondary | ICD-10-CM | POA: Diagnosis not present

## 2020-12-09 DIAGNOSIS — J449 Chronic obstructive pulmonary disease, unspecified: Secondary | ICD-10-CM | POA: Insufficient documentation

## 2020-12-09 DIAGNOSIS — Z853 Personal history of malignant neoplasm of breast: Secondary | ICD-10-CM | POA: Diagnosis not present

## 2020-12-09 DIAGNOSIS — Z743 Need for continuous supervision: Secondary | ICD-10-CM | POA: Diagnosis not present

## 2020-12-09 DIAGNOSIS — R059 Cough, unspecified: Secondary | ICD-10-CM | POA: Diagnosis not present

## 2020-12-09 DIAGNOSIS — Z20822 Contact with and (suspected) exposure to covid-19: Secondary | ICD-10-CM | POA: Diagnosis not present

## 2020-12-09 LAB — URINALYSIS, ROUTINE W REFLEX MICROSCOPIC
Bilirubin Urine: NEGATIVE
Glucose, UA: NEGATIVE mg/dL
Ketones, ur: NEGATIVE mg/dL
Leukocytes,Ua: NEGATIVE
Nitrite: NEGATIVE
Protein, ur: 30 mg/dL — AB
Specific Gravity, Urine: 1.018 (ref 1.005–1.030)
pH: 7 (ref 5.0–8.0)

## 2020-12-09 LAB — CBC WITH DIFFERENTIAL/PLATELET
Abs Immature Granulocytes: 0.01 10*3/uL (ref 0.00–0.07)
Basophils Absolute: 0.1 10*3/uL (ref 0.0–0.1)
Basophils Relative: 1 %
Eosinophils Absolute: 0.2 10*3/uL (ref 0.0–0.5)
Eosinophils Relative: 2 %
HCT: 42.9 % (ref 36.0–46.0)
Hemoglobin: 14.7 g/dL (ref 12.0–15.0)
Immature Granulocytes: 0 %
Lymphocytes Relative: 33 %
Lymphs Abs: 2.5 10*3/uL (ref 0.7–4.0)
MCH: 32.4 pg (ref 26.0–34.0)
MCHC: 34.3 g/dL (ref 30.0–36.0)
MCV: 94.5 fL (ref 80.0–100.0)
Monocytes Absolute: 0.9 10*3/uL (ref 0.1–1.0)
Monocytes Relative: 12 %
Neutro Abs: 3.9 10*3/uL (ref 1.7–7.7)
Neutrophils Relative %: 52 %
Platelets: 170 10*3/uL (ref 150–400)
RBC: 4.54 MIL/uL (ref 3.87–5.11)
RDW: 14 % (ref 11.5–15.5)
WBC: 7.6 10*3/uL (ref 4.0–10.5)
nRBC: 0 % (ref 0.0–0.2)

## 2020-12-09 LAB — COMPREHENSIVE METABOLIC PANEL
ALT: 17 U/L (ref 0–44)
AST: 27 U/L (ref 15–41)
Albumin: 4.2 g/dL (ref 3.5–5.0)
Alkaline Phosphatase: 68 U/L (ref 38–126)
Anion gap: 11 (ref 5–15)
BUN: 8 mg/dL (ref 8–23)
CO2: 19 mmol/L — ABNORMAL LOW (ref 22–32)
Calcium: 8.8 mg/dL — ABNORMAL LOW (ref 8.9–10.3)
Chloride: 105 mmol/L (ref 98–111)
Creatinine, Ser: 0.91 mg/dL (ref 0.44–1.00)
GFR, Estimated: >60 mL/min (ref >60)
Glucose, Bld: 126 mg/dL — ABNORMAL HIGH (ref 70–99)
Potassium: 4.4 mmol/L (ref 3.5–5.1)
Sodium: 135 mmol/L (ref 135–145)
Total Bilirubin: 0.8 mg/dL (ref 0.3–1.2)
Total Protein: 8 g/dL (ref 6.5–8.1)

## 2020-12-09 LAB — SARS CORONAVIRUS 2 (TAT 6-24 HRS): SARS Coronavirus 2: NEGATIVE

## 2020-12-09 MED ORDER — LACTATED RINGERS IV BOLUS
1000.0000 mL | Freq: Once | INTRAVENOUS | Status: AC
  Administered 2020-12-09: 1000 mL via INTRAVENOUS

## 2020-12-09 MED ORDER — ONDANSETRON HCL 4 MG/2ML IJ SOLN
4.0000 mg | Freq: Once | INTRAMUSCULAR | Status: AC
  Administered 2020-12-09: 4 mg via INTRAVENOUS
  Filled 2020-12-09: qty 2

## 2020-12-09 MED ORDER — ONDANSETRON HCL 4 MG PO TABS: 4.0000 mg | ORAL_TABLET | Freq: Four times a day (QID) | ORAL | 0 refills | Status: DC | PRN

## 2020-12-09 MED ORDER — KETOROLAC TROMETHAMINE 30 MG/ML IJ SOLN
15.0000 mg | Freq: Once | INTRAMUSCULAR | Status: AC
  Administered 2020-12-09: 15 mg via INTRAVENOUS
  Filled 2020-12-09: qty 1

## 2020-12-09 NOTE — Discharge Instructions (Addendum)
Your blood tests and chest x-ray did not show any serious illness.  You appear to have some sort of a virus.  A specimen was sent to test for COVID-19.  That result will be available sometime this morning.  You can check that result on MyChart.  Drink plenty of fluids.  It is very important to keep yourself well-hydrated.  Continue to use your nebulizer as needed.  Take ibuprofen or acetaminophen as needed for fever or aching.  If your aching is severe, you may take both acetaminophen and ibuprofen together.  The combination gives you better pain relief than either one by itself.  If symptoms are getting worse, return to the emergency department for further evaluation.

## 2020-12-09 NOTE — ED Triage Notes (Signed)
Pt brought in by EMS with reports of nausea that started 2 days ago, two loose stools today, pt reports body aches. Pt denies fever.

## 2020-12-09 NOTE — ED Provider Notes (Signed)
[facility] EMERGENCY DEPARTMENT Provider Note   CSN: [id number] Arrival date & time: 12/09/20  0103   History Chief Complaint  Patient presents with  . Nausea    [name] is a 70 y.o. female.  The history is provided by the patient.  She has history of COPD, stroke, seizures and comes in because of not feeling well for the last 3 days.  She is a very poor historian, but apparently she has had generalized body aches, chills, sweats but she denies fever.  She has had a cough productive of light yellow sputum.  She thinks that her baseline dyspnea is slightly worse than it had been.  She has had nausea but no vomiting.  Stools have been loose, but no overt diarrhea.  She denies any sick contacts.  She has been vaccinated against COVID-19.  She has not tried any treatment at home.  Of note, she normally smokes half a pack of cigarettes a day, but states that she has not been able to smoke while she has been sick.  Past Medical History:  Diagnosis Date  . Alcohol abuse, in remission   . Anxiety   . Arthritis   . Bipolar disorder ([place])   . Colitis   . COPD (chronic obstructive pulmonary disease) ([place])    occasional home O2  . Gastric ulcer   . Hemorrhoids   . IBS (irritable bowel syndrome)   . Seizures ([place])    last seizure was about a year ago  . Stroke [facility])    right sided weakness-slight    Patient Active Problem List   Diagnosis Date Noted  . 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 S/P right hip fracture   . Acute blood loss as cause of postoperative anemia   . Closed fracture of right hip ([place]) s/p compression fixation 04/25/19 04/24/2019  . Leukocytosis 04/24/2019  . COPD (chronic obstructive pulmonary disease) ([place]) 05/30/2017  . Colitis 08/21/2016  . Adrenal mass, left ([place]) 08/21/2016  . Solitary pulmonary nodule 08/21/2016  . Tobacco use disorder 08/21/2016  . GERD (gastroesophageal reflux disease) 06/13/2010  . IRRITABLE  BOWEL SYNDROME 06/13/2010  . HEMATOCHEZIA 06/13/2010  . WEIGHT LOSS, ABNORMAL 06/13/2010  . ABDOMINAL PAIN, GENERALIZED 06/13/2010    Past Surgical History:  Procedure Laterality Date  . ABDOMINAL HYSTERECTOMY    . CHOLECYSTECTOMY    . COMPRESSION HIP SCREW Right 04/25/2019   Procedure: COMPRESSION HIP;  Surgeon: Carole Civil, MD;  Location: [facility] ORS;  Service: Orthopedics;  Laterality: Right;  1030am  . EYE SURGERY Bilateral    cataract removal  . MASTECTOMY, PARTIAL Right 07/20/2020   Procedure: MASTECTOMY PARTIAL;  Surgeon: Aviva Signs, MD;  Location: [facility] ORS;  Service: General;  Laterality: Right;  . PARTIAL MASTECTOMY WITH AXILLARY SENTINEL LYMPH NODE BIOPSY Right 07/06/2020   Procedure: RIGHT PARTIAL MASTECTOMY WITH AXILLARY SENTINEL LYMPH NODE BIOPSY;  Surgeon: Aviva Signs, MD;  Location: [facility] ORS;  Service: General;  Laterality: Right;     OB History    Gravida  4   Para  4   Term  4   Preterm      AB      Living  3     SAB      IAB      Ectopic      Multiple      Live Births              Family History  Problem Relation Age of Onset  . Dementia Father     Social History   Tobacco Use  . Smoking status: Current Every Day Smoker    Packs/day: 0.50    Years: 32.00    Pack years: 16.00    Types: Cigarettes  . Smokeless tobacco: Never Used  Vaping Use  . Vaping Use: Never used  Substance Use Topics  . Alcohol use: No    Comment: h/o heavy use, quit in 1995  . Drug use: Yes    Types: Marijuana    Comment: sometimes    Home Medications Prior to Admission medications   Medication Sig Start Date End Date Taking? Authorizing Provider  albuterol (PROVENTIL HFA;VENTOLIN HFA) 108 (90 BASE) MCG/ACT inhaler Inhale 2 puffs into the lungs every 6 (six) hours as needed for wheezing or shortness of breath.     [provider]  albuterol (PROVENTIL) (2.5 MG/3ML) 0.083% nebulizer solution Take 2.5 mg by nebulization in the morning, at noon,  and at bedtime.     [provider]  alendronate (FOSAMAX) 70 MG tablet Take 70 mg by mouth every Friday.  06/24/20   [provider]  atorvastatin (LIPITOR) 20 MG tablet Take 20 mg by mouth every evening.  01/08/19   [provider]  calcium carbonate (OSCAL) 1500 (600 Ca) MG TABS tablet Take 600 mg of elemental calcium by mouth daily with breakfast.    [provider]  carboxymethylcellulose (LUBRICANT EYE DROPS) 0.5 % SOLN Place 1 drop into both eyes 3 (three) times daily as needed (dry/irritated eyes.).    [provider]  Cholecalciferol (VITAMIN D) 50 MCG (2000 UT) tablet Take 2,000 Units by mouth daily.     [provider]  clonazePAM (KLONOPIN) 1 MG tablet Take 1 tablet (1 mg total) by mouth 3 (three) times daily. 04/26/19   Barton Dubois, MD  dicyclomine (BENTYL) 10 MG capsule Take 10 mg by mouth daily.     [provider]  omeprazole (PRILOSEC) 40 MG capsule Take 40 mg by mouth daily.  10/04/16   [provider]  ondansetron (ZOFRAN) 4 MG tablet Take 1 tablet (4 mg total) by mouth every 6 (six) hours as needed for nausea or vomiting. 07/26/20   Aviva Signs, MD  oxyCODONE-acetaminophen (PERCOCET) 10-325 MG tablet Take 1 tablet by mouth every 6 (six) hours as needed for pain. 07/26/20 07/26/21  Aviva Signs, MD  phenytoin (DILANTIN) 100 MG ER capsule Take 400 mg by mouth at bedtime.     [provider]  tamoxifen (NOLVADEX) 20 MG tablet Take 1 tablet (20 mg total) by mouth daily. 08/11/20   Derek Jack, MD  tiZANidine (ZANAFLEX) 4 MG tablet Take 4 mg by mouth every 8 (eight) hours as needed for muscle spasms.  08/22/18   [provider]  traZODone (DESYREL) 100 MG tablet Take 100 mg by mouth at bedtime.  04/09/19   [provider]    Allergies    Chantix [varenicline], Codeine, Influenza virus vaccine, and Lithium  Review of Systems   Review of Systems  All other systems reviewed and  are negative.   Physical Exam Updated Vital Signs BP (!) 166/88 (BP Location: Left Arm)   Pulse 89   Temp 98.1 F (36.7 C) (Oral)   Resp 17   Ht 5\' 3"  (1.6 m)   Wt 61.6 kg   SpO2 100%   BMI 24.06 kg/m   Physical Exam Vitals and nursing note reviewed.   [place]  year old female, resting comfortably and in no acute distress. Vital signs are significant for elevated blood pressure. Oxygen saturation is 100%, which is normal. Head is normocephalic and atraumatic. PERRLA, EOMI. Oropharynx is clear. Neck is nontender and supple without adenopathy or JVD. Back is nontender and there is no CVA tenderness. Lungs are clear without rales, wheezes, or rhonchi. Chest is nontender. Heart has regular rate and rhythm without murmur. Abdomen is soft, flat, nontender without masses or hepatosplenomegaly and peristalsis is slightly hypoactive. Extremities have no cyanosis or edema, full range of motion is present. Skin is warm and dry without rash. Neurologic: Mental status is normal, cranial nerves are intact, there are no motor or sensory deficits.  ED Results / Procedures / Treatments   Labs (all labs ordered are listed, but only abnormal results are displayed) Labs Reviewed  COMPREHENSIVE METABOLIC PANEL - Abnormal; Notable for the following components:      Result Value   CO2 19 (*)    Glucose, Bld 126 (*)    Calcium 8.8 (*)    All other components within normal limits  URINALYSIS, ROUTINE W REFLEX MICROSCOPIC - Abnormal; Notable for the following components:   Color, Urine AMBER (*)    APPearance HAZY (*)    Hgb urine dipstick SMALL (*)    Protein, ur 30 (*)    Bacteria, UA RARE (*)    All other components within normal limits  SARS CORONAVIRUS 2 (TAT 6-24 HRS)  CBC WITH DIFFERENTIAL/PLATELET    EKG EKG Interpretation  Date/Time:  Friday December 09 2020 01:15:33 EST Ventricular Rate:  81 PR Interval:    QRS Duration: 98 QT Interval:  369 QTC Calculation: 429 R  Axis:   86 Text Interpretation: Sinus rhythm Borderline right axis deviation When compared with ECG of 05/07/2020, No significant change was found Confirmed by Delora Fuel ([id number]) on 12/09/2020 1:23:01 AM   Radiology DG Chest Port 1 View  Result Date: 12/09/2020 CLINICAL DATA:  Cough EXAM: PORTABLE CHEST 1 VIEW COMPARISON:  05/07/2020 FINDINGS: There is hyperinflation of the lungs compatible with COPD. Heart and mediastinal contours are within normal limits. No focal opacities or effusions. No acute bony abnormality. Aortic atherosclerosis. IMPRESSION: COPD.  No active disease. Electronically Signed   By: Rolm Baptise M.D.   On: 12/09/2020 02:15    Procedures Procedures   Medications Ordered in ED Medications - No data to display  ED Course  I have reviewed the triage vital signs and the nursing notes.  Pertinent labs & imaging results that were available during my care of the patient were reviewed by me and considered in my medical decision making (see chart for details).  MDM Rules/Calculators/A&P Symptom complex suggestive of a viral illness.  Will check chest x-ray to rule out pneumonia.  She will be given IV fluids and ondansetron and ketorolac.  In the setting of COVID-19 pandemic, will screen for COVID-19.  Old records are reviewed, and she does have prior ED visits for vomiting and diarrhea.  ECG is unchanged from prior.  Chest x-ray shows COPD with no evidence of pneumonia.  Labs are reassuring.  Normal WBC and differential, normal renal function and normal hepatic function tests.  She feels better following above-noted treatment.  She is discharged with prescription for ondansetron and told to use over-the-counter analgesics as needed for pain.  Return precautions discussed.  EARLY ORD was evaluated in Emergency Department on 12/09/2020 for the symptoms described in the history of present illness. She  was evaluated in the context of the global COVID-19 pandemic, which  necessitated consideration that the patient might be at risk for infection with the SARS-CoV-2 virus that causes COVID-19. Institutional protocols and algorithms that pertain to the evaluation of patients at risk for COVID-19 are in a state of rapid change based on information released by regulatory bodies including the CDC and federal and state organizations. These policies and algorithms were followed during the patient's care in the ED.  Final Clinical Impression(s) / ED Diagnoses Final diagnoses:  Influenza-like illness    Rx / DC Orders ED Discharge Orders         Ordered    ondansetron (ZOFRAN) 4 MG tablet  Every 6 hours PRN        12/09/20 7209           Delora Fuel, MD 19/80/22 [id number]

## 2020-12-10 DIAGNOSIS — J449 Chronic obstructive pulmonary disease, unspecified: Secondary | ICD-10-CM | POA: Diagnosis not present

## 2020-12-12 ENCOUNTER — Inpatient Hospital Stay (HOSPITAL_COMMUNITY): Payer: Medicare Other | Admitting: Hematology

## 2020-12-15 NOTE — Progress Notes (Signed)
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 female here today for follow up of her right breast cancer. Her last visit was on 08/11/2020.   Today she reports feeling well overall.  She complains of right shoulder pain when she lies on the right side during sleep.  She reports that she cannot sleep on her left side or on her back.  She is reporting good tolerance to tamoxifen.  Denies any major hot flashes or musculoskeletal symptoms.   REVIEW OF SYSTEMS:   Review of Systems  Gastrointestinal: Positive for constipation and nausea.  Musculoskeletal:       Right shoulder pain at nighttime  All other systems reviewed and are negative.   I have reviewed the past medical history, past surgical history, social history and family history with the patient and they are unchanged from previous note.   ALLERGIES:   is allergic to chantix [varenicline], codeine, influenza virus vaccine, and lithium.   MEDICATIONS:  Current Outpatient Medications  Medication Sig Dispense Refill  . albuterol (PROVENTIL HFA;VENTOLIN HFA) 108 (90 BASE) MCG/ACT inhaler Inhale 2 puffs into the  lungs every 6 (six) hours as needed for wheezing or shortness of breath.     Marland Kitchen albuterol (PROVENTIL) (2.5 MG/3ML) 0.083% nebulizer solution Take 2.5 mg by nebulization in the morning, at noon, and at bedtime.     Marland Kitchen alendronate (FOSAMAX) 70 MG tablet Take 70 mg by mouth every Friday.     Marland Kitchen atorvastatin (LIPITOR) 20 MG tablet Take 20 mg by mouth every evening.     . calcium carbonate (OSCAL) 1500 (600 Ca) MG TABS tablet Take 600 mg of elemental calcium by mouth daily with breakfast.    . carboxymethylcellulose (LUBRICANT EYE DROPS) 0.5 % SOLN Place 1 drop into both eyes 3 (three) times daily as needed (dry/irritated eyes.).    Marland Kitchen Cholecalciferol (VITAMIN D) 50 MCG (2000 UT) tablet Take 2,000 Units by mouth daily.     . clonazePAM (KLONOPIN) 1 MG tablet Take 1 tablet (1 mg total) by mouth 3 (three) times daily.    Marland Kitchen dicyclomine (BENTYL) 10 MG capsule Take 10 mg by mouth daily.     Marland Kitchen omeprazole (PRILOSEC) 40 MG capsule Take 40 mg by mouth daily.     . ondansetron (ZOFRAN) 4 MG tablet Take 1 tablet (4 mg total) by mouth every 6 (six) hours as needed for nausea or vomiting. 20 tablet 0  . phenytoin (DILANTIN) 100 MG ER capsule Take 400 mg by mouth at bedtime.     . tamoxifen (NOLVADEX) 20 MG tablet Take 1 tablet (20 mg total) by mouth daily. 90 tablet 3  . tiZANidine (ZANAFLEX) 4 MG tablet Take 4 mg by  mouth every 8 (eight) hours as needed for muscle spasms.     . traZODone (DESYREL) 100 MG tablet Take 100 mg by mouth at bedtime.      No current facility-administered medications for this visit.     PHYSICAL EXAMINATION: Performance status (ECOG): 1 - Symptomatic but completely ambulatory  There were no vitals filed for this visit. Wt Readings from Last 3 Encounters:  12/09/20 135 lb 12.9 oz (61.6 kg)  08/11/20 135 lb 14.4 oz (61.6 kg)  07/26/20 135 lb (61.2 kg)   Physical Exam Vitals reviewed.  Constitutional:      Appearance: Normal appearance.  Cardiovascular:     Rate and Rhythm: Normal  rate and regular rhythm.     Pulses: Normal pulses.     Heart sounds: Normal heart sounds.  Pulmonary:     Breath sounds: Normal breath sounds.  Abdominal:     General: There is no distension.     Palpations: Abdomen is soft. There is no mass.  Skin:    General: Skin is warm.  Neurological:     General: No focal deficit present.     Mental Status: She is alert and oriented to person, place, and time.  Psychiatric:        Mood and Affect: Mood normal.        Behavior: Behavior normal.   Right breast lumpectomy site is within normal limits.  No palpable right axillary adenopathy.  Left breast has no palpable masses.  Breast Exam Chaperone: Milinda Antis, MD     LABORATORY DATA:  I have reviewed the data as listed CMP Latest Ref Rng & Units 12/09/2020 07/01/2020 05/07/2020  Glucose 70 - 99 mg/dL 126(H) 119(H) 122(H)  BUN 8 - 23 mg/dL _0 Creatinine 0.44 - 1.00 mg/dL 0.91 0.87 0.81  Sodium 135 - 145 mmol/L 135 134(L) 133(L)  Potassium 3.5 - 5.1 mmol/L 4.4 3.6 3.6  Chloride 98 - 111 mmol/L 105 102 99  CO2 22 - 32 mmol/L 19(L) 20(L) 24  Calcium 8.9 - 10.3 mg/dL 8.8(L) 8.9 9.0  Total Protein 6.5 - 8.1 g/dL 8.0 7.3 7.3  Total Bilirubin 0.3 - 1.2 mg/dL 0.8 0.4 0.4  Alkaline Phos 38 - 126 U/L 68 112 121  AST 15 - 41 U/L _1 ALT 0 - 44 U/L _2 No results found for: XKP537 Lab Results  Component Value Date   WBC 7.6 12/09/2020   HGB 14.7 12/09/2020   HCT 42.9 12/09/2020   MCV 94.5 12/09/2020   PLT 170 12/09/2020   NEUTROABS 3.9 12/09/2020    ASSESSMENT:  1.  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  No lymphadenopathy. -Right breast 1:00 biopsy on 06/07/2020 consistent with invasive ductal carcinoma, ER 95% positive, PR/HER-2 negative, Ki-67 1%. -Right lumpectomy and sentinel lymph node biopsy on  07/06/2020-pathology 1.2 cm invasive ductal carcinoma, grade 1, superior margin focally positive for carcinoma in multiple areas, 0.2 cm from posterior margin, 0/3 lymph nodes involved, no DCIS, negative LVI/perineural invasion. -Reexcision of new superior margin on 07/21/2019 were negative for carcinoma. -Recent CT CAP angio on 05/08/2020 did not show any features suggestive of metastatic disease.  2.  Social/family history: -Lives at home with her husband and son.  She used to work in [employer] prior to retirement. -Current active smoker, half  pack per day for 40 years. -No family history of malignancies.  3.  Osteoporosis: -DEXA scan on 05/31/2020 with T score -3.4. -Fosamax initiated by Dr. Gerarda Fraction in end of August 2021.   PLAN:  1.  Stage I right breast cancer, ER positive: -She is tolerating tamoxifen very well. -We reviewed her labs from 12/09/2020 which showed normal chemistries and CBC. -Physical examination today did not reveal any palpable breast masses. -RTC 6 months for follow-up.  Continue tamoxifen for at least 5 years. -I will arrange for bilateral mammogram in August 1 week.  We will also repeat blood work. -She reports pain in the right lumpectomy site as well as right axillary lymph node biopsy site and in the right shoulder.  Likely related to postsurgical pain.  She reports that she cannot sleep on her right side.  I will give her a prescription for gabapentin 300 mg at bedtime to see if it helps.  2.  Osteoporosis: -Continue Fosamax weekly. -We will check vitamin D levels at next visit.  She was told to continue calcium and vitamin D.   Breast Cancer therapy associated bone loss: I have recommended calcium, Vitamin D and weight bearing exercises.   No orders of the defined types were placed in this encounter.  The patient has a good understanding of the overall plan. she agrees with it. she will call with any problems that may develop before the next visit  here.    Derek Jack, MD [place] [phone]   I, Milinda Antis, am acting as a scribe for Dr. Sanda Linger.  I, Derek Jack MD, have reviewed the above documentation for accuracy and completeness, and I agree with the above.

## 2020-12-19 ENCOUNTER — Inpatient Hospital Stay (HOSPITAL_BASED_OUTPATIENT_CLINIC_OR_DEPARTMENT_OTHER): Payer: Medicare Other | Admitting: Hematology

## 2020-12-19 ENCOUNTER — Other Ambulatory Visit (HOSPITAL_COMMUNITY): Payer: Self-pay | Admitting: Hematology

## 2020-12-19 ENCOUNTER — Other Ambulatory Visit: Payer: Self-pay

## 2020-12-19 VITALS — BP 132/63 | HR 88 | Temp 97.1°F | Resp 18 | Wt 128.8 lb

## 2020-12-19 DIAGNOSIS — Z170 Estrogen receptor positive status [ER+]: Secondary | ICD-10-CM

## 2020-12-19 DIAGNOSIS — C50211 Malignant neoplasm of upper-inner quadrant of right female breast: Secondary | ICD-10-CM

## 2020-12-19 DIAGNOSIS — F17200 Nicotine dependence, unspecified, uncomplicated: Secondary | ICD-10-CM | POA: Diagnosis not present

## 2020-12-19 DIAGNOSIS — M810 Age-related osteoporosis without current pathological fracture: Secondary | ICD-10-CM | POA: Diagnosis not present

## 2020-12-19 DIAGNOSIS — M25511 Pain in right shoulder: Secondary | ICD-10-CM | POA: Diagnosis not present

## 2020-12-19 MED ORDER — GABAPENTIN 300 MG PO CAPS: 300.0000 mg | ORAL_CAPSULE | Freq: Every day | ORAL | 0 refills | Status: DC

## 2020-12-19 NOTE — Patient Instructions (Signed)
[facility] at [facility] Discharge Instructions  You were seen and examined today by Dr. Katragadda. Please follow up as scheduled.    Thank you for choosing [facility] at [facility] to provide your oncology and hematology care.  To afford each patient quality time with our provider, please arrive at least 15 minutes before your scheduled appointment time.   If you have a lab appointment with the Cancer Center please come in thru the Main Entrance and check in at the main information desk.  You need to re-schedule your appointment should you arrive 10 or more minutes late.  We strive to give you quality time with our providers, and arriving late affects you and other patients whose appointments are after yours.  Also, if you no show three or more times for appointments you may be dismissed from the clinic at the providers discretion.     Again, thank you for choosing [facility].  Our hope is that these requests will decrease the amount of time that you wait before being seen by our physicians.       _____________________________________________________________  Should you have questions after your visit to [facility], please contact our office at [phone] and follow the prompts.  Our office hours are 8:00 a.m. and 4:30 p.m. Monday - Friday.  Please note that voicemails left after 4:00 p.m. may not be returned until the following business day.  We are closed weekends and major holidays.  You do have access to a nurse 24-7, just call the main number to the clinic [phone] and do not press any options, hold on the line and a nurse will answer the phone.    For prescription refill requests, have your pharmacy contact our office and allow 72 hours.    Due to Covid, you will need to wear a mask upon entering the hospital. If you do not have a mask, a mask will be given to you at the Main Entrance upon arrival. For  doctor visits, patients may have 1 support person age 18 or older with them. For treatment visits, patients can not have anyone with them due to social distancing guidelines and our immunocompromised population.      

## 2020-12-22 ENCOUNTER — Encounter (HOSPITAL_COMMUNITY): Payer: Self-pay

## 2020-12-22 NOTE — Progress Notes (Signed)
Patient called and left a VM that she had some arm pain that has since resolved and she has attributed that to helping her son move residences.

## 2020-12-23 ENCOUNTER — Other Ambulatory Visit (HOSPITAL_COMMUNITY): Payer: Self-pay | Admitting: Internal Medicine

## 2020-12-23 ENCOUNTER — Other Ambulatory Visit: Payer: Self-pay | Admitting: Internal Medicine

## 2020-12-23 ENCOUNTER — Other Ambulatory Visit (HOSPITAL_COMMUNITY): Payer: Self-pay | Admitting: Pediatrics

## 2020-12-23 DIAGNOSIS — J209 Acute bronchitis, unspecified: Secondary | ICD-10-CM | POA: Diagnosis not present

## 2020-12-23 DIAGNOSIS — E7849 Other hyperlipidemia: Secondary | ICD-10-CM | POA: Diagnosis not present

## 2020-12-23 DIAGNOSIS — Z6822 Body mass index (BMI) 22.0-22.9, adult: Secondary | ICD-10-CM | POA: Diagnosis not present

## 2020-12-23 DIAGNOSIS — E039 Hypothyroidism, unspecified: Secondary | ICD-10-CM | POA: Diagnosis not present

## 2020-12-23 DIAGNOSIS — E782 Mixed hyperlipidemia: Secondary | ICD-10-CM | POA: Diagnosis not present

## 2020-12-23 DIAGNOSIS — R0989 Other specified symptoms and signs involving the circulatory and respiratory systems: Secondary | ICD-10-CM

## 2020-12-23 DIAGNOSIS — Z0001 Encounter for general adult medical examination with abnormal findings: Secondary | ICD-10-CM | POA: Diagnosis not present

## 2020-12-23 DIAGNOSIS — Z1389 Encounter for screening for other disorder: Secondary | ICD-10-CM | POA: Diagnosis not present

## 2020-12-23 DIAGNOSIS — J329 Chronic sinusitis, unspecified: Secondary | ICD-10-CM | POA: Diagnosis not present

## 2020-12-23 DIAGNOSIS — J9801 Acute bronchospasm: Secondary | ICD-10-CM | POA: Diagnosis not present

## 2020-12-26 ENCOUNTER — Ambulatory Visit (HOSPITAL_COMMUNITY): Payer: Medicare Other

## 2020-12-26 ENCOUNTER — Other Ambulatory Visit: Payer: Self-pay | Admitting: Internal Medicine

## 2020-12-26 DIAGNOSIS — M546 Pain in thoracic spine: Secondary | ICD-10-CM

## 2021-01-07 DIAGNOSIS — J449 Chronic obstructive pulmonary disease, unspecified: Secondary | ICD-10-CM | POA: Diagnosis not present

## 2021-01-09 DIAGNOSIS — J449 Chronic obstructive pulmonary disease, unspecified: Secondary | ICD-10-CM | POA: Diagnosis not present

## 2021-01-12 ENCOUNTER — Other Ambulatory Visit (HOSPITAL_COMMUNITY): Payer: Self-pay | Admitting: Hematology

## 2021-01-30 ENCOUNTER — Emergency Department (HOSPITAL_COMMUNITY)
Admission: EM | Admit: 2021-01-30 | Discharge: 2021-01-30 | Disposition: A | Discharge status: Home / Self Care | Payer: Medicare Other

## 2021-01-30 ENCOUNTER — Other Ambulatory Visit: Payer: Self-pay

## 2021-02-07 DIAGNOSIS — J449 Chronic obstructive pulmonary disease, unspecified: Secondary | ICD-10-CM | POA: Diagnosis not present

## 2021-02-14 DIAGNOSIS — Z6823 Body mass index (BMI) 23.0-23.9, adult: Secondary | ICD-10-CM | POA: Diagnosis not present

## 2021-02-14 DIAGNOSIS — R110 Nausea: Secondary | ICD-10-CM | POA: Diagnosis not present

## 2021-02-15 IMAGING — MG MM BREAST LOCALIZATION CLIP
6 series · 6 of 18 positions shown · non-contrast
Comparison: Previous exam(s).

CLINICAL DATA: Evaluate RIBBON clip following ultrasound-guided
RIGHT breast biopsy

EXAM:
DIAGNOSTIC RIGHT MAMMOGRAM POST ULTRASOUND BIOPSY

[R MLO synth-2D]
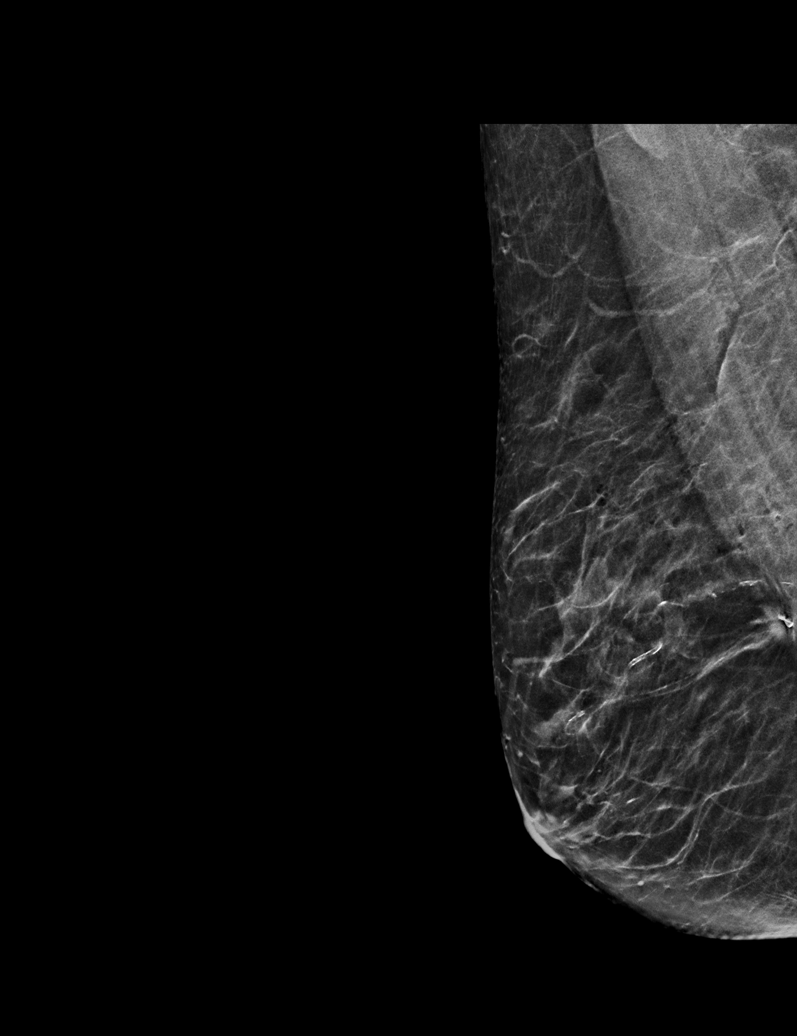

[R CC synth-2D]
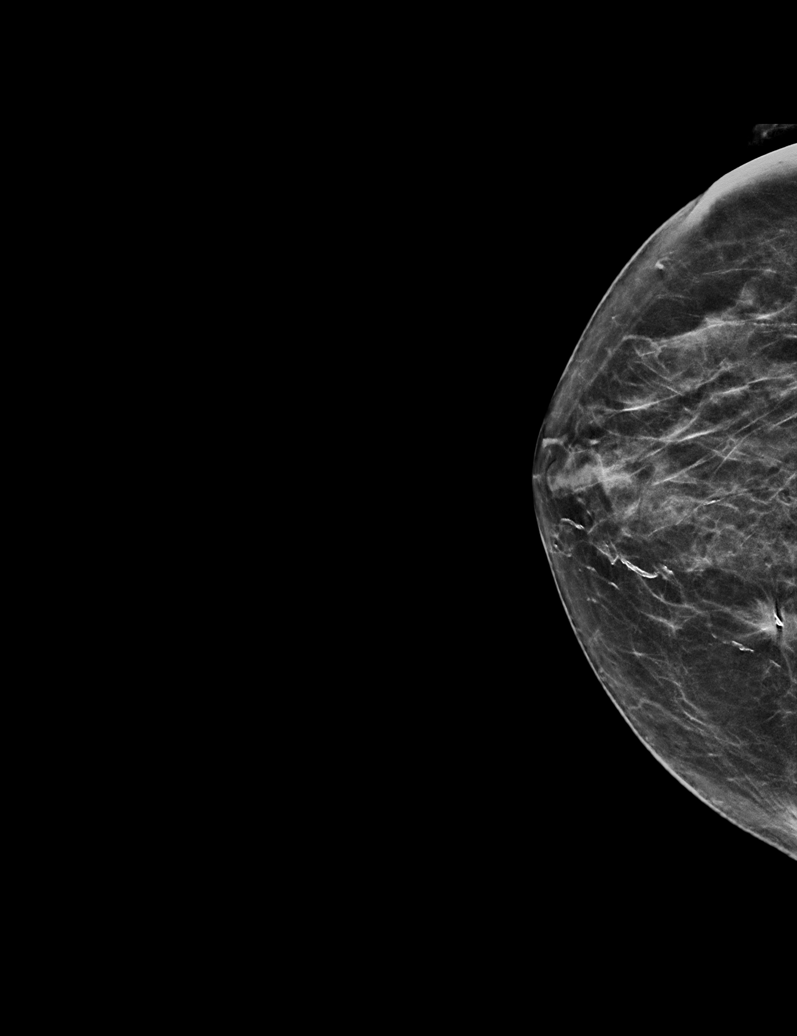

[R ML synth-2D]
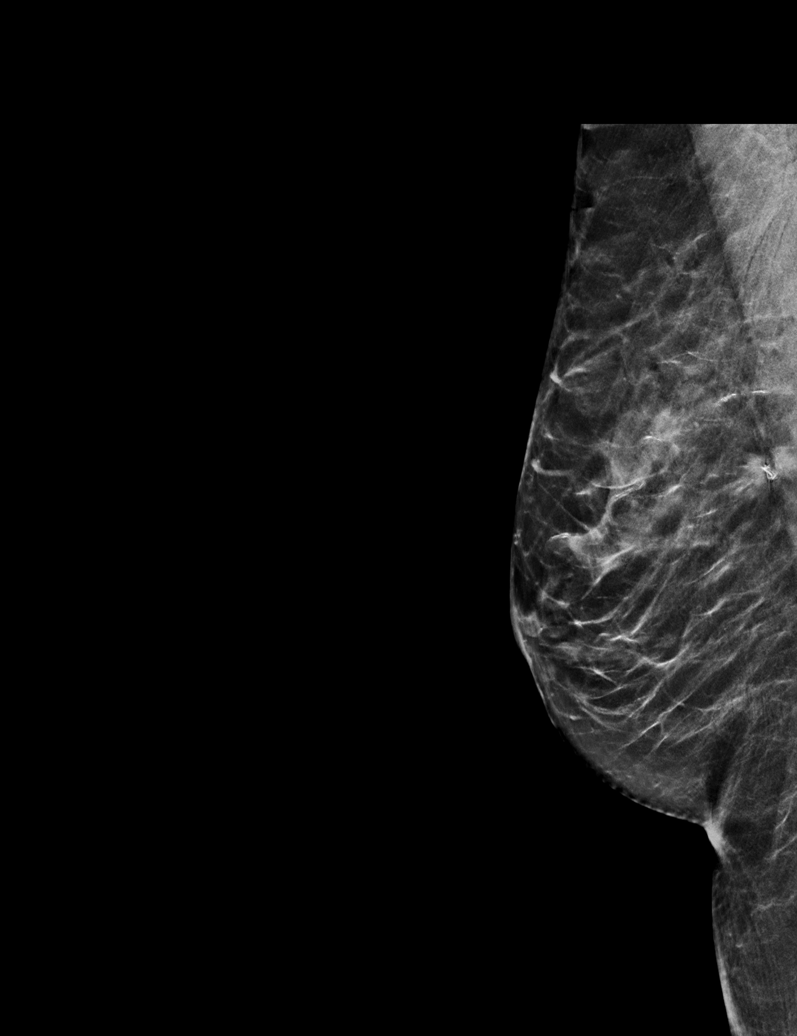

[R CC tomo · tomo slice 27/54.0]
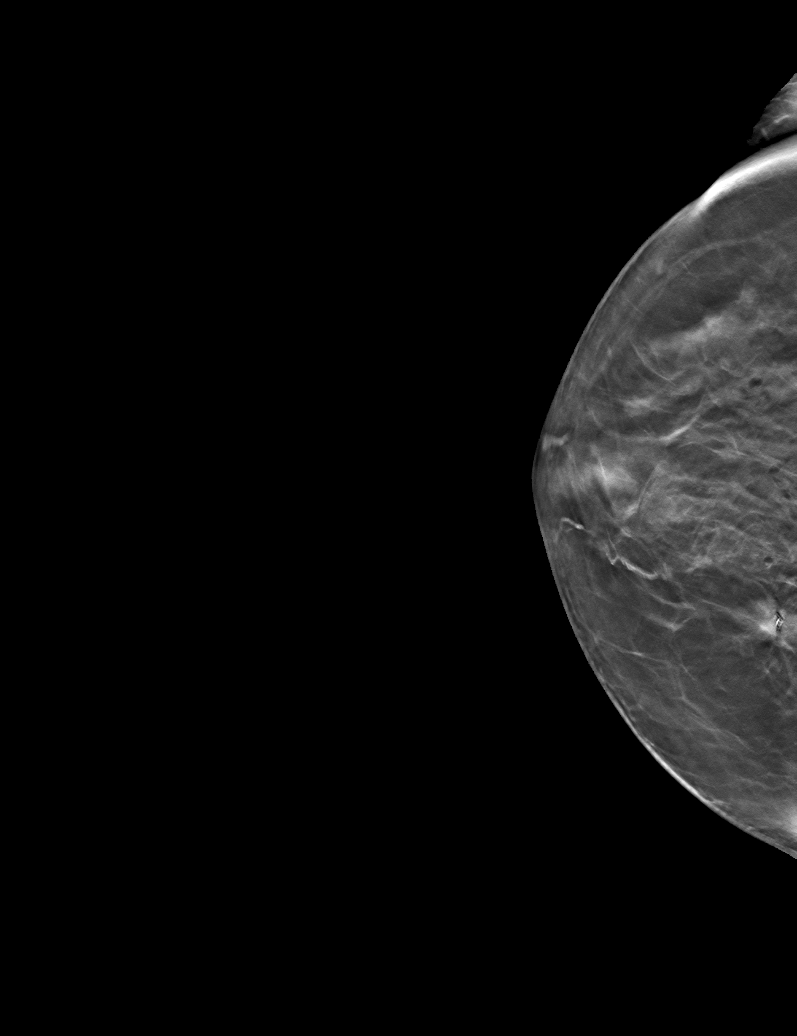

[R MLO tomo · tomo slice 26/51.0]
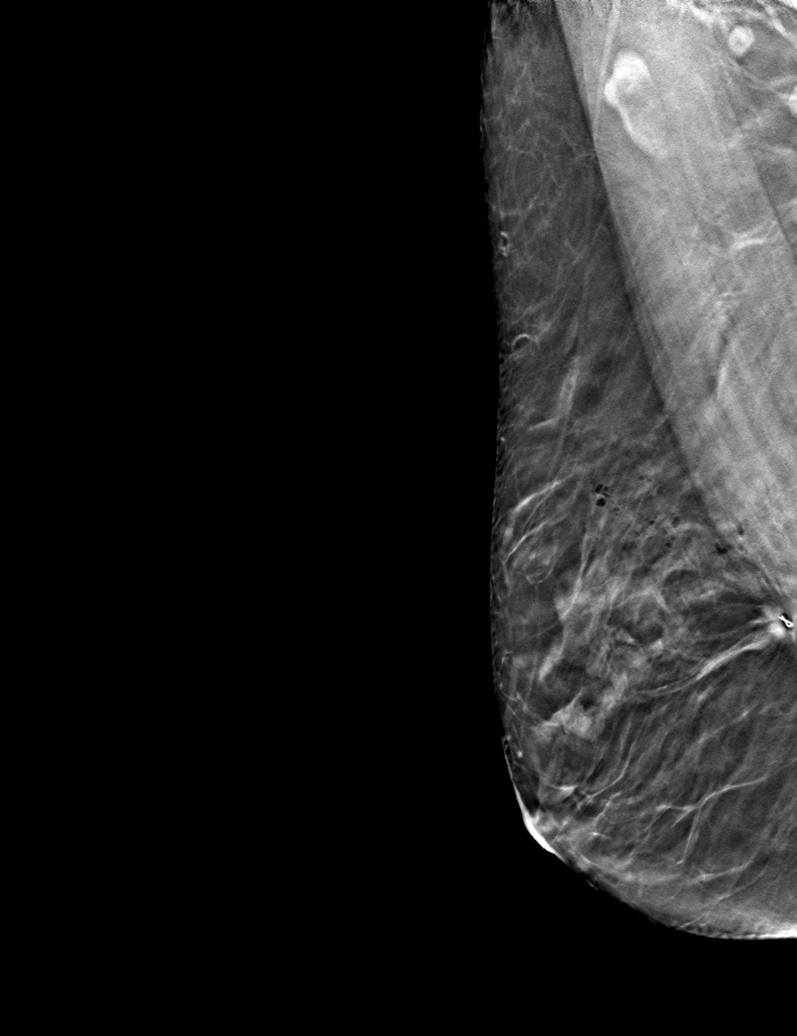

[R ML tomo · tomo slice 29/58.0]
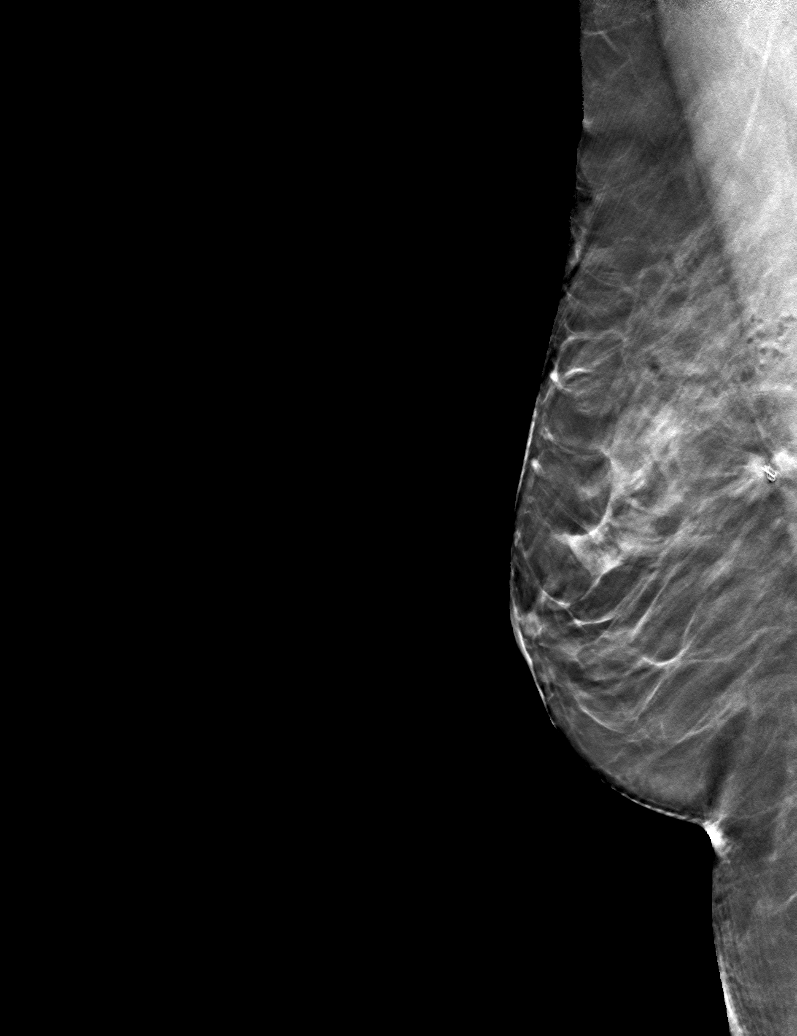

[6 of 18 positions shown; findings below may reference images not displayed]

FINDINGS: Mammographic images were obtained following ultrasound guided biopsy
of 1 cm mass at the 1 o'clock position of the RIGHT breast. The
RIBBON biopsy marking clip is in expected position at the site of
biopsy.
IMPRESSION: Appropriate positioning of the RIBBON shaped biopsy marking clip at
the site of biopsy in the UPPER INNER RIGHT breast.

Final Assessment: Post Procedure Mammograms for Marker Placement

## 2021-03-09 DIAGNOSIS — J449 Chronic obstructive pulmonary disease, unspecified: Secondary | ICD-10-CM | POA: Diagnosis not present

## 2021-04-09 DIAGNOSIS — J449 Chronic obstructive pulmonary disease, unspecified: Secondary | ICD-10-CM | POA: Diagnosis not present

## 2021-04-10 DIAGNOSIS — J449 Chronic obstructive pulmonary disease, unspecified: Secondary | ICD-10-CM | POA: Diagnosis not present

## 2021-05-09 DIAGNOSIS — J449 Chronic obstructive pulmonary disease, unspecified: Secondary | ICD-10-CM | POA: Diagnosis not present

## 2021-06-06 ENCOUNTER — Inpatient Hospital Stay (HOSPITAL_COMMUNITY): Admission: RE | Admit: 2021-06-06 | Payer: Medicare Other | Source: Ambulatory Visit

## 2021-06-06 ENCOUNTER — Inpatient Hospital Stay (HOSPITAL_COMMUNITY): Payer: Medicare Other | Attending: Hematology

## 2021-06-06 ENCOUNTER — Ambulatory Visit (HOSPITAL_COMMUNITY): Payer: Medicare Other

## 2021-06-09 DIAGNOSIS — J449 Chronic obstructive pulmonary disease, unspecified: Secondary | ICD-10-CM | POA: Diagnosis not present

## 2021-06-12 DIAGNOSIS — J449 Chronic obstructive pulmonary disease, unspecified: Secondary | ICD-10-CM | POA: Diagnosis not present

## 2021-06-13 ENCOUNTER — Ambulatory Visit (HOSPITAL_COMMUNITY): Payer: Medicare Other | Admitting: Hematology

## 2021-07-06 DIAGNOSIS — J449 Chronic obstructive pulmonary disease, unspecified: Secondary | ICD-10-CM | POA: Diagnosis not present

## 2021-07-06 DIAGNOSIS — E7489 Other specified disorders of carbohydrate metabolism: Secondary | ICD-10-CM | POA: Diagnosis not present

## 2021-07-06 DIAGNOSIS — R42 Dizziness and giddiness: Secondary | ICD-10-CM | POA: Diagnosis not present

## 2021-07-06 DIAGNOSIS — R519 Headache, unspecified: Secondary | ICD-10-CM | POA: Diagnosis not present

## 2021-07-06 DIAGNOSIS — G894 Chronic pain syndrome: Secondary | ICD-10-CM | POA: Diagnosis not present

## 2021-07-06 DIAGNOSIS — Z6823 Body mass index (BMI) 23.0-23.9, adult: Secondary | ICD-10-CM | POA: Diagnosis not present

## 2021-07-06 DIAGNOSIS — Z79891 Long term (current) use of opiate analgesic: Secondary | ICD-10-CM | POA: Diagnosis not present

## 2021-07-06 DIAGNOSIS — E559 Vitamin D deficiency, unspecified: Secondary | ICD-10-CM | POA: Diagnosis not present

## 2021-07-06 DIAGNOSIS — I700 Atherosclerosis of aorta: Secondary | ICD-10-CM | POA: Diagnosis not present

## 2021-07-06 DIAGNOSIS — I10 Essential (primary) hypertension: Secondary | ICD-10-CM | POA: Diagnosis not present

## 2021-07-10 ENCOUNTER — Other Ambulatory Visit (HOSPITAL_COMMUNITY): Payer: Self-pay | Admitting: Internal Medicine

## 2021-07-10 ENCOUNTER — Other Ambulatory Visit: Payer: Self-pay | Admitting: Internal Medicine

## 2021-07-10 DIAGNOSIS — R519 Headache, unspecified: Secondary | ICD-10-CM

## 2021-07-10 DIAGNOSIS — R413 Other amnesia: Secondary | ICD-10-CM

## 2021-07-10 DIAGNOSIS — R42 Dizziness and giddiness: Secondary | ICD-10-CM

## 2021-07-11 DIAGNOSIS — J449 Chronic obstructive pulmonary disease, unspecified: Secondary | ICD-10-CM | POA: Diagnosis not present

## 2021-07-13 ENCOUNTER — Other Ambulatory Visit (HOSPITAL_COMMUNITY): Payer: Self-pay | Admitting: Hematology

## 2021-07-19 ENCOUNTER — Ambulatory Visit: Payer: Medicare Other | Admitting: Gastroenterology

## 2021-07-19 ENCOUNTER — Encounter: Payer: Self-pay | Admitting: Internal Medicine

## 2021-08-03 DIAGNOSIS — G4700 Insomnia, unspecified: Secondary | ICD-10-CM | POA: Diagnosis not present

## 2021-08-06 ENCOUNTER — Emergency Department (HOSPITAL_COMMUNITY)
Admission: EM | Admit: 2021-08-06 | Discharge: 2021-08-06 | Disposition: A | Discharge status: Home / Self Care | Payer: Medicare Other

## 2021-08-06 ENCOUNTER — Other Ambulatory Visit: Payer: Self-pay

## 2021-08-07 ENCOUNTER — Encounter (HOSPITAL_COMMUNITY): Payer: Self-pay

## 2021-08-07 ENCOUNTER — Ambulatory Visit (HOSPITAL_COMMUNITY): Payer: Medicare Other

## 2021-08-09 ENCOUNTER — Other Ambulatory Visit (HOSPITAL_COMMUNITY): Payer: Self-pay

## 2021-08-09 MED ORDER — TAMOXIFEN CITRATE 20 MG PO TABS: 20.0000 mg | ORAL_TABLET | Freq: Every day | ORAL | 3 refills | Status: DC

## 2021-08-11 ENCOUNTER — Other Ambulatory Visit: Payer: Self-pay

## 2021-08-11 ENCOUNTER — Encounter (HOSPITAL_COMMUNITY): Payer: Self-pay | Admitting: *Deleted

## 2021-08-11 ENCOUNTER — Emergency Department (HOSPITAL_COMMUNITY)
Admission: EM | Admit: 2021-08-11 | Discharge: 2021-08-11 | Disposition: A | Discharge status: Home / Self Care | Payer: Medicare Other | Attending: Emergency Medicine | Admitting: Emergency Medicine

## 2021-08-11 DIAGNOSIS — U071 COVID-19: Secondary | ICD-10-CM | POA: Diagnosis not present

## 2021-08-11 DIAGNOSIS — Z5321 Procedure and treatment not carried out due to patient leaving prior to being seen by health care provider: Secondary | ICD-10-CM | POA: Insufficient documentation

## 2021-08-11 DIAGNOSIS — R0602 Shortness of breath: Secondary | ICD-10-CM | POA: Diagnosis present

## 2021-08-28 ENCOUNTER — Other Ambulatory Visit (HOSPITAL_COMMUNITY): Payer: Self-pay | Admitting: *Deleted

## 2021-09-13 ENCOUNTER — Other Ambulatory Visit (HOSPITAL_COMMUNITY): Payer: Self-pay | Admitting: *Deleted

## 2021-09-13 ENCOUNTER — Telehealth (HOSPITAL_COMMUNITY): Payer: Self-pay | Admitting: *Deleted

## 2021-09-13 MED ORDER — GABAPENTIN 300 MG PO CAPS: 300.0000 mg | ORAL_CAPSULE | Freq: Three times a day (TID) | ORAL | 3 refills | Status: DC | PRN

## 2021-09-13 NOTE — Telephone Encounter (Signed)
Patient advised that her prescription has been increased and she may take 2-3 times daily.

## 2021-09-13 NOTE — Telephone Encounter (Signed)
Patient called to request gabapentin 3 times a day, as the night dose helps her neuropathy, however, she is having rebound pain during the day.  Please advise.

## 2021-10-03 ENCOUNTER — Ambulatory Visit (HOSPITAL_COMMUNITY)
Admission: RE | Admit: 2021-10-03 | Discharge: 2021-10-03 | Disposition: A | Discharge status: Home / Self Care | Payer: Medicare Other | Source: Ambulatory Visit | Attending: Hematology | Admitting: Hematology

## 2021-10-03 ENCOUNTER — Inpatient Hospital Stay (HOSPITAL_COMMUNITY): Payer: Medicare Other | Attending: Hematology

## 2021-10-03 ENCOUNTER — Other Ambulatory Visit: Payer: Self-pay

## 2021-10-03 DIAGNOSIS — Z170 Estrogen receptor positive status [ER+]: Secondary | ICD-10-CM

## 2021-10-03 DIAGNOSIS — M810 Age-related osteoporosis without current pathological fracture: Secondary | ICD-10-CM | POA: Insufficient documentation

## 2021-10-03 DIAGNOSIS — F17200 Nicotine dependence, unspecified, uncomplicated: Secondary | ICD-10-CM | POA: Insufficient documentation

## 2021-10-03 DIAGNOSIS — Z853 Personal history of malignant neoplasm of breast: Secondary | ICD-10-CM | POA: Diagnosis not present

## 2021-10-03 DIAGNOSIS — R922 Inconclusive mammogram: Secondary | ICD-10-CM | POA: Diagnosis not present

## 2021-10-03 DIAGNOSIS — Z79899 Other long term (current) drug therapy: Secondary | ICD-10-CM | POA: Diagnosis not present

## 2021-10-03 DIAGNOSIS — C50211 Malignant neoplasm of upper-inner quadrant of right female breast: Secondary | ICD-10-CM | POA: Diagnosis not present

## 2021-10-03 LAB — COMPREHENSIVE METABOLIC PANEL
ALT: 11 U/L (ref 0–44)
AST: 11 U/L — ABNORMAL LOW (ref 15–41)
Albumin: 3.7 g/dL (ref 3.5–5.0)
Alkaline Phosphatase: 62 U/L (ref 38–126)
Anion gap: 7 (ref 5–15)
BUN: 17 mg/dL (ref 8–23)
CO2: 24 mmol/L (ref 22–32)
Calcium: 8.2 mg/dL — ABNORMAL LOW (ref 8.9–10.3)
Chloride: 103 mmol/L (ref 98–111)
Creatinine, Ser: 0.95 mg/dL (ref 0.44–1.00)
GFR, Estimated: >60 mL/min (ref >60)
Glucose, Bld: 81 mg/dL (ref 70–99)
Potassium: 4 mmol/L (ref 3.5–5.1)
Sodium: 134 mmol/L — ABNORMAL LOW (ref 135–145)
Total Bilirubin: 0.3 mg/dL (ref 0.3–1.2)
Total Protein: 6.9 g/dL (ref 6.5–8.1)

## 2021-10-03 LAB — CBC WITH DIFFERENTIAL/PLATELET
Abs Immature Granulocytes: 0.01 10*3/uL (ref 0.00–0.07)
Basophils Absolute: 0.1 10*3/uL (ref 0.0–0.1)
Basophils Relative: 1 %
Eosinophils Absolute: 0.2 10*3/uL (ref 0.0–0.5)
Eosinophils Relative: 4 %
HCT: 35 % — ABNORMAL LOW (ref 36.0–46.0)
Hemoglobin: 11.8 g/dL — ABNORMAL LOW (ref 12.0–15.0)
Immature Granulocytes: 0 %
Lymphocytes Relative: 32 %
Lymphs Abs: 2 10*3/uL (ref 0.7–4.0)
MCH: 33.2 pg (ref 26.0–34.0)
MCHC: 33.7 g/dL (ref 30.0–36.0)
MCV: 98.6 fL (ref 80.0–100.0)
Monocytes Absolute: 0.8 10*3/uL (ref 0.1–1.0)
Monocytes Relative: 13 %
Neutro Abs: 3.1 10*3/uL (ref 1.7–7.7)
Neutrophils Relative %: 50 %
Platelets: 209 10*3/uL (ref 150–400)
RBC: 3.55 MIL/uL — ABNORMAL LOW (ref 3.87–5.11)
RDW: 13.3 % (ref 11.5–15.5)
WBC: 6.2 10*3/uL (ref 4.0–10.5)
nRBC: 0 % (ref 0.0–0.2)

## 2021-10-03 LAB — VITAMIN D 25 HYDROXY (VIT D DEFICIENCY, FRACTURES): Vit D, 25-Hydroxy: 57.04 ng/mL (ref 30–100)

## 2021-10-12 DIAGNOSIS — G4700 Insomnia, unspecified: Secondary | ICD-10-CM | POA: Diagnosis not present

## 2021-10-12 DIAGNOSIS — G894 Chronic pain syndrome: Secondary | ICD-10-CM | POA: Diagnosis not present

## 2021-10-12 DIAGNOSIS — J449 Chronic obstructive pulmonary disease, unspecified: Secondary | ICD-10-CM | POA: Diagnosis not present

## 2021-10-12 DIAGNOSIS — I10 Essential (primary) hypertension: Secondary | ICD-10-CM | POA: Diagnosis not present

## 2021-10-12 DIAGNOSIS — M2550 Pain in unspecified joint: Secondary | ICD-10-CM | POA: Diagnosis not present

## 2021-10-12 DIAGNOSIS — M1991 Primary osteoarthritis, unspecified site: Secondary | ICD-10-CM | POA: Diagnosis not present

## 2021-10-12 DIAGNOSIS — Z6823 Body mass index (BMI) 23.0-23.9, adult: Secondary | ICD-10-CM | POA: Diagnosis not present

## 2021-10-14 DIAGNOSIS — J449 Chronic obstructive pulmonary disease, unspecified: Secondary | ICD-10-CM | POA: Diagnosis not present

## 2021-10-16 NOTE — Progress Notes (Shared)
[name] [address] [place] [address]   Patient Care Team: Redmond School, MD as PCP - General (Internal Medicine) Eloise Harman, DO as Consulting Physician (Internal Medicine) Eloise Harman, DO as Consulting Physician (Internal Medicine) Brien Mates, RN as Oncology Nurse Navigator (Oncology)  SUMMARY OF ONCOLOGIC HISTORY: Oncology History  Malignant neoplasm of upper-inner quadrant of right female breast ([place])  07/06/2020 Initial Diagnosis   Malignant neoplasm of upper-inner quadrant of right female breast ([place])   08/11/2020 Cancer Staging   Staging form: Breast, AJCC 8th Edition - Clinical stage from 08/11/2020: Stage IA (cT1c, cN0, cM0, G1, ER+, PR-, HER2-) - Signed by Derek Jack, MD on 08/11/2020      CHIEF COMPLIANT: ***   INTERVAL HISTORY: [name] is a 70 y.o. female here today for follow up of her ***. Her last visit was on {XX/XX/XXXX}. ***   REVIEW OF SYSTEMS:   Review of Systems - Oncology  I have reviewed the past medical history, past surgical history, social history and family history with the patient and they are unchanged from previous note.   ALLERGIES:   is allergic to varenicline, codeine, influenza virus vaccine, lithium, and hemophilus b polysaccharide vaccine.   MEDICATIONS:  Current Outpatient Medications  Medication Sig Dispense Refill   albuterol (PROVENTIL HFA;VENTOLIN HFA) 108 (90 BASE) MCG/ACT inhaler Inhale 2 puffs into the lungs every 6 (six) hours as needed for wheezing or shortness of breath.      albuterol (PROVENTIL) (2.5 MG/3ML) 0.083% nebulizer solution Take 2.5 mg by nebulization in the morning, at noon, and at bedtime.     alendronate (FOSAMAX) 70 MG tablet Take 70 mg by mouth every Friday.      ALPRAZolam (XANAX) 1 MG tablet Take 1 mg by mouth 4 (four) times daily as needed.     atorvastatin (LIPITOR) 20 MG tablet Take 20 mg by mouth every evening.      calcium  carbonate (OSCAL) 1500 (600 Ca) MG TABS tablet Take 600 mg of elemental calcium by mouth daily with breakfast.     carboxymethylcellulose (REFRESH PLUS) 0.5 % SOLN Place 1 drop into both eyes 3 (three) times daily as needed (dry/irritated eyes.).     Cholecalciferol (VITAMIN D) 50 MCG (2000 UT) tablet Take 2,000 Units by mouth daily.      clonazePAM (KLONOPIN) 1 MG tablet Take 1 tablet (1 mg total) by mouth 3 (three) times daily.     dicyclomine (BENTYL) 10 MG capsule Take 10 mg by mouth daily.      gabapentin (NEURONTIN) 300 MG capsule Take 1 capsule (300 mg total) by mouth 3 (three) times daily as needed. 90 capsule 3   omeprazole (PRILOSEC) 40 MG capsule Take 40 mg by mouth daily.      ondansetron (ZOFRAN) 4 MG tablet Take 1 tablet (4 mg total) by mouth every 6 (six) hours as needed for nausea or vomiting. 20 tablet 0   phenytoin (DILANTIN) 100 MG ER capsule Take 400 mg by mouth at bedtime.     primidone (MYSOLINE) 50 MG tablet Take 50 mg by mouth at bedtime.     tamoxifen (NOLVADEX) 20 MG tablet Take 1 tablet (20 mg total) by mouth daily. 90 tablet 3   tiZANidine (ZANAFLEX) 4 MG tablet Take 4 mg by mouth every 8 (eight) hours as needed for muscle spasms.      traZODone (DESYREL) 100 MG tablet Take 100 mg by mouth at bedtime.  No current facility-administered medications for this visit.     PHYSICAL EXAMINATION: Performance status (ECOG): {CHL ONC QQ:[id number]}  There were no vitals filed for this visit. Wt Readings from Last 3 Encounters:  12/19/20 128 lb 12.8 oz (58.4 kg)  12/09/20 135 lb 12.9 oz (61.6 kg)  08/11/20 135 lb 14.4 oz (61.6 kg)   Physical Exam  Breast Exam Chaperone: {Blank single:19197::"n/a","Daniel Khashchuk, MD","Kirstyn Evans"}     LABORATORY DATA:  I have reviewed the data as listed CMP Latest Ref Rng & Units 10/03/2021 12/09/2020 07/01/2020  Glucose 70 - 99 mg/dL 81 126(H) 119(H)  BUN 8 - 23 mg/dL _0 Creatinine 0.44 - 1.00 mg/dL 0.95 0.91 0.87   Sodium 135 - 145 mmol/L 134(L) 135 134(L)  Potassium 3.5 - 5.1 mmol/L 4.0 4.4 3.6  Chloride 98 - 111 mmol/L 103 105 102  CO2 22 - 32 mmol/L 24 19(L) 20(L)  Calcium 8.9 - 10.3 mg/dL 8.2(L) 8.8(L) 8.9  Total Protein 6.5 - 8.1 g/dL 6.9 8.0 7.3  Total Bilirubin 0.3 - 1.2 mg/dL 0.3 0.8 0.4  Alkaline Phos 38 - 126 U/L 62 68 112  AST 15 - 41 U/L 11(L) 27 16  ALT 0 - 44 U/L _1 No results found for: PPI951 Lab Results  Component Value Date   WBC 6.2 10/03/2021   HGB 11.8 (L) 10/03/2021   HCT 35.0 (L) 10/03/2021   MCV 98.6 10/03/2021   PLT 209 10/03/2021   NEUTROABS 3.1 10/03/2021    ASSESSMENT:  ***   PLAN:  ***  Breast Cancer therapy associated bone loss: I have recommended calcium, Vitamin D and weight bearing exercises.  Orders placed this encounter:  No orders of the defined types were placed in this encounter.   The patient has a good understanding of the overall plan. She agrees with it. She will call with any problems that may develop before the next visit here.  Derek Jack, MD [facility] [phone]   I, ***, am acting as a scribe for Dr. Derek Jack.  {Add Barista Statement}

## 2021-10-17 ENCOUNTER — Inpatient Hospital Stay (HOSPITAL_COMMUNITY): Payer: Medicare Other | Admitting: Hematology

## 2021-10-17 NOTE — Progress Notes (Signed)
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 [name] is a 70 y.o. female here today for follow up of her right breast cancer. Her last visit was on 12/19/2020.   Today she reports feeling well. She reports feeling dizzy. She is taking tamoxifen and tolerating it well. She reports stable hot flashes. She denies hematochezia and black stools.   REVIEW OF SYSTEMS:   Review of Systems  Constitutional:  Negative for appetite change and fatigue (75%).  Gastrointestinal:  Negative for blood in stool.  Endocrine: Positive for hot flashes.  Musculoskeletal:  Positive for arthralgias (8/10).  Neurological:  Positive for dizziness.  Psychiatric/Behavioral:  Positive for sleep disturbance.   All other systems reviewed and are negative.  I have reviewed the past medical history, past surgical history, social history and family history with the patient and they are unchanged from previous note.   ALLERGIES:   is allergic to varenicline, codeine, influenza virus vaccine, lithium, and hemophilus b polysaccharide vaccine.   MEDICATIONS:  Current Outpatient Medications  Medication Sig Dispense  Refill   albuterol (PROVENTIL HFA;VENTOLIN HFA) 108 (90 BASE) MCG/ACT inhaler Inhale 2 puffs into the lungs every 6 (six) hours as needed for wheezing or shortness of breath.      albuterol (PROVENTIL) (2.5 MG/3ML) 0.083% nebulizer solution Take 2.5 mg by nebulization in the morning, at noon, and at bedtime.     alendronate (FOSAMAX) 70 MG tablet Take 70 mg by mouth every Friday.      ALPRAZolam (XANAX) 1 MG tablet Take 1 mg by mouth 4 (four) times daily as needed.     atorvastatin (LIPITOR) 20 MG tablet Take 20 mg by mouth every evening.      calcium carbonate (OSCAL) 1500 (600 Ca) MG TABS tablet Take 600 mg of elemental calcium by mouth daily with breakfast.     carboxymethylcellulose (REFRESH PLUS) 0.5 % SOLN Place 1 drop into both eyes 3 (three) times daily as needed (dry/irritated eyes.).     Cholecalciferol (VITAMIN D) 50 MCG (2000 UT) tablet Take 2,000 Units by mouth daily.      clonazePAM (KLONOPIN) 1 MG tablet Take 1 tablet (1 mg total) by mouth 3 (three) times daily.     dicyclomine (BENTYL) 10 MG capsule Take 10 mg by mouth daily.      gabapentin (NEURONTIN) 300 MG capsule Take 1 capsule (300 mg total) by mouth 3 (three) times daily as needed. 90 capsule 3   omeprazole (PRILOSEC) 40 MG capsule Take 40 mg by mouth daily.      ondansetron (ZOFRAN) 4 MG tablet Take 1 tablet (4 mg total) by mouth every 6 (six) hours as needed for nausea or vomiting. 20 tablet 0   oxyCODONE-acetaminophen (PERCOCET)  10-325 MG tablet Take 1 tablet by mouth every 4 (four) hours.     phenytoin (DILANTIN) 100 MG ER capsule Take 400 mg by mouth at bedtime.     primidone (MYSOLINE) 50 MG tablet Take 50 mg by mouth at bedtime.     tamoxifen (NOLVADEX) 20 MG tablet Take 1 tablet (20 mg total) by mouth daily. 90 tablet 3   tiZANidine (ZANAFLEX) 4 MG tablet Take 4 mg by mouth every 8 (eight) hours as needed for muscle spasms.      traZODone (DESYREL) 100 MG tablet Take 100 mg by mouth at bedtime.      No current  facility-administered medications for this visit.     PHYSICAL EXAMINATION: Performance status (ECOG): 1 - Symptomatic but completely ambulatory  Vitals:   10/18/21 1436  BP: 138/74  Pulse: 83  Resp: 16  Temp: 97.6 F (36.4 C)  SpO2: 98%   Wt Readings from Last 3 Encounters:  10/18/21 131 lb 12.8 oz (59.8 kg)  12/19/20 128 lb 12.8 oz (58.4 kg)  12/09/20 135 lb 12.9 oz (61.6 kg)   Physical Exam Vitals reviewed.  Constitutional:      Appearance: Normal appearance.  Cardiovascular:     Rate and Rhythm: Normal rate and regular rhythm.     Pulses: Normal pulses.     Heart sounds: Normal heart sounds.  Pulmonary:     Effort: Pulmonary effort is normal.     Breath sounds: Normal breath sounds.  Chest:  Breasts:    Right: Normal. No swelling, bleeding, inverted nipple, mass, nipple discharge, skin change (UIQ lumpectomy scar within normal limits) or tenderness.     Left: Normal. No swelling, bleeding, inverted nipple, mass, nipple discharge, skin change or tenderness.  Abdominal:     Palpations: Abdomen is soft. There is no hepatomegaly, splenomegaly or mass.     Tenderness: There is no abdominal tenderness.  Lymphadenopathy:     Upper Body:     Right upper body: No supraclavicular, axillary or pectoral adenopathy.     Left upper body: Axillary adenopathy (sbu cm lymph node) present. No supraclavicular or pectoral adenopathy.  Neurological:     General: No focal deficit present.     Mental Status: She is alert and oriented to person, place, and time.  Psychiatric:        Mood and Affect: Mood normal.        Behavior: Behavior normal.    Breast Exam Chaperone: Thana Ates     LABORATORY DATA:  I have reviewed the data as listed CMP Latest Ref Rng & Units 10/03/2021 12/09/2020 07/01/2020  Glucose 70 - 99 mg/dL 81 126(H) 119(H)  BUN 8 - 23 mg/dL '17 8 8  ' Creatinine 0.44 - 1.00 mg/dL 0.95 0.91 0.87  Sodium 135 - 145 mmol/L 134(L) 135 134(L)  Potassium 3.5 - 5.1 mmol/L  4.0 4.4 3.6  Chloride 98 - 111 mmol/L 103 105 102  CO2 22 - 32 mmol/L 24 19(L) 20(L)  Calcium 8.9 - 10.3 mg/dL 8.2(L) 8.8(L) 8.9  Total Protein 6.5 - 8.1 g/dL 6.9 8.0 7.3  Total Bilirubin 0.3 - 1.2 mg/dL 0.3 0.8 0.4  Alkaline Phos 38 - 126 U/L 62 68 112  AST 15 - 41 U/L 11(L) 27 16  ALT 0 - 44 U/L '11 17 15   ' No results found for: [id number] Lab Results  Component Value Date   WBC 6.2 10/03/2021   HGB 11.8 (L) 10/03/2021   HCT 35.0 (L) 10/03/2021  MCV 98.6 10/03/2021   PLT 209 10/03/2021   NEUTROABS 3.1 10/03/2021    ASSESSMENT:  1.  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  No lymphadenopathy. -Right breast 1:00 biopsy on 06/07/2020 consistent with invasive ductal carcinoma, ER 95% positive, PR/HER-2 negative, Ki-67 1%. -Right lumpectomy and sentinel lymph node biopsy on 07/06/2020-pathology 1.2 cm invasive ductal carcinoma, grade 1, superior margin focally positive for carcinoma in multiple areas, 0.2 cm from posterior margin, 0/3 lymph nodes involved, no DCIS, negative LVI/perineural invasion. -Reexcision of new superior margin on 07/21/2019 were negative for carcinoma. -Recent CT CAP angio on 05/08/2020 did not show any features suggestive of metastatic disease.   2.  Social/family history: -Lives at home with her husband and son.  She used to work in [employer] prior to retirement. -Current active smoker, half pack per day for 40 years. -No family history of malignancies.   3.  Osteoporosis: -DEXA scan on 05/31/2020 with T score -3.4. -Fosamax initiated by Dr. Gerarda Fraction in end of August 2021.   PLAN:  1.  Stage I right breast cancer, ER positive: - She is tolerating tamoxifen very well. - Physical examination today shows right breast upper inner quadrant lumpectomy scar is unchanged.  No palpable masses in bilateral breast.   There is a 5 mm tender lymph node under the left axilla. - We reviewed mammogram from 10/03/2021 which was BI-RADS Category 2.  No lymphadenopathy visualized. - Reviewed labs from 10/03/2021 which showed normal LFTs.  CBC was grossly normal. - Recommend continue tamoxifen.  RTC 6 months for follow-up.   2.  Osteoporosis: - She will continue Fosamax weekly.  Vitamin D level is 57.  Continue calcium vitamin D supplements.  Breast Cancer therapy associated bone loss: I have recommended calcium, Vitamin D and weight bearing exercises.  Orders placed this encounter:  No orders of the defined types were placed in this encounter.   The patient has a good understanding of the overall plan. She agrees with it. She will call with any problems that may develop before the next visit here.  Derek Jack, MD [facility] [phone]   I, Thana Ates, am acting as a scribe for Dr. Derek Jack.  I, Derek Jack MD, have reviewed the above documentation for accuracy and completeness, and I agree with the above.

## 2021-10-18 ENCOUNTER — Inpatient Hospital Stay (HOSPITAL_BASED_OUTPATIENT_CLINIC_OR_DEPARTMENT_OTHER): Payer: Medicare Other | Admitting: Hematology

## 2021-10-18 ENCOUNTER — Encounter (HOSPITAL_COMMUNITY): Payer: Self-pay | Admitting: Hematology

## 2021-10-18 ENCOUNTER — Other Ambulatory Visit: Payer: Self-pay

## 2021-10-18 VITALS — BP 138/74 | HR 83 | Temp 97.6°F | Resp 16 | Wt 131.8 lb

## 2021-10-18 DIAGNOSIS — C50211 Malignant neoplasm of upper-inner quadrant of right female breast: Secondary | ICD-10-CM | POA: Insufficient documentation

## 2021-10-18 DIAGNOSIS — F17200 Nicotine dependence, unspecified, uncomplicated: Secondary | ICD-10-CM | POA: Diagnosis not present

## 2021-10-18 DIAGNOSIS — M810 Age-related osteoporosis without current pathological fracture: Secondary | ICD-10-CM | POA: Insufficient documentation

## 2021-10-18 DIAGNOSIS — Z170 Estrogen receptor positive status [ER+]: Secondary | ICD-10-CM | POA: Diagnosis not present

## 2021-10-18 DIAGNOSIS — Z79899 Other long term (current) drug therapy: Secondary | ICD-10-CM | POA: Diagnosis not present

## 2021-10-18 NOTE — Patient Instructions (Addendum)
[place] at [facility] Discharge Instructions  You were seen and examined today by Dr. Delton Coombes. He reviewed your most recent labs and everything looks okay. Your hemoglobin has dropped to 11.8. Continue taking the Tamoxifen as prescribed along with Vitamin D and Calcium supplements. Please keep follow up appointment as scheduled in 6 months.   Thank you for choosing [place] at [facility] to provide your oncology and hematology care.  To afford each patient quality time with our provider, please arrive at least 15 minutes before your scheduled appointment time.   If you have a lab appointment with the [facility] please come in thru the Main Entrance and check in at the main information desk.  You need to re-schedule your appointment should you arrive 10 or more minutes late.  We strive to give you quality time with our providers, and arriving late affects you and other patients whose appointments are after yours.  Also, if you no show three or more times for appointments you may be dismissed from the clinic at the providers discretion.     Again, thank you for choosing [facility].  Our hope is that these requests will decrease the amount of time that you wait before being seen by our physicians.       _____________________________________________________________  Should you have questions after your visit to [facility], please contact our office at [phone] and follow the prompts.  Our office hours are 8:00 a.m. and 4:30 p.m. Monday - Friday.  Please note that voicemails left after 4:00 p.m. may not be returned until the following business day.  We are closed weekends and major holidays.  You do have access to a nurse 24-7, just call the main number to the clinic [phone] and do not press any options, hold on the line and a nurse will answer the phone.    For prescription refill requests, have your  pharmacy contact our office and allow 72 hours.    Due to Covid, you will need to wear a mask upon entering the hospital. If you do not have a mask, a mask will be given to you at the Main Entrance upon arrival. For doctor visits, patients may have 1 support person age 59 or older with them. For treatment visits, patients can not have anyone with them due to social distancing guidelines and our immunocompromised population.

## 2021-11-10 DIAGNOSIS — M2550 Pain in unspecified joint: Secondary | ICD-10-CM | POA: Diagnosis not present

## 2021-11-10 DIAGNOSIS — J449 Chronic obstructive pulmonary disease, unspecified: Secondary | ICD-10-CM | POA: Diagnosis not present

## 2021-11-10 DIAGNOSIS — I10 Essential (primary) hypertension: Secondary | ICD-10-CM | POA: Diagnosis not present

## 2021-11-10 DIAGNOSIS — G894 Chronic pain syndrome: Secondary | ICD-10-CM | POA: Diagnosis not present

## 2021-11-10 DIAGNOSIS — E559 Vitamin D deficiency, unspecified: Secondary | ICD-10-CM | POA: Diagnosis not present

## 2021-11-10 DIAGNOSIS — Z0001 Encounter for general adult medical examination with abnormal findings: Secondary | ICD-10-CM | POA: Diagnosis not present

## 2021-11-10 DIAGNOSIS — Z6823 Body mass index (BMI) 23.0-23.9, adult: Secondary | ICD-10-CM | POA: Diagnosis not present

## 2021-11-10 DIAGNOSIS — E538 Deficiency of other specified B group vitamins: Secondary | ICD-10-CM | POA: Diagnosis not present

## 2021-11-10 DIAGNOSIS — Z13220 Encounter for screening for lipoid disorders: Secondary | ICD-10-CM | POA: Diagnosis not present

## 2021-11-10 DIAGNOSIS — E7849 Other hyperlipidemia: Secondary | ICD-10-CM | POA: Diagnosis not present

## 2021-11-10 DIAGNOSIS — R0989 Other specified symptoms and signs involving the circulatory and respiratory systems: Secondary | ICD-10-CM | POA: Diagnosis not present

## 2021-11-13 ENCOUNTER — Other Ambulatory Visit: Payer: Self-pay | Admitting: Internal Medicine

## 2021-11-13 DIAGNOSIS — R0989 Other specified symptoms and signs involving the circulatory and respiratory systems: Secondary | ICD-10-CM

## 2021-11-14 DIAGNOSIS — J449 Chronic obstructive pulmonary disease, unspecified: Secondary | ICD-10-CM | POA: Diagnosis not present

## 2021-12-11 DIAGNOSIS — J449 Chronic obstructive pulmonary disease, unspecified: Secondary | ICD-10-CM | POA: Diagnosis not present

## 2021-12-11 DIAGNOSIS — Z79899 Other long term (current) drug therapy: Secondary | ICD-10-CM | POA: Diagnosis not present

## 2021-12-14 ENCOUNTER — Other Ambulatory Visit (HOSPITAL_COMMUNITY): Payer: Self-pay | Admitting: Hematology

## 2021-12-15 DIAGNOSIS — Z79899 Other long term (current) drug therapy: Secondary | ICD-10-CM | POA: Diagnosis not present

## 2022-01-12 DIAGNOSIS — J449 Chronic obstructive pulmonary disease, unspecified: Secondary | ICD-10-CM | POA: Diagnosis not present

## 2022-02-07 DIAGNOSIS — R58 Hemorrhage, not elsewhere classified: Secondary | ICD-10-CM | POA: Diagnosis not present

## 2022-02-07 DIAGNOSIS — S0990XA Unspecified injury of head, initial encounter: Secondary | ICD-10-CM | POA: Diagnosis not present

## 2022-02-12 DIAGNOSIS — S0003XA Contusion of scalp, initial encounter: Secondary | ICD-10-CM | POA: Diagnosis not present

## 2022-02-12 DIAGNOSIS — J449 Chronic obstructive pulmonary disease, unspecified: Secondary | ICD-10-CM | POA: Diagnosis not present

## 2022-02-12 DIAGNOSIS — M1991 Primary osteoarthritis, unspecified site: Secondary | ICD-10-CM | POA: Diagnosis not present

## 2022-02-12 DIAGNOSIS — Z6824 Body mass index (BMI) 24.0-24.9, adult: Secondary | ICD-10-CM | POA: Diagnosis not present

## 2022-02-12 DIAGNOSIS — S0990XA Unspecified injury of head, initial encounter: Secondary | ICD-10-CM | POA: Diagnosis not present

## 2022-02-20 ENCOUNTER — Other Ambulatory Visit (HOSPITAL_COMMUNITY): Payer: Self-pay | Admitting: Hematology

## 2022-02-27 ENCOUNTER — Encounter: Payer: Self-pay | Admitting: Internal Medicine

## 2022-02-27 ENCOUNTER — Ambulatory Visit: Payer: Medicare Other | Admitting: Gastroenterology

## 2022-03-12 DIAGNOSIS — J449 Chronic obstructive pulmonary disease, unspecified: Secondary | ICD-10-CM | POA: Diagnosis not present

## 2022-03-23 ENCOUNTER — Other Ambulatory Visit (HOSPITAL_COMMUNITY): Payer: Self-pay | Admitting: Hematology

## 2022-04-12 DIAGNOSIS — J449 Chronic obstructive pulmonary disease, unspecified: Secondary | ICD-10-CM | POA: Diagnosis not present

## 2022-04-16 ENCOUNTER — Other Ambulatory Visit (HOSPITAL_COMMUNITY): Payer: Self-pay | Admitting: Hematology

## 2022-04-18 ENCOUNTER — Inpatient Hospital Stay (HOSPITAL_COMMUNITY): Payer: Medicare Other | Attending: Internal Medicine

## 2022-04-25 ENCOUNTER — Ambulatory Visit (HOSPITAL_COMMUNITY): Payer: Medicare Other | Admitting: Hematology

## 2022-05-28 DIAGNOSIS — I10 Essential (primary) hypertension: Secondary | ICD-10-CM | POA: Diagnosis not present

## 2022-05-28 DIAGNOSIS — K219 Gastro-esophageal reflux disease without esophagitis: Secondary | ICD-10-CM | POA: Diagnosis not present

## 2022-05-28 DIAGNOSIS — J449 Chronic obstructive pulmonary disease, unspecified: Secondary | ICD-10-CM | POA: Diagnosis not present

## 2022-06-05 DIAGNOSIS — J449 Chronic obstructive pulmonary disease, unspecified: Secondary | ICD-10-CM | POA: Diagnosis not present

## 2022-06-05 DIAGNOSIS — Z6821 Body mass index (BMI) 21.0-21.9, adult: Secondary | ICD-10-CM | POA: Diagnosis not present

## 2022-06-05 DIAGNOSIS — I700 Atherosclerosis of aorta: Secondary | ICD-10-CM | POA: Diagnosis not present

## 2022-06-05 DIAGNOSIS — C50211 Malignant neoplasm of upper-inner quadrant of right female breast: Secondary | ICD-10-CM | POA: Diagnosis not present

## 2022-06-28 DIAGNOSIS — J449 Chronic obstructive pulmonary disease, unspecified: Secondary | ICD-10-CM | POA: Diagnosis not present

## 2022-06-28 DIAGNOSIS — K219 Gastro-esophageal reflux disease without esophagitis: Secondary | ICD-10-CM | POA: Diagnosis not present

## 2022-06-28 DIAGNOSIS — I10 Essential (primary) hypertension: Secondary | ICD-10-CM | POA: Diagnosis not present

## 2022-07-03 DIAGNOSIS — T17920A Food in respiratory tract, part unspecified causing asphyxiation, initial encounter: Secondary | ICD-10-CM | POA: Diagnosis not present

## 2022-07-03 DIAGNOSIS — I959 Hypotension, unspecified: Secondary | ICD-10-CM | POA: Diagnosis not present

## 2022-07-04 ENCOUNTER — Emergency Department (HOSPITAL_COMMUNITY): Payer: Medicare Other

## 2022-07-04 ENCOUNTER — Ambulatory Visit (HOSPITAL_COMMUNITY)
Admission: EM | Admit: 2022-07-04 | Discharge: 2022-07-04 | Disposition: A | Discharge status: Home / Self Care | Payer: Medicare Other | Attending: Emergency Medicine | Admitting: Emergency Medicine

## 2022-07-04 ENCOUNTER — Encounter (HOSPITAL_COMMUNITY)
Admission: EM | Disposition: A | Discharge status: Home / Self Care | Payer: Self-pay | Source: Home / Self Care | Attending: Emergency Medicine

## 2022-07-04 ENCOUNTER — Emergency Department (HOSPITAL_COMMUNITY): Payer: Medicare Other | Admitting: Anesthesiology

## 2022-07-04 ENCOUNTER — Emergency Department (EMERGENCY_DEPARTMENT_HOSPITAL): Payer: Medicare Other | Admitting: Anesthesiology

## 2022-07-04 ENCOUNTER — Encounter (HOSPITAL_COMMUNITY): Payer: Self-pay

## 2022-07-04 ENCOUNTER — Other Ambulatory Visit: Payer: Self-pay

## 2022-07-04 DIAGNOSIS — F17200 Nicotine dependence, unspecified, uncomplicated: Secondary | ICD-10-CM | POA: Insufficient documentation

## 2022-07-04 DIAGNOSIS — Z79899 Other long term (current) drug therapy: Secondary | ICD-10-CM | POA: Diagnosis not present

## 2022-07-04 DIAGNOSIS — Z743 Need for continuous supervision: Secondary | ICD-10-CM | POA: Diagnosis not present

## 2022-07-04 DIAGNOSIS — F419 Anxiety disorder, unspecified: Secondary | ICD-10-CM | POA: Insufficient documentation

## 2022-07-04 DIAGNOSIS — R5381 Other malaise: Secondary | ICD-10-CM | POA: Diagnosis not present

## 2022-07-04 DIAGNOSIS — T17920A Food in respiratory tract, part unspecified causing asphyxiation, initial encounter: Secondary | ICD-10-CM | POA: Diagnosis not present

## 2022-07-04 DIAGNOSIS — J449 Chronic obstructive pulmonary disease, unspecified: Secondary | ICD-10-CM | POA: Diagnosis not present

## 2022-07-04 DIAGNOSIS — Z8711 Personal history of peptic ulcer disease: Secondary | ICD-10-CM | POA: Diagnosis not present

## 2022-07-04 DIAGNOSIS — X58XXXA Exposure to other specified factors, initial encounter: Secondary | ICD-10-CM | POA: Diagnosis not present

## 2022-07-04 DIAGNOSIS — M1990 Unspecified osteoarthritis, unspecified site: Secondary | ICD-10-CM | POA: Diagnosis not present

## 2022-07-04 DIAGNOSIS — Z8673 Personal history of transient ischemic attack (TIA), and cerebral infarction without residual deficits: Secondary | ICD-10-CM | POA: Diagnosis not present

## 2022-07-04 DIAGNOSIS — K449 Diaphragmatic hernia without obstruction or gangrene: Secondary | ICD-10-CM | POA: Diagnosis not present

## 2022-07-04 DIAGNOSIS — J441 Chronic obstructive pulmonary disease with (acute) exacerbation: Secondary | ICD-10-CM | POA: Insufficient documentation

## 2022-07-04 DIAGNOSIS — F17210 Nicotine dependence, cigarettes, uncomplicated: Secondary | ICD-10-CM | POA: Insufficient documentation

## 2022-07-04 DIAGNOSIS — R569 Unspecified convulsions: Secondary | ICD-10-CM | POA: Diagnosis not present

## 2022-07-04 DIAGNOSIS — F319 Bipolar disorder, unspecified: Secondary | ICD-10-CM | POA: Diagnosis not present

## 2022-07-04 DIAGNOSIS — T18128A Food in esophagus causing other injury, initial encounter: Secondary | ICD-10-CM | POA: Insufficient documentation

## 2022-07-04 DIAGNOSIS — K219 Gastro-esophageal reflux disease without esophagitis: Secondary | ICD-10-CM | POA: Insufficient documentation

## 2022-07-04 DIAGNOSIS — R52 Pain, unspecified: Secondary | ICD-10-CM | POA: Diagnosis not present

## 2022-07-04 DIAGNOSIS — F449 Dissociative and conversion disorder, unspecified: Secondary | ICD-10-CM

## 2022-07-04 DIAGNOSIS — R1310 Dysphagia, unspecified: Secondary | ICD-10-CM

## 2022-07-04 DIAGNOSIS — Z7951 Long term (current) use of inhaled steroids: Secondary | ICD-10-CM | POA: Diagnosis not present

## 2022-07-04 DIAGNOSIS — Z9981 Dependence on supplemental oxygen: Secondary | ICD-10-CM | POA: Insufficient documentation

## 2022-07-04 DIAGNOSIS — R0902 Hypoxemia: Secondary | ICD-10-CM | POA: Diagnosis not present

## 2022-07-04 HISTORY — PX: ESOPHAGOGASTRODUODENOSCOPY (EGD) WITH PROPOFOL: SHX5813

## 2022-07-04 HISTORY — PX: ESOPHAGEAL BRUSHING: SHX6842

## 2022-07-04 LAB — BASIC METABOLIC PANEL
Anion gap: 3 — ABNORMAL LOW (ref 5–15)
BUN: 15 mg/dL (ref 8–23)
CO2: 25 mmol/L (ref 22–32)
Calcium: 7.8 mg/dL — ABNORMAL LOW (ref 8.9–10.3)
Chloride: 111 mmol/L (ref 98–111)
Creatinine, Ser: 0.95 mg/dL (ref 0.44–1.00)
GFR, Estimated: >60 mL/min (ref >60)
Glucose, Bld: 110 mg/dL — ABNORMAL HIGH (ref 70–99)
Potassium: 4.2 mmol/L (ref 3.5–5.1)
Sodium: 139 mmol/L (ref 135–145)

## 2022-07-04 LAB — CBC WITH DIFFERENTIAL/PLATELET
Abs Immature Granulocytes: 0.03 10*3/uL (ref 0.00–0.07)
Basophils Absolute: 0.1 10*3/uL (ref 0.0–0.1)
Basophils Relative: 1 %
Eosinophils Absolute: 0.2 10*3/uL (ref 0.0–0.5)
Eosinophils Relative: 3 %
HCT: 36.9 % (ref 36.0–46.0)
Hemoglobin: 12.1 g/dL (ref 12.0–15.0)
Immature Granulocytes: 0 %
Lymphocytes Relative: 19 %
Lymphs Abs: 1.7 10*3/uL (ref 0.7–4.0)
MCH: 31.8 pg (ref 26.0–34.0)
MCHC: 32.8 g/dL (ref 30.0–36.0)
MCV: 97.1 fL (ref 80.0–100.0)
Monocytes Absolute: 0.7 10*3/uL (ref 0.1–1.0)
Monocytes Relative: 8 %
Neutro Abs: 6.3 10*3/uL (ref 1.7–7.7)
Neutrophils Relative %: 69 %
Platelets: 211 10*3/uL (ref 150–400)
RBC: 3.8 MIL/uL — ABNORMAL LOW (ref 3.87–5.11)
RDW: 14.2 % (ref 11.5–15.5)
WBC: 9 10*3/uL (ref 4.0–10.5)
nRBC: 0 % (ref 0.0–0.2)

## 2022-07-04 LAB — KOH PREP

## 2022-07-04 SURGERY — ESOPHAGOGASTRODUODENOSCOPY (EGD) WITH PROPOFOL
Anesthesia: General

## 2022-07-04 MED ORDER — PROPOFOL 10 MG/ML IV BOLUS
INTRAVENOUS | Status: DC | PRN
  Administered 2022-07-04: 120 mg via INTRAVENOUS

## 2022-07-04 MED ORDER — SUCCINYLCHOLINE CHLORIDE 200 MG/10ML IV SOSY
PREFILLED_SYRINGE | INTRAVENOUS | Status: DC | PRN
  Administered 2022-07-04: 120 mg via INTRAVENOUS

## 2022-07-04 MED ORDER — DEXAMETHASONE SODIUM PHOSPHATE 10 MG/ML IJ SOLN
10.0000 mg | Freq: Once | INTRAMUSCULAR | Status: AC
  Administered 2022-07-04: 10 mg via INTRAVENOUS
  Filled 2022-07-04: qty 1

## 2022-07-04 MED ORDER — IPRATROPIUM-ALBUTEROL 0.5-2.5 (3) MG/3ML IN SOLN
3.0000 mL | Freq: Once | RESPIRATORY_TRACT | Status: AC
  Administered 2022-07-04: 3 mL via RESPIRATORY_TRACT
  Filled 2022-07-04: qty 3

## 2022-07-04 MED ORDER — LACTATED RINGERS IV SOLN: INTRAVENOUS | Status: DC | PRN

## 2022-07-04 MED ORDER — GLUCAGON HCL RDNA (DIAGNOSTIC) 1 MG IJ SOLR
1.0000 mg | Freq: Once | INTRAMUSCULAR | Status: AC
  Administered 2022-07-04: 1 mg via INTRAVENOUS
  Filled 2022-07-04: qty 1

## 2022-07-04 MED ORDER — LIDOCAINE 2% (20 MG/ML) 5 ML SYRINGE
INTRAMUSCULAR | Status: DC | PRN
  Administered 2022-07-04: 50 mg via INTRAVENOUS

## 2022-07-04 NOTE — Anesthesia Procedure Notes (Signed)
Procedure Name: Intubation Date/Time: 07/04/2022 11:33 AM  Performed by: Myna Bright, CRNAPre-anesthesia Checklist: Patient identified, Emergency Drugs available, Suction available and Patient being monitored Patient Re-evaluated:Patient Re-evaluated prior to induction Oxygen Delivery Method: Circle system utilized Preoxygenation: Pre-oxygenation with 100% oxygen Induction Type: IV induction, Rapid sequence and Cricoid Pressure applied Laryngoscope Size: Mac and 3 Grade View: Grade II Tube type: Oral Tube size: 7.0 mm Number of attempts: 1 Airway Equipment and Method: Stylet Placement Confirmation: ETT inserted through vocal cords under direct vision, positive ETCO2 and breath sounds checked- equal and bilateral Secured at: 21 cm Tube secured with: Tape Dental Injury: Teeth and Oropharynx as per pre-operative assessment

## 2022-07-04 NOTE — Discharge Instructions (Signed)
E[facility] Discharge instructions Please read the instructions outlined below and refer to this sheet in the next few weeks. These discharge instructions provide you with general information on caring for yourself after you leave the hospital. Your doctor may also give you specific instructions. While your treatment has been planned according to the most current medical practices available, unavoidable complications occasionally occur. If you have any problems or questions after discharge, please call your doctor. ACTIVITY You may resume your regular activity but move at a slower pace for the next 24 hours.  Take frequent rest periods for the next 24 hours.  Walking will help expel (get rid of) the air and reduce the bloated feeling in your abdomen.  No driving for 24 hours (because of the anesthesia (medicine) used during the test).  You may shower.  Do not sign any important legal documents or operate any machinery for 24 hours (because of the anesthesia used during the test).  NUTRITION Drink plenty of fluids.  You may resume your normal diet.  Begin with a light meal and progress to your normal diet.  Avoid alcoholic beverages for 24 hours or as instructed by your caregiver.  MEDICATIONS You may resume your normal medications unless your caregiver tells you otherwise.  WHAT YOU CAN EXPECT TODAY You may experience abdominal discomfort such as a feeling of fullness or "gas" pains.  FOLLOW-UP Your doctor will discuss the results of your test with you.  SEEK IMMEDIATE MEDICAL ATTENTION IF ANY OF THE FOLLOWING OCCUR: Excessive nausea (feeling sick to your stomach) and/or vomiting.  Severe abdominal pain and distention (swelling).  Trouble swallowing.  Temperature over 101 F (37.8 C).  Rectal bleeding or vomiting of blood.        meat removed from your esophagus today.  Your esophagus was not dilated.  Your exam was incomplete on purpose because of inflammation.  You may have a yeast  infection in your esophagus.  Testing is pending.    Continue all of your medications.  Continue omeprazole 40 mg daily.  Full liquid diet for the next few weeks until we can bring you back and look at your entire esophagus and stomach.    At patient request, I called Patty Leitzke at [phone] reviewed findings.

## 2022-07-04 NOTE — ED Notes (Signed)
ENDO at bedside to transport Pt.

## 2022-07-04 NOTE — ED Notes (Signed)
Respiratory at bedside.

## 2022-07-04 NOTE — ED Notes (Addendum)
Per EDP request, O2 decreased to 1L [place].  Will monitor and attempt to wean completely off O2.

## 2022-07-04 NOTE — ED Notes (Addendum)
Report given to Pamala Hurry, Day Surgery RN and Pt placed in a gown.

## 2022-07-04 NOTE — Anesthesia Preprocedure Evaluation (Addendum)
Anesthesia Evaluation  Patient identified by MRN, date of birth, ID band Patient awake    Reviewed: Allergy & Precautions, NPO status , Patient's Chart, lab work & pertinent test results  Airway Mallampati: II  TM Distance: >3 FB Neck ROM: Full    Dental  (+) Edentulous Upper, Edentulous Lower   Pulmonary shortness of breath, with exertion and Long-Term Oxygen Therapy, COPD,  COPD inhaler, Current Smoker,    + rhonchi        Cardiovascular Exercise Tolerance: Poor negative cardio ROS Normal cardiovascular exam Rhythm:Regular Rate:Normal     Neuro/Psych Seizures -,  PSYCHIATRIC DISORDERS Anxiety Bipolar Disorder CVA    GI/Hepatic PUD, GERD  Medicated,(+)     substance abuse  alcohol use and marijuana use,   Endo/Other  negative endocrine ROS  Renal/GU negative Renal ROS  negative genitourinary   Musculoskeletal  (+) Arthritis ,   Abdominal   Peds negative pediatric ROS (+)  Hematology  (+) Blood dyscrasia, anemia ,   Anesthesia Other Findings COMPARISON:  12/09/2020  FINDINGS: Streaky density in the lower lungs. There is a background of generalized interstitial coarsening. No visible effusion or pneumothorax. Normal heart size and mediastinal contours. Generalized osteopenia.  IMPRESSION: Atelectasis or bronchopneumonia at the lung bases.  COPD.   Electronically Signed   By: Jorje Guild M.D.   On: 07/04/2022 04:50  Reproductive/Obstetrics negative OB ROS                            Anesthesia Physical Anesthesia Plan  ASA: 3 and emergent  Anesthesia Plan: General   Post-op Pain Management: Minimal or no pain anticipated   Induction: Intravenous  PONV Risk Score and Plan: Ondansetron  Airway Management Planned: Oral ETT  Additional Equipment:   Intra-op Plan:   Post-operative Plan: Possible Post-op intubation/ventilation  Informed Consent: I have  reviewed the patients History and Physical, chart, labs and discussed the procedure including the risks, benefits and alternatives for the proposed anesthesia with the patient or authorized representative who has indicated his/her understanding and acceptance.     Dental advisory given  Plan Discussed with: CRNA and Surgeon  Anesthesia Plan Comments:         Anesthesia Quick Evaluation

## 2022-07-04 NOTE — Op Note (Signed)
[facility] Patient Name: [name] Procedure Date: 07/04/2022 11:21 AM MRN: [id number] Date of Birth: 08/24/1951 Attending MD: Norvel Richards , MD CSN: [id number] Age: 71 Admit Type: Outpatient Procedure:                Upper GI endoscopy/incomplete/removal of esophageal                            food bolus Indications:              Dysphagia Providers:                Norvel Richards, MD, Caprice Kluver, Crystal Page,                            Everardo Pacific Referring MD:             EDP Medicines:                Propofol per Anesthesia Complications:            No immediate complications. Estimated Blood Loss:     Estimated blood loss was minimal. Procedure:                Pre-Anesthesia Assessment:                           - Prior to the procedure, a History and Physical                            was performed, and patient medications and                            allergies were reviewed. The patient's tolerance of                            previous anesthesia was also reviewed. The risks                            and benefits of the procedure and the sedation                            options and risks were discussed with the patient.                            All questions were answered, and informed consent                            was obtained. Prior Anticoagulants: The patient has                            taken no previous anticoagulant or antiplatelet                            agents. ASA Grade Assessment: III - A patient with                            severe  systemic disease. After reviewing the risks                            and benefits, the patient was deemed in                            satisfactory condition to undergo the procedure.                           After obtaining informed consent, the endoscope was                            passed under direct vision. Throughout the                            procedure, the patient's blood  pressure, pulse, and                            oxygen saturations were monitored continuously. The                            [id number]-H190 ([id number]) scope was introduced through the                            mouth, and advanced to the GE junction.. The upper                            GI endoscopy was accomplished without difficulty.                            The patient tolerated the procedure well. Scope In: 11:39:10 AM Scope Out: 11:43:28 AM Total Procedure Duration: 0 hours 4 minutes 18 seconds  Findings:      Large meat bolus "ball valving" in the mid esophagus. Esophageal mucosa       diffusely coated with cream-colored raised plaque. I gently approximated       the tip of the gastric up against proximal end of the meat bolus gently       advanced it and it dropped down into this stomach. Exam was limited to       the GE junction and esophagus. Tubular esophagus appeared grossly patent       small hiatal hernia present. Esophageal mucosa brush for KOH prep. Exam       and complete per plan. Impression:               Esophageal food impaction?"status post disimpaction.                            Diffusely abnormal appearing esophagus with                            overlying plaques highly suspicious for Candida                            esophagitis status post KOH brushing to confirm. Moderate Sedation:  Moderate (conscious) sedation was personally administered by an       anesthesia professional. The following parameters were monitored: oxygen       saturation, heart rate, blood pressure, respiratory rate, EKG, adequacy       of pulmonary ventilation, and response to care. Recommendation:           - Patient has a contact number available for                            emergencies. The signs and symptoms of potential                            delayed complications were discussed with the                            patient. Return to normal activities tomorrow.                             Written discharge instructions were provided to the                            patient.                           - Full liquid diet. Continue PPI. Follow-up on KOH                            prep. Continue omeprazole 40 mg daily. Anticipate                            elective examination for a complete EGD in the next                            couple of weeks. Further recommendations to follow. Procedure Code(s):        --- Professional ---                           [phone], Esophagogastroduodenoscopy, flexible,                            transoral; diagnostic, including collection of                            specimen(s) by brushing or washing, when performed                            (separate procedure) Diagnosis Code(s):        --- Professional ---                           R13.10, Dysphagia, unspecified CPT copyright 2019 [employer] Medical [employer]. All rights reserved. The codes documented in this report are preliminary and upon coder review may  be revised to meet current compliance requirements. [name]. Sabrina Brum, MD Norvel Richards, MD 07/04/2022 11:56:29 AM This report has been signed electronically. Number of Addenda: 0

## 2022-07-04 NOTE — ED Notes (Signed)
Pt unable to keep anything down via po

## 2022-07-04 NOTE — Transfer of Care (Signed)
Immediate Anesthesia Transfer of Care Note  Patient: [name]  Procedure(s) Performed: ESOPHAGOGASTRODUODENOSCOPY (EGD) WITH PROPOFOL ESOPHAGEAL BRUSHING  Patient Location: PACU  Anesthesia Type:General  Level of Consciousness: awake, alert , oriented and patient cooperative  Airway & Oxygen Therapy: Patient Spontanous Breathing and Patient connected to face mask oxygen  Post-op Assessment: Report given to RN, Post -op Vital signs reviewed and stable and Patient moving all extremities  Post vital signs: Reviewed and stable  Last Vitals:  Vitals Value Taken Time  BP    Temp    Pulse    Resp    SpO2      Last Pain:  Vitals:   07/04/22 1130  TempSrc:   PainSc: 4          Complications: No notable events documented.

## 2022-07-04 NOTE — Progress Notes (Signed)
Patient seen briefly in ED, she reports ongoing dysphagia for months. Reportedly ate a piece of steak last night and felt that it stuck in her mid chest, with ongoing sensation of the same. She is able to swallow saliva at this time but nothing else. Has had EGD in the past for dysphagia. Denies any current blood thinners. Recommended we proceed with EGD for food impaction removal. Indications, risks and benefits of procedure discussed in detail with patient. Patient verbalized understanding and is in agreement to proceed with EGD at this time.

## 2022-07-04 NOTE — ED Notes (Signed)
Pt is aware of her black tee shirt, black tank top, gray sweat pants and her life alert necklace was all put in the Pt belonging bag on counter with her name tag on the pt belonging bag.

## 2022-07-04 NOTE — ED Notes (Signed)
Pt maintaining O2 sat of 94% on 1L [place].  Oxygen turned off.

## 2022-07-04 NOTE — ED Provider Notes (Addendum)
[facility] EMERGENCY DEPARTMENT Provider Note   CSN: [id number] Arrival date & time: 07/04/22  0272     History  Chief Complaint  Patient presents with   Foreign Body    [name] is a 71 y.o. female.  HPI     This is a 71 year old female who presents with concerns for food bolus.  Patient reports that she has had esophageal food bolus in the past.  She was eating steak last night when she felt like a piece got stuck.  She has not been able to drink anything since and states she has vomited it up.  She feels like it is still stuck.  Of note, she was noted to be 86 on room air.  She does not wear home oxygen.  Denies any recent fevers but has had a cough.  She is an ongoing smoker.  Home Medications Prior to Admission medications   Medication Sig Start Date End Date Taking? Authorizing Provider  albuterol (PROVENTIL HFA;VENTOLIN HFA) 108 (90 BASE) MCG/ACT inhaler Inhale 2 puffs into the lungs every 6 (six) hours as needed for wheezing or shortness of breath.     [provider]  albuterol (PROVENTIL) (2.5 MG/3ML) 0.083% nebulizer solution Take 2.5 mg by nebulization in the morning, at noon, and at bedtime.    [provider]  alendronate (FOSAMAX) 70 MG tablet Take 70 mg by mouth every Friday.  06/24/20   [provider]  ALPRAZolam Duanne Moron) 1 MG tablet Take 1 mg by mouth 4 (four) times daily as needed. 12/11/20   [provider]  atorvastatin (LIPITOR) 20 MG tablet Take 20 mg by mouth every evening.  01/08/19   [provider]  calcium carbonate (OSCAL) 1500 (600 Ca) MG TABS tablet Take 600 mg of elemental calcium by mouth daily with breakfast.    [provider]  carboxymethylcellulose (REFRESH PLUS) 0.5 % SOLN Place 1 drop into both eyes 3 (three) times daily as needed (dry/irritated eyes.).    [provider]  Cholecalciferol (VITAMIN D) 50 MCG (2000 UT) tablet Take 2,000 Units by mouth daily.     [provider]  clonazePAM (KLONOPIN) 1 MG tablet Take 1 tablet (1 mg total) by mouth 3 (three) times daily. 04/26/19   Barton Dubois, MD  dicyclomine (BENTYL) 10 MG capsule Take 10 mg by mouth daily.     [provider]  gabapentin (NEURONTIN) 300 MG capsule TAKE (1) CAPSULE BY MOUTH THREE TIMES DAILY 04/16/22   Derek Jack, MD  omeprazole (PRILOSEC) 40 MG capsule Take 40 mg by mouth daily.  10/04/16   [provider]  ondansetron (ZOFRAN) 4 MG tablet Take 1 tablet (4 mg total) by mouth every 6 (six) hours as needed for nausea or vomiting. 5/36/64   Delora Fuel, MD  oxyCODONE-acetaminophen (PERCOCET) 10-325 MG tablet Take 1 tablet by mouth every 4 (four) hours. 10/12/21   [provider]  phenytoin (DILANTIN) 100 MG ER capsule Take 400 mg by mouth at bedtime.    [provider]  primidone (MYSOLINE) 50 MG tablet Take 50 mg by mouth at bedtime. 10/07/20   [provider]  tamoxifen (NOLVADEX) 20 MG tablet Take 1 tablet (20 mg total) by mouth daily. 08/09/21   Derek Jack, MD  tiZANidine (ZANAFLEX) 4 MG tablet Take 4 mg by mouth every 8 (eight) hours as needed for muscle spasms.  08/22/18   [provider]  traZODone (DESYREL) 100 MG tablet Take 100 mg by  mouth at bedtime.  04/09/19   [provider]      Allergies    Varenicline, Codeine, Influenza virus vaccine, Lithium, and Haemophilus b polysaccharide vaccine    Review of Systems   Review of Systems  Constitutional:  Negative for fever.  Respiratory:  Positive for cough. Negative for shortness of breath.   All other systems reviewed and are negative.   Physical Exam Updated Vital Signs BP (!) 144/71   Pulse 84   Resp 18   SpO2 94%  Physical Exam Vitals and nursing note reviewed.  Constitutional:      Appearance: She is well-developed.     Comments: Chronically ill-appearing  HENT:     Head: Normocephalic and atraumatic.     Mouth/Throat:      Comments: Tolerating secretions Eyes:     Pupils: Pupils are equal, round, and reactive to light.  Cardiovascular:     Rate and Rhythm: Normal rate and regular rhythm.  Pulmonary:     Effort: Pulmonary effort is normal. No respiratory distress.     Breath sounds: Wheezing present.     Comments: No respiratory distress, nasal cannula in place Abdominal:     General: Bowel sounds are normal.     Palpations: Abdomen is soft.     Tenderness: There is no abdominal tenderness.  Musculoskeletal:     Cervical back: Neck supple.  Skin:    General: Skin is warm and dry.  Neurological:     Mental Status: She is alert and oriented to person, place, and time.  Psychiatric:        Mood and Affect: Mood normal.     ED Results / Procedures / Treatments   Labs (all labs ordered are listed, but only abnormal results are displayed) Labs Reviewed  CBC WITH DIFFERENTIAL/PLATELET - Abnormal; Notable for the following components:      Result Value   RBC 3.80 (*)    All other components within normal limits  BASIC METABOLIC PANEL - Abnormal; Notable for the following components:   Glucose, Bld 110 (*)    Calcium 7.8 (*)    Anion gap 3 (*)    All other components within normal limits    EKG None  Radiology DG Chest Portable 1 View  Result Date: 07/04/2022 CLINICAL DATA:  Hypoxia EXAM: PORTABLE CHEST 1 VIEW COMPARISON:  12/09/2020 FINDINGS: Streaky density in the lower lungs. There is a background of generalized interstitial coarsening. No visible effusion or pneumothorax. Normal heart size and mediastinal contours. Generalized osteopenia. IMPRESSION: Atelectasis or bronchopneumonia at the lung bases. COPD. Electronically Signed   By: Jorje Guild M.D.   On: 07/04/2022 04:50    Procedures Procedures    Medications Ordered in ED Medications  ipratropium-albuterol (DUONEB) 0.5-2.5 (3) MG/3ML nebulizer solution 3 mL (3 mLs Nebulization Given 07/04/22 0457)  glucagon (human recombinant)  (GLUCAGEN) injection 1 mg (1 mg Intravenous Given 07/04/22 0505)  dexamethasone (DECADRON) injection 10 mg (10 mg Intravenous Given 07/04/22 9476)    ED Course/ Medical Decision Making/ A&P Clinical Course as of 07/04/22 0642  Wed Jul 04, 2022  0545 Failed glucagon and a carbonated beverage.  We will consult with gastroenterology. [CH]  [phone] Spoke with Dr. Jenetta Downer.  Patient to be evaluated first thing this morning for endoscopy. [CH]    Clinical Course User Index [CH] Deon Duer, Barbette Hair, MD  Medical Decision Making Amount and/or Complexity of Data Reviewed Labs: ordered. Radiology: ordered.  Risk Prescription drug management.   This patient presents to the ED for concern of food impaction, this involves an extensive number of treatment options, and is a complaint that carries with it a high risk of complications and morbidity.  I considered the following differential and admission for this acute, potentially life threatening condition.  The differential diagnosis includes food impaction, globus sensation, esophagitis  MDM:    This is a 71 year old female who presents with foreign body sensation.  Had steak last night.  She has a good story for food impaction.  Additionally, she is noted to be mildly hypoxic.  She is an ongoing smoker with chronic COPD.  She is on home oxygen.  She is wheezing on exam.  She was given a dose of Decadron and albuterol and anticipation that she will need to be optimized for likely endoscopy.  She was given a dose of glucagon and a carbonated beverage but was unable to keep these down and vomited them back up.  Labs obtained and reviewed and are largely reassuring.  Chest x-ray shows atelectasis versus pneumonia.  Clinically she does not have any fever or leukocytosis.  Would favor COPD exacerbation.  See clinical course above.  She will need endoscopy.  If she cannot be weaned off oxygen, she may need admission.  (Labs, imaging,  consults)  Labs: I Ordered, and personally interpreted labs.  The pertinent results include: CBC, BMP  Imaging Studies ordered: I ordered imaging studies including chest x-ray I independently visualized and interpreted imaging. I agree with the radiologist interpretation  Additional history obtained from chart review.  External records from outside source obtained and reviewed including prior evaluations  Cardiac Monitoring: The patient was maintained on a cardiac monitor.  I personally viewed and interpreted the cardiac monitored which showed an underlying rhythm of: Sinus rhythm  Reevaluation: After the interventions noted above, I reevaluated the patient and found that they have :stayed the same  Social Determinants of Health: Smoker  Disposition: Pending endoscopy  Co morbidities that complicate the patient evaluation  Past Medical History:  Diagnosis Date   Alcohol abuse, in remission    Anxiety    Arthritis    Bipolar disorder ([place])    Colitis    COPD (chronic obstructive pulmonary disease) ([place])    occasional home O2   Gastric ulcer    Hemorrhoids    IBS (irritable bowel syndrome)    Seizures ([place])    last seizure was about a year ago   Stroke [facility])    right sided weakness-slight     Medicines Meds ordered this encounter  Medications   ipratropium-albuterol (DUONEB) 0.5-2.5 (3) MG/3ML nebulizer solution 3 mL   glucagon (human recombinant) (GLUCAGEN) injection 1 mg   dexamethasone (DECADRON) injection 10 mg    I have reviewed the patients home medicines and have made adjustments as needed  Problem List / ED Course: Problem List Items Addressed This Visit   None Visit Diagnoses     Food impaction of esophagus, initial encounter    -  Primary   COPD exacerbation ([place])       Relevant Medications   ipratropium-albuterol (DUONEB) 0.5-2.5 (3) MG/3ML nebulizer solution 3 mL (Completed)   dexamethasone (DECADRON) injection 10 mg (Completed)                    Final Clinical Impression(s) / ED Diagnoses Final diagnoses:  Food  impaction of esophagus, initial encounter  COPD exacerbation [facility])    Rx / DC Orders ED Discharge Orders     None         Ezariah Nace, Barbette Hair, MD 07/04/22 3254    Merryl Hacker, MD 07/04/22 [phone]

## 2022-07-04 NOTE — ED Provider Notes (Signed)
Care of patient assumed from Dr. Dina Rich.  This patient presents for a food bolus.  GI has been consulted and plan is for endoscopy this morning.  She is a chronic smoker and was found to have SPO2 of 86% on room air.  She received DuoNeb and Decadron and has since had improvement to 90%.   She has no respiratory complaints.  She is likely chronically hypoxic. Physical Exam  BP (!) 115/57   Pulse 78   Resp (!) 23   SpO2 94%   Physical Exam Vitals and nursing note reviewed.  Constitutional:      General: She is not in acute distress.    Appearance: Normal appearance. She is well-developed. She is not toxic-appearing or diaphoretic.  HENT:     Head: Normocephalic and atraumatic.     Right Ear: External ear normal.     Left Ear: External ear normal.     Mouth/Throat:     Mouth: Mucous membranes are moist.     Pharynx: Oropharynx is clear.  Eyes:     Extraocular Movements: Extraocular movements intact.  Cardiovascular:     Rate and Rhythm: Normal rate and regular rhythm.  Pulmonary:     Effort: Pulmonary effort is normal. No respiratory distress.  Abdominal:     General: There is no distension.     Palpations: Abdomen is soft.  Musculoskeletal:        General: No swelling.     Cervical back: Normal range of motion.  Skin:    General: Skin is warm and dry.     Coloration: Skin is not jaundiced or pale.  Neurological:     General: No focal deficit present.     Mental Status: She is alert and oriented to person, place, and time.  Psychiatric:        Mood and Affect: Mood normal.        Behavior: Behavior normal.     Procedures  Procedures  ED Course / MDM   Clinical Course as of 07/04/22 0733  Wed Jul 04, 2022  0545 Failed glucagon and a carbonated beverage.  We will consult with gastroenterology. [CH]  [phone] Spoke with Dr. Jenetta Downer.  Patient to be evaluated first thing this morning for endoscopy. [CH]    Clinical Course User Index [CH] Horton, Barbette Hair, MD   Medical  Decision Making Amount and/or Complexity of Data Reviewed Labs: ordered. Radiology: ordered.  Risk Prescription drug management.   On assessment, patient resting comfortably.  SPO2 remained in the low 90s on room air.  She was taken to endoscopy in stable condition.       Godfrey Pick, MD 07/04/22 [phone]

## 2022-07-04 NOTE — ED Triage Notes (Signed)
Pt arrived from home via [facility] w c/o foreign body lodges in throat. Pt stated that at dinner last night she choked on a piece of steak and it feels like it is still there.

## 2022-07-04 NOTE — Anesthesia Postprocedure Evaluation (Signed)
Anesthesia Post Note  Patient: [name]  Procedure(s) Performed: ESOPHAGOGASTRODUODENOSCOPY (EGD) WITH PROPOFOL ESOPHAGEAL BRUSHING  Patient location during evaluation: Phase II Anesthesia Type: General Level of consciousness: awake and alert and oriented Pain management: pain level controlled Vital Signs Assessment: post-procedure vital signs reviewed and stable Respiratory status: spontaneous breathing, nonlabored ventilation and respiratory function stable Cardiovascular status: blood pressure returned to baseline and stable Postop Assessment: no apparent nausea or vomiting Anesthetic complications: no   No notable events documented.   Last Vitals:  Vitals:   07/04/22 1200 07/04/22 1212  BP: (!) 132/58 (!) 149/73  Pulse: 82 84  Resp: (!) 23 17  Temp:  36.6 C  SpO2: 100% 95%    Last Pain:  Vitals:   07/04/22 1151  TempSrc:   PainSc: Asleep                 Risha Barretta C Cordaryl Decelles

## 2022-07-04 NOTE — H&P (Signed)
$'@LOGO'H$ @   Primary Care Physician:  Redmond School, MD Primary Gastroenterologist:  Dr. Gala Romney  Pre-Procedure History & Physical: HPI:  [name] is a 71 y.o. female here for  for urgent management of apparent meat bolus impaction.  He was eating steak last night felt a piece of it hang has been having difficulty swallowing anything else ever since came to the ED   This morning.  We were contacted.  Patient endorses chronic GERD and esophageal dysphagia for many months.  Distant history of EGD with esophageal dilation.  Cannot find records at this time.  Reflux been has been controlled on omeprazole 20 mg daily.   Reported distant history of peptic ulcer disease.  Past Medical History:  Diagnosis Date   Alcohol abuse, in remission    Anxiety    Arthritis    Bipolar disorder ([place])    Colitis    COPD (chronic obstructive pulmonary disease) (HCC)    occasional home O2   Gastric ulcer    Hemorrhoids    IBS (irritable bowel syndrome)    Seizures ([place])    last seizure was about a year ago   Stroke [facility])    right sided weakness-slight    Past Surgical History:  Procedure Laterality Date   ABDOMINAL HYSTERECTOMY     CHOLECYSTECTOMY     COMPRESSION HIP SCREW Right 04/25/2019   Procedure: COMPRESSION HIP;  Surgeon: Carole Civil, MD;  Location: [facility];  Service: Orthopedics;  Laterality: Right;  1030am   EYE SURGERY Bilateral    cataract removal   MASTECTOMY, PARTIAL Right 07/20/2020   Procedure: MASTECTOMY PARTIAL;  Surgeon: Aviva Signs, MD;  Location: [facility];  Service: General;  Laterality: Right;   PARTIAL MASTECTOMY WITH AXILLARY SENTINEL LYMPH NODE BIOPSY Right 07/06/2020   Procedure: RIGHT PARTIAL MASTECTOMY WITH AXILLARY SENTINEL LYMPH NODE BIOPSY;  Surgeon: Aviva Signs, MD;  Location: [facility];  Service: General;  Laterality: Right;    Prior to Admission medications   Medication Sig Start Date End Date Taking? Authorizing Provider  albuterol (PROVENTIL HFA;VENTOLIN  HFA) 108 (90 BASE) MCG/ACT inhaler Inhale 2 puffs into the lungs every 6 (six) hours as needed for wheezing or shortness of breath.    Yes [provider]  albuterol (PROVENTIL) (2.5 MG/3ML) 0.083% nebulizer solution Take 2.5 mg by nebulization in the morning, at noon, and at bedtime.   Yes [provider]  atorvastatin (LIPITOR) 20 MG tablet Take 20 mg by mouth every evening.  01/08/19  Yes [provider]  calcium carbonate (OSCAL) 1500 (600 Ca) MG TABS tablet Take 600 mg of elemental calcium by mouth daily with breakfast.   Yes [provider]  carboxymethylcellulose (REFRESH PLUS) 0.5 % SOLN Place 1 drop into both eyes 3 (three) times daily as needed (dry/irritated eyes.).   Yes [provider]  clonazePAM (KLONOPIN) 1 MG tablet Take 1 tablet (1 mg total) by mouth 3 (three) times daily. Patient taking differently: Take 1 mg by mouth 4 (four) times daily as needed for anxiety. 04/26/19  Yes Barton Dubois, MD  dicyclomine (BENTYL) 10 MG capsule Take 10 mg by mouth daily.    Yes [provider]  gabapentin (NEURONTIN) 300 MG capsule TAKE (1) CAPSULE BY MOUTH THREE TIMES DAILY Patient taking differently: Take 300 mg by mouth 3 (three) times daily. 04/16/22  Yes Derek Jack, MD  omeprazole (PRILOSEC) 40 MG capsule Take 40 mg by mouth daily.  10/04/16  Yes [provider]  ondansetron (  ZOFRAN) 4 MG tablet Take 1 tablet (4 mg total) by mouth every 6 (six) hours as needed for nausea or vomiting. 3/53/61  Yes Delora Fuel, MD  phenytoin (DILANTIN) 100 MG ER capsule Take 400 mg by mouth at bedtime.   Yes [provider]  promethazine (PHENERGAN) 25 MG tablet Take 25 mg by mouth 4 (four) times daily as needed for nausea or vomiting. 06/03/22  Yes [provider]  STIOLTO RESPIMAT 2.5-2.5 MCG/ACT AERS Inhale 2 puffs into the lungs daily. 06/25/22  Yes [provider]  tamoxifen (NOLVADEX) 20 MG tablet Take 1 tablet  (20 mg total) by mouth daily. 08/09/21  Yes Derek Jack, MD  tiZANidine (ZANAFLEX) 4 MG tablet Take 4 mg by mouth every 8 (eight) hours as needed for muscle spasms.  08/22/18  Yes [provider]  traZODone (DESYREL) 100 MG tablet Take 150 mg by mouth at bedtime. 04/09/19  Yes [provider]    Allergies as of 07/04/2022 - Review Complete 07/04/2022  Allergen Reaction Noted   Varenicline Hives 07/16/2017   Codeine Nausea And Vomiting 05/21/2017   Influenza virus vaccine Nausea And Vomiting 08/02/2013   Lithium Nausea And Vomiting 10/26/2011   Haemophilus b polysaccharide vaccine Nausea And Vomiting 08/02/2013    Family History  Problem Relation Age of Onset   Dementia Father     Social History   Socioeconomic History   Marital status: Married    Spouse name: Not on file   Number of children: Not on file   Years of education: Not on file   Highest education level: Not on file  Occupational History   Occupation: retired  Tobacco Use   Smoking status: Every Day    Packs/day: 0.50    Years: 32.00    Total pack years: 16.00    Types: Cigarettes   Smokeless tobacco: Never  Vaping Use   Vaping Use: Never used  Substance and Sexual Activity   Alcohol use: No    Comment: h/o heavy use, quit in 1995   Drug use: Yes    Types: Marijuana    Comment: sometimes   Sexual activity: Not Currently  Other Topics Concern   Not on file  Social History Narrative   Not on file   Social Determinants of Health   Financial Resource Strain: Low Risk  (08/11/2020)   Overall Financial Resource Strain (CARDIA)    Difficulty of Paying Living Expenses: Not hard at all  Food Insecurity: No Food Insecurity (08/11/2020)   Hunger Vital Sign    Worried About Running Out of Food in the Last Year: Never true    Ran Out of Food in the Last Year: Never true  Transportation Needs: No Transportation Needs (08/11/2020)   PRAPARE - Hydrologist  (Medical): No    Lack of Transportation (Non-Medical): No  Physical Activity: Insufficiently Active (08/11/2020)   Exercise Vital Sign    Days of Exercise per Week: 7 days    Minutes of Exercise per Session: 10 min  Stress: Stress Concern Present (08/11/2020)   [place]    Feeling of Stress : Rather much  Social Connections: Moderately Isolated (08/11/2020)   Social Connection and Isolation Panel [NHANES]    Frequency of Communication with Friends and Family: Three times a week    Frequency of Social Gatherings with Friends and Family: Never    Attends Religious Services: Never    Active Member  of Clubs or Organizations: No    Attends Archivist Meetings: Never    Marital Status: Married  Human resources officer Violence: Not At Risk (08/11/2020)   Humiliation, Afraid, Rape, and Kick questionnaire    Fear of Current or Ex-Partner: No    Emotionally Abused: No    Physically Abused: No    Sexually Abused: No    Review of Systems: See HPI, otherwise negative ROS  Physical Exam: BP (!) 143/73   Pulse 83   Temp 98.3 F (36.8 C) (Oral)   Resp 18   SpO2 94%  General:     Elderly frail appearingpleasant and cooperative in NAD.  Does appear uncomfortable. No significant cervical adenopathy. Lungs:    Distant breath sounds.   No wheezes, crackles, or rhonchi. No acute distress. Heart:  Regular rate and rhythm; no murmurs, clicks, rubs,  or gallops. Abdomen:   Nondistended.  Positive bowel sounds soft nontender Pulses:  Normal pulses noted. Extremities:  Without clubbing or edema.  Impression/Plan:   71 year old lady with longstanding GERD and recurrent esophageal dysphagia presenting acutely with clinical history  compelling for esophageal meat bolus impaction.   Significant comorbidity includes COPD.  Discussed with anesthesia at length.    Recommendations:   Urgent EGD with disimpaction.  Explained the  patient in all likelihood, we would not be able to dilate in the same setting.  More likely, she would need to return for  an elective EGD with esophageal dilation as feasible/appropriate.  Per anesthesia, endotracheal intubation for this procedure will be done   I have offered the patient a EGD with disimpaction per plan.    Further recommendations to follow.  The risks, benefits, limitations, alternatives and imponderables have been reviewed with the patient. Potential for esophageal dilation, biopsy, etc. have also been reviewed.  Questions have been answered. All parties agreeable.      Notice: This dictation was prepared with Dragon dictation along with smaller phrase technology. Any transcriptional errors that result from this process are unintentional and may not be corrected upon review.

## 2022-07-05 DIAGNOSIS — H04123 Dry eye syndrome of bilateral lacrimal glands: Secondary | ICD-10-CM | POA: Diagnosis not present

## 2022-07-06 ENCOUNTER — Other Ambulatory Visit: Payer: Self-pay

## 2022-07-06 MED ORDER — FLUCONAZOLE 100 MG PO TABS: ORAL_TABLET | ORAL | 0 refills | Status: DC

## 2022-07-11 ENCOUNTER — Encounter (HOSPITAL_COMMUNITY): Payer: Self-pay | Admitting: Internal Medicine

## 2022-07-16 ENCOUNTER — Telehealth: Payer: Self-pay

## 2022-07-16 NOTE — Telephone Encounter (Signed)
Please schedule patient within the next couple weeks.

## 2022-07-16 NOTE — Telephone Encounter (Signed)
-----   Message from Daneil Dolin, MD sent at 07/15/2022 12:27 PM EDT ----- This lady should have a follow-up appointment with [place] in the coming weeks to set up an elective EGD with possible esoph. dilation

## 2022-08-08 ENCOUNTER — Ambulatory Visit: Payer: Medicare Other | Admitting: Internal Medicine

## 2022-08-15 ENCOUNTER — Telehealth: Payer: Self-pay | Admitting: *Deleted

## 2022-08-15 ENCOUNTER — Encounter: Payer: Self-pay | Admitting: Internal Medicine

## 2022-08-15 ENCOUNTER — Ambulatory Visit (INDEPENDENT_AMBULATORY_CARE_PROVIDER_SITE_OTHER): Payer: Medicare Other | Admitting: Internal Medicine

## 2022-08-15 VITALS — BP 125/67 | HR 83 | Temp 98.6°F | Ht 63.5 in | Wt 128.4 lb

## 2022-08-15 DIAGNOSIS — K219 Gastro-esophageal reflux disease without esophagitis: Secondary | ICD-10-CM | POA: Diagnosis not present

## 2022-08-15 DIAGNOSIS — R1319 Other dysphagia: Secondary | ICD-10-CM | POA: Diagnosis not present

## 2022-08-15 DIAGNOSIS — Z86010 Personal history of colonic polyps: Secondary | ICD-10-CM

## 2022-08-15 NOTE — Telephone Encounter (Signed)
LMTRC  Need to schedule pt for TCS/EGD/ED ASA 3 with Dr.Rourk

## 2022-08-15 NOTE — H&P (View-Only) (Signed)
Primary Care Physician:  Redmond School, MD Primary Gastroenterologist:  Dr. Gala Romney  Pre-Procedure History & Physical: HPI:  [name] is a 71 y.o. female here for follow-up of GERD and esophageal dysphagia.  Seen earlier last month for a food impaction impaction was removed endoscopically.  Grossly, tubular esophagus appeared patent.  She did have esophageal plaques that were brushed and positive for Candida.  She took a 3-week course of Diflucan.  She is returning to set up an elective EGD.  She is a dentulous; has a partial upper plate.  She has dysphagia to a number of firm texture foods.  She has to subsist on a soft diet otherwise; she reports things can get "hung up".  Typical reflux symptoms well controlled on omeprazole 40 mg daily. 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  She recalls a terrible experience and felt that she was not sedated (look back revealed she received Versed, Demerol and Phenergan).  History of alcohol use disorder in the past  - now sober.  Also takes benzodiazepines. She is not currently having any lower GI tract symptoms although she does cite occasional constipation.  Past Medical History:  Diagnosis Date   Alcohol abuse, in remission    Anxiety    Arthritis    Bipolar disorder ([place])    Colitis    COPD (chronic obstructive pulmonary disease) (HCC)    occasional home O2   Gastric ulcer    Hemorrhoids    IBS (irritable bowel syndrome)    Seizures ([place])    last seizure was about a year ago   Stroke [facility])    right sided weakness-slight    Past Surgical History:  Procedure Laterality Date   ABDOMINAL HYSTERECTOMY     CHOLECYSTECTOMY     COMPRESSION HIP SCREW Right 04/25/2019   Procedure: COMPRESSION HIP;  Surgeon: Carole Civil, MD;  Location: [facility];  Service: Orthopedics;  Laterality: Right;  1030am   ESOPHAGEAL BRUSHING  07/04/2022   Procedure: ESOPHAGEAL BRUSHING;  Surgeon: Daneil Dolin, MD;  Location: [facility];  Service: Endoscopy;;   ESOPHAGOGASTRODUODENOSCOPY (EGD) WITH PROPOFOL N/A 07/04/2022   Procedure: ESOPHAGOGASTRODUODENOSCOPY (EGD) WITH PROPOFOL;  Surgeon: Daneil Dolin, MD;  Location: [facility];  Service: Endoscopy;  Laterality: N/A;  ESOPHAGOGASTRODUODENOSCOPY WITH FOOD IMPACTION REMOVAL   EYE SURGERY Bilateral    cataract removal   MASTECTOMY, PARTIAL Right 07/20/2020   Procedure: MASTECTOMY PARTIAL;  Surgeon: Aviva Signs, MD;  Location: [facility];  Service: General;  Laterality: Right;   PARTIAL MASTECTOMY WITH AXILLARY SENTINEL LYMPH NODE BIOPSY Right 07/06/2020   Procedure: RIGHT PARTIAL MASTECTOMY WITH AXILLARY SENTINEL LYMPH NODE BIOPSY;  Surgeon: Aviva Signs, MD;  Location: [facility];  Service: General;  Laterality: Right;    Prior to Admission medications   Medication Sig Start Date End Date Taking? Authorizing Provider  albuterol (PROVENTIL) (2.5 MG/3ML) 0.083% nebulizer solution Take 2.5 mg by nebulization in the morning, at noon, and at bedtime.   Yes [provider]  atorvastatin (LIPITOR) 20 MG tablet Take 20 mg by mouth every evening.  01/08/19  Yes [provider]  calcium carbonate (OSCAL) 1500 (600 Ca) MG TABS tablet Take 600 mg of elemental calcium by mouth daily with breakfast.   Yes [provider]  carboxymethylcellulose (REFRESH PLUS) 0.5 % SOLN Place 1 drop into both eyes 3 (three) times daily as needed (dry/irritated eyes.).   Yes [provider]  clonazePAM (KLONOPIN) 1 MG tablet Take 1 tablet (1 mg total) by mouth 3 (three) times daily. Patient taking differently: Take 1 mg by mouth 4 (four) times daily as needed for anxiety. 04/26/19  Yes Barton Dubois, MD  dicyclomine (BENTYL) 10 MG capsule Take 10 mg by mouth daily.    Yes [provider]  gabapentin (NEURONTIN) 300 MG capsule TAKE (1) CAPSULE BY MOUTH THREE TIMES DAILY Patient taking differently: Take 300 mg by mouth 3 (three)  times daily. 04/16/22  Yes Derek Jack, MD  omeprazole (PRILOSEC) 40 MG capsule Take 40 mg by mouth daily.  10/04/16  Yes [provider]  phenytoin (DILANTIN) 100 MG ER capsule Take 400 mg by mouth at bedtime.   Yes [provider]  STIOLTO RESPIMAT 2.5-2.5 MCG/ACT AERS Inhale 2 puffs into the lungs daily. 06/25/22  Yes [provider]  tamoxifen (NOLVADEX) 20 MG tablet Take 1 tablet (20 mg total) by mouth daily. 08/09/21  Yes Derek Jack, MD  tiZANidine (ZANAFLEX) 4 MG tablet Take 4 mg by mouth every 8 (eight) hours as needed for muscle spasms.  08/22/18  Yes [provider]  traZODone (DESYREL) 100 MG tablet Take 150 mg by mouth at bedtime. 04/09/19  Yes [provider]  albuterol (PROVENTIL HFA;VENTOLIN HFA) 108 (90 BASE) MCG/ACT inhaler Inhale 2 puffs into the lungs every 6 (six) hours as needed for wheezing or shortness of breath.  Patient not taking: Reported on 08/15/2022    [provider]  fluconazole (DIFLUCAN) 100 MG tablet Take 2 tablets ('200mg'$ ) the first day and then 1 tablet once a day for the next 21 days. Patient not taking: Reported on 08/15/2022 07/06/22   Daneil Dolin, MD  ondansetron (ZOFRAN) 4 MG tablet Take 1 tablet (4 mg total) by mouth every 6 (six) hours as needed for nausea or vomiting. Patient not taking: Reported on 85/46/2703 5/00/93   Delora Fuel, MD  promethazine (PHENERGAN) 25 MG tablet Take 25 mg by mouth 4 (four) times daily as needed for nausea or vomiting. Patient not taking: Reported on 08/15/2022 06/03/22   [provider]    Allergies as of 08/15/2022 - Review Complete 08/15/2022  Allergen Reaction Noted   Varenicline Hives 07/16/2017   Codeine Nausea And Vomiting 05/21/2017   Influenza virus vaccine Nausea And Vomiting 08/02/2013   Lithium Nausea And Vomiting 10/26/2011   Haemophilus b polysaccharide vaccine Nausea And Vomiting 08/02/2013    Family History  Problem Relation  Age of Onset   Dementia Father     Social History   Socioeconomic History   Marital status: Married    Spouse name: Not on file   Number of children: Not on file   Years of education: Not on file   Highest education level: Not on file  Occupational History   Occupation: retired  Tobacco Use   Smoking status: Every Day    Packs/day: 0.50    Years: 32.00    Total pack years: 16.00    Types: Cigarettes   Smokeless tobacco: Never  Vaping Use   Vaping Use: Never used  Substance and Sexual Activity   Alcohol use: No    Comment: h/o heavy use, quit in 1995   Drug use: Yes    Types: Marijuana    Comment: sometimes   Sexual activity: Not Currently  Other Topics Concern   Not on file  Social History Narrative   Not on file   Social Determinants of Health  Financial Resource Strain: Low Risk  (08/11/2020)   Overall Financial Resource Strain (CARDIA)    Difficulty of Paying Living Expenses: Not hard at all  Food Insecurity: No Food Insecurity (08/11/2020)   Hunger Vital Sign    Worried About Running Out of Food in the Last Year: Never true    Ran Out of Food in the Last Year: Never true  Transportation Needs: No Transportation Needs (08/11/2020)   PRAPARE - Hydrologist (Medical): No    Lack of Transportation (Non-Medical): No  Physical Activity: Insufficiently Active (08/11/2020)   Exercise Vital Sign    Days of Exercise per Week: 7 days    Minutes of Exercise per Session: 10 min  Stress: Stress Concern Present (08/11/2020)   [place]    Feeling of Stress : Rather much  Social Connections: Moderately Isolated (08/11/2020)   Social Connection and Isolation Panel [NHANES]    Frequency of Communication with Friends and Family: Three times a week    Frequency of Social Gatherings with Friends and Family: Never    Attends Religious Services: Never    Marine scientist or  Organizations: No    Attends Archivist Meetings: Never    Marital Status: Married  Human resources officer Violence: Not At Risk (08/11/2020)   Humiliation, Afraid, Rape, and Kick questionnaire    Fear of Current or Ex-Partner: No    Emotionally Abused: No    Physically Abused: No    Sexually Abused: No    Review of Systems: See HPI, otherwise negative ROS  Physical Exam: BP 125/67 (BP Location: Right Arm, Patient Position: Sitting, Cuff Size: Normal)   Pulse 83   Temp 98.6 F (37 C)   Ht 5' 3.5" (1.613 m)   Wt 128 lb 6.4 oz (58.2 kg)   SpO2 94%   BMI 22.39 kg/m  General:   Alert, somewhat chronically ill-appearing pleasant and cooperative in NAD Mouth:  No deformity or lesions. No teeth. Neck:  Supple; no masses or thyromegaly. No significant cervical adenopathy. Lungs:  Clear throughout to auscultation.   No wheezes, crackles, or rhonchi. No acute distress. Heart:  Regular rate and rhythm; no murmurs, clicks, rubs,  or gallops. Abdomen: Non-distended, normal bowel sounds.  Soft and nontender without appreciable mass or hepatosplenomegaly.  Pulses:  Normal pulses noted. Extremities:  Without clubbing or edema.  Impression/Plan: 71 year old lady with longstanding GERD and esophageal dysphagia seen recently with a food impaction and concomitant Candida esophagitis.  She needs to have a thorough evaluation of her upper GI tract in an elective setting.  Although she did not appear to have any significant narrowing lesion in her esophagus, this needs to be done.  The fact that she is a dentulous is likely contributing to her esophageal dysphagia via swallowing boluses which are simply too large for her esophagus to accommodate.  In addition, this lady has history of multiple colonic adenomas removed from the ascending colon 12 years ago.  She had a bad experience with conscious sedation.  She has not had a follow-up colonoscopy.  Recommendations:  As discussed, we will repeat  the upper endoscopy electively to further evaluate esophageal dysphagia;  we will perform dilation or stretch of your esophagus as appropriate.  ASA 3  At the same time, we will perform a surveillance colonoscopy given  history of colon polyps-ASA 3.  Propofol to be utilized. (We will request the pediatric colonoscope) The  risks, benefits, limitations, imponderables and alternatives regarding both EGD and colonoscopy have been reviewed with the patient. Questions have been answered. All parties agreeable.   Continue omeprazole 40 mg daily  Further recommendations to follow   Notice: This dictation was prepared with Dragon dictation along with smaller phrase technology. Any transcriptional errors that result from this process are unintentional and may not be corrected upon review.

## 2022-08-15 NOTE — Patient Instructions (Signed)
It was nice to see you again today!  As discussed, we will repeat the upper endoscopy electively to further evaluate esophageal dysphagia;  we will perform dilation or stretch of your esophagus as appropriate.  ASA 3  At the same time, we will perform her surveillance colonoscopy given your history of colon polyps-ASA 3  (We will request the pediatric colonoscope)  Continue omeprazole 40 mg daily  Further recommendations to follow.

## 2022-08-15 NOTE — Progress Notes (Signed)
Primary Care Physician:  Redmond School, MD Primary Gastroenterologist:  Dr. Gala Romney  Pre-Procedure History & Physical: HPI:  [name] is a 71 y.o. female here for follow-up of GERD and esophageal dysphagia.  Seen earlier last month for a food impaction impaction was removed endoscopically.  Grossly, tubular esophagus appeared patent.  She did have esophageal plaques that were brushed and positive for Candida.  She took a 3-week course of Diflucan.  She is returning to set up an elective EGD.  She is a dentulous; has a partial upper plate.  She has dysphagia to a number of firm texture foods.  She has to subsist on a soft diet otherwise; she reports things can get "hung up".  Typical reflux symptoms well controlled on omeprazole 40 mg daily. 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  She recalls a terrible experience and felt that she was not sedated (look back revealed she received Versed, Demerol and Phenergan).  History of alcohol use disorder in the past  - now sober.  Also takes benzodiazepines. She is not currently having any lower GI tract symptoms although she does cite occasional constipation.  Past Medical History:  Diagnosis Date   Alcohol abuse, in remission    Anxiety    Arthritis    Bipolar disorder ([place])    Colitis    COPD (chronic obstructive pulmonary disease) (HCC)    occasional home O2   Gastric ulcer    Hemorrhoids    IBS (irritable bowel syndrome)    Seizures ([place])    last seizure was about a year ago   Stroke [facility])    right sided weakness-slight    Past Surgical History:  Procedure Laterality Date   ABDOMINAL HYSTERECTOMY     CHOLECYSTECTOMY     COMPRESSION HIP SCREW Right 04/25/2019   Procedure: COMPRESSION HIP;  Surgeon: Carole Civil, MD;  Location: [facility];  Service: Orthopedics;  Laterality: Right;  1030am   ESOPHAGEAL BRUSHING  07/04/2022   Procedure: ESOPHAGEAL BRUSHING;  Surgeon: Daneil Dolin, MD;  Location: [facility];  Service: Endoscopy;;   ESOPHAGOGASTRODUODENOSCOPY (EGD) WITH PROPOFOL N/A 07/04/2022   Procedure: ESOPHAGOGASTRODUODENOSCOPY (EGD) WITH PROPOFOL;  Surgeon: Daneil Dolin, MD;  Location: [facility];  Service: Endoscopy;  Laterality: N/A;  ESOPHAGOGASTRODUODENOSCOPY WITH FOOD IMPACTION REMOVAL   EYE SURGERY Bilateral    cataract removal   MASTECTOMY, PARTIAL Right 07/20/2020   Procedure: MASTECTOMY PARTIAL;  Surgeon: Aviva Signs, MD;  Location: [facility];  Service: General;  Laterality: Right;   PARTIAL MASTECTOMY WITH AXILLARY SENTINEL LYMPH NODE BIOPSY Right 07/06/2020   Procedure: RIGHT PARTIAL MASTECTOMY WITH AXILLARY SENTINEL LYMPH NODE BIOPSY;  Surgeon: Aviva Signs, MD;  Location: [facility];  Service: General;  Laterality: Right;    Prior to Admission medications   Medication Sig Start Date End Date Taking? Authorizing Provider  albuterol (PROVENTIL) (2.5 MG/3ML) 0.083% nebulizer solution Take 2.5 mg by nebulization in the morning, at noon, and at bedtime.   Yes [provider]  atorvastatin (LIPITOR) 20 MG tablet Take 20 mg by mouth every evening.  01/08/19  Yes [provider]  calcium carbonate (OSCAL) 1500 (600 Ca) MG TABS tablet Take 600 mg of elemental calcium by mouth daily with breakfast.   Yes [provider]  carboxymethylcellulose (REFRESH PLUS) 0.5 % SOLN Place 1 drop into both eyes 3 (three) times daily as needed (dry/irritated eyes.).   Yes [provider]  clonazePAM (KLONOPIN) 1 MG tablet Take 1 tablet (1 mg total) by mouth 3 (three) times daily. Patient taking differently: Take 1 mg by mouth 4 (four) times daily as needed for anxiety. 04/26/19  Yes Barton Dubois, MD  dicyclomine (BENTYL) 10 MG capsule Take 10 mg by mouth daily.    Yes [provider]  gabapentin (NEURONTIN) 300 MG capsule TAKE (1) CAPSULE BY MOUTH THREE TIMES DAILY Patient taking differently: Take 300 mg by mouth 3 (three)  times daily. 04/16/22  Yes Derek Jack, MD  omeprazole (PRILOSEC) 40 MG capsule Take 40 mg by mouth daily.  10/04/16  Yes [provider]  phenytoin (DILANTIN) 100 MG ER capsule Take 400 mg by mouth at bedtime.   Yes [provider]  STIOLTO RESPIMAT 2.5-2.5 MCG/ACT AERS Inhale 2 puffs into the lungs daily. 06/25/22  Yes [provider]  tamoxifen (NOLVADEX) 20 MG tablet Take 1 tablet (20 mg total) by mouth daily. 08/09/21  Yes Derek Jack, MD  tiZANidine (ZANAFLEX) 4 MG tablet Take 4 mg by mouth every 8 (eight) hours as needed for muscle spasms.  08/22/18  Yes [provider]  traZODone (DESYREL) 100 MG tablet Take 150 mg by mouth at bedtime. 04/09/19  Yes [provider]  albuterol (PROVENTIL HFA;VENTOLIN HFA) 108 (90 BASE) MCG/ACT inhaler Inhale 2 puffs into the lungs every 6 (six) hours as needed for wheezing or shortness of breath.  Patient not taking: Reported on 08/15/2022    [provider]  fluconazole (DIFLUCAN) 100 MG tablet Take 2 tablets ('200mg'$ ) the first day and then 1 tablet once a day for the next 21 days. Patient not taking: Reported on 08/15/2022 07/06/22   Daneil Dolin, MD  ondansetron (ZOFRAN) 4 MG tablet Take 1 tablet (4 mg total) by mouth every 6 (six) hours as needed for nausea or vomiting. Patient not taking: Reported on 70/10/7492 4/96/75   Delora Fuel, MD  promethazine (PHENERGAN) 25 MG tablet Take 25 mg by mouth 4 (four) times daily as needed for nausea or vomiting. Patient not taking: Reported on 08/15/2022 06/03/22   [provider]    Allergies as of 08/15/2022 - Review Complete 08/15/2022  Allergen Reaction Noted   Varenicline Hives 07/16/2017   Codeine Nausea And Vomiting 05/21/2017   Influenza virus vaccine Nausea And Vomiting 08/02/2013   Lithium Nausea And Vomiting 10/26/2011   Haemophilus b polysaccharide vaccine Nausea And Vomiting 08/02/2013    Family History  Problem Relation  Age of Onset   Dementia Father     Social History   Socioeconomic History   Marital status: Married    Spouse name: Not on file   Number of children: Not on file   Years of education: Not on file   Highest education level: Not on file  Occupational History   Occupation: retired  Tobacco Use   Smoking status: Every Day    Packs/day: 0.50    Years: 32.00    Total pack years: 16.00    Types: Cigarettes   Smokeless tobacco: Never  Vaping Use   Vaping Use: Never used  Substance and Sexual Activity   Alcohol use: No    Comment: h/o heavy use, quit in 1995   Drug use: Yes    Types: Marijuana    Comment: sometimes   Sexual activity: Not Currently  Other Topics Concern   Not on file  Social History Narrative   Not on file   Social Determinants of Health  Financial Resource Strain: Low Risk  (08/11/2020)   Overall Financial Resource Strain (CARDIA)    Difficulty of Paying Living Expenses: Not hard at all  Food Insecurity: No Food Insecurity (08/11/2020)   Hunger Vital Sign    Worried About Running Out of Food in the Last Year: Never true    Ran Out of Food in the Last Year: Never true  Transportation Needs: No Transportation Needs (08/11/2020)   PRAPARE - Hydrologist (Medical): No    Lack of Transportation (Non-Medical): No  Physical Activity: Insufficiently Active (08/11/2020)   Exercise Vital Sign    Days of Exercise per Week: 7 days    Minutes of Exercise per Session: 10 min  Stress: Stress Concern Present (08/11/2020)   [place]    Feeling of Stress : Rather much  Social Connections: Moderately Isolated (08/11/2020)   Social Connection and Isolation Panel [NHANES]    Frequency of Communication with Friends and Family: Three times a week    Frequency of Social Gatherings with Friends and Family: Never    Attends Religious Services: Never    Marine scientist or  Organizations: No    Attends Archivist Meetings: Never    Marital Status: Married  Human resources officer Violence: Not At Risk (08/11/2020)   Humiliation, Afraid, Rape, and Kick questionnaire    Fear of Current or Ex-Partner: No    Emotionally Abused: No    Physically Abused: No    Sexually Abused: No    Review of Systems: See HPI, otherwise negative ROS  Physical Exam: BP 125/67 (BP Location: Right Arm, Patient Position: Sitting, Cuff Size: Normal)   Pulse 83   Temp 98.6 F (37 C)   Ht 5' 3.5" (1.613 m)   Wt 128 lb 6.4 oz (58.2 kg)   SpO2 94%   BMI 22.39 kg/m  General:   Alert, somewhat chronically ill-appearing pleasant and cooperative in NAD Mouth:  No deformity or lesions. No teeth. Neck:  Supple; no masses or thyromegaly. No significant cervical adenopathy. Lungs:  Clear throughout to auscultation.   No wheezes, crackles, or rhonchi. No acute distress. Heart:  Regular rate and rhythm; no murmurs, clicks, rubs,  or gallops. Abdomen: Non-distended, normal bowel sounds.  Soft and nontender without appreciable mass or hepatosplenomegaly.  Pulses:  Normal pulses noted. Extremities:  Without clubbing or edema.  Impression/Plan: 71 year old lady with longstanding GERD and esophageal dysphagia seen recently with a food impaction and concomitant Candida esophagitis.  She needs to have a thorough evaluation of her upper GI tract in an elective setting.  Although she did not appear to have any significant narrowing lesion in her esophagus, this needs to be done.  The fact that she is a dentulous is likely contributing to her esophageal dysphagia via swallowing boluses which are simply too large for her esophagus to accommodate.  In addition, this lady has history of multiple colonic adenomas removed from the ascending colon 12 years ago.  She had a bad experience with conscious sedation.  She has not had a follow-up colonoscopy.  Recommendations:  As discussed, we will repeat  the upper endoscopy electively to further evaluate esophageal dysphagia;  we will perform dilation or stretch of your esophagus as appropriate.  ASA 3  At the same time, we will perform a surveillance colonoscopy given  history of colon polyps-ASA 3.  Propofol to be utilized. (We will request the pediatric colonoscope) The  risks, benefits, limitations, imponderables and alternatives regarding both EGD and colonoscopy have been reviewed with the patient. Questions have been answered. All parties agreeable.   Continue omeprazole 40 mg daily  Further recommendations to follow   Notice: This dictation was prepared with Dragon dictation along with smaller phrase technology. Any transcriptional errors that result from this process are unintentional and may not be corrected upon review.

## 2022-08-16 ENCOUNTER — Encounter: Payer: Self-pay | Admitting: *Deleted

## 2022-08-16 ENCOUNTER — Telehealth: Payer: Self-pay | Admitting: Internal Medicine

## 2022-08-16 ENCOUNTER — Other Ambulatory Visit (HOSPITAL_COMMUNITY): Payer: Self-pay | Admitting: Hematology

## 2022-08-16 MED ORDER — PEG 3350-KCL-NA BICARB-NACL 420 G PO SOLR: 4000.0000 mL | Freq: Once | ORAL | 0 refills | Status: AC

## 2022-08-16 NOTE — Telephone Encounter (Signed)
LMOVM to disregard message that was on her husbands vm because I have already spoken to her this morning

## 2022-08-16 NOTE — Telephone Encounter (Signed)
Pt has been scheduled for 09/27/22 at 9:00 am. Instructions and pre-op date mailed to pt. Prep sent to the pharmacy  [id number] Authorization #: [id number] DOS: 09/27/22-10/28/22

## 2022-08-16 NOTE — Telephone Encounter (Signed)
Pt said she was returning a call that someone had called her husband and was calling us back. [phone]

## 2022-08-27 ENCOUNTER — Encounter: Payer: Self-pay | Admitting: *Deleted

## 2022-08-27 ENCOUNTER — Other Ambulatory Visit (HOSPITAL_COMMUNITY): Payer: Self-pay | Admitting: Hematology

## 2022-08-28 ENCOUNTER — Telehealth: Payer: Self-pay | Admitting: *Deleted

## 2022-08-28 NOTE — Telephone Encounter (Signed)
Pt left voicemail wanting a return call about upcoming procedures.  [place]

## 2022-09-10 DIAGNOSIS — J449 Chronic obstructive pulmonary disease, unspecified: Secondary | ICD-10-CM | POA: Diagnosis not present

## 2022-09-10 DIAGNOSIS — Z6821 Body mass index (BMI) 21.0-21.9, adult: Secondary | ICD-10-CM | POA: Diagnosis not present

## 2022-09-10 DIAGNOSIS — F5101 Primary insomnia: Secondary | ICD-10-CM | POA: Diagnosis not present

## 2022-09-10 DIAGNOSIS — I700 Atherosclerosis of aorta: Secondary | ICD-10-CM | POA: Diagnosis not present

## 2022-09-10 DIAGNOSIS — C50211 Malignant neoplasm of upper-inner quadrant of right female breast: Secondary | ICD-10-CM | POA: Diagnosis not present

## 2022-09-10 DIAGNOSIS — G894 Chronic pain syndrome: Secondary | ICD-10-CM | POA: Diagnosis not present

## 2022-09-10 NOTE — Patient Instructions (Signed)
[name]  09/10/2022     '@PREFPERIOPPHARMACY'$ @   Your procedure is scheduled on  09/13/2022.   Report to [facility] at  0600 A.M.   Call this number if you have problems the morning of surgery:  [phone]  If you experience any cold or flu symptoms such as cough, fever, chills, shortness of breath, etc. between now and your scheduled surgery, please notify us at the above number.   Remember:  Follow the diet and prep instructions given to you by the office.      Use your nebulizer and your inhaler before you come and bring your rescue inhaler with you.     Take these medicines the morning of surgery with A SIP OF WATER        clonazepam, gabapentin, hydroxyzine, omeprazole, zanaflex.     Do not wear jewelry, make-up or nail polish.  Do not wear lotions, powders, or perfumes, or deodorant.  Do not shave 48 hours prior to surgery.  Men may shave face and neck.  Do not bring valuables to the hospital.  [facility] is not responsible for any belongings or valuables.  Contacts, dentures or bridgework may not be worn into surgery.  Leave your suitcase in the car.  After surgery it may be brought to your room.  For patients admitted to the hospital, discharge time will be determined by your treatment team.  Patients discharged the day of surgery will not be allowed to drive home and must have someone with them for 24 hours.    Special instructions:   DO NOT smoke tobacco or vape for 24 hours before your procedure.  Please read over the following fact sheets that you were given. Anesthesia Post-op Instructions and Care and Recovery After Surgery      Upper Endoscopy, Adult, Care After After the procedure, it is common to have a sore throat. It is also common to have: Mild stomach pain or discomfort. Bloating. Nausea. Follow these instructions at home: The instructions below may help you care for yourself at home. Your health care provider may  give you more instructions. If you have questions, ask your health care provider. If you were given a sedative during the procedure, it can affect you for several hours. Do not drive or operate machinery until your health care provider says that it is safe. If you will be going home right after the procedure, plan to have a responsible adult: Take you home from the hospital or clinic. You will not be allowed to drive. Care for you for the time you are told. Follow instructions from your health care provider about what you may eat and drink. Return to your normal activities as told by your health care provider. Ask your health care provider what activities are safe for you. Take over-the-counter and prescription medicines only as told by your health care provider. Contact a health care provider if you: Have a sore throat that lasts longer than one day. Have trouble swallowing. Have a fever. Get help right away if you: Vomit blood or your vomit looks like coffee grounds. Have bloody, black, or tarry stools. Have a very bad sore throat or you cannot swallow. Have difficulty breathing or very bad pain in your chest or abdomen. These symptoms may be an emergency. Get help right away. Call 911. Do not wait to see if the symptoms will go away. Do not drive yourself to the hospital.  Summary After the procedure, it is common to have a sore throat, mild stomach discomfort, bloating, and nausea. If you were given a sedative during the procedure, it can affect you for several hours. Do not drive until your health care provider says that it is safe. Follow instructions from your health care provider about what you may eat and drink. Return to your normal activities as told by your health care provider. This information is not intended to replace advice given to you by your health care provider. Make sure you discuss any questions you have with your health care provider. Document Revised: 01/24/2022  Document Reviewed: 01/24/2022 Elsevier Patient Education  [place]. Esophageal Dilatation Esophageal dilatation, also called esophageal dilation, is a procedure to widen or open a blocked or narrowed part of the esophagus. The esophagus is the part of the body that moves food and liquid from the mouth to the stomach. You may need this procedure if: You have a buildup of scar tissue in your esophagus that makes it difficult, painful, or impossible to swallow. This can be caused by gastroesophageal reflux disease (GERD). You have cancer of the esophagus. There is a problem with how food moves through your esophagus. In some cases, you may need this procedure repeated at a later time to dilate the esophagus gradually. Tell a health care provider about: Any allergies you have. All medicines you are taking, including vitamins, herbs, eye drops, creams, and over-the-counter medicines. Any problems you or family members have had with anesthetic medicines. Any blood disorders you have. Any surgeries you have had. Any medical conditions you have. Any antibiotic medicines you are required to take before dental procedures. Whether you are pregnant or may be pregnant. What are the risks? Generally, this is a safe procedure. However, problems may occur, including: Bleeding due to a tear in the lining of the esophagus. A hole, or perforation, in the esophagus. What happens before the procedure? Ask your health care provider about: Changing or stopping your regular medicines. This is especially important if you are taking diabetes medicines or blood thinners. Taking medicines such as aspirin and ibuprofen. These medicines can thin your blood. Do not take these medicines unless your health care provider tells you to take them. Taking over-the-counter medicines, vitamins, herbs, and supplements. Follow instructions from your health care provider about eating or drinking restrictions. Plan to have  a responsible adult take you home from the hospital or clinic. Plan to have a responsible adult care for you for the time you are told after you leave the hospital or clinic. This is important. What happens during the procedure? You may be given a medicine to help you relax (sedative). A numbing medicine may be sprayed into the back of your throat, or you may gargle the medicine. Your health care provider may perform the dilatation using various surgical instruments, such as: Simple dilators. This instrument is carefully placed in the esophagus to stretch it. Guided wire bougies. This involves using an endoscope to insert a wire into the esophagus. A dilator is passed over this wire to enlarge the esophagus. Then the wire is removed. Balloon dilators. An endoscope with a small balloon is inserted into the esophagus. The balloon is inflated to stretch the esophagus and open it up. The procedure may vary among health care providers and hospitals. What can I expect after the procedure? Your blood pressure, heart rate, breathing rate, and blood oxygen level will be monitored until you leave the hospital or  clinic. Your throat may feel slightly sore and numb. This will get better over time. You will not be allowed to eat or drink until your throat is no longer numb. When you are able to drink, urinate, and sit on the edge of the bed without nausea or dizziness, you may be able to return home. Follow these instructions at home: Take over-the-counter and prescription medicines only as told by your health care provider. If you were given a sedative during the procedure, it can affect you for several hours. Do not drive or operate machinery until your health care provider says that it is safe. Plan to have a responsible adult care for you for the time you are told. This is important. Follow instructions from your health care provider about any eating or drinking restrictions. Do not use any products that  contain nicotine or tobacco, such as cigarettes, e-cigarettes, and chewing tobacco. If you need help quitting, ask your health care provider. Keep all follow-up visits. This is important. Contact a health care provider if: You have a fever. You have pain that is not relieved by medicine. Get help right away if: You have chest pain. You have trouble breathing. You have trouble swallowing. You vomit blood. You have black, tarry, or bloody stools. These symptoms may represent a serious problem that is an emergency. Do not wait to see if the symptoms will go away. Get medical help right away. Call your local emergency services (911 in the [place] Do not drive yourself to the hospital. Summary Esophageal dilatation, also called esophageal dilation, is a procedure to widen or open a blocked or narrowed part of the esophagus. Plan to have a responsible adult take you home from the hospital or clinic. For this procedure, a numbing medicine may be sprayed into the back of your throat, or you may gargle the medicine. Do not drive or operate machinery until your health care provider says that it is safe. This information is not intended to replace advice given to you by your health care provider. Make sure you discuss any questions you have with your health care provider. Document Revised: 03/02/2020 Document Reviewed: 03/02/2020 Elsevier Patient Education  [place].   Colonoscopy, Adult, Care After The following information offers guidance on how to care for yourself after your procedure. Your health care provider may also give you more specific instructions. If you have problems or questions, contact your health care provider. What can I expect after the procedure? After the procedure, it is common to have: A small amount of blood in your stool for 24 hours after the procedure. Some gas. Mild cramping or bloating of your abdomen. Follow these instructions at home: Eating and  drinking  Drink enough fluid to keep your urine pale yellow. Follow instructions from your health care provider about eating or drinking restrictions. Resume your normal diet as told by your health care provider. Avoid heavy or fried foods that are hard to digest. Activity Rest as told by your health care provider. Avoid sitting for a long time without moving. Get up to take short walks every 1-2 hours. This is important to improve blood flow and breathing. Ask for help if you feel weak or unsteady. Return to your normal activities as told by your health care provider. Ask your health care provider what activities are safe for you. Managing cramping and bloating  Try walking around when you have cramps or feel bloated. If directed, apply heat to your abdomen as told  by your health care provider. Use the heat source that your health care provider recommends, such as a moist heat pack or a heating pad. Place a towel between your skin and the heat source. Leave the heat on for 20-30 minutes. Remove the heat if your skin turns bright red. This is especially important if you are unable to feel pain, heat, or cold. You have a greater risk of getting burned. General instructions If you were given a sedative during the procedure, it can affect you for several hours. Do not drive or operate machinery until your health care provider says that it is safe. For the first 24 hours after the procedure: Do not sign important documents. Do not drink alcohol. Do your regular daily activities at a slower pace than normal. Eat soft foods that are easy to digest. Take over-the-counter and prescription medicines only as told by your health care provider. Keep all follow-up visits. This is important. Contact a health care provider if: You have blood in your stool 2-3 days after the procedure. Get help right away if: You have more than a small spotting of blood in your stool. You have large blood clots in your  stool. You have swelling of your abdomen. You have nausea or vomiting. You have a fever. You have increasing pain in your abdomen that is not relieved with medicine. These symptoms may be an emergency. Get help right away. Call 911. Do not wait to see if the symptoms will go away. Do not drive yourself to the hospital. Summary After the procedure, it is common to have a small amount of blood in your stool. You may also have mild cramping and bloating of your abdomen. If you were given a sedative during the procedure, it can affect you for several hours. Do not drive or operate machinery until your health care provider says that it is safe. Get help right away if you have a lot of blood in your stool, nausea or vomiting, a fever, or increased pain in your abdomen. This information is not intended to replace advice given to you by your health care provider. Make sure you discuss any questions you have with your health care provider. Document Revised: 06/07/2021 Document Reviewed: 06/07/2021 Elsevier Patient Education  [place] After The following information offers guidance on how to care for yourself after your procedure. Your health care provider may also give you more specific instructions. If you have problems or questions, contact your health care provider. What can I expect after the procedure? After the procedure, it is common to have: Tiredness. Little or no memory about what happened during or after the procedure. Impaired judgment when it comes to making decisions. Nausea or vomiting. Some trouble with balance. Follow these instructions at home: For the time period you were told by your health care provider:  Rest. Do not participate in activities where you could fall or become injured. Do not drive or use machinery. Do not drink alcohol. Do not take sleeping pills or medicines that cause drowsiness. Do not make important decisions or  sign legal documents. Do not take care of children on your own. Medicines Take over-the-counter and prescription medicines only as told by your health care provider. If you were prescribed antibiotics, take them as told by your health care provider. Do not stop using the antibiotic even if you start to feel better. Eating and drinking Follow instructions from your health care provider about what you may  eat and drink. Drink enough fluid to keep your urine pale yellow. If you vomit: Drink clear fluids slowly and in small amounts as you are able. Clear fluids include water, ice chips, low-calorie sports drinks, and fruit juice that has water added to it (diluted fruit juice). Eat light and bland foods in small amounts as you are able. These foods include bananas, applesauce, rice, lean meats, toast, and crackers. General instructions  Have a responsible adult stay with you for the time you are told. It is important to have someone help care for you until you are awake and alert. If you have sleep apnea, surgery and some medicines can increase your risk for breathing problems. Follow instructions from your health care provider about wearing your sleep device: When you are sleeping. This includes during daytime naps. While taking prescription pain medicines, sleeping medicines, or medicines that make you drowsy. Do not use any products that contain nicotine or tobacco. These products include cigarettes, chewing tobacco, and vaping devices, such as e-cigarettes. If you need help quitting, ask your health care provider. Contact a health care provider if: You feel nauseous or vomit every time you eat or drink. You feel light-headed. You are still sleepy or having trouble with balance after 24 hours. You get a rash. You have a fever. You have redness or swelling around the IV site. Get help right away if: You have trouble breathing. You have new confusion after you get home. These symptoms may be  an emergency. Get help right away. Call 911. Do not wait to see if the symptoms will go away. Do not drive yourself to the hospital. This information is not intended to replace advice given to you by your health care provider. Make sure you discuss any questions you have with your health care provider. Document Revised: 03/12/2022 Document Reviewed: 03/12/2022 Elsevier Patient Education  [place].

## 2022-09-10 NOTE — Telephone Encounter (Signed)
Pt informed of procedure date and time. Verbalized understanding

## 2022-09-11 ENCOUNTER — Encounter (HOSPITAL_COMMUNITY): Payer: Self-pay

## 2022-09-11 ENCOUNTER — Encounter (HOSPITAL_COMMUNITY)
Admission: RE | Admit: 2022-09-11 | Discharge: 2022-09-11 | Disposition: A | Discharge status: Home / Self Care | Payer: Medicare Other | Source: Ambulatory Visit | Attending: Internal Medicine | Admitting: Internal Medicine

## 2022-09-11 VITALS — BP 116/58 | HR 101 | Temp 97.7°F | Resp 18 | Ht 63.5 in | Wt 128.3 lb

## 2022-09-11 DIAGNOSIS — F17200 Nicotine dependence, unspecified, uncomplicated: Secondary | ICD-10-CM

## 2022-09-11 HISTORY — DX: Gastro-esophageal reflux disease without esophagitis: K21.9

## 2022-09-13 ENCOUNTER — Ambulatory Visit (HOSPITAL_COMMUNITY): Payer: Medicare Other | Admitting: Anesthesiology

## 2022-09-13 ENCOUNTER — Ambulatory Visit (HOSPITAL_BASED_OUTPATIENT_CLINIC_OR_DEPARTMENT_OTHER): Payer: Medicare Other | Admitting: Anesthesiology

## 2022-09-13 ENCOUNTER — Ambulatory Visit (HOSPITAL_COMMUNITY)
Admission: RE | Admit: 2022-09-13 | Discharge: 2022-09-13 | Disposition: A | Discharge status: Home / Self Care | Payer: Medicare Other | Attending: Internal Medicine | Admitting: Internal Medicine

## 2022-09-13 ENCOUNTER — Encounter (HOSPITAL_COMMUNITY): Payer: Self-pay

## 2022-09-13 ENCOUNTER — Encounter (HOSPITAL_COMMUNITY)
Admission: RE | Disposition: A | Discharge status: Home / Self Care | Payer: Self-pay | Source: Home / Self Care | Attending: Internal Medicine

## 2022-09-13 DIAGNOSIS — K2950 Unspecified chronic gastritis without bleeding: Secondary | ICD-10-CM | POA: Insufficient documentation

## 2022-09-13 DIAGNOSIS — F419 Anxiety disorder, unspecified: Secondary | ICD-10-CM | POA: Diagnosis not present

## 2022-09-13 DIAGNOSIS — R1310 Dysphagia, unspecified: Secondary | ICD-10-CM | POA: Diagnosis not present

## 2022-09-13 DIAGNOSIS — Z86010 Personal history of colonic polyps: Secondary | ICD-10-CM

## 2022-09-13 DIAGNOSIS — Z79899 Other long term (current) drug therapy: Secondary | ICD-10-CM | POA: Diagnosis not present

## 2022-09-13 DIAGNOSIS — Z8711 Personal history of peptic ulcer disease: Secondary | ICD-10-CM | POA: Insufficient documentation

## 2022-09-13 DIAGNOSIS — Z8719 Personal history of other diseases of the digestive system: Secondary | ICD-10-CM | POA: Insufficient documentation

## 2022-09-13 DIAGNOSIS — R1314 Dysphagia, pharyngoesophageal phase: Secondary | ICD-10-CM | POA: Diagnosis not present

## 2022-09-13 DIAGNOSIS — K449 Diaphragmatic hernia without obstruction or gangrene: Secondary | ICD-10-CM | POA: Insufficient documentation

## 2022-09-13 DIAGNOSIS — F17210 Nicotine dependence, cigarettes, uncomplicated: Secondary | ICD-10-CM | POA: Insufficient documentation

## 2022-09-13 DIAGNOSIS — K5730 Diverticulosis of large intestine without perforation or abscess without bleeding: Secondary | ICD-10-CM | POA: Diagnosis not present

## 2022-09-13 DIAGNOSIS — K219 Gastro-esophageal reflux disease without esophagitis: Secondary | ICD-10-CM | POA: Insufficient documentation

## 2022-09-13 DIAGNOSIS — J449 Chronic obstructive pulmonary disease, unspecified: Secondary | ICD-10-CM | POA: Diagnosis not present

## 2022-09-13 DIAGNOSIS — Z1211 Encounter for screening for malignant neoplasm of colon: Secondary | ICD-10-CM | POA: Insufficient documentation

## 2022-09-13 DIAGNOSIS — Z09 Encounter for follow-up examination after completed treatment for conditions other than malignant neoplasm: Secondary | ICD-10-CM | POA: Diagnosis not present

## 2022-09-13 DIAGNOSIS — K635 Polyp of colon: Secondary | ICD-10-CM | POA: Diagnosis not present

## 2022-09-13 DIAGNOSIS — D122 Benign neoplasm of ascending colon: Secondary | ICD-10-CM | POA: Diagnosis not present

## 2022-09-13 DIAGNOSIS — K648 Other hemorrhoids: Secondary | ICD-10-CM | POA: Diagnosis not present

## 2022-09-13 DIAGNOSIS — I69951 Hemiplegia and hemiparesis following unspecified cerebrovascular disease affecting right dominant side: Secondary | ICD-10-CM | POA: Insufficient documentation

## 2022-09-13 DIAGNOSIS — K319 Disease of stomach and duodenum, unspecified: Secondary | ICD-10-CM | POA: Insufficient documentation

## 2022-09-13 DIAGNOSIS — K2970 Gastritis, unspecified, without bleeding: Secondary | ICD-10-CM

## 2022-09-13 HISTORY — PX: ESOPHAGOGASTRODUODENOSCOPY (EGD) WITH PROPOFOL: SHX5813

## 2022-09-13 HISTORY — PX: POLYPECTOMY: SHX5525

## 2022-09-13 HISTORY — PX: BIOPSY: SHX5522

## 2022-09-13 HISTORY — PX: COLONOSCOPY WITH PROPOFOL: SHX5780

## 2022-09-13 SURGERY — COLONOSCOPY WITH PROPOFOL
Anesthesia: General

## 2022-09-13 MED ORDER — LACTATED RINGERS IV SOLN: INTRAVENOUS | Status: DC | PRN

## 2022-09-13 MED ORDER — STERILE WATER FOR IRRIGATION IR SOLN
Status: DC | PRN
  Administered 2022-09-13: 120 mL

## 2022-09-13 MED ORDER — LIDOCAINE HCL (CARDIAC) PF 100 MG/5ML IV SOSY
PREFILLED_SYRINGE | INTRAVENOUS | Status: DC | PRN
  Administered 2022-09-13: 100 mg via INTRAVENOUS

## 2022-09-13 MED ORDER — PROPOFOL 10 MG/ML IV BOLUS
INTRAVENOUS | Status: DC | PRN
  Administered 2022-09-13: 100 mg via INTRAVENOUS

## 2022-09-13 MED ORDER — LACTATED RINGERS IV SOLN: INTRAVENOUS | Status: DC

## 2022-09-13 MED ORDER — PROPOFOL 500 MG/50ML IV EMUL
INTRAVENOUS | Status: DC | PRN
  Administered 2022-09-13: 200 ug/kg/min via INTRAVENOUS

## 2022-09-13 MED ORDER — PHENYLEPHRINE HCL (PRESSORS) 10 MG/ML IV SOLN
INTRAVENOUS | Status: DC | PRN
  Administered 2022-09-13 (×2): 160 ug via INTRAVENOUS

## 2022-09-13 NOTE — Interval H&P Note (Signed)
History and Physical Interval Note:  09/13/2022 7:34 AM  [name]  has presented today for surgery, with the diagnosis of dysphagia,GERD,history of polyps.  The various methods of treatment have been discussed with the patient and family. After consideration of risks, benefits and other options for treatment, the patient has consented to  Procedure(s) with comments: COLONOSCOPY WITH PROPOFOL (N/A) - 11:15 am ESOPHAGOGASTRODUODENOSCOPY (EGD) WITH PROPOFOL (N/A) MALONEY DILATION (N/A) as a surgical intervention.  The patient's history has been reviewed, patient examined, no change in status, stable for surgery.  I have reviewed the patient's chart and labs.  Questions were answered to the patient's satisfaction.     Eloise Harman

## 2022-09-13 NOTE — Anesthesia Postprocedure Evaluation (Signed)
Anesthesia Post Note  Patient: [name]  Procedure(s) Performed: COLONOSCOPY WITH PROPOFOL ESOPHAGOGASTRODUODENOSCOPY (EGD) WITH PROPOFOL BIOPSY POLYPECTOMY  Patient location during evaluation: Phase II Anesthesia Type: General Level of consciousness: awake and alert Pain management: pain level controlled Vital Signs Assessment: post-procedure vital signs reviewed and stable Respiratory status: spontaneous breathing, nonlabored ventilation, respiratory function stable and patient connected to nasal cannula oxygen Cardiovascular status: blood pressure returned to baseline and stable Postop Assessment: no apparent nausea or vomiting Anesthetic complications: no   No notable events documented.   Last Vitals:  Vitals:   09/13/22 0655 09/13/22 0811  BP: 134/69 127/63  Pulse: 86 76  Resp: 20 20  Temp: 36.7 C (!) 36.4 C  SpO2: 95% 95%    Last Pain:  Vitals:   09/13/22 0811  TempSrc: Oral  PainSc: 0-No pain                 Demonica Farrey Clyde Canterbury

## 2022-09-13 NOTE — Op Note (Addendum)
[facility] Patient Name: [name] Procedure Date: 09/13/2022 7:16 AM MRN: [id number] Date of Birth: 08-13-1951 Attending MD: Elon Alas. Abbey Chatters , [place], [id number] CSN: [id number] Age: 71 Admit Type: Outpatient Procedure:                Upper GI endoscopy Indications:              Dysphagia, Heartburn Providers:                Elon Alas. Abbey Chatters, DO, Lambert Mody, Dereck Leep, Technician Referring MD:              Medicines:                See the Anesthesia note for documentation of the                            administered medications Complications:            No immediate complications. Estimated Blood Loss:     Estimated blood loss was minimal. Procedure:                Pre-Anesthesia Assessment:                           - The anesthesia plan was to use monitored                            anesthesia care (MAC).                           After obtaining informed consent, the endoscope was                            passed under direct vision. Throughout the                            procedure, the patient's blood pressure, pulse, and                            oxygen saturations were monitored continuously. The                            [id number]-H190 ([id number]) scope was introduced through the                            mouth, and advanced to the second part of duodenum.                            The upper GI endoscopy was accomplished without                            difficulty. The patient tolerated the procedure                            well. Scope In: 7:42:26  AM Scope Out: 7:45:57 AM Total Procedure Duration: 0 hours 3 minutes 31 seconds  Findings:      The Z-line was regular and was found 40 cm from the incisors.      There is no endoscopic evidence of esophagitis in the entire esophagus.       Candidiasis has healed nicely. Esophagus widely patent. Elected not to       dilate as patient tells me she is no longer having  symptoms of dysphagia.      Patchy mild inflammation characterized by erythema and nodularity was       found in the gastric body. Biopsies were taken with a cold forceps for       Helicobacter pylori testing.      The duodenal bulb, first portion of the duodenum and second portion of       the duodenum were normal.      A small hiatal hernia was present. Impression:               - Z-line regular, 40 cm from the incisors.                           - Gastritis. Biopsied.                           - Normal duodenal bulb, first portion of the                            duodenum and second portion of the duodenum.                           - Small hiatal hernia. Moderate Sedation:      Per Anesthesia Care Recommendation:           - Patient has a contact number available for                            emergencies. The signs and symptoms of potential                            delayed complications were discussed with the                            patient. Return to normal activities tomorrow.                            Written discharge instructions were provided to the                            patient.                           - Continue present medications.                           - Resume previous diet.                           - Await pathology results.                           -  Use Prilosec (omeprazole) 40 mg PO daily.                           - Return to GI clinic in 3 months. Procedure Code(s):        --- Professional ---                           [phone], Esophagogastroduodenoscopy, flexible,                            transoral; with biopsy, single or multiple Diagnosis Code(s):        --- Professional ---                           K29.70, Gastritis, unspecified, without bleeding                           K44.9, Diaphragmatic hernia without obstruction or                            gangrene                           R13.10, Dysphagia, unspecified                            R12, Heartburn CPT copyright 2022 American Medical Association. All rights reserved. The codes documented in this report are preliminary and upon coder review may  be revised to meet current compliance requirements. Elon Alas. Abbey Chatters, DO [place] Abbey Chatters, DO 09/13/2022 7:49:48 AM This report has been signed electronically. Number of Addenda: 0

## 2022-09-13 NOTE — Anesthesia Preprocedure Evaluation (Signed)
Anesthesia Evaluation  Patient identified by MRN, date of birth, ID band Patient awake    Reviewed: Allergy & Precautions, H&P , NPO status , Patient's Chart, lab work & pertinent test results, reviewed documented beta blocker date and time   Airway Mallampati: II  TM Distance: >3 FB Neck ROM: full    Dental  (+) Edentulous Upper, Edentulous Lower   Pulmonary COPD, Current Smoker   Pulmonary exam normal breath sounds clear to auscultation       Cardiovascular Exercise Tolerance: Good negative cardio ROS  Rhythm:regular Rate:Normal     Neuro/Psych Seizures -,  PSYCHIATRIC DISORDERS Anxiety  Bipolar Disorder   CVA (mild right sided weakness, mostly resolved)    GI/Hepatic PUD (gastric ulcer),GERD  ,,(+)     substance abuse (former)  alcohol use  Endo/Other  negative endocrine ROS    Renal/GU negative Renal ROS  negative genitourinary   Musculoskeletal   Abdominal   Peds  Hematology  (+) Blood dyscrasia, anemia   Anesthesia Other Findings   Reproductive/Obstetrics negative OB ROS                             Anesthesia Physical Anesthesia Plan  ASA: 3  Anesthesia Plan: General   Post-op Pain Management:    Induction:   PONV Risk Score and Plan:   Airway Management Planned:   Additional Equipment:   Intra-op Plan:   Post-operative Plan:   Informed Consent: I have reviewed the patients History and Physical, chart, labs and discussed the procedure including the risks, benefits and alternatives for the proposed anesthesia with the patient or authorized representative who has indicated his/her understanding and acceptance.     Dental Advisory Given  Plan Discussed with: CRNA  Anesthesia Plan Comments:         Anesthesia Quick Evaluation

## 2022-09-13 NOTE — Transfer of Care (Signed)
Immediate Anesthesia Transfer of Care Note  Patient: [name]  Procedure(s) Performed: COLONOSCOPY WITH PROPOFOL ESOPHAGOGASTRODUODENOSCOPY (EGD) WITH PROPOFOL BIOPSY POLYPECTOMY  Patient Location: Short Stay  Anesthesia Type:General  Level of Consciousness: awake, alert , oriented, and patient cooperative  Airway & Oxygen Therapy: Patient Spontanous Breathing  Post-op Assessment: Report given to RN, Post -op Vital signs reviewed and stable, and Patient moving all extremities X 4  Post vital signs: Reviewed and stable  Last Vitals:  Vitals Value Taken Time  BP 127/63 09/13/22 0811  Temp 36.4 C 09/13/22 0811  Pulse 76 09/13/22 0811  Resp 20 09/13/22 0811  SpO2 95 % 09/13/22 0811    Last Pain:  Vitals:   09/13/22 0811  TempSrc: Oral  PainSc: 0-No pain      Patients Stated Pain Goal: 6 (17/51/02 5852)  Complications: No notable events documented.

## 2022-09-13 NOTE — Op Note (Signed)
[facility] Patient Name: [name] Procedure Date: 09/13/2022 7:48 AM MRN: [id number] Date of Birth: [birth year] Attending MD: Elon Alas. Abbey Chatters , [place], [phone] CSN: [id number] Age: 71 Admit Type: Outpatient Procedure:                Colonoscopy Indications:              Surveillance: Personal history of adenomatous                            polyps on last colonoscopy > 5 years ago Providers:                Elon Alas. Abbey Chatters, DO, Lambert Mody, Dereck Leep, Technician Referring MD:              Medicines:                See the Anesthesia note for documentation of the                            administered medications Complications:            No immediate complications. Estimated Blood Loss:     Estimated blood loss was minimal. Procedure:                Pre-Anesthesia Assessment:                           - The anesthesia plan was to use monitored                            anesthesia care (MAC).                           After obtaining informed consent, the colonoscope                            was passed under direct vision. Throughout the                            procedure, the patient's blood pressure, pulse, and                            oxygen saturations were monitored continuously. The                            [id number]-HQ190L ([id number]) scope was introduced through                            the anus and advanced to the the cecum, identified                            by appendiceal orifice and ileocecal valve. The                            colonoscopy was  performed without difficulty. The                            patient tolerated the procedure well. The quality                            of the bowel preparation was evaluated using the                            BBPS [facility] Bowel Preparation Scale) with scores                            of: Right Colon = 3, Transverse Colon = 3 and Left                             Colon = 3 (entire mucosa seen well with no residual                            staining, small fragments of stool or opaque                            liquid). The total BBPS score equals 9. Scope In: 7:51:15 AM Scope Out: 8:04:13 AM Scope Withdrawal Time: 0 hours 9 minutes 51 seconds  Total Procedure Duration: 0 hours 12 minutes 58 seconds  Findings:      The perianal and digital rectal examinations were normal.      Non-bleeding internal hemorrhoids were found during endoscopy.      Multiple medium-mouthed and small-mouthed diverticula were found in the       sigmoid colon.      A 2 mm polyp was found in the ascending colon. The polyp was sessile.       The polyp was removed with a cold biopsy forceps. Resection and       retrieval were complete.      The exam was otherwise without abnormality. Impression:               - Non-bleeding internal hemorrhoids.                           - Diverticulosis in the sigmoid colon.                           - One 2 mm polyp in the ascending colon, removed                            with a cold biopsy forceps. Resected and retrieved.                           - The examination was otherwise normal. Moderate Sedation:      Per Anesthesia Care Recommendation:           - Patient has a contact number available for                            emergencies. The signs and symptoms of potential  delayed complications were discussed with the                            patient. Return to normal activities tomorrow.                            Written discharge instructions were provided to the                            patient.                           - Resume previous diet.                           - Continue present medications.                           - Await pathology results.                           - Repeat colonoscopy in 5 years for surveillance                            and history of polyps if benefits outweigh  risks.                           - Return to GI clinic in 3 months. Procedure Code(s):        --- Professional ---                           [phone], Colonoscopy, flexible; with biopsy, single                            or multiple Diagnosis Code(s):        --- Professional ---                           Z86.010, Personal history of colonic polyps                           K64.8, Other hemorrhoids                           D12.2, Benign neoplasm of ascending colon                           K57.30, Diverticulosis of large intestine without                            perforation or abscess without bleeding CPT copyright 2022 American Medical Association. All rights reserved. The codes documented in this report are preliminary and upon coder review may  be revised to meet current compliance requirements. Elon Alas. Abbey Chatters, DO [place] Abbey Chatters, DO 09/13/2022 8:09:24 AM This report has been signed electronically. Number of Addenda: 0

## 2022-09-13 NOTE — Discharge Instructions (Addendum)
EGD Discharge instructions Please read the instructions outlined below and refer to this sheet in the next few weeks. These discharge instructions provide you with general information on caring for yourself after you leave the hospital. Your doctor may also give you specific instructions. While your treatment has been planned according to the most current medical practices available, unavoidable complications occasionally occur. If you have any problems or questions after discharge, please call your doctor. ACTIVITY You may resume your regular activity but move at a slower pace for the next 24 hours.  Take frequent rest periods for the next 24 hours.  Walking will help expel (get rid of) the air and reduce the bloated feeling in your abdomen.  No driving for 24 hours (because of the anesthesia (medicine) used during the test).  You may shower.  Do not sign any important legal documents or operate any machinery for 24 hours (because of the anesthesia used during the test).  NUTRITION Drink plenty of fluids.  You may resume your normal diet.  Begin with a light meal and progress to your normal diet.  Avoid alcoholic beverages for 24 hours or as instructed by your caregiver.  MEDICATIONS You may resume your normal medications unless your caregiver tells you otherwise.  WHAT YOU CAN EXPECT TODAY You may experience abdominal discomfort such as a feeling of fullness or "gas" pains.  FOLLOW-UP Your doctor will discuss the results of your test with you.  SEEK IMMEDIATE MEDICAL ATTENTION IF ANY OF THE FOLLOWING OCCUR: Excessive nausea (feeling sick to your stomach) and/or vomiting.  Severe abdominal pain and distention (swelling).  Trouble swallowing.  Temperature over 101 F (37.8 C).  Rectal bleeding or vomiting of blood.    Colonoscopy Discharge Instructions  Read the instructions outlined below and refer to this sheet in the next few weeks. These discharge instructions provide you with  general information on caring for yourself after you leave the hospital. Your doctor may also give you specific instructions. While your treatment has been planned according to the most current medical practices available, unavoidable complications occasionally occur.   ACTIVITY You may resume your regular activity, but move at a slower pace for the next 24 hours.  Take frequent rest periods for the next 24 hours.  Walking will help get rid of the air and reduce the bloated feeling in your belly (abdomen).  No driving for 24 hours (because of the medicine (anesthesia) used during the test).   Do not sign any important legal documents or operate any machinery for 24 hours (because of the anesthesia used during the test).  NUTRITION Drink plenty of fluids.  You may resume your normal diet as instructed by your doctor.  Begin with a light meal and progress to your normal diet. Heavy or fried foods are harder to digest and may make you feel sick to your stomach (nauseated).  Avoid alcoholic beverages for 24 hours or as instructed.  MEDICATIONS You may resume your normal medications unless your doctor tells you otherwise.  WHAT YOU CAN EXPECT TODAY Some feelings of bloating in the abdomen.  Passage of more gas than usual.  Spotting of blood in your stool or on the toilet paper.  IF YOU HAD POLYPS REMOVED DURING THE COLONOSCOPY: No aspirin products for 7 days or as instructed.  No alcohol for 7 days or as instructed.  Eat a soft diet for the next 24 hours.  FINDING OUT THE RESULTS OF YOUR TEST Not all test results are available  during your visit. If your test results are not back during the visit, make an appointment with your caregiver to find out the results. Do not assume everything is normal if you have not heard from your caregiver or the medical facility. It is important for you to follow up on all of your test results.  SEEK IMMEDIATE MEDICAL ATTENTION IF: You have more than a spotting of  blood in your stool.  Your belly is swollen (abdominal distention).  You are nauseated or vomiting.  You have a temperature over 101.  You have abdominal pain or discomfort that is severe or gets worse throughout the day.   Previously noted yeast infection in your esophagus has healed.  Your esophagus was wide open, I did not need to stretch it today.  Your EGD revealed mild amount inflammation in your stomach.  I took biopsies of this to rule out infection with a bacteria called H. pylori.  Await pathology results, my office will contact you.  Small bowel appeared normal.  Continue on omeprazole daily.  Your colonoscopy revealed 1 polyp(s) which I removed successfully. Await pathology results, my office will contact you. I recommend repeating colonoscopy in 5 years for surveillance purposes if benefits outweigh risk.  You also have diverticulosis and internal hemorrhoids. I would recommend increasing fiber in your diet or adding OTC Benefiber/Metamucil. Be sure to drink at least 4 to 6 glasses of water daily. Follow-up with GI in 3 months. Office will update you with this appointment.   I hope you have a great rest of your week!  Elon Alas. Abbey Chatters, D.O. Gastroenterology and Hepatology [facility]

## 2022-09-14 LAB — SURGICAL PATHOLOGY

## 2022-09-17 ENCOUNTER — Telehealth: Payer: Self-pay

## 2022-09-17 NOTE — Telephone Encounter (Signed)
Noted   Phoned and LMOVM of the pt to return call 

## 2022-09-17 NOTE — Telephone Encounter (Signed)
Pt phoned and advised that she is having alo of burning and bloating in the abdomen. She states especially since the colonoscopy. She states that she has a Hx of ulcers. Pt states she wants a antibiotic sent in to take to help her symptoms. Bloating and burning is all she is experiencing. Please advise

## 2022-09-18 NOTE — Telephone Encounter (Signed)
Phoned and advised of the recommendations from Dr Gala Romney. Pt expressed understanding. Advised of her reports as well. Recommendations of what to take now and then report to [place]

## 2022-09-24 ENCOUNTER — Encounter (HOSPITAL_COMMUNITY): Payer: Self-pay | Admitting: Internal Medicine

## 2022-09-25 ENCOUNTER — Other Ambulatory Visit (HOSPITAL_COMMUNITY): Payer: Medicare Other

## 2022-09-26 ENCOUNTER — Other Ambulatory Visit (HOSPITAL_COMMUNITY): Payer: Self-pay | Admitting: Hematology

## 2022-09-27 DIAGNOSIS — I10 Essential (primary) hypertension: Secondary | ICD-10-CM | POA: Diagnosis not present

## 2022-09-27 DIAGNOSIS — K219 Gastro-esophageal reflux disease without esophagitis: Secondary | ICD-10-CM | POA: Diagnosis not present

## 2022-09-27 DIAGNOSIS — J449 Chronic obstructive pulmonary disease, unspecified: Secondary | ICD-10-CM | POA: Diagnosis not present

## 2022-10-17 DIAGNOSIS — Z6822 Body mass index (BMI) 22.0-22.9, adult: Secondary | ICD-10-CM | POA: Diagnosis not present

## 2022-10-17 DIAGNOSIS — M1991 Primary osteoarthritis, unspecified site: Secondary | ICD-10-CM | POA: Diagnosis not present

## 2022-10-17 DIAGNOSIS — G894 Chronic pain syndrome: Secondary | ICD-10-CM | POA: Diagnosis not present

## 2022-10-17 DIAGNOSIS — J449 Chronic obstructive pulmonary disease, unspecified: Secondary | ICD-10-CM | POA: Diagnosis not present

## 2022-10-24 ENCOUNTER — Telehealth: Payer: Self-pay | Admitting: *Deleted

## 2022-10-24 ENCOUNTER — Telehealth: Payer: Self-pay | Admitting: Internal Medicine

## 2022-10-24 ENCOUNTER — Other Ambulatory Visit: Payer: Self-pay

## 2022-10-24 NOTE — Telephone Encounter (Signed)
[name] with [facility] left a message for someone to fax the colonoscopy/endoscopy results to their office.... [phone]

## 2022-10-24 NOTE — Telephone Encounter (Signed)
Patient called to inquire as to if her gabapentin has been filled.  I advised her that it was denied due to many missed appointments and she has not been seen for a year.  Made her aware that she could make an appointment or consult with her PCP for further refills.  Stated that she did not want to make an appointment at this time.

## 2022-10-28 DIAGNOSIS — I10 Essential (primary) hypertension: Secondary | ICD-10-CM | POA: Diagnosis not present

## 2022-10-28 DIAGNOSIS — J449 Chronic obstructive pulmonary disease, unspecified: Secondary | ICD-10-CM | POA: Diagnosis not present

## 2022-10-28 DIAGNOSIS — K219 Gastro-esophageal reflux disease without esophagitis: Secondary | ICD-10-CM | POA: Diagnosis not present

## 2022-10-30 ENCOUNTER — Other Ambulatory Visit (HOSPITAL_COMMUNITY): Payer: Self-pay | Admitting: Hematology

## 2022-11-12 ENCOUNTER — Other Ambulatory Visit (HOSPITAL_COMMUNITY): Payer: Self-pay | Admitting: Internal Medicine

## 2022-11-12 DIAGNOSIS — C50211 Malignant neoplasm of upper-inner quadrant of right female breast: Secondary | ICD-10-CM | POA: Diagnosis not present

## 2022-11-12 DIAGNOSIS — D518 Other vitamin B12 deficiency anemias: Secondary | ICD-10-CM | POA: Diagnosis not present

## 2022-11-12 DIAGNOSIS — G894 Chronic pain syndrome: Secondary | ICD-10-CM | POA: Diagnosis not present

## 2022-11-12 DIAGNOSIS — Z0001 Encounter for general adult medical examination with abnormal findings: Secondary | ICD-10-CM | POA: Diagnosis not present

## 2022-11-12 DIAGNOSIS — E559 Vitamin D deficiency, unspecified: Secondary | ICD-10-CM | POA: Diagnosis not present

## 2022-11-12 DIAGNOSIS — J449 Chronic obstructive pulmonary disease, unspecified: Secondary | ICD-10-CM | POA: Diagnosis not present

## 2022-11-12 DIAGNOSIS — R0989 Other specified symptoms and signs involving the circulatory and respiratory systems: Secondary | ICD-10-CM | POA: Diagnosis not present

## 2022-11-12 DIAGNOSIS — I700 Atherosclerosis of aorta: Secondary | ICD-10-CM | POA: Diagnosis not present

## 2022-11-12 DIAGNOSIS — E039 Hypothyroidism, unspecified: Secondary | ICD-10-CM | POA: Diagnosis not present

## 2022-11-12 DIAGNOSIS — Z6821 Body mass index (BMI) 21.0-21.9, adult: Secondary | ICD-10-CM | POA: Diagnosis not present

## 2022-11-15 ENCOUNTER — Other Ambulatory Visit: Payer: Self-pay | Admitting: *Deleted

## 2022-11-15 ENCOUNTER — Other Ambulatory Visit: Payer: Self-pay | Admitting: Physician Assistant

## 2022-11-15 NOTE — Telephone Encounter (Signed)
Refill approved for Tamoxifen.  Patient is tolerating and is to continue therapy.

## 2022-12-07 ENCOUNTER — Ambulatory Visit (HOSPITAL_COMMUNITY)
Admission: RE | Admit: 2022-12-07 | Discharge: 2022-12-07 | Disposition: A | Discharge status: Home / Self Care | Payer: 59 | Source: Ambulatory Visit | Attending: Internal Medicine | Admitting: Internal Medicine

## 2022-12-07 DIAGNOSIS — I6523 Occlusion and stenosis of bilateral carotid arteries: Secondary | ICD-10-CM | POA: Diagnosis not present

## 2022-12-07 DIAGNOSIS — R0989 Other specified symptoms and signs involving the circulatory and respiratory systems: Secondary | ICD-10-CM | POA: Diagnosis not present

## 2022-12-13 ENCOUNTER — Ambulatory Visit: Payer: 59 | Admitting: Gastroenterology

## 2022-12-25 NOTE — Progress Notes (Deleted)
GI Office Note    Referring Provider: Redmond School, MD Primary Care Physician:  Redmond School, MD Primary Gastroenterologist: Cristopher Estimable.Rourk, MD  Date:  12/25/2022  ID:  [name], DOB [birth year], MRN [id number]   Chief Complaint   No chief complaint on file.  History of Present Illness  [name] is a 72 y.o. female with a history of *** presenting today with complaint of   EGD 07/04/22: -***  Last office visit 08/15/22. ***  EGD 09/13/22: -***  Colonoscopy 09/13/22: -***  Today:   Current Outpatient Medications  Medication Sig Dispense Refill   albuterol (PROVENTIL) (2.5 MG/3ML) 0.083% nebulizer solution Take 2.5 mg by nebulization every Monday, Wednesday, and Friday.     albuterol (VENTOLIN HFA) 108 (90 Base) MCG/ACT inhaler Inhale 2 puffs into the lungs every 6 (six) hours as needed for wheezing or shortness of breath.     Aromatic Inhalants (VICKS VAPOINHALER IN) Inhale 1 puff into the lungs daily as needed (congestion).     atorvastatin (LIPITOR) 20 MG tablet Take 20 mg by mouth every evening.      carboxymethylcellulose (REFRESH PLUS) 0.5 % SOLN Place 1 drop into both eyes 3 (three) times daily as needed (dry/irritated eyes.).     clonazePAM (KLONOPIN) 1 MG tablet Take 1 tablet (1 mg total) by mouth 3 (three) times daily. (Patient taking differently: Take 1 mg by mouth 4 (four) times daily as needed for anxiety.)     dicyclomine (BENTYL) 10 MG capsule Take 10 mg by mouth 2 (two) times daily.     fluconazole (DIFLUCAN) 100 MG tablet Take 2 tablets ('200mg'$ ) the first day and then 1 tablet once a day for the next 21 days. (Patient not taking: Reported on 08/15/2022) 23 tablet 0   gabapentin (NEURONTIN) 300 MG capsule TAKE (1) CAPSULE BY MOUTH THREE TIMES DAILY 90 capsule 3   hydrOXYzine (ATARAX) 25 MG tablet Take 25 mg by mouth 2 (two) times daily.     Multiple Vitamin (MULTIVITAMIN WITH MINERALS) TABS tablet Take 1 tablet by mouth 3 (three) times a week.      omeprazole (PRILOSEC) 40 MG capsule Take 40 mg by mouth daily.      phenytoin (DILANTIN) 100 MG ER capsule Take 400 mg by mouth at bedtime.     STIOLTO RESPIMAT 2.5-2.5 MCG/ACT AERS Inhale 2 puffs into the lungs daily.     tamoxifen (NOLVADEX) 20 MG tablet TAKE ONE TABLET BY MOUTH ONCE DAILY. 90 tablet 0   tiZANidine (ZANAFLEX) 4 MG tablet Take 4 mg by mouth 2 (two) times daily.     traZODone (DESYREL) 150 MG tablet Take 225 mg by mouth at bedtime.     No current facility-administered medications for this visit.    Past Medical History:  Diagnosis Date   Alcohol abuse, in remission    Anxiety    Arthritis    Bipolar disorder ([place])    Colitis    COPD (chronic obstructive pulmonary disease) (HCC)    occasional home O2   Gastric ulcer    GERD (gastroesophageal reflux disease)    Hemorrhoids    IBS (irritable bowel syndrome)    Seizures ([place])    last seizure was about a year ago   Stroke [facility])    right sided weakness-slight    Past Surgical History:  Procedure Laterality Date   ABDOMINAL HYSTERECTOMY     BIOPSY  09/13/2022   Procedure: BIOPSY;  Surgeon: Eloise Harman, DO;  [facility]  [facility];  Service: Endoscopy;;   CHOLECYSTECTOMY     COLONOSCOPY WITH PROPOFOL N/A 09/13/2022   Procedure: COLONOSCOPY WITH PROPOFOL;  Surgeon: Eloise Harman, DO;  Location: [facility];  Service: Endoscopy;  Laterality: N/A;  11:15 am   COMPRESSION HIP SCREW Right 04/25/2019   Procedure: COMPRESSION HIP;  Surgeon: Carole Civil, MD;  Location: [facility];  Service: Orthopedics;  Laterality: Right;  1030am   ESOPHAGEAL BRUSHING  07/04/2022   Procedure: ESOPHAGEAL BRUSHING;  Surgeon: Daneil Dolin, MD;  [facility];  Service: Endoscopy;;   ESOPHAGOGASTRODUODENOSCOPY (EGD) WITH PROPOFOL N/A 07/04/2022   Procedure: ESOPHAGOGASTRODUODENOSCOPY (EGD) WITH PROPOFOL;  Surgeon: Daneil Dolin, MD;  [facility];  Service: Endoscopy;  Laterality: N/A;   ESOPHAGOGASTRODUODENOSCOPY WITH FOOD IMPACTION REMOVAL   ESOPHAGOGASTRODUODENOSCOPY (EGD) WITH PROPOFOL N/A 09/13/2022   Procedure: ESOPHAGOGASTRODUODENOSCOPY (EGD) WITH PROPOFOL;  Surgeon: Eloise Harman, DO;  [facility];  Service: Endoscopy;  Laterality: N/A;   EYE SURGERY Bilateral    cataract removal   MASTECTOMY, PARTIAL Right 07/20/2020   Procedure: MASTECTOMY PARTIAL;  Surgeon: Aviva Signs, MD;  Location: [facility];  Service: General;  Laterality: Right;   PARTIAL MASTECTOMY WITH AXILLARY SENTINEL LYMPH NODE BIOPSY Right 07/06/2020   Procedure: RIGHT PARTIAL MASTECTOMY WITH AXILLARY SENTINEL LYMPH NODE BIOPSY;  Surgeon: Aviva Signs, MD;  Location: [facility];  Service: General;  Laterality: Right;   POLYPECTOMY  09/13/2022   Procedure: POLYPECTOMY;  Surgeon: Eloise Harman, DO;  [facility];  Service: Endoscopy;;    Family History  Problem Relation Age of Onset   Dementia Father     Allergies as of 12/26/2022 - Review Complete 09/13/2022  Allergen Reaction Noted   Varenicline Hives 07/16/2017   Codeine Nausea And Vomiting 05/21/2017   Influenza virus vaccine Nausea And Vomiting 08/02/2013   Lithium Nausea And Vomiting 10/26/2011   Haemophilus b polysaccharide vaccine Nausea And Vomiting 08/02/2013    Social History   Socioeconomic History   Marital status: Married    Spouse name: Not on file   Number of children: Not on file   Years of education: Not on file   Highest education level: Not on file  Occupational History   Occupation: retired  Tobacco Use   Smoking status: Every Day    Packs/day: 0.50    Years: 32.00    Total pack years: 16.00    Types: Cigarettes   Smokeless tobacco: Never  Vaping Use   Vaping Use: Some days  Substance and Sexual Activity   Alcohol use: No    Comment: h/o heavy use, quit in 1995   Drug use: Yes    Types: Marijuana    Comment: sometimes   Sexual activity: Not Currently  Other Topics Concern   Not on  file  Social History Narrative   Not on file   Social Determinants of Health   Financial Resource Strain: Low Risk  (08/11/2020)   Overall Financial Resource Strain (CARDIA)    Difficulty of Paying Living Expenses: Not hard at all  Food Insecurity: No Food Insecurity (08/11/2020)   Hunger Vital Sign    Worried About Estate manager/land agent of Food in the Last Year: Never true    Ran Out of Food in the Last Year: Never true  Transportation Needs: No Transportation Needs (08/11/2020)   PRAPARE - Hydrologist (Medical): No    Lack of Transportation (Non-Medical): No  Physical Activity:  Insufficiently Active (08/11/2020)   Exercise Vital Sign    Days of Exercise per Week: 7 days    Minutes of Exercise per Session: 10 min  Stress: Stress Concern Present (08/11/2020)   [place]    Feeling of Stress : Rather much  Social Connections: Moderately Isolated (08/11/2020)   Social Connection and Isolation Panel [NHANES]    Frequency of Communication with Friends and Family: Three times a week    Frequency of Social Gatherings with Friends and Family: Never    Attends Religious Services: Never    Marine scientist or Organizations: No    Attends Music therapist: Never    Marital Status: Married     Review of Systems   Gen: Denies fever, chills, anorexia. Denies fatigue, weakness, weight loss.  CV: Denies chest pain, palpitations, syncope, peripheral edema, and claudication. Resp: Denies dyspnea at rest, cough, wheezing, coughing up blood, and pleurisy. GI: See HPI Derm: Denies rash, itching, dry skin Psych: Denies depression, anxiety, memory loss, confusion. No homicidal or suicidal ideation.  Heme: Denies bruising, bleeding, and enlarged lymph nodes.   Physical Exam   There were no vitals taken for this visit.  General:   Alert and oriented. No distress noted. Pleasant and  cooperative.  Head:  Normocephalic and atraumatic. Eyes:  Conjuctiva clear without scleral icterus. Mouth:  Oral mucosa pink and moist. Good dentition. No lesions. Lungs:  Clear to auscultation bilaterally. No wheezes, rales, or rhonchi. No distress.  Heart:  S1, S2 present without murmurs appreciated.  Abdomen:  +BS, soft, non-tender and non-distended. No rebound or guarding. No HSM or masses noted. Rectal: *** Msk:  Symmetrical without gross deformities. Normal posture. Extremities:  Without edema. Neurologic:  Alert and  oriented x4 Psych:  Alert and cooperative. Normal mood and affect.   Assessment  [name] is a 72 y.o. female with a history of *** presenting today with   History of colon polyps:   GERD, Dysphagia:   PLAN   ***     Venetia Night, MSN, FNP-BC, AGACNP-BC [facility]

## 2022-12-26 ENCOUNTER — Ambulatory Visit: Payer: 59 | Admitting: Gastroenterology

## 2022-12-26 ENCOUNTER — Encounter: Payer: Self-pay | Admitting: Gastroenterology

## 2022-12-26 DIAGNOSIS — R531 Weakness: Secondary | ICD-10-CM | POA: Diagnosis not present

## 2022-12-27 ENCOUNTER — Other Ambulatory Visit (HOSPITAL_COMMUNITY): Payer: 59

## 2022-12-27 ENCOUNTER — Emergency Department (HOSPITAL_COMMUNITY)
Admission: EM | Admit: 2022-12-27 | Discharge: 2022-12-27 | Disposition: A | Discharge status: Home / Self Care | Payer: 59 | Attending: Emergency Medicine | Admitting: Emergency Medicine

## 2022-12-27 ENCOUNTER — Emergency Department (HOSPITAL_COMMUNITY): Payer: 59

## 2022-12-27 DIAGNOSIS — Z5329 Procedure and treatment not carried out because of patient's decision for other reasons: Secondary | ICD-10-CM | POA: Insufficient documentation

## 2022-12-27 DIAGNOSIS — M79606 Pain in leg, unspecified: Secondary | ICD-10-CM | POA: Diagnosis not present

## 2022-12-27 DIAGNOSIS — Z743 Need for continuous supervision: Secondary | ICD-10-CM | POA: Diagnosis not present

## 2022-12-27 DIAGNOSIS — Z79899 Other long term (current) drug therapy: Secondary | ICD-10-CM | POA: Diagnosis not present

## 2022-12-27 DIAGNOSIS — R531 Weakness: Secondary | ICD-10-CM | POA: Insufficient documentation

## 2022-12-27 LAB — CBC WITH DIFFERENTIAL/PLATELET
Abs Immature Granulocytes: 0.01 10*3/uL (ref 0.00–0.07)
Basophils Absolute: 0.1 10*3/uL (ref 0.0–0.1)
Basophils Relative: 1 %
Eosinophils Absolute: 0.2 10*3/uL (ref 0.0–0.5)
Eosinophils Relative: 4 %
HCT: 38.7 % (ref 36.0–46.0)
Hemoglobin: 13 g/dL (ref 12.0–15.0)
Immature Granulocytes: 0 %
Lymphocytes Relative: 40 %
Lymphs Abs: 2.1 10*3/uL (ref 0.7–4.0)
MCH: 32.7 pg (ref 26.0–34.0)
MCHC: 33.6 g/dL (ref 30.0–36.0)
MCV: 97.2 fL (ref 80.0–100.0)
Monocytes Absolute: 0.7 10*3/uL (ref 0.1–1.0)
Monocytes Relative: 12 %
Neutro Abs: 2.3 10*3/uL (ref 1.7–7.7)
Neutrophils Relative %: 43 %
Platelets: 194 10*3/uL (ref 150–400)
RBC: 3.98 MIL/uL (ref 3.87–5.11)
RDW: 13.6 % (ref 11.5–15.5)
WBC: 5.2 10*3/uL (ref 4.0–10.5)
nRBC: 0 % (ref 0.0–0.2)

## 2022-12-27 LAB — COMPREHENSIVE METABOLIC PANEL
ALT: 11 U/L (ref 0–44)
AST: 13 U/L — ABNORMAL LOW (ref 15–41)
Albumin: 3.5 g/dL (ref 3.5–5.0)
Alkaline Phosphatase: 72 U/L (ref 38–126)
Anion gap: 6 (ref 5–15)
BUN: 15 mg/dL (ref 8–23)
CO2: 26 mmol/L (ref 22–32)
Calcium: 8.3 mg/dL — ABNORMAL LOW (ref 8.9–10.3)
Chloride: 103 mmol/L (ref 98–111)
Creatinine, Ser: 0.97 mg/dL (ref 0.44–1.00)
GFR, Estimated: >60 mL/min (ref >60)
Glucose, Bld: 103 mg/dL — ABNORMAL HIGH (ref 70–99)
Potassium: 4.8 mmol/L (ref 3.5–5.1)
Sodium: 135 mmol/L (ref 135–145)
Total Bilirubin: 0.5 mg/dL (ref 0.3–1.2)
Total Protein: 6.7 g/dL (ref 6.5–8.1)

## 2022-12-27 LAB — ETHANOL: Alcohol, Ethyl (B): <10 mg/dL (ref <10)

## 2022-12-27 NOTE — ED Notes (Signed)
This RN informed pt she was up for discharge, we are just waiting on her discharge papers--pt states "I am ready to go now, I am not waiting on my papers"

## 2022-12-27 NOTE — ED Triage Notes (Signed)
Pt arrived via [facility] c/o shakes for 2 days and bilateral leg numbness x 2 days

## 2022-12-27 NOTE — ED Notes (Signed)
Pt given something to drink.  °

## 2022-12-27 NOTE — ED Provider Notes (Signed)
[facility] Provider Note   CSN: [id number] Arrival date & time: 12/27/22  [id number]     History  No chief complaint on file.   [name] is a 72 y.o. female.  Patient has a history of EtOH abuse.  She states that she been having weakness in her legs and shaking her legs.  The history is provided by the patient and medical records.  Weakness Severity:  Moderate Onset quality:  Sudden Timing:  Constant Progression:  Waxing and waning Chronicity:  New Context: not alcohol use   Relieved by:  Nothing Worsened by:  Nothing Ineffective treatments:  None tried Associated symptoms: no abdominal pain, no chest pain, no cough, no diarrhea, no frequency, no headaches and no seizures        Home Medications Prior to Admission medications   Medication Sig Start Date End Date Taking? Authorizing Provider  albuterol (PROVENTIL) (2.5 MG/3ML) 0.083% nebulizer solution Take 2.5 mg by nebulization every Monday, Wednesday, and Friday.    [provider]  albuterol (VENTOLIN HFA) 108 (90 Base) MCG/ACT inhaler Inhale 2 puffs into the lungs every 6 (six) hours as needed for wheezing or shortness of breath.    [provider]  Aromatic Inhalants (VICKS VAPOINHALER IN) Inhale 1 puff into the lungs daily as needed (congestion).    [provider]  atorvastatin (LIPITOR) 20 MG tablet Take 20 mg by mouth every evening.  01/08/19   [provider]  carboxymethylcellulose (REFRESH PLUS) 0.5 % SOLN Place 1 drop into both eyes 3 (three) times daily as needed (dry/irritated eyes.).    [provider]  clonazePAM (KLONOPIN) 1 MG tablet Take 1 tablet (1 mg total) by mouth 3 (three) times daily. Patient taking differently: Take 1 mg by mouth 4 (four) times daily as needed for anxiety. 04/26/19   Barton Dubois, MD  dicyclomine (BENTYL) 10 MG capsule Take 10 mg by mouth 2 (two) times daily.    [provider]   fluconazole (DIFLUCAN) 100 MG tablet Take 2 tablets ('200mg'$ ) the first day and then 1 tablet once a day for the next 21 days. Patient not taking: Reported on 08/15/2022 07/06/22   Sabrina Dolin, MD  gabapentin (NEURONTIN) 300 MG capsule TAKE (1) CAPSULE BY MOUTH THREE TIMES DAILY 10/30/22   Derek Jack, MD  hydrOXYzine (ATARAX) 25 MG tablet Take 25 mg by mouth 2 (two) times daily. 09/10/22   [provider]  Multiple Vitamin (MULTIVITAMIN WITH MINERALS) TABS tablet Take 1 tablet by mouth 3 (three) times a week.    [provider]  omeprazole (PRILOSEC) 40 MG capsule Take 40 mg by mouth daily.  10/04/16   [provider]  phenytoin (DILANTIN) 100 MG ER capsule Take 400 mg by mouth at bedtime.    [provider]  STIOLTO RESPIMAT 2.5-2.5 MCG/ACT AERS Inhale 2 puffs into the lungs daily. 06/25/22   [provider]  tamoxifen (NOLVADEX) 20 MG tablet TAKE ONE TABLET BY MOUTH ONCE DAILY. 11/15/22   Derek Jack, MD  tiZANidine (ZANAFLEX) 4 MG tablet Take 4 mg by mouth 2 (two) times daily. 08/22/18   [provider]  traZODone (DESYREL) 150 MG tablet Take 225 mg by mouth at bedtime.    [provider]      Allergies    Varenicline, Codeine, Influenza virus vaccine, Lithium, and Haemophilus b polysaccharide vaccine    Review of Systems   Review of Systems  Constitutional:  Negative for appetite change and fatigue.  HENT:  Negative for congestion, ear discharge and sinus pressure.   Eyes:  Negative for discharge.  Respiratory:  Negative for cough.   Cardiovascular:  Negative for chest pain.  Gastrointestinal:  Negative for abdominal pain and diarrhea.  Genitourinary:  Negative for frequency and hematuria.  Musculoskeletal:  Negative for back pain.  Skin:  Negative for rash.  Neurological:  Positive for weakness. Negative for seizures and headaches.  Psychiatric/Behavioral:  Negative for hallucinations.     Physical  Exam Updated Vital Signs BP 117/64 (BP Location: Right Arm)   Pulse 70   Temp 98.5 F (36.9 C) (Oral)   Resp 18   Ht 5' 3.5" (1.613 m)   Wt 54.4 kg   SpO2 96%   BMI 20.92 kg/m  Physical Exam Vitals and nursing note reviewed.  Constitutional:      Appearance: She is well-developed.  HENT:     Head: Normocephalic.     Nose: Nose normal.  Eyes:     General: No scleral icterus.    Conjunctiva/sclera: Conjunctivae normal.  Neck:     Thyroid: No thyromegaly.  Cardiovascular:     Rate and Rhythm: Normal rate and regular rhythm.     Heart sounds: No murmur heard.    No friction rub. No gallop.  Pulmonary:     Breath sounds: No stridor. No wheezing or rales.  Chest:     Chest wall: No tenderness.  Abdominal:     General: There is no distension.     Tenderness: There is no abdominal tenderness. There is no rebound.  Musculoskeletal:        General: Normal range of motion.     Cervical back: Neck supple.  Lymphadenopathy:     Cervical: No cervical adenopathy.  Skin:    Findings: No erythema or rash.  Neurological:     Mental Status: She is alert and oriented to person, place, and time.     Motor: No abnormal muscle tone.     Coordination: Coordination normal.  Psychiatric:        Behavior: Behavior normal.     ED Results / Procedures / Treatments   Labs (all labs ordered are listed, but only abnormal results are displayed) Labs Reviewed  COMPREHENSIVE METABOLIC PANEL - Abnormal; Notable for the following components:      Result Value   Glucose, Bld 103 (*)    Calcium 8.3 (*)    AST 13 (*)    All other components within normal limits  CBC WITH DIFFERENTIAL/PLATELET  ETHANOL    EKG None  Radiology No results found.  Procedures Procedures    Medications Ordered in ED Medications - No data to display  ED Course/ Medical Decision Making/ A&P                             Medical Decision Making Amount and/or Complexity of Data Reviewed Labs:  ordered. Radiology: ordered.   Patient left AMA before she was able to get her MRI scan.  Labs unremarkable        Final Clinical Impression(s) / ED Diagnoses Final diagnoses:  Weakness    Rx / DC Orders ED Discharge Orders     None         Milton Ferguson, MD 12/29/22 [phone]

## 2022-12-27 NOTE — ED Notes (Signed)
Pt states "I do not want an MRI and I would like to go home" MD made aware

## 2022-12-28 ENCOUNTER — Other Ambulatory Visit: Payer: Self-pay

## 2022-12-28 ENCOUNTER — Emergency Department (HOSPITAL_COMMUNITY): Payer: 59

## 2022-12-28 ENCOUNTER — Emergency Department (HOSPITAL_COMMUNITY)
Admission: EM | Admit: 2022-12-28 | Discharge: 2022-12-28 | Disposition: A | Discharge status: Home / Self Care | Payer: 59 | Attending: Emergency Medicine | Admitting: Emergency Medicine

## 2022-12-28 DIAGNOSIS — Z5329 Procedure and treatment not carried out because of patient's decision for other reasons: Secondary | ICD-10-CM | POA: Diagnosis not present

## 2022-12-28 DIAGNOSIS — M4316 Spondylolisthesis, lumbar region: Secondary | ICD-10-CM | POA: Diagnosis not present

## 2022-12-28 DIAGNOSIS — R531 Weakness: Secondary | ICD-10-CM

## 2022-12-28 DIAGNOSIS — Z743 Need for continuous supervision: Secondary | ICD-10-CM | POA: Diagnosis not present

## 2022-12-28 DIAGNOSIS — E860 Dehydration: Secondary | ICD-10-CM | POA: Diagnosis not present

## 2022-12-28 DIAGNOSIS — R6889 Other general symptoms and signs: Secondary | ICD-10-CM | POA: Diagnosis not present

## 2022-12-28 DIAGNOSIS — M545 Low back pain, unspecified: Secondary | ICD-10-CM | POA: Diagnosis not present

## 2022-12-28 DIAGNOSIS — R29818 Other symptoms and signs involving the nervous system: Secondary | ICD-10-CM | POA: Diagnosis not present

## 2022-12-28 MED ORDER — LORAZEPAM 2 MG/ML IJ SOLN
0.5000 mg | Freq: Once | INTRAMUSCULAR | Status: AC
  Administered 2022-12-28: 0.5 mg via INTRAVENOUS
  Filled 2022-12-28: qty 1

## 2022-12-28 NOTE — ED Notes (Signed)
Pt requested coffee and food. She was told that she would have to wait for results from MRI to have anything PO. Pt left AMA after that. She was assisted to the lobby to ensure that she did not fall.

## 2022-12-28 NOTE — ED Notes (Signed)
Plan for MRI time discussed with MRI staff. MRI is planned to be around 0945-1000. Ativan will be administered at approximately 0930 for MRI.

## 2022-12-28 NOTE — ED Triage Notes (Signed)
Pt via [facility] c/o bilateral leg weakness. Seen here yesterday for same. Going on x5 days. Ambulatory to stretcher.   IV 20 R AC

## 2022-12-28 NOTE — ED Provider Notes (Signed)
[facility] Provider Note   CSN: [id number] Arrival date & time: 12/28/22  [id number]     History {Add pertinent medical, surgical, social history, OB history to HPI:1} No chief complaint on file.   [name] is a 72 y.o. female.  Patient has a history of EtOH abuse.  She presents with lower extremity weakness for a number of days.  She was seen yesterday and had lab work that was unremarkable but she left AMA before her MRI was done   Weakness      Home Medications Prior to Admission medications   Medication Sig Start Date End Date Taking? Authorizing Provider  albuterol (PROVENTIL) (2.5 MG/3ML) 0.083% nebulizer solution Take 2.5 mg by nebulization every Monday, Wednesday, and Friday.    [provider]  albuterol (VENTOLIN HFA) 108 (90 Base) MCG/ACT inhaler Inhale 2 puffs into the lungs every 6 (six) hours as needed for wheezing or shortness of breath.    [provider]  Aromatic Inhalants (VICKS VAPOINHALER IN) Inhale 1 puff into the lungs daily as needed (congestion).    [provider]  atorvastatin (LIPITOR) 20 MG tablet Take 20 mg by mouth every evening.  01/08/19   [provider]  carboxymethylcellulose (REFRESH PLUS) 0.5 % SOLN Place 1 drop into both eyes 3 (three) times daily as needed (dry/irritated eyes.).    [provider]  clonazePAM (KLONOPIN) 1 MG tablet Take 1 tablet (1 mg total) by mouth 3 (three) times daily. Patient taking differently: Take 1 mg by mouth 4 (four) times daily as needed for anxiety. 04/26/19   Barton Dubois, MD  dicyclomine (BENTYL) 10 MG capsule Take 10 mg by mouth 2 (two) times daily.    [provider]  fluconazole (DIFLUCAN) 100 MG tablet Take 2 tablets ('200mg'$ ) the first day and then 1 tablet once a day for the next 21 days. Patient not taking: Reported on 08/15/2022 07/06/22   Daneil Dolin, MD  gabapentin (NEURONTIN) 300 MG capsule TAKE (1)  CAPSULE BY MOUTH THREE TIMES DAILY 10/30/22   Derek Jack, MD  hydrOXYzine (ATARAX) 25 MG tablet Take 25 mg by mouth 2 (two) times daily. 09/10/22   [provider]  Multiple Vitamin (MULTIVITAMIN WITH MINERALS) TABS tablet Take 1 tablet by mouth 3 (three) times a week.    [provider]  omeprazole (PRILOSEC) 40 MG capsule Take 40 mg by mouth daily.  10/04/16   [provider]  phenytoin (DILANTIN) 100 MG ER capsule Take 400 mg by mouth at bedtime.    [provider]  STIOLTO RESPIMAT 2.5-2.5 MCG/ACT AERS Inhale 2 puffs into the lungs daily. 06/25/22   [provider]  tamoxifen (NOLVADEX) 20 MG tablet TAKE ONE TABLET BY MOUTH ONCE DAILY. 11/15/22   Derek Jack, MD  tiZANidine (ZANAFLEX) 4 MG tablet Take 4 mg by mouth 2 (two) times daily. 08/22/18   [provider]  traZODone (DESYREL) 150 MG tablet Take 225 mg by mouth at bedtime.    [provider]      Allergies    Varenicline, Codeine, Influenza virus vaccine, Lithium, and Haemophilus b polysaccharide vaccine    Review of Systems   Review of Systems  Neurological:  Positive for weakness.    Physical Exam Updated Vital Signs BP (!) 162/82 (BP Location: Right Arm)   Pulse 75   Temp 98.5 F (36.9 C) (Oral)   Resp 20   SpO2 100%  Physical Exam  ED Results / Procedures / Treatments   Labs (all labs ordered are listed, but only abnormal results are displayed) Labs Reviewed - No data to display  EKG None  Radiology MR LUMBAR SPINE WO CONTRAST  Result Date: 12/28/2022 CLINICAL DATA:  Low back pain EXAM: MRI LUMBAR SPINE WITHOUT CONTRAST TECHNIQUE: Multiplanar, multisequence MR imaging of the lumbar spine was performed. No intravenous contrast was administered. COMPARISON:  None Available. FINDINGS: Segmentation:  Standard. Alignment:  Grade 1 anterolisthesis of L4 on L5. Vertebrae:  No fracture, evidence of discitis, or bone lesion. Conus medullaris and  cauda equina: Conus extends to the L2 level. Conus and cauda equina appear normal. Paraspinal and other soft tissues: Common bile duct is dilated measuring up to 9 mm. This was likely present on prior CTA chest angiogram dated 05/08/2020 Disc levels: T12-L1: Mild bilateral facet degenerative change. Minimal disc bulge. No spinal canal narrowing. No neural foraminal narrowing. L1-L2: Mild bilateral facet degenerative change. No significant disc bulge. No spinal canal narrowing. No neural foraminal narrowing. L2-L3: Moderate bilateral facet degenerative change. Minimal disc bulge. Mild spinal canal narrowing. No neural foraminal narrowing. L3-L4: Moderate bilateral facet degenerative change with ligamentum flavum hypertrophy. Minimal disc bulge. Mild spinal canal narrowing. Mild right neural foraminal narrowing. L4-L5: Severe bilateral facet degenerative change. Eccentric right disc bulge. Mild overall spinal canal narrowing. Moderate narrowing of the right lateral recess. Mild bilateral neural foraminal narrowing. L5-S1: Mild bilateral facet degenerative change. No significant disc bulge. No spinal canal narrowing. Moderate left and mild right neural foraminal narrowing. IMPRESSION: 1. Mild spinal canal narrowing at L2-L3, L3-L4, and L4-L5. 2. Moderate left neural foraminal narrowing at L5-S1. Electronically Signed   By: Marin Roberts M.D.   On: 12/28/2022 11:23   MR BRAIN WO CONTRAST  Result Date: 12/28/2022 CLINICAL DATA:  Neuro deficit, acute, stroke suspected. EXAM: MRI HEAD WITHOUT CONTRAST TECHNIQUE: Multiplanar, multiecho pulse sequences of the brain and surrounding structures were obtained without intravenous contrast. COMPARISON:  Head CT 11/29/2013. FINDINGS: Brain: No acute infarct or hemorrhage. Mild chronic small-vessel disease. No mass or midline shift. No hydrocephalus or extra-axial collection. Basilar cisterns are patent. Vascular: Normal flow voids. Skull and upper cervical spine: Normal marrow  signal. Sinuses/Orbits: Unremarkable. Other: None. IMPRESSION: No acute intracranial abnormality or mass. Electronically Signed   By: Emmit Alexanders M.D.   On: 12/28/2022 10:37    Procedures Procedures  {Document cardiac monitor, telemetry assessment procedure when appropriate:1}  Medications Ordered in ED Medications  LORazepam (ATIVAN) injection 0.5 mg (0.5 mg Intravenous Given 12/28/22 [id number])    ED Course/ Medical Decision Making/ A&P   {   Click here for ABCD2, HEART and other calculatorsREFRESH Note before signing :1}                          Medical Decision Making Amount and/or Complexity of Data Reviewed Radiology: ordered.  Risk Prescription drug management.   Patient with lower extremity weakness.  She had an MRI of her lumbar spine and her head.  The head MRI was unremarkable.  She did have some spinal canal narrowing in her lumbar spine.  Patient left AMA before I was able to discuss the results  {Document critical care time when appropriate:1} {Document review of labs and clinical decision tools ie heart score, Chads2Vasc2 etc:1}  {Document your independent review of radiology images, and any outside records:1} {Document your discussion with family members, caretakers, and with consultants:1} {Document social determinants of health  affecting pt's care:1} {Document your decision making why or why not admission, treatments were needed:1} Final Clinical Impression(s) / ED Diagnoses Final diagnoses:  Weakness    Rx / DC Orders ED Discharge Orders     None

## 2022-12-30 ENCOUNTER — Emergency Department (HOSPITAL_COMMUNITY)
Admission: EM | Admit: 2022-12-30 | Discharge: 2022-12-30 | Discharge status: Against Medical Advice | Payer: 59 | Attending: Student | Admitting: Student

## 2022-12-30 ENCOUNTER — Other Ambulatory Visit: Payer: Self-pay

## 2022-12-30 ENCOUNTER — Encounter (HOSPITAL_COMMUNITY): Payer: Self-pay

## 2022-12-30 DIAGNOSIS — J449 Chronic obstructive pulmonary disease, unspecified: Secondary | ICD-10-CM | POA: Diagnosis not present

## 2022-12-30 DIAGNOSIS — R531 Weakness: Secondary | ICD-10-CM | POA: Insufficient documentation

## 2022-12-30 DIAGNOSIS — Z5329 Procedure and treatment not carried out because of patient's decision for other reasons: Secondary | ICD-10-CM | POA: Insufficient documentation

## 2022-12-30 DIAGNOSIS — Z853 Personal history of malignant neoplasm of breast: Secondary | ICD-10-CM | POA: Insufficient documentation

## 2022-12-30 DIAGNOSIS — Z7951 Long term (current) use of inhaled steroids: Secondary | ICD-10-CM | POA: Insufficient documentation

## 2022-12-30 DIAGNOSIS — F439 Reaction to severe stress, unspecified: Secondary | ICD-10-CM | POA: Insufficient documentation

## 2022-12-30 DIAGNOSIS — F17210 Nicotine dependence, cigarettes, uncomplicated: Secondary | ICD-10-CM | POA: Insufficient documentation

## 2022-12-30 DIAGNOSIS — Z743 Need for continuous supervision: Secondary | ICD-10-CM | POA: Diagnosis not present

## 2022-12-30 NOTE — ED Notes (Signed)
Pt's husband arrived and pt taken out of department via wheelchair;

## 2022-12-30 NOTE — ED Notes (Signed)
Called husband x 3 attempts to pick up his wife. No response.

## 2022-12-30 NOTE — ED Notes (Signed)
Left a message for husband to come pick up the patient

## 2022-12-30 NOTE — ED Triage Notes (Signed)
Patient arrives to ED via [facility], c/o leg weakness for several weeks. Seen in ER on yesterday for same symptoms. Reports she had a  MRI and wanting to know the results of her test results. Denies any pain to her legs or pain at the back of her calf.

## 2022-12-30 NOTE — ED Provider Notes (Signed)
[facility] Provider Note  CSN: [id number] Arrival date & time: 12/30/22 1548  Chief Complaint(s) Extremity Weakness  HPI [name] is a 72 y.o. female with PMH bipolar disorder, COPD, gastric ulcer, previous history of alcohol use but patient reports alcohol abstinence for more than a decade, previous CVA who presents emergency room for evaluation of extremity weakness.  Patient states that she has been under a lot of stress at home and her son recently went to jail and she states that she cannot get around at home.  She has been seen in the emergency department twice since 12/27/2022 and received MRI brain and L-spine both of which did not show acute pathology.  Both times, the patient left AMA for an unknown reason.  Patient will not elaborate on these reasons when I ask about this here in the emergency department.  Currently denies any chest pain, shortness of breath, abdominal pain, nausea, vomiting, back pain or other systemic symptoms.   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[place])    S/P right hip fracture    Acute blood loss as cause of postoperative anemia    Closed fracture of right hip (HCC) s/p compression fixation 04/25/19 04/24/2019   Leukocytosis 04/24/2019   COPD (chronic obstructive pulmonary disease) ([place]) 05/30/2017   Colitis 08/21/2016   Adrenal mass, left ([place]) 08/21/2016   Solitary pulmonary nodule  08/21/2016   Tobacco use disorder 08/21/2016   GERD (gastroesophageal reflux disease) 06/13/2010   IRRITABLE BOWEL SYNDROME 06/13/2010   HEMATOCHEZIA 06/13/2010   WEIGHT LOSS, ABNORMAL 06/13/2010   ABDOMINAL PAIN, GENERALIZED 06/13/2010   Home Medication(s) Prior to Admission medications   Medication Sig Start Date End Date Taking? Authorizing Provider  albuterol (PROVENTIL) (2.5 MG/3ML) 0.083% nebulizer solution Take 2.5 mg by nebulization every Monday, Wednesday, and Friday.    [provider]  albuterol (VENTOLIN HFA) 108 (90 Base) MCG/ACT inhaler Inhale 2 puffs into the lungs every 6 (six) hours as needed for wheezing or shortness of breath.    [provider]  Aromatic Inhalants (VICKS VAPOINHALER IN) Inhale 1 puff into the lungs daily as needed (congestion).    [provider]  atorvastatin (LIPITOR) 20 MG tablet Take 20 mg by mouth every evening.  01/08/19   [provider]  carboxymethylcellulose (REFRESH PLUS) 0.5 % SOLN Place 1 drop into both eyes 3 (three) times daily as needed (dry/irritated eyes.).    [provider]  clonazePAM (KLONOPIN) 1 MG tablet Take 1 tablet (1 mg total) by mouth 3 (three) times daily. Patient taking differently: Take 1 mg by mouth 4 (four) times daily as needed for anxiety. 04/26/19   Barton Dubois, MD  dicyclomine (BENTYL) 10 MG capsule Take 10 mg by mouth 2 (two) times daily.    [provider]  fluconazole (DIFLUCAN) 100  MG tablet Take 2 tablets ('200mg'$ ) the first day and then 1 tablet once a day for the next 21 days. Patient not taking: Reported on 08/15/2022 07/06/22   Daneil Dolin, MD  gabapentin (NEURONTIN) 300 MG capsule TAKE (1) CAPSULE BY MOUTH THREE TIMES DAILY 10/30/22   Derek Jack, MD  hydrOXYzine (ATARAX) 25 MG tablet Take 25 mg by mouth 2 (two) times daily. 09/10/22   [provider]  Multiple Vitamin (MULTIVITAMIN WITH MINERALS) TABS tablet Take 1 tablet by mouth 3  (three) times a week.    [provider]  omeprazole (PRILOSEC) 40 MG capsule Take 40 mg by mouth daily.  10/04/16   [provider]  phenytoin (DILANTIN) 100 MG ER capsule Take 400 mg by mouth at bedtime.    [provider]  STIOLTO RESPIMAT 2.5-2.5 MCG/ACT AERS Inhale 2 puffs into the lungs daily. 06/25/22   [provider]  tamoxifen (NOLVADEX) 20 MG tablet TAKE ONE TABLET BY MOUTH ONCE DAILY. 11/15/22   Derek Jack, MD  tiZANidine (ZANAFLEX) 4 MG tablet Take 4 mg by mouth 2 (two) times daily. 08/22/18   [provider]  traZODone (DESYREL) 150 MG tablet Take 225 mg by mouth at bedtime.    [provider]                                                                                                                                    Past Surgical History Past Surgical History:  Procedure Laterality Date   ABDOMINAL HYSTERECTOMY     BIOPSY  09/13/2022   Procedure: BIOPSY;  Surgeon: Eloise Harman, DO;  [facility];  Service: Endoscopy;;   CHOLECYSTECTOMY     COLONOSCOPY WITH PROPOFOL N/A 09/13/2022   Procedure: COLONOSCOPY WITH PROPOFOL;  Surgeon: Eloise Harman, DO;  Location: [facility];  Service: Endoscopy;  Laterality: N/A;  11:15 am   COMPRESSION HIP SCREW Right 04/25/2019   Procedure: COMPRESSION HIP;  Surgeon: Carole Civil, MD;  Location: [facility];  Service: Orthopedics;  Laterality: Right;  1030am   ESOPHAGEAL BRUSHING  07/04/2022   Procedure: ESOPHAGEAL BRUSHING;  Surgeon: Daneil Dolin, MD;  [facility];  Service: Endoscopy;;   ESOPHAGOGASTRODUODENOSCOPY (EGD) WITH PROPOFOL N/A 07/04/2022   Procedure: ESOPHAGOGASTRODUODENOSCOPY (EGD) WITH PROPOFOL;  Surgeon: Daneil Dolin, MD;  [facility];  Service: Endoscopy;  Laterality: N/A;  ESOPHAGOGASTRODUODENOSCOPY WITH FOOD IMPACTION REMOVAL   ESOPHAGOGASTRODUODENOSCOPY (EGD) WITH PROPOFOL N/A 09/13/2022   Procedure:  ESOPHAGOGASTRODUODENOSCOPY (EGD) WITH PROPOFOL;  Surgeon: Eloise Harman, DO;  [facility];  Service: Endoscopy;  Laterality: N/A;   EYE SURGERY Bilateral    cataract removal   MASTECTOMY, PARTIAL Right 07/20/2020   Procedure: MASTECTOMY PARTIAL;  Surgeon: Aviva Signs, MD;  Location: [facility];  Service: General;  Laterality: Right;   PARTIAL MASTECTOMY WITH AXILLARY SENTINEL LYMPH NODE BIOPSY Right 07/06/2020  Procedure: RIGHT PARTIAL MASTECTOMY WITH AXILLARY SENTINEL LYMPH NODE BIOPSY;  Surgeon: Aviva Signs, MD;  Location: [facility];  Service: General;  Laterality: Right;   POLYPECTOMY  09/13/2022   Procedure: POLYPECTOMY;  Surgeon: Eloise Harman, DO;  [facility];  Service: Endoscopy;;   Family History Family History  Problem Relation Age of Onset   Dementia Father     Social History Social History   Tobacco Use   Smoking status: Every Day    Packs/day: 0.50    Years: 32.00    Total pack years: 16.00    Types: Cigarettes   Smokeless tobacco: Never  Vaping Use   Vaping Use: Some days  Substance Use Topics   Alcohol use: No    Comment: h/o heavy use, quit in 1995   Drug use: Yes    Types: Marijuana    Comment: sometimes   Allergies Varenicline, Codeine, Influenza virus vaccine, Lithium, and Haemophilus b polysaccharide vaccine  Review of Systems Review of Systems  Neurological:  Positive for weakness.    Physical Exam Vital Signs  I have reviewed the triage vital signs Ht 5' 3.5" (1.613 m)   Wt 54.9 kg   BMI 21.10 kg/m   Physical Exam Vitals and nursing note reviewed.  Constitutional:      General: She is not in acute distress.    Appearance: She is well-developed.  HENT:     Head: Normocephalic and atraumatic.  Eyes:     Conjunctiva/sclera: Conjunctivae normal.  Cardiovascular:     Rate and Rhythm: Normal rate and regular rhythm.     Heart sounds: No murmur heard. Pulmonary:     Effort: Pulmonary effort is normal. No  respiratory distress.     Breath sounds: Normal breath sounds.  Abdominal:     Palpations: Abdomen is soft.     Tenderness: There is no abdominal tenderness.  Musculoskeletal:        General: No swelling.     Cervical back: Neck supple.  Skin:    General: Skin is warm and dry.     Capillary Refill: Capillary refill takes less than 2 seconds.  Neurological:     Mental Status: She is alert.  Psychiatric:        Mood and Affect: Mood normal.     ED Results and Treatments Labs (all labs ordered are listed, but only abnormal results are displayed) Labs Reviewed - No data to display                                                                                                                        Radiology No results found.  Pertinent labs & imaging results that were available during my care of the patient were reviewed by me and considered in my medical decision making (see MDM for details).  Medications Ordered in ED Medications - No data to display  Procedures Procedures  (including critical care time)  Medical Decision Making / ED Course   This patient presents to the ED for concern of generalized weakness, this involves an extensive number of treatment options, and is a complaint that carries with it a high risk of complications and morbidity.  The differential diagnosis includes deconditioning, substance use, electrolyte abnormality, paraneoplastic syndrome, dehydration, hypothyroidism  MDM: Patient seen emergency room for evaluation of generalized weakness.  Physical exam is largely unremarkable with an unremarkable neurologic exam with no focal motor or sensory deficits.  Almost immediately upon hearing the results of her MRI, patient states "I am not going to learn anything from you and I want to go home".  I attempted multiple times to talk  with the patient about her weakness and discussed the risks of not addressing this weakness including falling at home, missing underlying critical pathology, morbidity and death.  Patient states that she understands these risks but is not interested in talking further with me today.  She is refusing any blood work or imaging today and is demanding that I call her husband to come pick her up.  I called the husband multiple times attempting to get collateral information but was unable to reach husband.  Patient ultimately left the emergency department AGAINST MEDICAL ADVICE once nursing staff was able to contact husband to come pick patient up.   Additional history obtained:  -External records from outside source obtained and reviewed including: Chart review including previous notes, labs, imaging, consultation notes   Medicines ordered and prescription drug management: No orders of the defined types were placed in this encounter.   -I have reviewed the patients home medicines and have made adjustments as needed  Critical interventions none   Cardiac Monitoring: The patient was maintained on a cardiac monitor.  I personally viewed and interpreted the cardiac monitored which showed an underlying rhythm of: NSR, no ST elevations or depressions  Social Determinants of Health:  Factors impacting patients care include: none   Reevaluation: After the interventions noted above, I reevaluated the patient and found that they have :stayed the same  Co morbidities that complicate the patient evaluation  Past Medical History:  Diagnosis Date   Alcohol abuse, in remission    Anxiety    Arthritis    Bipolar disorder ([place])    Colitis    COPD (chronic obstructive pulmonary disease) (HCC)    occasional home O2   Gastric ulcer    GERD (gastroesophageal reflux disease)    Hemorrhoids    IBS (irritable bowel syndrome)    Seizures ([place])    last seizure was about a year ago   Stroke [facility])     right sided weakness-slight      Dispostion: I considered admission for this patient, but the patient refused all medical care while in the emergency department and left [facility]     Final Clinical Impression(s) / ED Diagnoses Final diagnoses:  Weakness     '@PCDICTATION'$ @    Devonn Giampietro, Debe Coder, MD 12/31/22 1049

## 2023-01-27 DIAGNOSIS — K219 Gastro-esophageal reflux disease without esophagitis: Secondary | ICD-10-CM | POA: Diagnosis not present

## 2023-01-27 DIAGNOSIS — J449 Chronic obstructive pulmonary disease, unspecified: Secondary | ICD-10-CM | POA: Diagnosis not present

## 2023-01-27 DIAGNOSIS — I10 Essential (primary) hypertension: Secondary | ICD-10-CM | POA: Diagnosis not present

## 2023-02-06 ENCOUNTER — Other Ambulatory Visit: Payer: Self-pay | Admitting: Hematology

## 2023-02-25 ENCOUNTER — Other Ambulatory Visit: Payer: Self-pay | Admitting: Physician Assistant

## 2023-03-27 DIAGNOSIS — I10 Essential (primary) hypertension: Secondary | ICD-10-CM | POA: Diagnosis not present

## 2023-03-27 DIAGNOSIS — R0789 Other chest pain: Secondary | ICD-10-CM | POA: Diagnosis not present

## 2023-03-31 ENCOUNTER — Emergency Department (HOSPITAL_COMMUNITY): Payer: 59

## 2023-03-31 ENCOUNTER — Other Ambulatory Visit: Payer: Self-pay

## 2023-03-31 ENCOUNTER — Emergency Department (HOSPITAL_COMMUNITY)
Admission: EM | Admit: 2023-03-31 | Discharge: 2023-03-31 | Disposition: A | Discharge status: Home / Self Care | Payer: 59 | Attending: Emergency Medicine | Admitting: Emergency Medicine

## 2023-03-31 ENCOUNTER — Encounter (HOSPITAL_COMMUNITY): Payer: Self-pay | Admitting: Emergency Medicine

## 2023-03-31 DIAGNOSIS — Z7952 Long term (current) use of systemic steroids: Secondary | ICD-10-CM | POA: Insufficient documentation

## 2023-03-31 DIAGNOSIS — J439 Emphysema, unspecified: Secondary | ICD-10-CM | POA: Diagnosis not present

## 2023-03-31 DIAGNOSIS — Z7951 Long term (current) use of inhaled steroids: Secondary | ICD-10-CM | POA: Diagnosis not present

## 2023-03-31 DIAGNOSIS — J441 Chronic obstructive pulmonary disease with (acute) exacerbation: Secondary | ICD-10-CM | POA: Insufficient documentation

## 2023-03-31 DIAGNOSIS — J449 Chronic obstructive pulmonary disease, unspecified: Secondary | ICD-10-CM | POA: Diagnosis not present

## 2023-03-31 DIAGNOSIS — Z743 Need for continuous supervision: Secondary | ICD-10-CM | POA: Diagnosis not present

## 2023-03-31 DIAGNOSIS — R6889 Other general symptoms and signs: Secondary | ICD-10-CM | POA: Diagnosis not present

## 2023-03-31 DIAGNOSIS — R062 Wheezing: Secondary | ICD-10-CM | POA: Diagnosis not present

## 2023-03-31 DIAGNOSIS — R0602 Shortness of breath: Secondary | ICD-10-CM | POA: Diagnosis not present

## 2023-03-31 DIAGNOSIS — R0689 Other abnormalities of breathing: Secondary | ICD-10-CM | POA: Diagnosis not present

## 2023-03-31 LAB — BASIC METABOLIC PANEL
Anion gap: 9 (ref 5–15)
BUN: 6 mg/dL — ABNORMAL LOW (ref 8–23)
CO2: 23 mmol/L (ref 22–32)
Calcium: 8.4 mg/dL — ABNORMAL LOW (ref 8.9–10.3)
Chloride: 102 mmol/L (ref 98–111)
Creatinine, Ser: 0.85 mg/dL (ref 0.44–1.00)
GFR, Estimated: >60 mL/min (ref >60)
Glucose, Bld: 122 mg/dL — ABNORMAL HIGH (ref 70–99)
Potassium: 3.7 mmol/L (ref 3.5–5.1)
Sodium: 134 mmol/L — ABNORMAL LOW (ref 135–145)

## 2023-03-31 LAB — CBC WITH DIFFERENTIAL/PLATELET
Abs Immature Granulocytes: 0.02 10*3/uL (ref 0.00–0.07)
Basophils Absolute: 0.1 10*3/uL (ref 0.0–0.1)
Basophils Relative: 1 %
Eosinophils Absolute: 0.1 10*3/uL (ref 0.0–0.5)
Eosinophils Relative: 2 %
HCT: 39.5 % (ref 36.0–46.0)
Hemoglobin: 13.5 g/dL (ref 12.0–15.0)
Immature Granulocytes: 0 %
Lymphocytes Relative: 42 %
Lymphs Abs: 3.1 10*3/uL (ref 0.7–4.0)
MCH: 31.8 pg (ref 26.0–34.0)
MCHC: 34.2 g/dL (ref 30.0–36.0)
MCV: 92.9 fL (ref 80.0–100.0)
Monocytes Absolute: 0.8 10*3/uL (ref 0.1–1.0)
Monocytes Relative: 10 %
Neutro Abs: 3.3 10*3/uL (ref 1.7–7.7)
Neutrophils Relative %: 45 %
Platelets: 247 10*3/uL (ref 150–400)
RBC: 4.25 MIL/uL (ref 3.87–5.11)
RDW: 13 % (ref 11.5–15.5)
WBC: 7.4 10*3/uL (ref 4.0–10.5)
nRBC: 0 % (ref 0.0–0.2)

## 2023-03-31 MED ORDER — METHYLPREDNISOLONE SODIUM SUCC 125 MG IJ SOLR
125.0000 mg | Freq: Once | INTRAMUSCULAR | Status: AC
  Administered 2023-03-31: 125 mg via INTRAVENOUS
  Filled 2023-03-31: qty 2

## 2023-03-31 MED ORDER — PREDNISONE 10 MG (21) PO TBPK: ORAL_TABLET | ORAL | 0 refills | Status: DC

## 2023-03-31 NOTE — ED Provider Notes (Signed)
[place] EMERGENCY DEPARTMENT AT [facility]  Provider Note  CSN: [id number] Arrival date & time: 03/31/23 0054  History Chief Complaint  Patient presents with   Shortness of Breath    [name] is a 72 y.o. female with history of COPD brought to the ED via EMS for evaluation of wheezing and SOB, worsening today. EMS reports she had used her nebulizer x 3 at home with minimal improvement. Given duoneb enroute. She denies chest pain or fevers. She also reports she is out of her Klonopin.    Home Medications Prior to Admission medications   Medication Sig Start Date End Date Taking? Authorizing Provider  predniSONE (STERAPRED UNI-PAK 21 TAB) 10 MG (21) TBPK tablet 10mg  Tabs, 6 day taper. Use as directed 03/31/23  Yes Pollyann Savoy, MD  albuterol (PROVENTIL) (2.5 MG/3ML) 0.083% nebulizer solution Take 2.5 mg by nebulization every Monday, Wednesday, and Friday.    [provider]  albuterol (VENTOLIN HFA) 108 (90 Base) MCG/ACT inhaler Inhale 2 puffs into the lungs every 6 (six) hours as needed for wheezing or shortness of breath.    [provider]  Aromatic Inhalants (VICKS VAPOINHALER IN) Inhale 1 puff into the lungs daily as needed (congestion).    [provider]  atorvastatin (LIPITOR) 20 MG tablet Take 20 mg by mouth every evening.  01/08/19   [provider]  carboxymethylcellulose (REFRESH PLUS) 0.5 % SOLN Place 1 drop into both eyes 3 (three) times daily as needed (dry/irritated eyes.).    [provider]  clonazePAM (KLONOPIN) 1 MG tablet Take 1 tablet (1 mg total) by mouth 3 (three) times daily. Patient taking differently: Take 1 mg by mouth 4 (four) times daily as needed for anxiety. 04/26/19   Vassie Loll, MD  dicyclomine (BENTYL) 10 MG capsule Take 10 mg by mouth 2 (two) times daily.    [provider]  fluconazole (DIFLUCAN) 100 MG tablet Take 2 tablets (200mg ) the first day and then 1 tablet once a day  for the next 21 days. Patient not taking: Reported on 08/15/2022 07/06/22   Corbin Ade, MD  gabapentin (NEURONTIN) 300 MG capsule TAKE (1) CAPSULE BY MOUTH THREE TIMES DAILY 02/06/23   Doreatha Massed, MD  hydrOXYzine (ATARAX) 25 MG tablet Take 25 mg by mouth 2 (two) times daily. 09/10/22   [provider]  Multiple Vitamin (MULTIVITAMIN WITH MINERALS) TABS tablet Take 1 tablet by mouth 3 (three) times a week.    [provider]  omeprazole (PRILOSEC) 40 MG capsule Take 40 mg by mouth daily.  10/04/16   [provider]  phenytoin (DILANTIN) 100 MG ER capsule Take 400 mg by mouth at bedtime.    [provider]  STIOLTO RESPIMAT 2.5-2.5 MCG/ACT AERS Inhale 2 puffs into the lungs daily. 06/25/22   [provider]  tamoxifen (NOLVADEX) 20 MG tablet TAKE ONE TABLET BY MOUTH ONCE DAILY. 11/15/22   Doreatha Massed, MD  tiZANidine (ZANAFLEX) 4 MG tablet Take 4 mg by mouth 2 (two) times daily. 08/22/18   [provider]  traZODone (DESYREL) 150 MG tablet Take 225 mg by mouth at bedtime.    [provider]     Allergies    Varenicline, Codeine, Influenza virus vaccine, Lithium, and Haemophilus b polysaccharide vaccine   Review of Systems   Review of Systems Please see HPI for pertinent positives and negatives  Physical Exam BP 138/61   Pulse 80   Temp 98.1 F (  36.7 C) (Oral)   Resp (!) 22   Ht 5' 3.5" (1.613 m)   Wt 55 kg   SpO2 94%   BMI 21.14 kg/m   Physical Exam Vitals and nursing note reviewed.  Constitutional:      Appearance: Normal appearance.  HENT:     Head: Normocephalic and atraumatic.     Nose: Nose normal.     Mouth/Throat:     Mouth: Mucous membranes are moist.  Eyes:     Extraocular Movements: Extraocular movements intact.     Conjunctiva/sclera: Conjunctivae normal.  Cardiovascular:     Rate and Rhythm: Normal rate.  Pulmonary:     Effort: Pulmonary effort is normal.     Breath sounds:  Rhonchi present. No wheezing.  Abdominal:     General: Abdomen is flat.     Palpations: Abdomen is soft.     Tenderness: There is no abdominal tenderness.  Musculoskeletal:        General: No swelling. Normal range of motion.     Cervical back: Neck supple.     Right lower leg: No edema.     Left lower leg: No edema.  Skin:    General: Skin is warm and dry.  Neurological:     General: No focal deficit present.     Mental Status: She is alert.  Psychiatric:        Mood and Affect: Mood normal.     ED Results / Procedures / Treatments   EKG EKG Interpretation  Date/Time:  Sunday March 31 2023 01:04:09 EDT Ventricular Rate:  79 PR Interval:  158 QRS Duration: 94 QT Interval:  379 QTC Calculation: 435 R Axis:   87 Text Interpretation: Sinus rhythm Left atrial enlargement Borderline right axis deviation Nonspecific T abnrm, anterolateral leads No significant change since last tracing Confirmed by Susy Frizzle [phone]) on 03/31/2023 1:12:14 AM  Procedures Procedures  Medications Ordered in the ED Medications  methylPREDNISolone sodium succinate (SOLU-MEDROL) 125 mg/2 mL injection 125 mg (125 mg Intravenous Given 03/31/23 0108)    Initial Impression and Plan  Patient here with SOB, likely COPD exacerbation. Wheezing improved on arrival after Duoneb with EMS. She does not routinely wear home oxygen. No reported hypoxia from EMS. Will check labs, CXR. Solumedrol and nebs as needed. Patient advised we cannot refill her Klonopin, last had 120 tabs filled on 5/13.   ED Course   Clinical Course as of 03/31/23 0237  Wynelle Link Mar 31, 2023  0112 CBC is normal.  [CS]  0117 BMP is unremarkable.  [CS]  0205 I personally viewed the images from radiology studies and agree with radiologist interpretation: CXR neg for acute process.  [CS]  0236 Patient feeling better, no wheezing on re-exam and no hypoxia on room air. She is ready to go home. She has plenty of neb vials. Plan rx for prednisone.  PCP follow up, RTED for any other concerns.   [CS]    Clinical Course User Index [CS] Pollyann Savoy, MD     MDM Rules/Calculators/A&P Medical Decision Making Given presenting complaint, I considered that admission might be necessary. After review of results from ED lab and/or imaging studies, admission to the hospital is not indicated at this time.    Problems Addressed: COPD exacerbation (HCC): acute illness or injury  Amount and/or Complexity of Data Reviewed Labs: ordered. Decision-making details documented in ED Course. Radiology: ordered and independent interpretation performed. Decision-making details documented in ED Course. ECG/medicine tests: ordered and independent interpretation  performed. Decision-making details documented in ED Course.  Risk Prescription drug management. Decision regarding hospitalization.     Final Clinical Impression(s) / ED Diagnoses Final diagnoses:  COPD exacerbation (HCC)    Rx / DC Orders ED Discharge Orders          Ordered    predniSONE (STERAPRED UNI-PAK 21 TAB) 10 MG (21) TBPK tablet        03/31/23 0237             Pollyann Savoy, MD 03/31/23 [phone]

## 2023-03-31 NOTE — ED Notes (Signed)
Pt attempting to call for a ride. Per pts request PIV and monitor removed. Pt also given sprite to drink.

## 2023-03-31 NOTE — ED Triage Notes (Signed)
Pt with increased SOB for "awhile". Pt reported to EMS that she took 3 albuterol nebs PTA without any relief of symptoms. EMS gave pt a Duoneb en-route. EMS stated that pt had wheezes throughout.

## 2023-04-15 DIAGNOSIS — J449 Chronic obstructive pulmonary disease, unspecified: Secondary | ICD-10-CM | POA: Diagnosis not present

## 2023-04-25 DIAGNOSIS — Z6821 Body mass index (BMI) 21.0-21.9, adult: Secondary | ICD-10-CM | POA: Diagnosis not present

## 2023-04-25 DIAGNOSIS — J449 Chronic obstructive pulmonary disease, unspecified: Secondary | ICD-10-CM | POA: Diagnosis not present

## 2023-04-25 DIAGNOSIS — M2550 Pain in unspecified joint: Secondary | ICD-10-CM | POA: Diagnosis not present

## 2023-04-25 DIAGNOSIS — C50211 Malignant neoplasm of upper-inner quadrant of right female breast: Secondary | ICD-10-CM | POA: Diagnosis not present

## 2023-04-25 DIAGNOSIS — G894 Chronic pain syndrome: Secondary | ICD-10-CM | POA: Diagnosis not present

## 2023-07-11 DIAGNOSIS — H04123 Dry eye syndrome of bilateral lacrimal glands: Secondary | ICD-10-CM | POA: Diagnosis not present

## 2023-07-24 DIAGNOSIS — I10 Essential (primary) hypertension: Secondary | ICD-10-CM | POA: Diagnosis not present

## 2023-07-24 DIAGNOSIS — Z6821 Body mass index (BMI) 21.0-21.9, adult: Secondary | ICD-10-CM | POA: Diagnosis not present

## 2023-07-24 DIAGNOSIS — M1991 Primary osteoarthritis, unspecified site: Secondary | ICD-10-CM | POA: Diagnosis not present

## 2023-07-24 DIAGNOSIS — J449 Chronic obstructive pulmonary disease, unspecified: Secondary | ICD-10-CM | POA: Diagnosis not present

## 2023-08-29 DIAGNOSIS — I10 Essential (primary) hypertension: Secondary | ICD-10-CM | POA: Diagnosis not present

## 2023-08-29 DIAGNOSIS — J449 Chronic obstructive pulmonary disease, unspecified: Secondary | ICD-10-CM | POA: Diagnosis not present

## 2023-08-29 DIAGNOSIS — G894 Chronic pain syndrome: Secondary | ICD-10-CM | POA: Diagnosis not present

## 2023-09-10 ENCOUNTER — Other Ambulatory Visit: Payer: Self-pay

## 2023-09-10 ENCOUNTER — Emergency Department (HOSPITAL_COMMUNITY)
Admission: EM | Admit: 2023-09-10 | Discharge: 2023-09-10 | Disposition: A | Discharge status: Home / Self Care | Payer: 59 | Attending: Student | Admitting: Student

## 2023-09-10 ENCOUNTER — Emergency Department (HOSPITAL_COMMUNITY): Payer: 59

## 2023-09-10 DIAGNOSIS — Z743 Need for continuous supervision: Secondary | ICD-10-CM | POA: Diagnosis not present

## 2023-09-10 DIAGNOSIS — R519 Headache, unspecified: Secondary | ICD-10-CM | POA: Diagnosis not present

## 2023-09-10 DIAGNOSIS — S20211A Contusion of right front wall of thorax, initial encounter: Secondary | ICD-10-CM | POA: Diagnosis not present

## 2023-09-10 DIAGNOSIS — S0990XA Unspecified injury of head, initial encounter: Secondary | ICD-10-CM | POA: Diagnosis not present

## 2023-09-10 DIAGNOSIS — D3500 Benign neoplasm of unspecified adrenal gland: Secondary | ICD-10-CM | POA: Insufficient documentation

## 2023-09-10 DIAGNOSIS — W01198A Fall on same level from slipping, tripping and stumbling with subsequent striking against other object, initial encounter: Secondary | ICD-10-CM | POA: Diagnosis not present

## 2023-09-10 DIAGNOSIS — R918 Other nonspecific abnormal finding of lung field: Secondary | ICD-10-CM | POA: Diagnosis not present

## 2023-09-10 DIAGNOSIS — F17210 Nicotine dependence, cigarettes, uncomplicated: Secondary | ICD-10-CM | POA: Insufficient documentation

## 2023-09-10 DIAGNOSIS — R0781 Pleurodynia: Secondary | ICD-10-CM | POA: Diagnosis not present

## 2023-09-10 DIAGNOSIS — I700 Atherosclerosis of aorta: Secondary | ICD-10-CM | POA: Diagnosis not present

## 2023-09-10 DIAGNOSIS — J432 Centrilobular emphysema: Secondary | ICD-10-CM | POA: Diagnosis not present

## 2023-09-10 DIAGNOSIS — Z7951 Long term (current) use of inhaled steroids: Secondary | ICD-10-CM | POA: Insufficient documentation

## 2023-09-10 DIAGNOSIS — Z853 Personal history of malignant neoplasm of breast: Secondary | ICD-10-CM | POA: Insufficient documentation

## 2023-09-10 DIAGNOSIS — J449 Chronic obstructive pulmonary disease, unspecified: Secondary | ICD-10-CM | POA: Diagnosis not present

## 2023-09-10 DIAGNOSIS — S299XXA Unspecified injury of thorax, initial encounter: Secondary | ICD-10-CM | POA: Diagnosis present

## 2023-09-10 DIAGNOSIS — W19XXXA Unspecified fall, initial encounter: Secondary | ICD-10-CM

## 2023-09-10 MED ORDER — HYDROCODONE-ACETAMINOPHEN 5-325 MG PO TABS
1.0000 | ORAL_TABLET | Freq: Once | ORAL | Status: AC
  Administered 2023-09-10: 1 via ORAL
  Filled 2023-09-10: qty 1

## 2023-09-10 MED ORDER — ACETAMINOPHEN 325 MG PO TABS: 650.0000 mg | ORAL_TABLET | Freq: Four times a day (QID) | ORAL | 0 refills | Status: DC | PRN

## 2023-09-10 MED ORDER — IBUPROFEN 600 MG PO TABS: 600.0000 mg | ORAL_TABLET | Freq: Four times a day (QID) | ORAL | 0 refills | Status: DC | PRN

## 2023-09-10 MED ORDER — LIDOCAINE 5 % EX PTCH: 1.0000 | MEDICATED_PATCH | Freq: Every day | CUTANEOUS | 0 refills | Status: DC | PRN

## 2023-09-10 MED ORDER — OXYCODONE HCL 5 MG PO TABS: 5.0000 mg | ORAL_TABLET | Freq: Four times a day (QID) | ORAL | 0 refills | Status: DC | PRN

## 2023-09-10 MED ORDER — LIDOCAINE 5 % EX PTCH
1.0000 | MEDICATED_PATCH | CUTANEOUS | Status: DC
  Administered 2023-09-10: 1 via TRANSDERMAL
  Filled 2023-09-10: qty 1

## 2023-09-10 NOTE — ED Triage Notes (Addendum)
Pt c/o right side rib pain, states she fell yesterday. Unsure if she hit her head, denies LOC.

## 2023-09-10 NOTE — Discharge Instructions (Addendum)
It was a pleasure caring for you today in the emergency department.  Your workup today is concerning for possible bruises to your ribs from your recent fall.  There is also evidence of coronary artery disease on your imaging, please follow-up with your PCP for further evaluation and risk management.  Please stop smoking as this is likely exacerbating your underlying, medical conditions.  Please return to the emergency department for any worsening or worrisome symptoms.

## 2023-09-10 NOTE — ED Provider Notes (Signed)
[place] EMERGENCY DEPARTMENT AT [facility] Provider Note  CSN: [id number] Arrival date & time: 09/10/23 0447  Chief Complaint(s) Fall and Right Rib Pain  HPI [name] is a 72 y.o. female with PMH COPD, alcohol abuse in remission, seizures, previous CVA who presents emergency room for evaluation of a fall with right-sided rib pain.  She states that she suffered a fall on 09/09/2023 landing on her right ribs and struck her head against the door.  Denies associated loss of consciousness, abdominal pain, nausea, vomiting, headache, fever, numbness, tingling, weakness or other neurologic complaints.  Endorses pain primarily on the right lateral chest wall.   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)    S/P right hip fracture    Acute blood loss as cause of postoperative anemia    Closed fracture of right hip (HCC) s/p compression fixation 04/25/19 04/24/2019   Leukocytosis 04/24/2019   COPD (chronic obstructive pulmonary disease) (HCC) 05/30/2017   Colitis 08/21/2016   Adrenal mass, left (HCC) 08/21/2016   Solitary pulmonary nodule 08/21/2016   Tobacco use disorder 08/21/2016   GERD (gastroesophageal reflux disease) 06/13/2010   IRRITABLE BOWEL SYNDROME 06/13/2010   HEMATOCHEZIA 06/13/2010   WEIGHT LOSS, ABNORMAL 06/13/2010   ABDOMINAL PAIN, GENERALIZED 06/13/2010   Home Medication(s) Prior to Admission medications   Medication Sig Start  Date End Date Taking? Authorizing Provider  albuterol (PROVENTIL) (2.5 MG/3ML) 0.083% nebulizer solution Take 2.5 mg by nebulization every Monday, Wednesday, and Friday.    [provider]  albuterol (VENTOLIN HFA) 108 (90 Base) MCG/ACT inhaler Inhale 2 puffs into the lungs every 6 (six) hours as needed for wheezing or shortness of breath.    [provider]  Aromatic Inhalants (VICKS VAPOINHALER IN) Inhale 1 puff into the lungs daily as needed (congestion).    [provider]  atorvastatin (LIPITOR) 20 MG tablet Take 20 mg by mouth every evening.  01/08/19   [provider]  carboxymethylcellulose (REFRESH PLUS) 0.5 % SOLN Place 1 drop into both eyes 3 (three) times daily as needed (dry/irritated eyes.).    [provider]  clonazePAM (KLONOPIN) 1 MG tablet Take 1 tablet (1 mg total) by mouth 3 (three) times daily. Patient taking differently: Take 1 mg by mouth 4 (four) times daily as needed for anxiety. 04/26/19   Vassie Loll, MD  dicyclomine (BENTYL) 10 MG capsule Take 10 mg by mouth 2 (two) times daily.    [provider]  fluconazole (DIFLUCAN) 100 MG tablet Take 2 tablets (200mg ) the first day and then 1 tablet once a day for the next 21 days. Patient not taking: Reported on 08/15/2022 07/06/22   Corbin Ade, MD  gabapentin (NEURONTIN) 300 MG capsule TAKE (1) CAPSULE BY MOUTH THREE TIMES DAILY 02/06/23   Doreatha Massed, MD  hydrOXYzine (ATARAX) 25 MG tablet Take  25 mg by mouth 2 (two) times daily. 09/10/22   [provider]  Multiple Vitamin (MULTIVITAMIN WITH MINERALS) TABS tablet Take 1 tablet by mouth 3 (three) times a week.    [provider]  omeprazole (PRILOSEC) 40 MG capsule Take 40 mg by mouth daily.  10/04/16   [provider]  phenytoin (DILANTIN) 100 MG ER capsule Take 400 mg by mouth at bedtime.    [provider]  predniSONE (STERAPRED UNI-PAK 21 TAB) 10 MG (21) TBPK tablet 10mg  Tabs,  6 day taper. Use as directed 03/31/23   Pollyann Savoy, MD  STIOLTO RESPIMAT 2.5-2.5 MCG/ACT AERS Inhale 2 puffs into the lungs daily. 06/25/22   [provider]  tamoxifen (NOLVADEX) 20 MG tablet TAKE ONE TABLET BY MOUTH ONCE DAILY. 11/15/22   Doreatha Massed, MD  tiZANidine (ZANAFLEX) 4 MG tablet Take 4 mg by mouth 2 (two) times daily. 08/22/18   [provider]  traZODone (DESYREL) 150 MG tablet Take 225 mg by mouth at bedtime.    [provider]                                                                                                                                    Past Surgical History Past Surgical History:  Procedure Laterality Date   ABDOMINAL HYSTERECTOMY     BIOPSY  09/13/2022   Procedure: BIOPSY;  Surgeon: Lanelle Bal, DO;  Location: [facility];  Service: Endoscopy;;   CHOLECYSTECTOMY     COLONOSCOPY WITH PROPOFOL N/A 09/13/2022   Procedure: COLONOSCOPY WITH PROPOFOL;  Surgeon: Lanelle Bal, DO;  Location: [facility];  Service: Endoscopy;  Laterality: N/A;  11:15 am   COMPRESSION HIP SCREW Right 04/25/2019   Procedure: COMPRESSION HIP;  Surgeon: Vickki Hearing, MD;  Location: [facility];  Service: Orthopedics;  Laterality: Right;  1030am   ESOPHAGEAL BRUSHING  07/04/2022   Procedure: ESOPHAGEAL BRUSHING;  Surgeon: Corbin Ade, MD;  [facility]: [facility];  Service: Endoscopy;;   ESOPHAGOGASTRODUODENOSCOPY (EGD) WITH PROPOFOL N/A 07/04/2022   Procedure: ESOPHAGOGASTRODUODENOSCOPY (EGD) WITH PROPOFOL;  Surgeon: Corbin Ade, MD;  [facility]: [facility];  Service: Endoscopy;  Laterality: N/A;  ESOPHAGOGASTRODUODENOSCOPY WITH FOOD IMPACTION REMOVAL   ESOPHAGOGASTRODUODENOSCOPY (EGD) WITH PROPOFOL N/A 09/13/2022   Procedure: ESOPHAGOGASTRODUODENOSCOPY (EGD) WITH PROPOFOL;  Surgeon: Lanelle Bal, DO;  [facility]: [facility];  Service: Endoscopy;  Laterality: N/A;   EYE SURGERY Bilateral    cataract removal    MASTECTOMY, PARTIAL Right 07/20/2020   Procedure: MASTECTOMY PARTIAL;  Surgeon: Franky Macho, MD;  Location: [facility];  Service: General;  Laterality: Right;   PARTIAL MASTECTOMY WITH AXILLARY SENTINEL LYMPH NODE BIOPSY Right 07/06/2020   Procedure: RIGHT PARTIAL MASTECTOMY WITH AXILLARY SENTINEL LYMPH NODE BIOPSY;  Surgeon: Franky Macho, MD;  Location: [facility];  Service: General;  Laterality: Right;   POLYPECTOMY  09/13/2022   Procedure: POLYPECTOMY;  Surgeon: Lanelle Bal, DO;  Location: [facility];  Service: Endoscopy;;   Family History Family History  Problem Relation Age of Onset   Dementia Father     Social History Social History   Tobacco Use   Smoking status: Every Day    Current packs/day: 0.50    Average packs/day: 0.5 packs/day for 32.0 years (16.0 ttl pk-yrs)    Types: Cigarettes   Smokeless tobacco: Never  Vaping Use   Vaping status: Some Days  Substance Use Topics   Alcohol use: No    Comment: h/o heavy use, quit in 1995   Drug use: Yes    Types: Marijuana    Comment: sometimes   Allergies Varenicline, Codeine, Influenza virus vaccine, Lithium, and Haemophilus b polysaccharide vaccine  Review of Systems Review of Systems  Cardiovascular:  Positive for chest pain.    Physical Exam Vital Signs  I have reviewed the triage vital signs BP 132/84   Pulse 87   Temp 98.4 F (36.9 C) (Oral)   Resp (!) 21   SpO2 94%   Physical Exam Vitals and nursing note reviewed.  Constitutional:      General: She is not in acute distress.    Appearance: She is well-developed.  HENT:     Head: Normocephalic and atraumatic.  Eyes:     Conjunctiva/sclera: Conjunctivae normal.  Cardiovascular:     Rate and Rhythm: Normal rate and regular rhythm.     Heart sounds: No murmur heard. Pulmonary:     Effort: Pulmonary effort is normal. No respiratory distress.     Breath sounds: Normal breath sounds.  Abdominal:     Palpations: Abdomen is soft.     Tenderness: There  is no abdominal tenderness.  Musculoskeletal:        General: Tenderness present. No swelling.     Cervical back: Neck supple.  Skin:    General: Skin is warm and dry.     Capillary Refill: Capillary refill takes less than 2 seconds.  Neurological:     Mental Status: She is alert.  Psychiatric:        Mood and Affect: Mood normal.     ED Results and Treatments Labs (all labs ordered are listed, but only abnormal results are displayed) Labs Reviewed - No data to display                                                                                                                        Radiology No results found.  Pertinent labs & imaging results that were available during my care of the patient were reviewed by me and considered in my medical decision making (see MDM for details).  Medications Ordered in ED Medications  lidocaine (LIDODERM) 5 % 1 patch (1 patch Transdermal Patch Applied 09/10/23 0511)  HYDROcodone-acetaminophen (NORCO/VICODIN) 5-325 MG per tablet 1 tablet (1 tablet Oral Given 09/10/23 0511)  Procedures Procedures  (including critical care time)  Medical Decision Making / ED Course   This patient presents to the ED for concern of fall, rib pain, this involves an extensive number of treatment options, and is a complaint that carries with it a high risk of complications and morbidity.  The differential diagnosis includes fracture, contusion, hematoma, ligamentous injury, closed head injury, ICH, cervical injury, pneumothorax  MDM: Patient seen emergency room for evaluation of a fall with right-sided rib pain.  Physical exam with tenderness to the posterior ribs on the right but is otherwise unremarkable.  Trauma imaging including CT head and C-spine is reassuringly unremarkable.  At time of signout, patient pending results of CT  chest.  Please see provider signout for continuation of workup.  Anticipate discharge if imaging is negative.   Additional history obtained:  -External records from outside source obtained and reviewed including: Chart review including previous notes, labs, imaging, consultation notes      Imaging Studies ordered: I ordered imaging studies including CT head and C-spine I independently visualized and interpreted imaging. I agree with the radiologist interpretation  CT chest pending   Medicines ordered and prescription drug management: Meds ordered this encounter  Medications   lidocaine (LIDODERM) 5 % 1 patch   HYDROcodone-acetaminophen (NORCO/VICODIN) 5-325 MG per tablet 1 tablet    -I have reviewed the patients home medicines and have made adjustments as needed  Critical interventions none    Cardiac Monitoring: The patient was maintained on a cardiac monitor.  I personally viewed and interpreted the cardiac monitored which showed an underlying rhythm of: NSR  Social Determinants of Health:  Factors impacting patients care include: none   Reevaluation: After the interventions noted above, I reevaluated the patient and found that they have :improved  Co morbidities that complicate the patient evaluation  Past Medical History:  Diagnosis Date   Alcohol abuse, in remission    Anxiety    Arthritis    Bipolar disorder (HCC)    Colitis    COPD (chronic obstructive pulmonary disease) (HCC)    occasional home O2   Gastric ulcer    GERD (gastroesophageal reflux disease)    Hemorrhoids    IBS (irritable bowel syndrome)    Seizures (HCC)    last seizure was about a year ago   Stroke [facility])    right sided weakness-slight      Dispostion: I considered admission for this patient, and disposition pending completion of imaging studies.  Please see provider signout for continuation of workup.     Final Clinical Impression(s) / ED Diagnoses Final diagnoses:  None      @PCDICTATION @    Glendora Score, MD 09/10/23 1911

## 2023-09-10 NOTE — ED Notes (Signed)
Patient transported to CT 

## 2023-09-10 NOTE — ED Provider Notes (Signed)
  Provider Note MRN:  [id number]  Arrival date & time: 09/10/23    ED Course and Medical Decision Making  Assumed care from Kommer at shift change.  See note from prior team for complete details, in brief:  Fall  Ct pending   Plan per prior physician follow-up imaging  Patient here with a fall, some right-sided rib pain.  CT imaging was obtained by prior team, CT head, cervical spine and chest was reviewed.  CT chest demonstrates some atelectasis, adrenal adenoma, COPD, atherosclerosis, CAD.  Radiology concern for infectious versus inflammatory findings right lower lobe.  Patient has no cough, fevers, URI symptoms.  My suspicion for pneumonia is low.  Favor likely rib contusion from fall.  Will give incentive spirometer, analgesics for home, follow-up PCP was encouraged  The patient improved significantly and was discharged in stable condition. Detailed discussions were had with the patient regarding current findings, and need for close f/u with PCP or on call doctor. The patient has been instructed to return immediately if the symptoms worsen in any way for re-evaluation. Patient verbalized understanding and is in agreement with current care plan. All questions answered prior to discharge.      Counseled patient for approximately 3 minutes regarding smoking cessation. Discussed risks of smoking and how they applied and affected their visit here today. Patient not ready to quit at this time, however will follow up with their primary doctor when they are.   CPT code: 13086: intermediate counseling for smoking cessation       Procedures  Final Clinical Impressions(s) / ED Diagnoses     ICD-10-CM   1. Contusion of rib on right side, initial encounter  S20.211A     2. Fall, initial encounter  W19.XXXA     3. Adrenal adenoma, unspecified laterality  D35.00     4. Aortic atherosclerosis (HCC)  I70.0       ED Discharge Orders     None         Discharge Instructions       It was a pleasure caring for you today in the emergency department.  Your workup today is concerning for possible bruises to your ribs from your recent fall.  There is also evidence of coronary artery disease on your imaging, please follow-up with your PCP for further evaluation and restratification.  Please stop smoking as this is likely exacerbating your underlying, medical conditions.  Please return to the emergency department for any worsening or worrisome symptoms.          Sloan Leiter, DO 09/10/23 [phone]

## 2023-09-28 DIAGNOSIS — I10 Essential (primary) hypertension: Secondary | ICD-10-CM | POA: Diagnosis not present

## 2023-09-28 DIAGNOSIS — J449 Chronic obstructive pulmonary disease, unspecified: Secondary | ICD-10-CM | POA: Diagnosis not present

## 2023-10-01 ENCOUNTER — Encounter (HOSPITAL_COMMUNITY): Payer: Self-pay | Admitting: Emergency Medicine

## 2023-10-01 ENCOUNTER — Emergency Department (HOSPITAL_COMMUNITY)
Admission: EM | Admit: 2023-10-01 | Discharge: 2023-10-01 | Discharge status: Against Medical Advice | Payer: 59 | Attending: Student | Admitting: Student

## 2023-10-01 DIAGNOSIS — M7989 Other specified soft tissue disorders: Secondary | ICD-10-CM | POA: Diagnosis present

## 2023-10-01 DIAGNOSIS — D649 Anemia, unspecified: Secondary | ICD-10-CM | POA: Insufficient documentation

## 2023-10-01 DIAGNOSIS — J449 Chronic obstructive pulmonary disease, unspecified: Secondary | ICD-10-CM | POA: Diagnosis not present

## 2023-10-01 DIAGNOSIS — M79605 Pain in left leg: Secondary | ICD-10-CM | POA: Diagnosis not present

## 2023-10-01 DIAGNOSIS — M79604 Pain in right leg: Secondary | ICD-10-CM | POA: Diagnosis not present

## 2023-10-01 DIAGNOSIS — R600 Localized edema: Secondary | ICD-10-CM | POA: Insufficient documentation

## 2023-10-01 DIAGNOSIS — Z5329 Procedure and treatment not carried out because of patient's decision for other reasons: Secondary | ICD-10-CM | POA: Diagnosis not present

## 2023-10-01 DIAGNOSIS — R0789 Other chest pain: Secondary | ICD-10-CM | POA: Diagnosis not present

## 2023-10-01 DIAGNOSIS — Z5321 Procedure and treatment not carried out due to patient leaving prior to being seen by health care provider: Secondary | ICD-10-CM

## 2023-10-01 LAB — TROPONIN I (HIGH SENSITIVITY): Troponin I (High Sensitivity): 5 ng/L (ref <18)

## 2023-10-01 LAB — COMPREHENSIVE METABOLIC PANEL
ALT: 13 U/L (ref 0–44)
AST: 10 U/L — ABNORMAL LOW (ref 15–41)
Albumin: 3 g/dL — ABNORMAL LOW (ref 3.5–5.0)
Alkaline Phosphatase: 68 U/L (ref 38–126)
Anion gap: 9 (ref 5–15)
BUN: 9 mg/dL (ref 8–23)
CO2: 26 mmol/L (ref 22–32)
Calcium: 8.5 mg/dL — ABNORMAL LOW (ref 8.9–10.3)
Chloride: 106 mmol/L (ref 98–111)
Creatinine, Ser: 0.77 mg/dL (ref 0.44–1.00)
GFR, Estimated: >60 mL/min (ref >60)
Glucose, Bld: 106 mg/dL — ABNORMAL HIGH (ref 70–99)
Potassium: 4 mmol/L (ref 3.5–5.1)
Sodium: 141 mmol/L (ref 135–145)
Total Bilirubin: 0.6 mg/dL (ref <1.2)
Total Protein: 6.1 g/dL — ABNORMAL LOW (ref 6.5–8.1)

## 2023-10-01 LAB — CBC WITH DIFFERENTIAL/PLATELET
Abs Immature Granulocytes: 0.01 10*3/uL (ref 0.00–0.07)
Basophils Absolute: 0.1 10*3/uL (ref 0.0–0.1)
Basophils Relative: 1 %
Eosinophils Absolute: 0.1 10*3/uL (ref 0.0–0.5)
Eosinophils Relative: 2 %
HCT: 33 % — ABNORMAL LOW (ref 36.0–46.0)
Hemoglobin: 10.9 g/dL — ABNORMAL LOW (ref 12.0–15.0)
Immature Granulocytes: 0 %
Lymphocytes Relative: 28 %
Lymphs Abs: 1.5 10*3/uL (ref 0.7–4.0)
MCH: 32.8 pg (ref 26.0–34.0)
MCHC: 33 g/dL (ref 30.0–36.0)
MCV: 99.4 fL (ref 80.0–100.0)
Monocytes Absolute: 0.6 10*3/uL (ref 0.1–1.0)
Monocytes Relative: 11 %
Neutro Abs: 3.2 10*3/uL (ref 1.7–7.7)
Neutrophils Relative %: 58 %
Platelets: 193 10*3/uL (ref 150–400)
RBC: 3.32 MIL/uL — ABNORMAL LOW (ref 3.87–5.11)
RDW: 13.7 % (ref 11.5–15.5)
WBC: 5.6 10*3/uL (ref 4.0–10.5)
nRBC: 0 % (ref 0.0–0.2)

## 2023-10-01 LAB — BRAIN NATRIURETIC PEPTIDE: B Natriuretic Peptide: 231 pg/mL — ABNORMAL HIGH (ref 0.0–100.0)

## 2023-10-01 NOTE — ED Provider Notes (Signed)
[place] EMERGENCY DEPARTMENT AT [facility] Provider Note   CSN: [id number] Arrival date & time: 10/01/23  7829     History Chief Complaint  Patient presents with   Leg Swelling    [name] is a 72 y.o. female with h/o COPD presents to the ER for evaluation of bilateral lower leg swelling. She reports that she woke up with it this morning. She denies any pain or trauma to the area. She denies any numbness or tingling. She reports she did a lot of walking yesterday and thinks that is what it is from. She denies any chest pain, SOB, fevers. She reports that this has happened before from walking. Denies any use of Lasix of h/o CHF. She reports that she is hungry and wants to eat. I discussed with her that we need to wait till her labs come back. She reports she walks with a cane at baseline.  HPI     Home Medications Prior to Admission medications   Medication Sig Start Date End Date Taking? Authorizing Provider  acetaminophen (TYLENOL) 325 MG tablet Take 2 tablets (650 mg total) by mouth every 6 (six) hours as needed. 09/10/23   Sloan Leiter, DO  albuterol (PROVENTIL) (2.5 MG/3ML) 0.083% nebulizer solution Take 2.5 mg by nebulization every Monday, Wednesday, and Friday.    [provider]  albuterol (VENTOLIN HFA) 108 (90 Base) MCG/ACT inhaler Inhale 2 puffs into the lungs every 6 (six) hours as needed for wheezing or shortness of breath.    [provider]  Aromatic Inhalants (VICKS VAPOINHALER IN) Inhale 1 puff into the lungs daily as needed (congestion).    [provider]  atorvastatin (LIPITOR) 20 MG tablet Take 20 mg by mouth every evening.  01/08/19   [provider]  carboxymethylcellulose (REFRESH PLUS) 0.5 % SOLN Place 1 drop into both eyes 3 (three) times daily as needed (dry/irritated eyes.).    [provider]  clonazePAM (KLONOPIN) 1 MG tablet Take 1 tablet (1 mg total) by mouth 3 (three) times daily. Patient  taking differently: Take 1 mg by mouth 4 (four) times daily as needed for anxiety. 04/26/19   Vassie Loll, MD  dicyclomine (BENTYL) 10 MG capsule Take 10 mg by mouth 2 (two) times daily.    [provider]  fluconazole (DIFLUCAN) 100 MG tablet Take 2 tablets (200mg ) the first day and then 1 tablet once a day for the next 21 days. Patient not taking: Reported on 08/15/2022 07/06/22   Corbin Ade, MD  gabapentin (NEURONTIN) 300 MG capsule TAKE (1) CAPSULE BY MOUTH THREE TIMES DAILY 02/06/23   Doreatha Massed, MD  hydrOXYzine (ATARAX) 25 MG tablet Take 25 mg by mouth 2 (two) times daily. 09/10/22   [provider]  ibuprofen (ADVIL) 600 MG tablet Take 1 tablet (600 mg total) by mouth every 6 (six) hours as needed. 09/10/23   Tanda Rockers A, DO  lidocaine (LIDODERM) 5 % Place 1 patch onto the skin daily as needed. Remove & Discard patch within 12 hours or as directed by MD 09/10/23   Sloan Leiter, DO  Multiple Vitamin (MULTIVITAMIN WITH MINERALS) TABS tablet Take 1 tablet by mouth 3 (three) times a week.    [provider]  omeprazole (PRILOSEC) 40 MG capsule Take 40 mg by mouth daily.  10/04/16   [provider]  oxyCODONE (ROXICODONE) 5 MG immediate release tablet Take 1 tablet (5 mg total) by mouth every 6 (six) hours  as needed for severe pain (pain score 7-10). 09/10/23   Sloan Leiter, DO  phenytoin (DILANTIN) 100 MG ER capsule Take 400 mg by mouth at bedtime.    [provider]  predniSONE (STERAPRED UNI-PAK 21 TAB) 10 MG (21) TBPK tablet 10mg  Tabs, 6 day taper. Use as directed 03/31/23   Pollyann Savoy, MD  STIOLTO RESPIMAT 2.5-2.5 MCG/ACT AERS Inhale 2 puffs into the lungs daily. 06/25/22   [provider]  tamoxifen (NOLVADEX) 20 MG tablet TAKE ONE TABLET BY MOUTH ONCE DAILY. 11/15/22   Doreatha Massed, MD  tiZANidine (ZANAFLEX) 4 MG tablet Take 4 mg by mouth 2 (two) times daily. 08/22/18   [provider]  traZODone  (DESYREL) 150 MG tablet Take 225 mg by mouth at bedtime.    [provider]      Allergies    Varenicline, Codeine, Influenza virus vaccine, Lithium, and Haemophilus b polysaccharide vaccine    Review of Systems   Review of Systems  Constitutional:  Negative for chills and fever.  Respiratory:  Negative for shortness of breath.   Cardiovascular:  Positive for leg swelling. Negative for chest pain.  Gastrointestinal:  Negative for abdominal pain, diarrhea, nausea and vomiting.    Physical Exam Updated Vital Signs BP (!) 141/68 (BP Location: Right Arm)   Pulse 81   Temp 98.6 F (37 C) (Oral)   Resp 17   Ht 5\' 3"  (1.6 m)   Wt 56.7 kg   SpO2 96%   BMI 22.14 kg/m  Physical Exam Constitutional:      Appearance: She is not toxic-appearing.     Comments: Chronically ill appearing, but not toxic appearing. Sitting comfortably on stretcher.   Eyes:     General: No scleral icterus. Cardiovascular:     Rate and Rhythm: Normal rate.     Pulses: Normal pulses.  Pulmonary:     Effort: Pulmonary effort is normal. No respiratory distress.     Breath sounds: Normal breath sounds. No wheezing.  Abdominal:     Tenderness: There is no abdominal tenderness. There is no guarding or rebound.  Musculoskeletal:     Right lower leg: Edema present.     Left lower leg: Edema present.     Comments: Trace edema up to the mid shin bilaterally. Dry skin noted with some excoriation marks but without increase in erythema or warmth. Coloration, size, and temperature appera and feel symmetric bilaterally. Compartments soft. Sensation reportedly intact and symmetric. Palpable pulses.   Skin:    General: Skin is warm and dry.  Neurological:     Mental Status: She is alert.     ED Results / Procedures / Treatments   Labs (all labs ordered are listed, but only abnormal results are displayed) Labs Reviewed  CBC WITH DIFFERENTIAL/PLATELET - Abnormal; Notable for the following components:       Result Value   RBC 3.32 (*)    Hemoglobin 10.9 (*)    HCT 33.0 (*)    All other components within normal limits  COMPREHENSIVE METABOLIC PANEL  BRAIN NATRIURETIC PEPTIDE  TROPONIN I (HIGH SENSITIVITY)  TROPONIN I (HIGH SENSITIVITY)    EKG None  Radiology No results found.  Procedures Procedures    Medications Ordered in ED Medications - No data to display  ED Course/ Medical Decision Making/ A&P   Medical Decision Making Amount and/or Complexity of Data Reviewed Labs: ordered.   72 y.o. female presents to the ER for evaluation of bilateral painless  leg swelling. Differential diagnosis includes but is not limited to dependent edema, chronic venous insufficiency, bilateral DVT, cellulitis . Vital signs RR at 21, however patient is speaking in full sentences with ease and satting well. Otherwise unremarkable. Physical exam as noted above.   I independently reviewed and interpreted the patient's labs. CBC shows mild anemia at 10.9 which is new from a baseline of 13.0. CMP pending. BNP pending. Troponin pending.   The patient reports that she was hungry and wanted to leave. I discussed with her that her lab work had not resulted yet and we needed to wait for results before she was allowed to eat.   Was informed by nursing that the patient eloped from the ER with her cane and steady gait. She reported to them that she was ready to go and didn't want her results.  Portions of this report may have been transcribed using voice recognition software. Every effort was made to ensure accuracy; however, inadvertent computerized transcription errors may be present.   Final Clinical Impression(s) / ED Diagnoses Final diagnoses:  Eloped from emergency department    Rx / DC Orders ED Discharge Orders     None         Achille Rich, Cordelia Poche 10/01/23 1024    Kommor, [place], MD 10/01/23 2025

## 2023-10-01 NOTE — ED Notes (Addendum)
Pt stated she was ready to be discharged and to "call her Husband, pt was seen walking to the waiting room with her cane, pt was stopped and asked where she was going and pt states "I am ready to go, call my husband" pt did not want to speak about lab work, PA-C made aware. Attempted to call pts husband an son listed on contact list and was unable to reach either of them

## 2023-10-01 NOTE — ED Triage Notes (Signed)
Pt c/o bilateral leg and foot swelling that started this morning. Denies hx of CHF, denies CP, denies sob, denies pain

## 2023-10-02 DIAGNOSIS — R609 Edema, unspecified: Secondary | ICD-10-CM | POA: Diagnosis not present

## 2023-10-02 DIAGNOSIS — J449 Chronic obstructive pulmonary disease, unspecified: Secondary | ICD-10-CM | POA: Diagnosis not present

## 2023-10-02 DIAGNOSIS — Z6821 Body mass index (BMI) 21.0-21.9, adult: Secondary | ICD-10-CM | POA: Diagnosis not present

## 2023-10-08 DIAGNOSIS — Z6820 Body mass index (BMI) 20.0-20.9, adult: Secondary | ICD-10-CM | POA: Diagnosis not present

## 2023-10-08 DIAGNOSIS — I10 Essential (primary) hypertension: Secondary | ICD-10-CM | POA: Diagnosis not present

## 2023-10-08 DIAGNOSIS — G894 Chronic pain syndrome: Secondary | ICD-10-CM | POA: Diagnosis not present

## 2023-10-08 DIAGNOSIS — H6501 Acute serous otitis media, right ear: Secondary | ICD-10-CM | POA: Diagnosis not present

## 2023-10-14 DIAGNOSIS — M79661 Pain in right lower leg: Secondary | ICD-10-CM | POA: Diagnosis not present

## 2023-10-14 DIAGNOSIS — R569 Unspecified convulsions: Secondary | ICD-10-CM | POA: Diagnosis not present

## 2023-10-14 DIAGNOSIS — M79604 Pain in right leg: Secondary | ICD-10-CM | POA: Diagnosis not present

## 2023-10-14 DIAGNOSIS — Z4789 Encounter for other orthopedic aftercare: Secondary | ICD-10-CM | POA: Diagnosis not present

## 2023-10-14 DIAGNOSIS — H9201 Otalgia, right ear: Secondary | ICD-10-CM | POA: Diagnosis not present

## 2023-10-14 DIAGNOSIS — Z853 Personal history of malignant neoplasm of breast: Secondary | ICD-10-CM | POA: Diagnosis not present

## 2023-10-14 DIAGNOSIS — Z7983 Long term (current) use of bisphosphonates: Secondary | ICD-10-CM | POA: Diagnosis not present

## 2023-10-14 DIAGNOSIS — I6523 Occlusion and stenosis of bilateral carotid arteries: Secondary | ICD-10-CM | POA: Diagnosis not present

## 2023-10-14 DIAGNOSIS — F17210 Nicotine dependence, cigarettes, uncomplicated: Secondary | ICD-10-CM | POA: Diagnosis not present

## 2023-10-14 DIAGNOSIS — R404 Transient alteration of awareness: Secondary | ICD-10-CM | POA: Diagnosis not present

## 2023-10-14 DIAGNOSIS — R42 Dizziness and giddiness: Secondary | ICD-10-CM | POA: Diagnosis not present

## 2023-10-14 DIAGNOSIS — Z743 Need for continuous supervision: Secondary | ICD-10-CM | POA: Diagnosis not present

## 2023-10-14 DIAGNOSIS — Z967 Presence of other bone and tendon implants: Secondary | ICD-10-CM | POA: Diagnosis not present

## 2023-10-14 DIAGNOSIS — M85851 Other specified disorders of bone density and structure, right thigh: Secondary | ICD-10-CM | POA: Diagnosis not present

## 2023-10-14 DIAGNOSIS — M1711 Unilateral primary osteoarthritis, right knee: Secondary | ICD-10-CM | POA: Diagnosis not present

## 2023-10-14 DIAGNOSIS — T161XXA Foreign body in right ear, initial encounter: Secondary | ICD-10-CM | POA: Diagnosis not present

## 2023-10-14 DIAGNOSIS — M810 Age-related osteoporosis without current pathological fracture: Secondary | ICD-10-CM | POA: Diagnosis not present

## 2023-10-14 DIAGNOSIS — W06XXXA Fall from bed, initial encounter: Secondary | ICD-10-CM | POA: Diagnosis not present

## 2023-10-14 DIAGNOSIS — R6889 Other general symptoms and signs: Secondary | ICD-10-CM | POA: Diagnosis not present

## 2023-10-14 DIAGNOSIS — R519 Headache, unspecified: Secondary | ICD-10-CM | POA: Diagnosis not present

## 2023-10-14 DIAGNOSIS — Z79899 Other long term (current) drug therapy: Secondary | ICD-10-CM | POA: Diagnosis not present

## 2023-10-14 DIAGNOSIS — H918X1 Other specified hearing loss, right ear: Secondary | ICD-10-CM | POA: Diagnosis not present

## 2023-10-16 DIAGNOSIS — G4489 Other headache syndrome: Secondary | ICD-10-CM | POA: Diagnosis not present

## 2023-10-16 DIAGNOSIS — R262 Difficulty in walking, not elsewhere classified: Secondary | ICD-10-CM | POA: Diagnosis not present

## 2023-10-16 DIAGNOSIS — R404 Transient alteration of awareness: Secondary | ICD-10-CM | POA: Diagnosis not present

## 2023-10-16 DIAGNOSIS — Z743 Need for continuous supervision: Secondary | ICD-10-CM | POA: Diagnosis not present

## 2023-10-29 DIAGNOSIS — J449 Chronic obstructive pulmonary disease, unspecified: Secondary | ICD-10-CM | POA: Diagnosis not present

## 2023-10-29 DIAGNOSIS — I700 Atherosclerosis of aorta: Secondary | ICD-10-CM | POA: Diagnosis not present

## 2023-12-09 DIAGNOSIS — I10 Essential (primary) hypertension: Secondary | ICD-10-CM | POA: Diagnosis not present

## 2023-12-09 DIAGNOSIS — Z6820 Body mass index (BMI) 20.0-20.9, adult: Secondary | ICD-10-CM | POA: Diagnosis not present

## 2023-12-09 DIAGNOSIS — J449 Chronic obstructive pulmonary disease, unspecified: Secondary | ICD-10-CM | POA: Diagnosis not present

## 2024-01-04 DIAGNOSIS — R079 Chest pain, unspecified: Secondary | ICD-10-CM | POA: Diagnosis not present

## 2024-01-04 DIAGNOSIS — Z743 Need for continuous supervision: Secondary | ICD-10-CM | POA: Diagnosis not present

## 2024-01-09 ENCOUNTER — Other Ambulatory Visit: Payer: Self-pay

## 2024-01-09 ENCOUNTER — Encounter (HOSPITAL_COMMUNITY): Payer: Self-pay

## 2024-01-09 ENCOUNTER — Emergency Department (HOSPITAL_COMMUNITY): Admission: EM | Admit: 2024-01-09 | Discharge: 2024-01-09 | Disposition: A | Discharge status: Home / Self Care

## 2024-01-09 ENCOUNTER — Emergency Department (HOSPITAL_COMMUNITY)

## 2024-01-09 DIAGNOSIS — Z7952 Long term (current) use of systemic steroids: Secondary | ICD-10-CM | POA: Insufficient documentation

## 2024-01-09 DIAGNOSIS — S0181XA Laceration without foreign body of other part of head, initial encounter: Secondary | ICD-10-CM | POA: Insufficient documentation

## 2024-01-09 DIAGNOSIS — I6782 Cerebral ischemia: Secondary | ICD-10-CM | POA: Diagnosis not present

## 2024-01-09 DIAGNOSIS — Z743 Need for continuous supervision: Secondary | ICD-10-CM | POA: Diagnosis not present

## 2024-01-09 DIAGNOSIS — W01198A Fall on same level from slipping, tripping and stumbling with subsequent striking against other object, initial encounter: Secondary | ICD-10-CM | POA: Insufficient documentation

## 2024-01-09 DIAGNOSIS — S0990XA Unspecified injury of head, initial encounter: Secondary | ICD-10-CM

## 2024-01-09 DIAGNOSIS — Y92002 Bathroom of unspecified non-institutional (private) residence single-family (private) house as the place of occurrence of the external cause: Secondary | ICD-10-CM | POA: Insufficient documentation

## 2024-01-09 DIAGNOSIS — J449 Chronic obstructive pulmonary disease, unspecified: Secondary | ICD-10-CM | POA: Insufficient documentation

## 2024-01-09 DIAGNOSIS — R6889 Other general symptoms and signs: Secondary | ICD-10-CM | POA: Diagnosis not present

## 2024-01-09 DIAGNOSIS — S199XXA Unspecified injury of neck, initial encounter: Secondary | ICD-10-CM | POA: Diagnosis not present

## 2024-01-09 DIAGNOSIS — Z7951 Long term (current) use of inhaled steroids: Secondary | ICD-10-CM | POA: Insufficient documentation

## 2024-01-09 DIAGNOSIS — W19XXXA Unspecified fall, initial encounter: Secondary | ICD-10-CM | POA: Diagnosis not present

## 2024-01-09 DIAGNOSIS — Z23 Encounter for immunization: Secondary | ICD-10-CM | POA: Diagnosis not present

## 2024-01-09 DIAGNOSIS — G319 Degenerative disease of nervous system, unspecified: Secondary | ICD-10-CM | POA: Diagnosis not present

## 2024-01-09 MED ORDER — LIDOCAINE-EPINEPHRINE (PF) 2 %-1:200000 IJ SOLN
10.0000 mL | Freq: Once | INTRAMUSCULAR | Status: AC
  Administered 2024-01-09: 10 mL
  Filled 2024-01-09: qty 20

## 2024-01-09 MED ORDER — ACETAMINOPHEN 500 MG PO TABS
1000.0000 mg | ORAL_TABLET | Freq: Once | ORAL | Status: AC
  Administered 2024-01-09: 1000 mg via ORAL
  Filled 2024-01-09: qty 2

## 2024-01-09 MED ORDER — TETANUS-DIPHTH-ACELL PERTUSSIS 5-2.5-18.5 LF-MCG/0.5 IM SUSY
0.5000 mL | PREFILLED_SYRINGE | Freq: Once | INTRAMUSCULAR | Status: AC
  Administered 2024-01-09: 0.5 mL via INTRAMUSCULAR
  Filled 2024-01-09: qty 0.5

## 2024-01-09 NOTE — Discharge Instructions (Signed)
 Please follow-up with your doctor in 5 to 7 days to have your sutures removed.  Return to the ER for worsening symptoms.

## 2024-01-09 NOTE — ED Provider Notes (Signed)
 [place] EMERGENCY DEPARTMENT AT [facility] Provider Note   CSN: [id number] Arrival date & time: 01/09/24  1821     History  Chief Complaint  Patient presents with   [name]    KIRSTIN KUGLER is a 73 y.o. female.  73 year old female with past medical history of alcohol abuse and COPD as well as seizure disorder presenting to the emergency department today after a mechanical fall at home.  The patient states she was walking when her walker when she lost her footing.  The patient did hit her head.  She did not lose consciousness.  She is reporting a headache since then.  She denies any neck pain.  Denies any chest pain.  Denies any pain in her extremities.  She denies any other injuries.   Fall Associated symptoms include headaches.       Home Medications Prior to Admission medications   Medication Sig Start Date End Date Taking? Authorizing Provider  acetaminophen (TYLENOL) 325 MG tablet Take 2 tablets (650 mg total) by mouth every 6 (six) hours as needed. 09/10/23   Sloan Leiter, DO  albuterol (PROVENTIL) (2.5 MG/3ML) 0.083% nebulizer solution Take 2.5 mg by nebulization every Monday, Wednesday, and Friday.    [provider]  albuterol (VENTOLIN HFA) 108 (90 Base) MCG/ACT inhaler Inhale 2 puffs into the lungs every 6 (six) hours as needed for wheezing or shortness of breath.    [provider]  Aromatic Inhalants (VICKS VAPOINHALER IN) Inhale 1 puff into the lungs daily as needed (congestion).    [provider]  atorvastatin (LIPITOR) 20 MG tablet Take 20 mg by mouth every evening.  01/08/19   [provider]  carboxymethylcellulose (REFRESH PLUS) 0.5 % SOLN Place 1 drop into both eyes 3 (three) times daily as needed (dry/irritated eyes.).    [provider]  clonazePAM (KLONOPIN) 1 MG tablet Take 1 tablet (1 mg total) by mouth 3 (three) times daily. Patient taking differently: Take 1 mg by mouth 4 (four) times daily as  needed for anxiety. 04/26/19   Vassie Loll, MD  dicyclomine (BENTYL) 10 MG capsule Take 10 mg by mouth 2 (two) times daily.    [provider]  fluconazole (DIFLUCAN) 100 MG tablet Take 2 tablets (200mg ) the first day and then 1 tablet once a day for the next 21 days. Patient not taking: Reported on 08/15/2022 07/06/22   Corbin Ade, MD  gabapentin (NEURONTIN) 300 MG capsule TAKE (1) CAPSULE BY MOUTH THREE TIMES DAILY 02/06/23   Doreatha Massed, MD  hydrOXYzine (ATARAX) 25 MG tablet Take 25 mg by mouth 2 (two) times daily. 09/10/22   [provider]  ibuprofen (ADVIL) 600 MG tablet Take 1 tablet (600 mg total) by mouth every 6 (six) hours as needed. 09/10/23   Tanda Rockers A, DO  lidocaine (LIDODERM) 5 % Place 1 patch onto the skin daily as needed. Remove & Discard patch within 12 hours or as directed by MD 09/10/23   Sloan Leiter, DO  Multiple Vitamin (MULTIVITAMIN WITH MINERALS) TABS tablet Take 1 tablet by mouth 3 (three) times a week.    [provider]  omeprazole (PRILOSEC) 40 MG capsule Take 40 mg by mouth daily.  10/04/16   [provider]  oxyCODONE (ROXICODONE) 5 MG immediate release tablet Take 1 tablet (5 mg total) by mouth every 6 (six) hours as needed for severe pain (pain score 7-10). 09/10/23   Sloan Leiter, DO  phenytoin (  DILANTIN) 100 MG ER capsule Take 400 mg by mouth at bedtime.    [provider]  predniSONE (STERAPRED UNI-PAK 21 TAB) 10 MG (21) TBPK tablet 10mg  Tabs, 6 day taper. Use as directed 03/31/23   Pollyann Savoy, MD  STIOLTO RESPIMAT 2.5-2.5 MCG/ACT AERS Inhale 2 puffs into the lungs daily. 06/25/22   [provider]  tamoxifen (NOLVADEX) 20 MG tablet TAKE ONE TABLET BY MOUTH ONCE DAILY. 11/15/22   Doreatha Massed, MD  tiZANidine (ZANAFLEX) 4 MG tablet Take 4 mg by mouth 2 (two) times daily. 08/22/18   [provider]  traZODone (DESYREL) 150 MG tablet Take 225 mg by mouth at bedtime.     [provider]      Allergies    Varenicline, Codeine, Influenza virus vaccine, Lithium, and Haemophilus b polysaccharide vaccine    Review of Systems   Review of Systems  Neurological:  Positive for headaches.  All other systems reviewed and are negative.   Physical Exam Updated Vital Signs BP 112/72   Pulse 82   Temp 98.2 F (36.8 C) (Oral)   Resp 17   Wt 56.7 kg   SpO2 98%   BMI 22.14 kg/m  Physical Exam Vitals and nursing note reviewed.   Gen: NAD Eyes: PERRL, EOMI, the patient has a 1 inch laceration noted over her right forehead with no active bleeding HEENT: no oropharyngeal swelling Neck: trachea midline, no midline tenderness Resp: clear to auscultation bilaterally Card: RRR, no murmurs, rubs, or gallops Abd: nontender, nondistended Extremities: no calf tenderness, no edema Vascular: 2+ radial pulses bilaterally, 2+ DP pulses bilaterally Neuro: Cranial nerves intact, equal strength and sensation throughout bilateral upper and lower extremities Skin: no rashes Psyc: acting appropriately   ED Results / Procedures / Treatments   Labs (all labs ordered are listed, but only abnormal results are displayed) Labs Reviewed - No data to display  EKG None  Radiology CT Head Wo Contrast Result Date: 01/09/2024 CLINICAL DATA:  Neck trauma (Age >= 65y); Head trauma, minor (Age >= 65y) EXAM: CT HEAD WITHOUT CONTRAST TECHNIQUE: Contiguous axial images were obtained from the base of the skull through the vertex without intravenous contrast. RADIATION DOSE REDUCTION: This exam was performed according to the departmental dose-optimization program which includes automated exposure control, adjustment of the mA and/or kV according to patient size and/or use of iterative reconstruction technique. COMPARISON:  10/14/2023. FINDINGS: Brain: There is periventricular white matter decreased attenuation consistent with small vessel ischemic changes. Ventricles, sulci and  cisterns are prominent consistent with age related involutional changes. No acute intracranial hemorrhage, mass effect or shift. No hydrocephalus. Vascular: No hyperdense vessel or unexpected calcification. Skull: Normal. Negative for fracture or focal lesion. Sinuses/Orbits: No acute finding. IMPRESSION: Atrophy and chronic small vessel ischemic changes. No acute intracranial process identified. EXAM: CT CERVICAL SPINE WITHOUT CONTRAST TECHNIQUE: Multidetector CT imaging of the cervical spine was performed without intravenous contrast. Multiplanar CT image reconstructions were also generated. RADIATION DOSE REDUCTION: This exam was performed according to the departmental dose-optimization program which includes automated exposure control, adjustment of the mA and/or kV according to patient size and/or use of iterative reconstruction technique. COMPARISON:  None Available. FINDINGS: Alignment: Normal. Skull base and vertebrae: No acute fracture. No primary bone lesion or focal pathologic process. Soft tissues and spinal canal: No prevertebral fluid or swelling. No visible canal hematoma. Disc levels: Disc space narrowing and marginal osteophyte formation at the C4 through C7 T1 levels. 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 Upper chest:  Negative. IMPRESSION: Degenerative changes.  No acute traumatic abnormality. Electronically Signed   By: Layla Maw M.D.   On: 01/09/2024 21:45   CT Cervical Spine Wo Contrast Result Date: 01/09/2024 CLINICAL DATA:  Neck trauma (Age >= 65y); Head trauma, minor (Age >= 65y) EXAM: CT HEAD WITHOUT CONTRAST TECHNIQUE: Contiguous axial images were obtained from the base of the skull through the vertex without intravenous contrast. RADIATION DOSE REDUCTION: This exam was performed according to the departmental dose-optimization program which includes automated exposure control, adjustment of the mA and/or kV according to patient size and/or use of iterative reconstruction technique. COMPARISON:   10/14/2023. FINDINGS: Brain: There is periventricular white matter decreased attenuation consistent with small vessel ischemic changes. Ventricles, sulci and cisterns are prominent consistent with age related involutional changes. No acute intracranial hemorrhage, mass effect or shift. No hydrocephalus. Vascular: No hyperdense vessel or unexpected calcification. Skull: Normal. Negative for fracture or focal lesion. Sinuses/Orbits: No acute finding. IMPRESSION: Atrophy and chronic small vessel ischemic changes. No acute intracranial process identified. EXAM: CT CERVICAL SPINE WITHOUT CONTRAST TECHNIQUE: Multidetector CT imaging of the cervical spine was performed without intravenous contrast. Multiplanar CT image reconstructions were also generated. RADIATION DOSE REDUCTION: This exam was performed according to the departmental dose-optimization program which includes automated exposure control, adjustment of the mA and/or kV according to patient size and/or use of iterative reconstruction technique. COMPARISON:  None Available. FINDINGS: Alignment: Normal. Skull base and vertebrae: No acute fracture. No primary bone lesion or focal pathologic process. Soft tissues and spinal canal: No prevertebral fluid or swelling. No visible canal hematoma. Disc levels: Disc space narrowing and marginal osteophyte formation at the C4 through C7 T1 levels. 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 Upper chest: Negative. IMPRESSION: Degenerative changes.  No acute traumatic abnormality. Electronically Signed   By: Layla Maw M.D.   On: 01/09/2024 21:45    Procedures .Laceration Repair  Date/Time: 01/09/2024 11:01 PM  Performed by: Durwin Glaze, MD Authorized by: Durwin Glaze, MD   Consent:    Consent obtained:  Verbal   Consent given by:  Patient   Risks discussed:  Infection, pain, retained foreign body, tendon damage, poor cosmetic result, need for additional repair, poor wound healing and vascular damage   Alternatives  discussed:  No treatment Universal protocol:    Procedure explained and questions answered to patient or proxy's satisfaction: yes     Relevant documents present and verified: yes     Test results available: yes     Imaging studies available: yes     Required blood products, implants, devices, and special equipment available: yes     Site/side marked: yes     Immediately prior to procedure, a time out was called: yes     Patient identity confirmed:  Verbally with patient Laceration details:    Location:  Face   Face location:  Forehead   Length (cm):  4.5   Depth (mm):  7 Pre-procedure details:    Preparation:  Patient was prepped and draped in usual sterile fashion and imaging obtained to evaluate for foreign bodies Exploration:    Limited defect created (wound extended): no     Hemostasis achieved with:  Epinephrine   Wound extent: areolar tissue not violated, fascia not violated, no foreign body, no signs of injury, no nerve damage, no tendon damage, no underlying fracture and no vascular damage     Contaminated: no   Treatment:    Amount of cleaning:  Standard   Irrigation solution:  Sterile saline   Debridement:  None   Undermining:  None   Scar revision: no     Layers/structures repaired:  Deep subcutaneous Deep subcutaneous:    Suture size:  5-0   Suture material:  Vicryl   Suture technique:  Simple interrupted   Number of sutures:  2 Skin repair:    Repair method:  Sutures   Suture size:  6-0   Suture material:  Prolene   Suture technique:  Simple interrupted   Number of sutures:  5 Approximation:    Approximation:  Close Repair type:    Repair type:  Intermediate Post-procedure details:    Dressing:  Open (no dressing)   Procedure completion:  Tolerated     Medications Ordered in ED Medications  lidocaine-EPINEPHrine (XYLOCAINE W/EPI) 2 %-1:200000 (PF) injection 10 mL (10 mLs Infiltration Given 01/09/24 1904)  acetaminophen (TYLENOL) tablet 1,000 mg (1,000  mg Oral Given 01/09/24 1905)  Tdap (BOOSTRIX) injection 0.5 mL (0.5 mLs Intramuscular Given 01/09/24 1905)    ED Course/ Medical Decision Making/ A&P                                 Medical Decision Making 73 year old female with past medical history of alcohol abuse, COPD, and seizures presenting to the emergency department today after a head injury after a mechanical fall at home.  The patient is otherwise well-appearing with stable vital signs.  I will obtain a CT scan of her head and cervical spine for further evaluation for acute traumatic injuries.  Will update her tetanus.  I will swap her laceration if her CT scan is negative.  Will give her Tylenol for pain.  The patient CT scans are negative.  Lacerations repaired here.  She is back to her baseline and is discharged with return precautions.  Amount and/or Complexity of Data Reviewed Radiology: ordered.  Risk OTC drugs. Prescription drug management.           Final Clinical Impression(s) / ED Diagnoses Final diagnoses:  Closed head injury, initial encounter    Rx / DC Orders ED Discharge Orders     None         Durwin Glaze, MD 01/09/24 2304

## 2024-01-09 NOTE — ED Triage Notes (Signed)
 EMS reports pt fell in bathroom and has a deep laceration to her forehead and denies any blood thinners.

## 2024-01-09 NOTE — ED Notes (Signed)
 Wounds dressed. IV removed. Husband arrived. Patient assisted to lobby.

## 2024-01-12 ENCOUNTER — Encounter (HOSPITAL_COMMUNITY): Payer: Self-pay | Admitting: Pharmacy Technician

## 2024-01-12 ENCOUNTER — Other Ambulatory Visit: Payer: Self-pay

## 2024-01-12 ENCOUNTER — Emergency Department (HOSPITAL_COMMUNITY)

## 2024-01-12 ENCOUNTER — Emergency Department (HOSPITAL_COMMUNITY)
Admission: EM | Admit: 2024-01-12 | Discharge: 2024-01-12 | Disposition: A | Discharge status: Home / Self Care | Attending: Emergency Medicine | Admitting: Emergency Medicine

## 2024-01-12 DIAGNOSIS — R1012 Left upper quadrant pain: Secondary | ICD-10-CM | POA: Diagnosis not present

## 2024-01-12 DIAGNOSIS — M25562 Pain in left knee: Secondary | ICD-10-CM | POA: Diagnosis not present

## 2024-01-12 DIAGNOSIS — S299XXA Unspecified injury of thorax, initial encounter: Secondary | ICD-10-CM | POA: Diagnosis present

## 2024-01-12 DIAGNOSIS — S0011XA Contusion of right eyelid and periocular area, initial encounter: Secondary | ICD-10-CM | POA: Diagnosis not present

## 2024-01-12 DIAGNOSIS — S20212A Contusion of left front wall of thorax, initial encounter: Secondary | ICD-10-CM | POA: Diagnosis not present

## 2024-01-12 DIAGNOSIS — W01198A Fall on same level from slipping, tripping and stumbling with subsequent striking against other object, initial encounter: Secondary | ICD-10-CM | POA: Diagnosis not present

## 2024-01-12 DIAGNOSIS — R062 Wheezing: Secondary | ICD-10-CM | POA: Insufficient documentation

## 2024-01-12 DIAGNOSIS — R519 Headache, unspecified: Secondary | ICD-10-CM | POA: Diagnosis not present

## 2024-01-12 DIAGNOSIS — R109 Unspecified abdominal pain: Secondary | ICD-10-CM | POA: Insufficient documentation

## 2024-01-12 DIAGNOSIS — Y900 Blood alcohol level of less than 20 mg/100 ml: Secondary | ICD-10-CM | POA: Insufficient documentation

## 2024-01-12 DIAGNOSIS — S0012XA Contusion of left eyelid and periocular area, initial encounter: Secondary | ICD-10-CM | POA: Diagnosis not present

## 2024-01-12 DIAGNOSIS — S31010A Laceration without foreign body of lower back and pelvis without penetration into retroperitoneum, initial encounter: Secondary | ICD-10-CM | POA: Diagnosis not present

## 2024-01-12 DIAGNOSIS — S31119A Laceration without foreign body of abdominal wall, unspecified quadrant without penetration into peritoneal cavity, initial encounter: Secondary | ICD-10-CM | POA: Diagnosis not present

## 2024-01-12 DIAGNOSIS — M1712 Unilateral primary osteoarthritis, left knee: Secondary | ICD-10-CM | POA: Diagnosis not present

## 2024-01-12 DIAGNOSIS — R9089 Other abnormal findings on diagnostic imaging of central nervous system: Secondary | ICD-10-CM | POA: Diagnosis not present

## 2024-01-12 DIAGNOSIS — Z79899 Other long term (current) drug therapy: Secondary | ICD-10-CM | POA: Insufficient documentation

## 2024-01-12 DIAGNOSIS — Z743 Need for continuous supervision: Secondary | ICD-10-CM | POA: Diagnosis not present

## 2024-01-12 DIAGNOSIS — R079 Chest pain, unspecified: Secondary | ICD-10-CM | POA: Diagnosis not present

## 2024-01-12 DIAGNOSIS — S0990XA Unspecified injury of head, initial encounter: Secondary | ICD-10-CM | POA: Diagnosis not present

## 2024-01-12 DIAGNOSIS — Z720 Tobacco use: Secondary | ICD-10-CM | POA: Insufficient documentation

## 2024-01-12 DIAGNOSIS — R6889 Other general symptoms and signs: Secondary | ICD-10-CM | POA: Diagnosis not present

## 2024-01-12 DIAGNOSIS — S0993XA Unspecified injury of face, initial encounter: Secondary | ICD-10-CM | POA: Diagnosis not present

## 2024-01-12 LAB — CBC
HCT: 31.5 % — ABNORMAL LOW (ref 36.0–46.0)
Hemoglobin: 10.8 g/dL — ABNORMAL LOW (ref 12.0–15.0)
MCH: 32.8 pg (ref 26.0–34.0)
MCHC: 34.3 g/dL (ref 30.0–36.0)
MCV: 95.7 fL (ref 80.0–100.0)
Platelets: 176 10*3/uL (ref 150–400)
RBC: 3.29 MIL/uL — ABNORMAL LOW (ref 3.87–5.11)
RDW: 13.4 % (ref 11.5–15.5)
WBC: 9.7 10*3/uL (ref 4.0–10.5)
nRBC: 0 % (ref 0.0–0.2)

## 2024-01-12 LAB — BASIC METABOLIC PANEL
Anion gap: 9 (ref 5–15)
BUN: 9 mg/dL (ref 8–23)
CO2: 21 mmol/L — ABNORMAL LOW (ref 22–32)
Calcium: 7.8 mg/dL — ABNORMAL LOW (ref 8.9–10.3)
Chloride: 101 mmol/L (ref 98–111)
Creatinine, Ser: 0.75 mg/dL (ref 0.44–1.00)
GFR, Estimated: >60 mL/min (ref >60)
Glucose, Bld: 164 mg/dL — ABNORMAL HIGH (ref 70–99)
Potassium: 3.2 mmol/L — ABNORMAL LOW (ref 3.5–5.1)
Sodium: 131 mmol/L — ABNORMAL LOW (ref 135–145)

## 2024-01-12 LAB — ETHANOL: Alcohol, Ethyl (B): <10 mg/dL (ref <10)

## 2024-01-12 MED ORDER — MORPHINE SULFATE (PF) 4 MG/ML IV SOLN
4.0000 mg | Freq: Once | INTRAVENOUS | Status: AC
  Administered 2024-01-12: 4 mg via INTRAVENOUS
  Filled 2024-01-12: qty 1

## 2024-01-12 MED ORDER — IOHEXOL 300 MG/ML  SOLN
100.0000 mL | Freq: Once | INTRAMUSCULAR | Status: AC | PRN
  Administered 2024-01-12: 100 mL via INTRAVENOUS

## 2024-01-12 NOTE — ED Provider Notes (Signed)
 [place] [facility] AT [facility] Provider Note   CSN: [id number] Arrival date & time: 01/12/24  1454     History  No chief complaint on file.   [name] is a 73 y.o. female.  HPI    This patient is a 73 year old female, she has a history of anxiety on clonazepam, chronic pain on gabapentin, she takes trazodone at night and uses a muscle relaxer tizanidine.  She is also on tamoxifen.  She presents with a complaint of left-sided rib pain stating that she falls frequently.  She was actually seen in the emergency department several days ago during which time she had had a fall striking her head, she had multiple small lacerations 1 of which was repaired in the right frontal forehead, she had CT scans of the head and cervical spine and was discharged home.  She states that she fell again last night striking her left ribs and has been having pain which is worse with taking a deep breath.  No other injuries except for chronic left knee pain which she states is what causes all of this as she gives out when she walks   Home Medications Prior to Admission medications   Medication Sig Start Date End Date Taking? Authorizing Provider  acetaminophen (TYLENOL) 325 MG tablet Take 2 tablets (650 mg total) by mouth every 6 (six) hours as needed. 09/10/23   Sloan Leiter, DO  albuterol (PROVENTIL) (2.5 MG/3ML) 0.083% nebulizer solution Take 2.5 mg by nebulization every Monday, Wednesday, and Friday.    [provider]  albuterol (VENTOLIN HFA) 108 (90 Base) MCG/ACT inhaler Inhale 2 puffs into the lungs every 6 (six) hours as needed for wheezing or shortness of breath.    [provider]  Aromatic Inhalants (VICKS VAPOINHALER IN) Inhale 1 puff into the lungs daily as needed (congestion).    [provider]  atorvastatin (LIPITOR) 20 MG tablet Take 20 mg by mouth every evening.  01/08/19   [provider]  carboxymethylcellulose (REFRESH  PLUS) 0.5 % SOLN Place 1 drop into both eyes 3 (three) times daily as needed (dry/irritated eyes.).    [provider]  clonazePAM (KLONOPIN) 1 MG tablet Take 1 tablet (1 mg total) by mouth 3 (three) times daily. Patient taking differently: Take 1 mg by mouth 4 (four) times daily as needed for anxiety. 04/26/19   Vassie Loll, MD  dicyclomine (BENTYL) 10 MG capsule Take 10 mg by mouth 2 (two) times daily.    [provider]  fluconazole (DIFLUCAN) 100 MG tablet Take 2 tablets (200mg ) the first day and then 1 tablet once a day for the next 21 days. Patient not taking: Reported on 08/15/2022 07/06/22   Corbin Ade, MD  gabapentin (NEURONTIN) 300 MG capsule TAKE (1) CAPSULE BY MOUTH THREE TIMES DAILY 02/06/23   Doreatha Massed, MD  hydrOXYzine (ATARAX) 25 MG tablet Take 25 mg by mouth 2 (two) times daily. 09/10/22   [provider]  ibuprofen (ADVIL) 600 MG tablet Take 1 tablet (600 mg total) by mouth every 6 (six) hours as needed. 09/10/23   Tanda Rockers A, DO  lidocaine (LIDODERM) 5 % Place 1 patch onto the skin daily as needed. Remove & Discard patch within 12 hours or as directed by MD 09/10/23   Sloan Leiter, DO  Multiple Vitamin (MULTIVITAMIN WITH MINERALS) TABS tablet Take 1 tablet by mouth 3 (three) times a week.    [provider]  omeprazole (  PRILOSEC) 40 MG capsule Take 40 mg by mouth daily.  10/04/16   [provider]  oxyCODONE (ROXICODONE) 5 MG immediate release tablet Take 1 tablet (5 mg total) by mouth every 6 (six) hours as needed for severe pain (pain score 7-10). 09/10/23   Sloan Leiter, DO  phenytoin (DILANTIN) 100 MG ER capsule Take 400 mg by mouth at bedtime.    [provider]  predniSONE (STERAPRED UNI-PAK 21 TAB) 10 MG (21) TBPK tablet 10mg  Tabs, 6 day taper. Use as directed 03/31/23   Pollyann Savoy, MD  STIOLTO RESPIMAT 2.5-2.5 MCG/ACT AERS Inhale 2 puffs into the lungs daily. 06/25/22   [provider]   tamoxifen (NOLVADEX) 20 MG tablet TAKE ONE TABLET BY MOUTH ONCE DAILY. 11/15/22   Doreatha Massed, MD  tiZANidine (ZANAFLEX) 4 MG tablet Take 4 mg by mouth 2 (two) times daily. 08/22/18   [provider]  traZODone (DESYREL) 150 MG tablet Take 225 mg by mouth at bedtime.    [provider]      Allergies    Varenicline, Codeine, Influenza virus vaccine, Lithium, and Haemophilus b polysaccharide vaccine    Review of Systems   Review of Systems  All other systems reviewed and are negative.   Physical Exam Updated Vital Signs BP 128/60   Pulse 91   Temp 99 F (37.2 C)   Resp 16   SpO2 96%  Physical Exam Vitals and nursing note reviewed.  Constitutional:      General: She is not in acute distress.    Appearance: She is well-developed.  HENT:     Head: Normocephalic.     Comments: Small ecchymosis around the bilateral eyes, laceration to the right forehead repaired and intact    Mouth/Throat:     Pharynx: No oropharyngeal exudate.  Eyes:     General: No scleral icterus.       Right eye: No discharge.        Left eye: No discharge.     Conjunctiva/sclera: Conjunctivae normal.     Pupils: Pupils are equal, round, and reactive to light.  Neck:     Thyroid: No thyromegaly.     Vascular: No JVD.  Cardiovascular:     Rate and Rhythm: Normal rate and regular rhythm.     Heart sounds: Normal heart sounds. No murmur heard.    No friction rub. No gallop.  Pulmonary:     Effort: Pulmonary effort is normal. No respiratory distress.     Breath sounds: Wheezing present. No rales.     Comments: Decreased lung sounds, mild expiratory wheezing, tenderness over the left chest with some bruising present Chest:     Chest wall: Tenderness present.  Abdominal:     General: Bowel sounds are normal. There is no distension.     Palpations: Abdomen is soft. There is no mass.     Tenderness: There is no abdominal tenderness.  Musculoskeletal:        General: Tenderness  present. Normal range of motion.     Cervical back: Normal range of motion and neck supple.     Right lower leg: No edema.     Left lower leg: No edema.     Comments: Mild tenderness with left knee range of motion, no obvious asymmetry crepitus or signs of injury or deformity  Lymphadenopathy:     Cervical: No cervical adenopathy.  Skin:    General: Skin is warm and dry.     Findings:  No erythema or rash.  Neurological:     Mental Status: She is alert.     Coordination: Coordination normal.     Comments: The patient is awake alert and answering questions appropriately, moving all 4 extremities  Psychiatric:        Behavior: Behavior normal.     ED Results / Procedures / Treatments   Labs (all labs ordered are listed, but only abnormal results are displayed) Labs Reviewed  CBC - Abnormal; Notable for the following components:      Result Value   RBC 3.29 (*)    Hemoglobin 10.8 (*)    HCT 31.5 (*)    All other components within normal limits  BASIC METABOLIC PANEL - Abnormal; Notable for the following components:   Sodium 131 (*)    Potassium 3.2 (*)    CO2 21 (*)    Glucose, Bld 164 (*)    Calcium 7.8 (*)    All other components within normal limits  ETHANOL    EKG EKG Interpretation Date/Time:  Sunday January 12 2024 15:41:33 EDT Ventricular Rate:  89 PR Interval:  165 QRS Duration:  103 QT Interval:  358 QTC Calculation: 436 R Axis:   83  Text Interpretation: Sinus rhythm Borderline right axis deviation Nonspecific T abnrm, anterolateral leads Confirmed by Eber Hong ([id number]) on 01/12/2024 3:47:39 PM  Radiology CT CHEST ABDOMEN PELVIS W CONTRAST Result Date: 01/12/2024 CLINICAL DATA:  Larey Seat yesterday, multiple bruises and laceration, left lower rib cage pain EXAM: CT CHEST, ABDOMEN, AND PELVIS WITH CONTRAST TECHNIQUE: Multidetector CT imaging of the chest, abdomen and pelvis was performed following the standard protocol during bolus administration of intravenous  contrast. RADIATION DOSE REDUCTION: This exam was performed according to the departmental dose-optimization program which includes automated exposure control, adjustment of the mA and/or kV according to patient size and/or use of iterative reconstruction technique. CONTRAST:  OMNIPAQUE IOHEXOL 300 MG/ML  SOLN COMPARISON:  09/10/2023 FINDINGS: CT CHEST FINDINGS Cardiovascular: The heart is unremarkable without pericardial effusion. No evidence of thoracic aortic aneurysm or dissection. No evidence of vascular injury. Atherosclerosis of the aorta and coronary vasculature. Mediastinum/Nodes: No enlarged mediastinal, hilar, or axillary lymph nodes. Thyroid gland, trachea, and esophagus demonstrate no significant findings. Lungs/Pleura: Upper lobe predominant emphysema. No acute airspace disease, effusion, or pneumothorax. Central airways are patent. 4 mm left upper lobe pulmonary nodule reference image 92/3. Musculoskeletal: There is a subacute healing left anterior eleventh rib fracture, with moderate callus formation. No other acute displaced fractures. No destructive bony lesions. Reconstructed images demonstrate no additional findings. CT ABDOMEN PELVIS FINDINGS Hepatobiliary: Prior cholecystectomy. The common bile duct is dilated measuring 16 mm, may be physiologic after cholecystectomy. Mild intrahepatic biliary duct dilation. No evidence of choledocholithiasis. The liver is unremarkable. Pancreas: Unremarkable. No pancreatic ductal dilatation or surrounding inflammatory changes. Spleen: No splenic injury or perisplenic hematoma. Adrenals/Urinary Tract: No evidence of acute renal injury. No urinary tract calculi or obstructive uropathy. The right adrenal is unremarkable. Stable left adrenal adenoma measuring 3.6 x 2.5 cm. Bladder is unremarkable. Stomach/Bowel: No bowel obstruction or ileus. No bowel wall thickening or inflammatory change. Vascular/Lymphatic: Aortic atherosclerosis. No enlarged abdominal or  pelvic lymph nodes. Reproductive: Status post hysterectomy. No adnexal masses. Other: No free fluid or free intraperitoneal gas. No abdominal wall hernia. Musculoskeletal: Right hip ORIF identified. There are no acute or destructive bony abnormalities. Moderate bilateral hip osteoarthritis. Reconstructed images demonstrate no additional findings. IMPRESSION: 1. Subacute healing left anterior eleventh rib fracture, with  moderate callus formation. No acute bony abnormalities. 2. Otherwise no acute intrathoracic, intra-abdominal, or intrapelvic trauma. 3. Left solid pulmonary nodule within the upper lobe measuring 4 mm. Per Fleischner Society Guidelines,if patient is low risk for malignancy, no routine follow-up imaging is recommended. If patient is high risk for malignancy, a non-contrast Chest CT at 12 months is optional. If performed and the nodule is stable at 12 months, no further follow-up is recommended. These guidelines do not apply to immunocompromised patients and patients with cancer. Follow up in patients with significant comorbidities as clinically warranted. For lung cancer screening, adhere to Lung-RADS guidelines. Reference: Radiology. 2017; 284(1):228-43. 4. Cholecystectomy, with stable postsurgical biliary dilation. 5. Aortic Atherosclerosis (ICD10-I70.0) and Emphysema (ICD10-J43.9). Electronically Signed   By: Sharlet Salina M.D.   On: 01/12/2024 16:44   CT Head Wo Contrast Result Date: 01/12/2024 CLINICAL DATA:  Polytrauma, blunt EXAM: CT HEAD WITHOUT CONTRAST CT MAXILLOFACIAL WITHOUT CONTRAST TECHNIQUE: Multidetector CT imaging of the head and maxillofacial structures were performed using the standard protocol without intravenous contrast. Multiplanar CT image reconstructions of the maxillofacial structures were also generated. RADIATION DOSE REDUCTION: This exam was performed according to the departmental dose-optimization program which includes automated exposure control, adjustment of the mA  and/or kV according to patient size and/or use of iterative reconstruction technique. COMPARISON:  01/09/2024 FINDINGS: CT HEAD FINDINGS Brain: No evidence of acute infarction, hemorrhage, hydrocephalus, extra-axial collection or mass lesion/mass effect. Scattered low-density changes within the periventricular and subcortical white matter most compatible with chronic microvascular ischemic change. Mild diffuse cerebral volume loss. Vascular: Atherosclerotic calcifications involving the large vessels of the skull base. No unexpected hyperdense vessel. Skull: Normal. Negative for fracture or focal lesion. Other: Negative for scalp hematoma. CT MAXILLOFACIAL FINDINGS Osseous: No acute maxillofacial bone fracture. Bony orbital walls are intact. Mandible intact. Anterior translation of both temporomandibular joints is likely positional. Advanced degenerative changes of both TMJs. Orbits: Negative. No traumatic or inflammatory finding. Sinuses: Clear. Soft tissues: Negative. IMPRESSION: 1. No acute intracranial abnormality. 2. No acute maxillofacial bone fracture. 3. Anterior translation of both temporomandibular joints is likely positional. Correlate with exam. Electronically Signed   By: Duanne Guess D.O.   On: 01/12/2024 16:40   CT Maxillofacial Wo Contrast Result Date: 01/12/2024 CLINICAL DATA:  Polytrauma, blunt EXAM: CT HEAD WITHOUT CONTRAST CT MAXILLOFACIAL WITHOUT CONTRAST TECHNIQUE: Multidetector CT imaging of the head and maxillofacial structures were performed using the standard protocol without intravenous contrast. Multiplanar CT image reconstructions of the maxillofacial structures were also generated. RADIATION DOSE REDUCTION: This exam was performed according to the departmental dose-optimization program which includes automated exposure control, adjustment of the mA and/or kV according to patient size and/or use of iterative reconstruction technique. COMPARISON:  01/09/2024 FINDINGS: CT HEAD  FINDINGS Brain: No evidence of acute infarction, hemorrhage, hydrocephalus, extra-axial collection or mass lesion/mass effect. Scattered low-density changes within the periventricular and subcortical white matter most compatible with chronic microvascular ischemic change. Mild diffuse cerebral volume loss. Vascular: Atherosclerotic calcifications involving the large vessels of the skull base. No unexpected hyperdense vessel. Skull: Normal. Negative for fracture or focal lesion. Other: Negative for scalp hematoma. CT MAXILLOFACIAL FINDINGS Osseous: No acute maxillofacial bone fracture. Bony orbital walls are intact. Mandible intact. Anterior translation of both temporomandibular joints is likely positional. Advanced degenerative changes of both TMJs. Orbits: Negative. No traumatic or inflammatory finding. Sinuses: Clear. Soft tissues: Negative. IMPRESSION: 1. No acute intracranial abnormality. 2. No acute maxillofacial bone fracture. 3. Anterior translation of both temporomandibular joints is  likely positional. Correlate with exam. Electronically Signed   By: Duanne Guess D.O.   On: 01/12/2024 16:40   DG Knee Complete 4 Views Left Result Date: 01/12/2024 CLINICAL DATA:  Left knee pain.  Fall last night. EXAM: LEFT KNEE - COMPLETE 4+ VIEW COMPARISON:  None Available. FINDINGS: No fracture or bone lesion. Knee joint normally spaced and aligned. Small marginal osteophytes from the medial compartment and patella. No joint effusion. Skeletal structures are demineralized. Posterior vascular calcifications. IMPRESSION: 1. No fracture or acute finding. 2. Mild degenerative changes. Electronically Signed   By: Amie Portland M.D.   On: 01/12/2024 15:32    Procedures Procedures    Medications Ordered in ED Medications  morphine (PF) 4 MG/ML injection 4 mg (4 mg Intravenous Given 01/12/24 1516)  iohexol (OMNIPAQUE) 300 MG/ML solution 100 mL (100 mLs Intravenous Contrast Given 01/12/24 1613)    ED Course/  Medical Decision Making/ A&P                                 Medical Decision Making Amount and/or Complexity of Data Reviewed Labs: ordered. Radiology: ordered.  Risk Prescription drug management.    This patient presents to the ED for concern of recurrent trauma, this involves an extensive number of treatment options, and is a complaint that carries with it a high risk of complications and morbidity.  The differential diagnosis includes fracture of the ribs, pneumothorax, possible knee injury, she denies recurrent head injury but with falls and some memory lapses during the exam will repeat imaging, there is no corroborating historian is here to talk about the fall   Co morbidities that complicate the patient evaluation  Frequent falls   Additional history obtained:  Additional history obtained from imaging of head and cervical spine from several days ago External records from outside source obtained and reviewed including as above   Lab Tests:  I Ordered, and personally interpreted labs.  The pertinent results include: And no leukocytosis, normal CBC   Imaging Studies ordered:  I ordered imaging studies including x-ray of the left knee as well as CT scans of the head maxillofacial bones chest abdomen pelvis with a healing left rib fracture I independently visualized and interpreted imaging which showed no other acute intra-abdominal or thoracic injuries I agree with the radiologist interpretation   Cardiac Monitoring: / EKG:  The patient was maintained on a cardiac monitor.  I personally viewed and interpreted the cardiac monitored which showed an underlying rhythm of: Sinus rhythm    Problem List / ED Course / Critical interventions / Medication management  Patient informed of results, no other acute injuries, likely contusion over the bone that had previously been fractured but no new fracture seen I ordered medication including morphine for pain Reevaluation of  the patient after these medicines showed that the patient improved I have reviewed the patients home medicines and have made adjustments as needed   Social Determinants of Health:  Chronic tobacco use   Test / Admission - Considered:  Admission but no acute findings for admission         Final Clinical Impression(s) / ED Diagnoses Final diagnoses:  Rib contusion, left, initial encounter    Rx / DC Orders ED Discharge Orders     None         Eber Hong, MD 01/12/24 1652

## 2024-01-12 NOTE — Discharge Instructions (Signed)
 Your testing shows that you have a previously fractured rib where you are hurting, it is already starting to heal, this is something that has not happened last night but is happened in the past.  There was no other injuries  Tylenol or ibuprofen for pain  See your doctor in 3 days for follow-up

## 2024-01-12 NOTE — ED Notes (Signed)
 Patient Alert and oriented to baseline. Stable and ambulatory to baseline. Patient verbalized understanding of the discharge instructions.  Patient belongings were taken by the patient.

## 2024-01-12 NOTE — ED Notes (Signed)
 Pt returned from imaging.

## 2024-01-12 NOTE — ED Triage Notes (Signed)
 Pt here with reports of L lower ribcage pain. Pt endorses fall last night. Multiple bruises and lacerations noted. Pt denies LOC, denies blood thinners.

## 2024-01-28 ENCOUNTER — Other Ambulatory Visit: Payer: Self-pay

## 2024-01-28 ENCOUNTER — Emergency Department (HOSPITAL_COMMUNITY)
Admission: EM | Admit: 2024-01-28 | Discharge: 2024-01-28 | Disposition: A | Discharge status: Home / Self Care | Attending: Emergency Medicine | Admitting: Emergency Medicine

## 2024-01-28 ENCOUNTER — Emergency Department (HOSPITAL_COMMUNITY)

## 2024-01-28 ENCOUNTER — Encounter (HOSPITAL_COMMUNITY): Payer: Self-pay

## 2024-01-28 DIAGNOSIS — X58XXXA Exposure to other specified factors, initial encounter: Secondary | ICD-10-CM | POA: Insufficient documentation

## 2024-01-28 DIAGNOSIS — M25552 Pain in left hip: Secondary | ICD-10-CM

## 2024-01-28 DIAGNOSIS — R609 Edema, unspecified: Secondary | ICD-10-CM | POA: Diagnosis not present

## 2024-01-28 DIAGNOSIS — S79912A Unspecified injury of left hip, initial encounter: Secondary | ICD-10-CM | POA: Diagnosis present

## 2024-01-28 DIAGNOSIS — S72112A Displaced fracture of greater trochanter of left femur, initial encounter for closed fracture: Secondary | ICD-10-CM | POA: Diagnosis not present

## 2024-01-28 DIAGNOSIS — S72102A Unspecified trochanteric fracture of left femur, initial encounter for closed fracture: Secondary | ICD-10-CM

## 2024-01-28 DIAGNOSIS — J449 Chronic obstructive pulmonary disease, unspecified: Secondary | ICD-10-CM | POA: Diagnosis not present

## 2024-01-28 DIAGNOSIS — M1712 Unilateral primary osteoarthritis, left knee: Secondary | ICD-10-CM | POA: Diagnosis not present

## 2024-01-28 DIAGNOSIS — M160 Bilateral primary osteoarthritis of hip: Secondary | ICD-10-CM | POA: Diagnosis not present

## 2024-01-28 NOTE — ED Provider Notes (Signed)
 [place] EMERGENCY DEPARTMENT AT [facility] Provider Note   CSN: [id number] Arrival date & time: 01/28/24  1303     History  Chief Complaint  Patient presents with   Hip Pain    [name] is a 73 y.o. female with a history including COPD, GERD, CVA and history of seizures on phenytoin, presenting with left hip pain x 2 days.  She denies any new fall or injury stating she was simply walking 2 days ago (using a walker) when she developed pain in the groin but also the out upper thigh region.  She denies fevers, chills, skin erythema, rash,  there is no radiation of pain.  Worse with weight bearing and movement, particularly in the outer thigh with movement.   The history is provided by the patient.       Home Medications Prior to Admission medications   Medication Sig Start Date End Date Taking? Authorizing Provider  acetaminophen (TYLENOL) 325 MG tablet Take 2 tablets (650 mg total) by mouth every 6 (six) hours as needed. 09/10/23   Sloan Leiter, DO  albuterol (PROVENTIL) (2.5 MG/3ML) 0.083% nebulizer solution Take 2.5 mg by nebulization every Monday, Wednesday, and Friday.    [provider]  albuterol (VENTOLIN HFA) 108 (90 Base) MCG/ACT inhaler Inhale 2 puffs into the lungs every 6 (six) hours as needed for wheezing or shortness of breath.    [provider]  Aromatic Inhalants (VICKS VAPOINHALER IN) Inhale 1 puff into the lungs daily as needed (congestion).    [provider]  atorvastatin (LIPITOR) 20 MG tablet Take 20 mg by mouth every evening.  01/08/19   [provider]  carboxymethylcellulose (REFRESH PLUS) 0.5 % SOLN Place 1 drop into both eyes 3 (three) times daily as needed (dry/irritated eyes.).    [provider]  clonazePAM (KLONOPIN) 1 MG tablet Take 1 tablet (1 mg total) by mouth 3 (three) times daily. Patient taking differently: Take 1 mg by mouth 4 (four) times daily as needed for anxiety. 04/26/19    Vassie Loll, MD  dicyclomine (BENTYL) 10 MG capsule Take 10 mg by mouth 2 (two) times daily.    [provider]  fluconazole (DIFLUCAN) 100 MG tablet Take 2 tablets (200mg ) the first day and then 1 tablet once a day for the next 21 days. Patient not taking: Reported on 08/15/2022 07/06/22   Corbin Ade, MD  gabapentin (NEURONTIN) 300 MG capsule TAKE (1) CAPSULE BY MOUTH THREE TIMES DAILY 02/06/23   Doreatha Massed, MD  hydrOXYzine (ATARAX) 25 MG tablet Take 25 mg by mouth 2 (two) times daily. 09/10/22   [provider]  ibuprofen (ADVIL) 600 MG tablet Take 1 tablet (600 mg total) by mouth every 6 (six) hours as needed. 09/10/23   Tanda Rockers A, DO  lidocaine (LIDODERM) 5 % Place 1 patch onto the skin daily as needed. Remove & Discard patch within 12 hours or as directed by MD 09/10/23   Sloan Leiter, DO  Multiple Vitamin (MULTIVITAMIN WITH MINERALS) TABS tablet Take 1 tablet by mouth 3 (three) times a week.    [provider]  omeprazole (PRILOSEC) 40 MG capsule Take 40 mg by mouth daily.  10/04/16   [provider]  oxyCODONE (ROXICODONE) 5 MG immediate release tablet Take 1 tablet (5 mg total) by mouth every 6 (six) hours as needed for severe pain (pain score 7-10). 09/10/23   Sloan Leiter, DO  phenytoin (DILANTIN) 100 MG  ER capsule Take 400 mg by mouth at bedtime.    [provider]  predniSONE (STERAPRED UNI-PAK 21 TAB) 10 MG (21) TBPK tablet 10mg  Tabs, 6 day taper. Use as directed 03/31/23   Pollyann Savoy, MD  STIOLTO RESPIMAT 2.5-2.5 MCG/ACT AERS Inhale 2 puffs into the lungs daily. 06/25/22   [provider]  tamoxifen (NOLVADEX) 20 MG tablet TAKE ONE TABLET BY MOUTH ONCE DAILY. 11/15/22   Doreatha Massed, MD  tiZANidine (ZANAFLEX) 4 MG tablet Take 4 mg by mouth 2 (two) times daily. 08/22/18   [provider]  traZODone (DESYREL) 150 MG tablet Take 225 mg by mouth at bedtime.    [provider]       Allergies    Varenicline, Codeine, Influenza virus vaccine, Lithium, and Haemophilus b polysaccharide vaccine    Review of Systems   Review of Systems  Constitutional:  Negative for chills and fever.  Musculoskeletal:  Positive for arthralgias. Negative for joint swelling and myalgias.  Skin: Negative.  Negative for color change, rash and wound.  Neurological:  Negative for weakness and numbness.    Physical Exam Updated Vital Signs BP 136/68 (BP Location: Right Arm)   Pulse 93   Temp 99.3 F (37.4 C) (Oral)   Resp 18   Ht 5\' 3"  (1.6 m)   Wt 56.7 kg   SpO2 97%   BMI 22.14 kg/m  Physical Exam Constitutional:      Appearance: She is well-developed.  HENT:     Head: Atraumatic.  Cardiovascular:     Pulses:          Dorsalis pedis pulses are 2+ on the right side and 2+ on the left side.     Comments: Pulses equal bilaterally, Musculoskeletal:        General: Tenderness present.     Cervical back: Normal range of motion.     Left upper leg: Bony tenderness present.     Left knee: Bony tenderness present. No swelling, effusion, erythema or crepitus.     Comments: Ttp over left hip greater trochanter.  Mild soreness lateral mid thigh, worsens at left lateral distal femur. No edema or erythema.  Skin:    General: Skin is warm and dry.  Neurological:     Mental Status: She is alert.     Sensory: No sensory deficit.     Motor: No weakness.     Deep Tendon Reflexes: Reflexes normal.     ED Results / Procedures / Treatments   Labs (all labs ordered are listed, but only abnormal results are displayed) Labs Reviewed - No data to display  EKG None  Radiology DG Knee Complete 4 Views Left Result Date: 01/28/2024 CLINICAL DATA:  Left hip pain for 2 days EXAM: LEFT KNEE - COMPLETE 4+ VIEW COMPARISON:  01/12/2024 FINDINGS: Frontal, bilateral oblique, lateral views of the left knee are obtained. No acute fracture, subluxation, or dislocation. 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 No joint effusion. Soft tissues are unremarkable. IMPRESSION: 1. Stable osteoarthritis.  No acute fracture. Electronically Signed   By: Sharlet Salina M.D.   On: 01/28/2024 16:11   DG Hip Unilat W or Wo Pelvis 2-3 Views Left Result Date: 01/28/2024 CLINICAL DATA:  Left hip pain for 2 days EXAM: DG HIP (WITH OR WITHOUT PELVIS) 2-3V LEFT COMPARISON:  05/17/2008 FINDINGS: Frontal view of the pelvis as well as frontal and frogleg lateral views of the left hip are obtained. There is a minimally displaced  and comminuted fracture of the superior margin of the left greater trochanter, with a proximally 6 mm of separation of the fracture fragment. Overlying subcutaneous edema. No other acute displaced fractures. Prior ORIF of the proximal right femur. Symmetrical bilateral hip osteoarthritis. IMPRESSION: 1. Mildly comminuted and displaced fracture off the superior aspect of the left greater trochanter, with approximately 6 mm of separation of the fracture fragment. This fracture does not involve the intertrochanteric region or the femoral neck. 2. Soft tissue edema overlying the left hip. 3. Symmetrical bilateral hip osteoarthritis. Electronically Signed   By: Sharlet Salina M.D.   On: 01/28/2024 16:10    Procedures Procedures    Medications Ordered in ED Medications - No data to display  ED Course/ Medical Decision Making/ A&P                                 Medical Decision Making Patient presents with left hip pain which started suddenly with ambulation 2 days ago, she denies injury or fall.  Pain has been persistent, better at rest worse with movement and weightbearing.  Differential including bursitis, tendinitis, osteoarthritis, fracture.  Normal skin exam, no evidence for infectious process, no rash to suggest shingles.  I was informed by nursing staff that patient left shortly after getting x-rays obtained but not resulted.  Amount and/or Complexity of Data  Reviewed Radiology: ordered.           Final Clinical Impression(s) / ED Diagnoses Final diagnoses:  Left hip pain  Closed fracture of trochanter of left femur, initial encounter [facility])    Rx / DC Orders ED Discharge Orders     None         Victoriano Lain 01/28/24 2332    Gloris Manchester, MD 01/29/24 [phone]

## 2024-01-28 NOTE — ED Triage Notes (Signed)
 Pt arrived via POV c/o left hip X 2 days. Pt has limited mobility in her left leg in Triage. Pt denies injury or fall.
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
## 2024-01-31 DIAGNOSIS — R0602 Shortness of breath: Secondary | ICD-10-CM | POA: Diagnosis not present

## 2024-01-31 DIAGNOSIS — R6889 Other general symptoms and signs: Secondary | ICD-10-CM | POA: Diagnosis not present

## 2024-01-31 DIAGNOSIS — Z743 Need for continuous supervision: Secondary | ICD-10-CM | POA: Diagnosis not present

## 2024-02-04 DIAGNOSIS — I10 Essential (primary) hypertension: Secondary | ICD-10-CM | POA: Diagnosis not present

## 2024-02-04 DIAGNOSIS — J449 Chronic obstructive pulmonary disease, unspecified: Secondary | ICD-10-CM | POA: Diagnosis not present

## 2024-02-04 DIAGNOSIS — M2550 Pain in unspecified joint: Secondary | ICD-10-CM | POA: Diagnosis not present

## 2024-03-06 DIAGNOSIS — R5382 Chronic fatigue, unspecified: Secondary | ICD-10-CM | POA: Diagnosis not present

## 2024-03-06 DIAGNOSIS — J449 Chronic obstructive pulmonary disease, unspecified: Secondary | ICD-10-CM | POA: Diagnosis not present

## 2024-03-06 DIAGNOSIS — J302 Other seasonal allergic rhinitis: Secondary | ICD-10-CM | POA: Diagnosis not present

## 2024-03-06 DIAGNOSIS — E559 Vitamin D deficiency, unspecified: Secondary | ICD-10-CM | POA: Diagnosis not present

## 2024-03-06 DIAGNOSIS — R5383 Other fatigue: Secondary | ICD-10-CM | POA: Diagnosis not present

## 2024-03-06 DIAGNOSIS — R5381 Other malaise: Secondary | ICD-10-CM | POA: Diagnosis not present

## 2024-03-06 DIAGNOSIS — I10 Essential (primary) hypertension: Secondary | ICD-10-CM | POA: Diagnosis not present

## 2024-03-09 ENCOUNTER — Inpatient Hospital Stay (HOSPITAL_COMMUNITY)
Admission: EM | Admit: 2024-03-09 | Discharge: 2024-03-10 | DRG: 189 | Discharge status: Against Medical Advice | Attending: Internal Medicine | Admitting: Internal Medicine

## 2024-03-09 ENCOUNTER — Emergency Department (HOSPITAL_COMMUNITY)

## 2024-03-09 ENCOUNTER — Encounter (HOSPITAL_COMMUNITY): Payer: Self-pay | Admitting: Emergency Medicine

## 2024-03-09 ENCOUNTER — Other Ambulatory Visit: Payer: Self-pay

## 2024-03-09 DIAGNOSIS — Z887 Allergy status to serum and vaccine status: Secondary | ICD-10-CM

## 2024-03-09 DIAGNOSIS — R0902 Hypoxemia: Secondary | ICD-10-CM | POA: Diagnosis not present

## 2024-03-09 DIAGNOSIS — R9431 Abnormal electrocardiogram [ECG] [EKG]: Secondary | ICD-10-CM | POA: Diagnosis not present

## 2024-03-09 DIAGNOSIS — Z79899 Other long term (current) drug therapy: Secondary | ICD-10-CM | POA: Diagnosis not present

## 2024-03-09 DIAGNOSIS — G4700 Insomnia, unspecified: Secondary | ICD-10-CM | POA: Diagnosis not present

## 2024-03-09 DIAGNOSIS — J9601 Acute respiratory failure with hypoxia: Principal | ICD-10-CM | POA: Insufficient documentation

## 2024-03-09 DIAGNOSIS — I6782 Cerebral ischemia: Secondary | ICD-10-CM | POA: Diagnosis not present

## 2024-03-09 DIAGNOSIS — Z853 Personal history of malignant neoplasm of breast: Secondary | ICD-10-CM

## 2024-03-09 DIAGNOSIS — Z79810 Long term (current) use of selective estrogen receptor modulators (SERMs): Secondary | ICD-10-CM | POA: Diagnosis not present

## 2024-03-09 DIAGNOSIS — F17210 Nicotine dependence, cigarettes, uncomplicated: Secondary | ICD-10-CM | POA: Diagnosis present

## 2024-03-09 DIAGNOSIS — K219 Gastro-esophageal reflux disease without esophagitis: Secondary | ICD-10-CM | POA: Diagnosis not present

## 2024-03-09 DIAGNOSIS — Z885 Allergy status to narcotic agent status: Secondary | ICD-10-CM | POA: Diagnosis not present

## 2024-03-09 DIAGNOSIS — E785 Hyperlipidemia, unspecified: Secondary | ICD-10-CM | POA: Diagnosis present

## 2024-03-09 DIAGNOSIS — J441 Chronic obstructive pulmonary disease with (acute) exacerbation: Secondary | ICD-10-CM | POA: Diagnosis not present

## 2024-03-09 DIAGNOSIS — G8929 Other chronic pain: Secondary | ICD-10-CM | POA: Diagnosis present

## 2024-03-09 DIAGNOSIS — G40909 Epilepsy, unspecified, not intractable, without status epilepticus: Secondary | ICD-10-CM | POA: Diagnosis present

## 2024-03-09 DIAGNOSIS — Z5321 Procedure and treatment not carried out due to patient leaving prior to being seen by health care provider: Secondary | ICD-10-CM | POA: Diagnosis present

## 2024-03-09 DIAGNOSIS — Z8673 Personal history of transient ischemic attack (TIA), and cerebral infarction without residual deficits: Secondary | ICD-10-CM | POA: Diagnosis not present

## 2024-03-09 DIAGNOSIS — F419 Anxiety disorder, unspecified: Secondary | ICD-10-CM | POA: Diagnosis present

## 2024-03-09 DIAGNOSIS — J439 Emphysema, unspecified: Secondary | ICD-10-CM | POA: Diagnosis not present

## 2024-03-09 DIAGNOSIS — F319 Bipolar disorder, unspecified: Secondary | ICD-10-CM | POA: Diagnosis present

## 2024-03-09 DIAGNOSIS — R0602 Shortness of breath: Secondary | ICD-10-CM | POA: Diagnosis not present

## 2024-03-09 DIAGNOSIS — I700 Atherosclerosis of aorta: Secondary | ICD-10-CM | POA: Diagnosis not present

## 2024-03-09 DIAGNOSIS — R069 Unspecified abnormalities of breathing: Secondary | ICD-10-CM | POA: Diagnosis not present

## 2024-03-09 LAB — CBC WITH DIFFERENTIAL/PLATELET
Abs Immature Granulocytes: 0.03 10*3/uL (ref 0.00–0.07)
Basophils Absolute: 0.1 10*3/uL (ref 0.0–0.1)
Basophils Relative: 1 %
Eosinophils Absolute: 0.2 10*3/uL (ref 0.0–0.5)
Eosinophils Relative: 3 %
HCT: 38.5 % (ref 36.0–46.0)
Hemoglobin: 13 g/dL (ref 12.0–15.0)
Immature Granulocytes: 0 %
Lymphocytes Relative: 27 %
Lymphs Abs: 2.3 10*3/uL (ref 0.7–4.0)
MCH: 33.1 pg (ref 26.0–34.0)
MCHC: 33.8 g/dL (ref 30.0–36.0)
MCV: 98 fL (ref 80.0–100.0)
Monocytes Absolute: 1.1 10*3/uL — ABNORMAL HIGH (ref 0.1–1.0)
Monocytes Relative: 13 %
Neutro Abs: 4.9 10*3/uL (ref 1.7–7.7)
Neutrophils Relative %: 56 %
Platelets: 317 10*3/uL (ref 150–400)
RBC: 3.93 MIL/uL (ref 3.87–5.11)
RDW: 12.8 % (ref 11.5–15.5)
WBC: 8.6 10*3/uL (ref 4.0–10.5)
nRBC: 0 % (ref 0.0–0.2)

## 2024-03-09 LAB — RAPID URINE DRUG SCREEN, HOSP PERFORMED
Amphetamines: NOT DETECTED
Barbiturates: NOT DETECTED
Benzodiazepines: POSITIVE — AB
Cocaine: NOT DETECTED
Opiates: NOT DETECTED
Tetrahydrocannabinol: NOT DETECTED

## 2024-03-09 LAB — URINALYSIS, ROUTINE W REFLEX MICROSCOPIC
Bilirubin Urine: NEGATIVE
Glucose, UA: NEGATIVE mg/dL
Hgb urine dipstick: NEGATIVE
Ketones, ur: NEGATIVE mg/dL
Leukocytes,Ua: NEGATIVE
Nitrite: NEGATIVE
Protein, ur: NEGATIVE mg/dL
Specific Gravity, Urine: 1.006 (ref 1.005–1.030)
pH: 7 (ref 5.0–8.0)

## 2024-03-09 LAB — BLOOD GAS, VENOUS
Acid-Base Excess: 6.3 mmol/L — ABNORMAL HIGH (ref 0.0–2.0)
Bicarbonate: 33.1 mmol/L — ABNORMAL HIGH (ref 20.0–28.0)
Drawn by: 69862
O2 Saturation: 44.8 %
Patient temperature: 37
pCO2, Ven: 56 mmHg (ref 44–60)
pH, Ven: 7.38 (ref 7.25–7.43)
pO2, Ven: <31 mmHg — CL (ref 32–45)

## 2024-03-09 LAB — BASIC METABOLIC PANEL WITH GFR
Anion gap: 9 (ref 5–15)
BUN: 12 mg/dL (ref 8–23)
CO2: 28 mmol/L (ref 22–32)
Calcium: 8.9 mg/dL (ref 8.9–10.3)
Chloride: 98 mmol/L (ref 98–111)
Creatinine, Ser: 0.74 mg/dL (ref 0.44–1.00)
GFR, Estimated: >60 mL/min (ref >60)
Glucose, Bld: 122 mg/dL — ABNORMAL HIGH (ref 70–99)
Potassium: 4.3 mmol/L (ref 3.5–5.1)
Sodium: 135 mmol/L (ref 135–145)

## 2024-03-09 LAB — BRAIN NATRIURETIC PEPTIDE: B Natriuretic Peptide: 33 pg/mL (ref 0.0–100.0)

## 2024-03-09 LAB — TROPONIN I (HIGH SENSITIVITY): Troponin I (High Sensitivity): 4 ng/L (ref <18)

## 2024-03-09 LAB — ETHANOL: Alcohol, Ethyl (B): <15 mg/dL (ref <15)

## 2024-03-09 MED ORDER — PHENYTOIN SODIUM EXTENDED 100 MG PO CAPS
400.0000 mg | ORAL_CAPSULE | Freq: Every day | ORAL | Status: DC
  Administered 2024-03-10: 400 mg via ORAL
  Filled 2024-03-09: qty 4

## 2024-03-09 MED ORDER — LEVOFLOXACIN 500 MG PO TABS
500.0000 mg | ORAL_TABLET | Freq: Every day | ORAL | Status: DC
  Filled 2024-03-09: qty 1

## 2024-03-09 MED ORDER — BUDESON-GLYCOPYRROL-FORMOTEROL 160-9-4.8 MCG/ACT IN AERO
2.0000 | INHALATION_SPRAY | Freq: Two times a day (BID) | RESPIRATORY_TRACT | Status: DC
  Administered 2024-03-10: 2 via RESPIRATORY_TRACT
  Filled 2024-03-09 (×2): qty 5.9

## 2024-03-09 MED ORDER — CLONAZEPAM 0.5 MG PO TABS: 1.0000 mg | ORAL_TABLET | Freq: Three times a day (TID) | ORAL | Status: DC | PRN

## 2024-03-09 MED ORDER — DULOXETINE HCL 20 MG PO CPEP
20.0000 mg | ORAL_CAPSULE | Freq: Every day | ORAL | Status: DC
  Filled 2024-03-09: qty 1

## 2024-03-09 MED ORDER — ENOXAPARIN SODIUM 40 MG/0.4ML IJ SOSY
40.0000 mg | PREFILLED_SYRINGE | INTRAMUSCULAR | Status: DC
  Filled 2024-03-09: qty 0.4

## 2024-03-09 MED ORDER — GABAPENTIN 300 MG PO CAPS
300.0000 mg | ORAL_CAPSULE | Freq: Three times a day (TID) | ORAL | Status: DC
  Filled 2024-03-09: qty 1

## 2024-03-09 MED ORDER — IPRATROPIUM-ALBUTEROL 0.5-2.5 (3) MG/3ML IN SOLN
6.0000 mL | Freq: Once | RESPIRATORY_TRACT | Status: AC
  Administered 2024-03-09: 6 mL via RESPIRATORY_TRACT
  Filled 2024-03-09: qty 6

## 2024-03-09 MED ORDER — PANTOPRAZOLE SODIUM 40 MG PO TBEC
40.0000 mg | DELAYED_RELEASE_TABLET | Freq: Every day | ORAL | Status: DC
Start: 2024-03-10 — End: 2024-03-10
  Filled 2024-03-09: qty 1

## 2024-03-09 MED ORDER — ATORVASTATIN CALCIUM 20 MG PO TABS
20.0000 mg | ORAL_TABLET | Freq: Every evening | ORAL | Status: DC
Start: 2024-03-10 — End: 2024-03-10

## 2024-03-09 MED ORDER — SODIUM CHLORIDE 0.9% FLUSH: 3.0000 mL | Freq: Two times a day (BID) | INTRAVENOUS | Status: DC

## 2024-03-09 MED ORDER — ALBUTEROL SULFATE (2.5 MG/3ML) 0.083% IN NEBU: 2.5000 mg | INHALATION_SOLUTION | RESPIRATORY_TRACT | Status: DC | PRN

## 2024-03-09 MED ORDER — IPRATROPIUM-ALBUTEROL 0.5-2.5 (3) MG/3ML IN SOLN
3.0000 mL | Freq: Four times a day (QID) | RESPIRATORY_TRACT | Status: DC
  Administered 2024-03-10 (×2): 3 mL via RESPIRATORY_TRACT
  Filled 2024-03-09 (×2): qty 3

## 2024-03-09 MED ORDER — PREDNISONE 20 MG PO TABS
40.0000 mg | ORAL_TABLET | Freq: Every day | ORAL | Status: DC
  Filled 2024-03-09: qty 2

## 2024-03-09 NOTE — ED Triage Notes (Signed)
 Ems called out for sob, when they arrived pt was 90% on room air. They have her albuterol  and 125mg  solumedrol.

## 2024-03-09 NOTE — Progress Notes (Signed)
 Came in to give patient her breathing treatment and sat was bouncing between 88 and 89%.  Placed patient back on [place] 2L after treatment.

## 2024-03-09 NOTE — ED Provider Notes (Incomplete)
 [place] EMERGENCY DEPARTMENT AT [facility] Provider Note   CSN: [id number] Arrival date & time: 03/09/24  1737     History {Add pertinent medical, surgical, social history, OB history to HPI:1} Chief Complaint  Patient presents with   Shortness of Breath    [name] is a 73 y.o. female.  She has history of seizures, bipolar disorder, CVA, GERD, COPD.  Presents the ER today for evaluation of shortness of breath and wheezing that started this afternoon at home.  She called EMS to transport her to the ED, was saturating 90% on room air.  She had 1 albuterol  treatment and around 805 mg of Solu-Medrol .  She states this helped breathing somewhat but she still feels wheezy.  Denies chest pain.  She denies fever or chills.  Denies sputum production.  Is somnolent and oriented to person but not place or time.  I called her husband Joanette Moynahan for some collateral information.  He states she was alert and oriented when she was at the house today when they called 911 for her shortness of breath.  She does not have baseline dementia or confusion.   Shortness of Breath      Home Medications Prior to Admission medications   Medication Sig Start Date End Date Taking? Authorizing Provider  acetaminophen  (TYLENOL ) 325 MG tablet Take 2 tablets (650 mg total) by mouth every 6 (six) hours as needed. 09/10/23   Teddi Favors, DO  albuterol  (PROVENTIL ) (2.5 MG/3ML) 0.083% nebulizer solution Take 2.5 mg by nebulization every Monday, Wednesday, and Friday.    [provider]  albuterol  (VENTOLIN  HFA) 108 (90 Base) MCG/ACT inhaler Inhale 2 puffs into the lungs every 6 (six) hours as needed for wheezing or shortness of breath.    [provider]  Aromatic Inhalants (VICKS VAPOINHALER IN) Inhale 1 puff into the lungs daily as needed (congestion).    [provider]  atorvastatin  (LIPITOR) 20 MG tablet Take 20 mg by mouth every evening.  01/08/19   [provider]   carboxymethylcellulose (REFRESH PLUS) 0.5 % SOLN Place 1 drop into both eyes 3 (three) times daily as needed (dry/irritated eyes.).    [provider]  clonazePAM  (KLONOPIN ) 1 MG tablet Take 1 tablet (1 mg total) by mouth 3 (three) times daily. Patient taking differently: Take 1 mg by mouth 4 (four) times daily as needed for anxiety. 04/26/19   Justina Oman, MD  dicyclomine  (BENTYL ) 10 MG capsule Take 10 mg by mouth 2 (two) times daily.    [provider]  DULoxetine (CYMBALTA) 20 MG capsule Take 20 mg by mouth daily. 02/04/24   [provider]  fluconazole  (DIFLUCAN ) 100 MG tablet Take 2 tablets (200mg ) the first day and then 1 tablet once a day for the next 21 days. Patient not taking: Reported on 08/15/2022 07/06/22   Suzette Espy, MD  gabapentin  (NEURONTIN ) 300 MG capsule TAKE (1) CAPSULE BY MOUTH THREE TIMES DAILY 02/06/23   Paulett Boros, MD  hydrOXYzine (ATARAX) 25 MG tablet Take 25 mg by mouth 2 (two) times daily. 09/10/22   [provider]  ibuprofen  (ADVIL ) 600 MG tablet Take 1 tablet (600 mg total) by mouth every 6 (six) hours as needed. 09/10/23   Teddi Favors, DO  levofloxacin (LEVAQUIN) 500 MG tablet Take 500 mg by mouth daily. 03/06/24   [provider]  lidocaine  (LIDODERM ) 5 % Place 1 patch onto the skin daily as needed. Remove & Discard patch within  12 hours or as directed by MD 09/10/23   Russella Courts A, DO  methylPREDNISolone  (MEDROL  DOSEPAK) 4 MG TBPK tablet Take 4 mg by mouth as directed. Dose pack 03/06/24   [provider]  Multiple Vitamin (MULTIVITAMIN WITH MINERALS) TABS tablet Take 1 tablet by mouth 3 (three) times a week.    [provider]  omeprazole (PRILOSEC) 40 MG capsule Take 40 mg by mouth daily.  10/04/16   [provider]  ondansetron  (ZOFRAN ) 4 MG tablet Take 4-8 mg by mouth every 6 (six) hours. 11/18/23   [provider]  oxyCODONE  (ROXICODONE ) 5 MG immediate release tablet  Take 1 tablet (5 mg total) by mouth every 6 (six) hours as needed for severe pain (pain score 7-10). 09/10/23   Teddi Favors, DO  oxyCODONE -acetaminophen  (PERCOCET) 10-325 MG tablet Take 1 tablet by mouth every 6 (six) hours. 02/04/24   [provider]  phenytoin  (DILANTIN ) 100 MG ER capsule Take 400 mg by mouth at bedtime.    [provider]  predniSONE  (STERAPRED UNI-PAK 21 TAB) 10 MG (21) TBPK tablet 10mg  Tabs, 6 day taper. Use as directed 03/31/23   Charmayne Cooper, MD  STIOLTO RESPIMAT 2.5-2.5 MCG/ACT AERS Inhale 2 puffs into the lungs daily. 06/25/22   [provider]  tamoxifen  (NOLVADEX ) 20 MG tablet TAKE ONE TABLET BY MOUTH ONCE DAILY. 11/15/22   Katragadda, Sreedhar, MD  tiZANidine (ZANAFLEX) 2 MG tablet Take 4 mg by mouth every 8 (eight) hours. 02/17/24   [provider]  traZODone  (DESYREL ) 150 MG tablet Take 225 mg by mouth at bedtime.    [provider]  zolpidem  (AMBIEN ) 5 MG tablet Take 5 mg by mouth at bedtime as needed. 11/30/23   [provider]      Allergies    Varenicline, Codeine, Haemophilus b polysaccharide vaccine, Influenza virus vaccine, and Lithium     Review of Systems   Review of Systems  Respiratory:  Positive for shortness of breath.     Physical Exam Updated Vital Signs BP 137/74   Pulse 93   Temp 98.6 F (37 C)   Resp 18   Ht 5\' 3"  (1.6 m)   Wt 57 kg   SpO2 91%   BMI 22.26 kg/m  Physical Exam Vitals and nursing note reviewed.  Constitutional:      General: She is not in acute distress.    Appearance: She is well-developed.     Comments: Patient is somnolent, will to be awakened and will answer questions with light touch.  HENT:     Head: Normocephalic and atraumatic.  Eyes:     Conjunctiva/sclera: Conjunctivae normal.  Cardiovascular:     Rate and Rhythm: Normal rate and regular rhythm.     Heart sounds: No murmur heard. Pulmonary:     Effort: Pulmonary effort is normal. No respiratory  distress.     Breath sounds: Examination of the right-upper field reveals wheezing. Examination of the left-upper field reveals wheezing. Examination of the right-middle field reveals wheezing. Examination of the left-middle field reveals wheezing. Examination of the right-lower field reveals wheezing. Examination of the left-lower field reveals wheezing. Wheezing present.  Abdominal:     Palpations: Abdomen is soft.     Tenderness: There is no abdominal tenderness.  Musculoskeletal:        General: No swelling. Normal range of motion.     Cervical back: Neck supple.     Right lower leg: No tenderness. No edema.  Left lower leg: No tenderness. No edema.  Skin:    General: Skin is warm and dry.     Capillary Refill: Capillary refill takes less than 2 seconds.  Neurological:     General: No focal deficit present.     Motor: No weakness.     Comments: Patient is oriented to person, can tell her that she is in the hospital and having trouble breathing but not able to tell me what town we are in, or what year it is.  Psychiatric:        Mood and Affect: Mood normal.     ED Results / Procedures / Treatments   Labs (all labs ordered are listed, but only abnormal results are displayed) Labs Reviewed - No data to display  EKG None  Radiology No results found.  Procedures Procedures  {Document cardiac monitor, telemetry assessment procedure when appropriate:1}  Medications Ordered in ED Medications - No data to display  ED Course/ Medical Decision Making/ A&P   {   Click here for ABCD2, HEART and other calculatorsREFRESH Note before signing :1}                              Medical Decision Making This patient presents to the ED for concern of ***, this involves an extensive number of treatment options, and is a complaint that carries with it a high risk of complications and morbidity.  The differential diagnosis includes ***   Co morbidities that complicate the patient  evaluation :   ***   Additional history obtained:  Additional history obtained from *** External records from outside source obtained and reviewed including ***   Lab Tests:  I Ordered, and personally interpreted labs.  The pertinent results include:  ***    Imaging Studies ordered:  I ordered imaging studies including *** which shows *** I independently visualized and interpreted imaging within scope of identifying emergent findings  I agree with the radiologist interpretation   Cardiac Monitoring: / EKG:  The patient was maintained on a cardiac monitor.  I personally viewed and interpreted the cardiac monitored which showed an underlying rhythm of: ***   Consultations Obtained:  I requested consultation with the ***,  and discussed lab and imaging findings as well as pertinent plan - they recommend: ***   Problem List / ED Course / Critical interventions / Medication management  ***  I ordered medication including ***  for ***  Reevaluation of the patient after these medicines showed that the patient {resolved/improved/worsened:23923::"improved"} I have reviewed the patients home medicines and have made adjustments as needed   Social Determinants of Health:  ***   Test / Admission - Considered:  ***    Amount and/or Complexity of Data Reviewed Labs: ordered. Radiology: ordered.  Risk Prescription drug management.   ***  {Document critical care time when appropriate:1} {Document review of labs and clinical decision tools ie heart score, Chads2Vasc2 etc:1}  {Document your independent review of radiology images, and any outside records:1} {Document your discussion with family members, caretakers, and with consultants:1} {Document social determinants of health affecting pt's care:1} {Document your decision making why or why not admission, treatments were needed:1} Final Clinical Impression(s) / ED Diagnoses Final diagnoses:  None    Rx / DC  Orders ED Discharge Orders     None

## 2024-03-09 NOTE — ED Provider Notes (Incomplete)
 [place] [facility] DEPARTMENT AT [facility] Provider Note   CSN: [id number] Arrival date & time: 03/09/24  1737     History {Add pertinent medical, surgical, social history, OB history to HPI:1} Chief Complaint  Patient presents with  . Shortness of Breath    [name] is a 73 y.o. female.  She has history of seizures, bipolar disorder, CVA, GERD, COPD.  Presents the ER today for evaluation of shortness of breath and wheezing that started this afternoon at home.  She called EMS to transport her to the ED, was saturating 90% on room air.  She had 1 albuterol  treatment and around 805 mg of Solu-Medrol .  She states this helped breathing somewhat but she still feels wheezy.  Denies chest pain.  She denies fever or chills.  Denies sputum production.  Is somnolent and oriented to person but not place or time.  I called her husband Joanette Moynahan for some collateral information.  He states she was alert and oriented when she was at the house today when they called 911 for her shortness of breath.  She does not have baseline dementia or confusion.   Shortness of Breath      Home Medications Prior to Admission medications   Medication Sig Start Date End Date Taking? Authorizing Provider  acetaminophen  (TYLENOL ) 325 MG tablet Take 2 tablets (650 mg total) by mouth every 6 (six) hours as needed. 09/10/23   Teddi Favors, DO  albuterol  (PROVENTIL ) (2.5 MG/3ML) 0.083% nebulizer solution Take 2.5 mg by nebulization every Monday, Wednesday, and Friday.    [provider]  albuterol  (VENTOLIN  HFA) 108 (90 Base) MCG/ACT inhaler Inhale 2 puffs into the lungs every 6 (six) hours as needed for wheezing or shortness of breath.    [provider]  Aromatic Inhalants (VICKS VAPOINHALER IN) Inhale 1 puff into the lungs daily as needed (congestion).    [provider]  atorvastatin  (LIPITOR) 20 MG tablet Take 20 mg by mouth every evening.  01/08/19   [provider]   carboxymethylcellulose (REFRESH PLUS) 0.5 % SOLN Place 1 drop into both eyes 3 (three) times daily as needed (dry/irritated eyes.).    [provider]  clonazePAM  (KLONOPIN ) 1 MG tablet Take 1 tablet (1 mg total) by mouth 3 (three) times daily. Patient taking differently: Take 1 mg by mouth 4 (four) times daily as needed for anxiety. 04/26/19   Justina Oman, MD  dicyclomine  (BENTYL ) 10 MG capsule Take 10 mg by mouth 2 (two) times daily.    [provider]  DULoxetine (CYMBALTA) 20 MG capsule Take 20 mg by mouth daily. 02/04/24   [provider]  fluconazole  (DIFLUCAN ) 100 MG tablet Take 2 tablets (200mg ) the first day and then 1 tablet once a day for the next 21 days. Patient not taking: Reported on 08/15/2022 07/06/22   Suzette Espy, MD  gabapentin  (NEURONTIN ) 300 MG capsule TAKE (1) CAPSULE BY MOUTH THREE TIMES DAILY 02/06/23   Paulett Boros, MD  hydrOXYzine (ATARAX) 25 MG tablet Take 25 mg by mouth 2 (two) times daily. 09/10/22   [provider]  ibuprofen  (ADVIL ) 600 MG tablet Take 1 tablet (600 mg total) by mouth every 6 (six) hours as needed. 09/10/23   Teddi Favors, DO  levofloxacin (LEVAQUIN) 500 MG tablet Take 500 mg by mouth daily. 03/06/24   [provider]  lidocaine  (LIDODERM ) 5 % Place 1 patch onto the skin daily as needed. Remove & Discard patch within  12 hours or as directed by MD 09/10/23   Russella Courts A, DO  methylPREDNISolone  (MEDROL  DOSEPAK) 4 MG TBPK tablet Take 4 mg by mouth as directed. Dose pack 03/06/24   [provider]  Multiple Vitamin (MULTIVITAMIN WITH MINERALS) TABS tablet Take 1 tablet by mouth 3 (three) times a week.    [provider]  omeprazole (PRILOSEC) 40 MG capsule Take 40 mg by mouth daily.  10/04/16   [provider]  ondansetron  (ZOFRAN ) 4 MG tablet Take 4-8 mg by mouth every 6 (six) hours. 11/18/23   [provider]  oxyCODONE  (ROXICODONE ) 5 MG immediate release tablet  Take 1 tablet (5 mg total) by mouth every 6 (six) hours as needed for severe pain (pain score 7-10). 09/10/23   Teddi Favors, DO  oxyCODONE -acetaminophen  (PERCOCET) 10-325 MG tablet Take 1 tablet by mouth every 6 (six) hours. 02/04/24   [provider]  phenytoin  (DILANTIN ) 100 MG ER capsule Take 400 mg by mouth at bedtime.    [provider]  predniSONE  (STERAPRED UNI-PAK 21 TAB) 10 MG (21) TBPK tablet 10mg  Tabs, 6 day taper. Use as directed 03/31/23   Charmayne Cooper, MD  STIOLTO RESPIMAT 2.5-2.5 MCG/ACT AERS Inhale 2 puffs into the lungs daily. 06/25/22   [provider]  tamoxifen  (NOLVADEX ) 20 MG tablet TAKE ONE TABLET BY MOUTH ONCE DAILY. 11/15/22   Katragadda, Sreedhar, MD  tiZANidine (ZANAFLEX) 2 MG tablet Take 4 mg by mouth every 8 (eight) hours. 02/17/24   [provider]  traZODone  (DESYREL ) 150 MG tablet Take 225 mg by mouth at bedtime.    [provider]  zolpidem  (AMBIEN ) 5 MG tablet Take 5 mg by mouth at bedtime as needed. 11/30/23   [provider]      Allergies    Varenicline, Codeine, Haemophilus b polysaccharide vaccine, Influenza virus vaccine, and Lithium     Review of Systems   Review of Systems  Respiratory:  Positive for shortness of breath.     Physical Exam Updated Vital Signs BP 137/74   Pulse 93   Temp 98.6 F (37 C)   Resp 18   Ht 5\' 3"  (1.6 m)   Wt 57 kg   SpO2 91%   BMI 22.26 kg/m  Physical Exam Vitals and nursing note reviewed.  Constitutional:      General: She is not in acute distress.    Appearance: She is well-developed.     Comments: Patient is somnolent, will to be awakened and will answer questions with light touch.  HENT:     Head: Normocephalic and atraumatic.  Eyes:     Conjunctiva/sclera: Conjunctivae normal.  Cardiovascular:     Rate and Rhythm: Normal rate and regular rhythm.     Heart sounds: No murmur heard. Pulmonary:     Effort: Pulmonary effort is normal. No respiratory  distress.     Breath sounds: Examination of the right-upper field reveals wheezing. Examination of the left-upper field reveals wheezing. Examination of the right-middle field reveals wheezing. Examination of the left-middle field reveals wheezing. Examination of the right-lower field reveals wheezing. Examination of the left-lower field reveals wheezing. Wheezing present.  Abdominal:     Palpations: Abdomen is soft.     Tenderness: There is no abdominal tenderness.  Musculoskeletal:        General: No swelling. Normal range of motion.     Cervical back: Neck supple.     Right lower leg: No tenderness. No edema.  Left lower leg: No tenderness. No edema.  Skin:    General: Skin is warm and dry.     Capillary Refill: Capillary refill takes less than 2 seconds.  Neurological:     General: No focal deficit present.     Motor: No weakness.     Comments: Patient is oriented to person, can tell her that she is in the hospital and having trouble breathing but not able to tell me what town we are in, or what year it is.  Psychiatric:        Mood and Affect: Mood normal.     ED Results / Procedures / Treatments   Labs (all labs ordered are listed, but only abnormal results are displayed) Labs Reviewed - No data to display  EKG None  Radiology No results found.  Procedures Procedures  {Document cardiac monitor, telemetry assessment procedure when appropriate:1}  Medications Ordered in ED Medications - No data to display  ED Course/ Medical Decision Making/ A&P   {   Click here for ABCD2, HEART and other calculatorsREFRESH Note before signing :1}                              Medical Decision Making This patient presents to the ED for concern of wheezing and shortness of breath, this involves an extensive number of treatment options, and is a complaint that carries with it a high risk of complications and morbidity.  The differential diagnosis includes exacerbation, heart failure  exacerbation, ACS, pneumonia, PE, other   Co morbidities that complicate the patient evaluation :   COPD, tobacco abuse, chronic benzodiazepine use   Additional history obtained:  Additional history obtained from EMR External records from outside source obtained and reviewed including prior notes and labs   Lab Tests:  I Ordered, and personally interpreted labs.  The pertinent results include: CBC-normal, BMP-normal, BMP-normal, troponin-normal, VBG shows hypoxia, no hypercarbia   Imaging Studies ordered:  I ordered imaging studies including CT head which shows no intracranial hemorrhage or other acute findings; chest x-ray shows no pulmonary edema or infiltrates I independently visualized and interpreted imaging within scope of identifying emergent findings  I agree with the radiologist interpretation   Cardiac Monitoring: / EKG:  The patient was maintained on a cardiac monitor.  I personally viewed and interpreted the cardiac monitored which showed an underlying rhythm of: Sinus rhythm   Consultations Obtained:  I requested consultation with the hospitalist Dr. Amy Kansky,  and discussed lab and imaging findings as well as pertinent plan - they recommend:admission for COPD exacerbation with toxic respiratory failure.   Problem List / ED Course / Critical interventions / Medication management  Shortness of breath-patient has COPD, was having wheezing, was also requiring supplemental O2 which is not her baseline.  She got steroids and albuterol  and ran out, on my exam she was still wheezing so that 2 more albuterol  treatments and breathing feels improved but she still drops to 85 to 86% when she takes her nasal cannula out.  Patient seemed very somnolent on my initial exam and was somewhat confused.  I spoke with her husband who states is not at baseline, is considering possible hypercarbia or if patient had seizure notices a postictal state or possible intoxicants.  Workup was  reassuring, UDS positive for benzodiazepines, CT head normal, nurse found patient's Klonopin  bottle in her pants, patient states she took it 3 times today which is how it  is prescribed.  Unsure if she potentially took an additional dose causing her somnolence but she is now back to her mental baseline.  She is agreeable with admission.  I have reviewed the patients home medicines and have made adjustments as needed   Social Determinants of Health:  Patient is a smoker, lives with her husband       Amount and/or Complexity of Data Reviewed Labs: ordered. Radiology: ordered.  Risk Prescription drug management.   ***  {Document critical care time when appropriate:1} {Document review of labs and clinical decision tools ie heart score, Chads2Vasc2 etc:1}  {Document your independent review of radiology images, and any outside records:1} {Document your discussion with family members, caretakers, and with consultants:1} {Document social determinants of health affecting pt's care:1} {Document your decision making why or why not admission, treatments were needed:1} Final Clinical Impression(s) / ED Diagnoses Final diagnoses:  None    Rx / DC Orders ED Discharge Orders     None

## 2024-03-10 DIAGNOSIS — J441 Chronic obstructive pulmonary disease with (acute) exacerbation: Secondary | ICD-10-CM

## 2024-03-10 DIAGNOSIS — J9601 Acute respiratory failure with hypoxia: Secondary | ICD-10-CM | POA: Diagnosis not present

## 2024-03-10 LAB — RESPIRATORY PANEL BY PCR

## 2024-03-10 LAB — BASIC METABOLIC PANEL WITH GFR
Anion gap: 9 (ref 5–15)
BUN: 10 mg/dL (ref 8–23)
CO2: 27 mmol/L (ref 22–32)
Calcium: 8.7 mg/dL — ABNORMAL LOW (ref 8.9–10.3)
Chloride: 101 mmol/L (ref 98–111)
Creatinine, Ser: 0.77 mg/dL (ref 0.44–1.00)
GFR, Estimated: >60 mL/min (ref >60)
Glucose, Bld: 100 mg/dL — ABNORMAL HIGH (ref 70–99)
Potassium: 4.1 mmol/L (ref 3.5–5.1)
Sodium: 137 mmol/L (ref 135–145)

## 2024-03-10 LAB — CBC
HCT: 36.4 % (ref 36.0–46.0)
Hemoglobin: 12.5 g/dL (ref 12.0–15.0)
MCH: 33.8 pg (ref 26.0–34.0)
MCHC: 34.3 g/dL (ref 30.0–36.0)
MCV: 98.4 fL (ref 80.0–100.0)
Platelets: 294 10*3/uL (ref 150–400)
RBC: 3.7 MIL/uL — ABNORMAL LOW (ref 3.87–5.11)
RDW: 12.8 % (ref 11.5–15.5)
WBC: 6.7 10*3/uL (ref 4.0–10.5)
nRBC: 0 % (ref 0.0–0.2)

## 2024-03-10 LAB — MAGNESIUM: Magnesium: 2.1 mg/dL (ref 1.7–2.4)

## 2024-03-10 LAB — PHOSPHORUS: Phosphorus: 4.2 mg/dL (ref 2.5–4.6)

## 2024-03-10 LAB — SARS CORONAVIRUS 2 BY RT PCR: SARS Coronavirus 2 by RT PCR: NEGATIVE

## 2024-03-10 MED ORDER — ACETAMINOPHEN 500 MG PO TABS
1000.0000 mg | ORAL_TABLET | Freq: Four times a day (QID) | ORAL | Status: DC | PRN
  Administered 2024-03-10: 1000 mg via ORAL
  Filled 2024-03-10: qty 2

## 2024-03-10 NOTE — Plan of Care (Signed)

## 2024-03-10 NOTE — Progress Notes (Signed)
 Patient insist on leaving this am. Patient was informed that she is responsible for anything that happens to her after she leaves, she states understanding and signed AMA paperwork.  MD made aware

## 2024-03-10 NOTE — TOC CM/SW Note (Signed)
 Transition of Care [facility]) - Inpatient Brief Assessment   Patient Details  Name: [name] MRN: [id number] Date of Birth: Jun 24, 1951  Transition of Care [facility] CM/SW Contact:    Cyndie Dredge, LCSWA Phone Number: 03/10/2024, 7:56 AM   Clinical Narrative:  Transition of Care Department [facility]) has reviewed patient and no TOC needs have been identified at this time. We will continue to monitor patient advancement through interdisciplinary progression rounds. If new patient transition needs arise, please place a TOC consult.   Transition of Care Asessment: Insurance and Status: Insurance coverage has been reviewed Patient has primary care physician: Yes Home environment has been reviewed: Single Family Home Prior level of function:: Indepedent Prior/Current Home Services: No current home services Social Drivers of Health Review: SDOH reviewed no interventions necessary Readmission risk has been reviewed: Yes Transition of care needs: no transition of care needs at this time

## 2024-03-10 NOTE — H&P (Signed)
 History and Physical    [name]:[id number] DOB: 10-06-1951 DOA: 03/09/2024  PCP: Kathyleen Parkins, MD   Patient coming from: Home   Chief Complaint:  Chief Complaint  Patient presents with   Shortness of Breath    HPI:  [name] is a 73 y.o. female with hx of COPD, CVA, remote hx Breast CA stage Ia, seizure disorder, bipolar disorder, hyperlipidemia, who presented due to acute onset of dyspnea.  On my interview she is slightly confused about the events that led her here to the hospital.  However she does recall that about a week ago she started developing a cough which is productive with yellow sputum.  She was started on levofloxacin 500 mg daily and Medrol  Dosepak.  However symptoms had persisted despite this.  She has no other complaints, currently feels breathing is improved with treatment in the ED.  See additional ED course below re: mental status   Review of Systems:  ROS complete and negative except as marked above   Allergies  Allergen Reactions   Varenicline Hives   Codeine Nausea And Vomiting   Haemophilus B Polysaccharide Vaccine Nausea And Vomiting   Influenza Virus Vaccine Nausea And Vomiting   Lithium  Nausea And Vomiting    Prior to Admission medications   Medication Sig Start Date End Date Taking? Authorizing Provider  acetaminophen  (TYLENOL ) 500 MG tablet Take 500 mg by mouth every 6 (six) hours as needed for mild pain (pain score 1-3).   Yes [provider]  albuterol  (PROVENTIL ) (2.5 MG/3ML) 0.083% nebulizer solution Take 2.5 mg by nebulization every Monday, Wednesday, and Friday.   Yes [provider]  albuterol  (VENTOLIN  HFA) 108 (90 Base) MCG/ACT inhaler Inhale 2 puffs into the lungs every 6 (six) hours as needed for wheezing or shortness of breath.   Yes [provider]  atorvastatin  (LIPITOR) 20 MG tablet Take 20 mg by mouth every evening.  01/08/19  Yes [provider]  budeson-glycopyrrolate -formoterol  (BREZTRI AEROSPHERE) 160-9-4.8 MCG/ACT AERO inhaler Inhale 2 puffs into the lungs 2 (two) times daily.   Yes [provider]  carboxymethylcellulose (REFRESH PLUS) 0.5 % SOLN Place 1 drop into both eyes 3 (three) times daily as needed (dry/irritated eyes.).   Yes [provider]  clonazePAM  (KLONOPIN ) 1 MG tablet Take 1 tablet (1 mg total) by mouth 3 (three) times daily. Patient taking differently: Take 1 mg by mouth 3 (three) times daily as needed for anxiety. 04/26/19  Yes Justina Oman, MD  dicyclomine  (BENTYL ) 10 MG capsule Take 10 mg by mouth 2 (two) times daily.   Yes [provider]  DULoxetine (CYMBALTA) 20 MG capsule Take 20 mg by mouth daily. 02/04/24  Yes [provider]  gabapentin  (NEURONTIN ) 300 MG capsule TAKE (1) CAPSULE BY MOUTH THREE TIMES DAILY 02/06/23  Yes Katragadda, Sreedhar, MD  hydrOXYzine (ATARAX) 25 MG tablet Take 25 mg by mouth 2 (two) times daily. 09/10/22  Yes [provider]  levofloxacin (LEVAQUIN) 500 MG tablet Take 500 mg by mouth daily. 03/06/24  Yes [provider]  lidocaine  (LIDODERM ) 5 % Place 1 patch onto the skin daily as needed. Remove & Discard patch within 12 hours or as directed by MD 09/10/23  Yes Russella Courts A, DO  methylPREDNISolone  (MEDROL  DOSEPAK) 4 MG TBPK tablet Take 4 mg by mouth as directed. Dose pack 03/06/24  Yes [provider]  Multiple Vitamin (MULTIVITAMIN WITH MINERALS) TABS tablet Take 1 tablet by mouth 3 (three) times  a week.   Yes [provider]  omeprazole (PRILOSEC) 40 MG capsule Take 40 mg by mouth daily.  10/04/16  Yes [provider]  ondansetron  (ZOFRAN ) 4 MG tablet Take 4-8 mg by mouth every 6 (six) hours. 11/18/23  Yes [provider]  phenytoin  (DILANTIN ) 100 MG ER capsule Take 400 mg by mouth at bedtime.   Yes [provider]  tamoxifen  (NOLVADEX ) 20 MG tablet TAKE ONE TABLET BY MOUTH ONCE DAILY. 11/15/22  Yes Katragadda, Sreedhar, MD   tiZANidine (ZANAFLEX) 2 MG tablet Take 4 mg by mouth every 8 (eight) hours. 02/17/24  Yes [provider]  zolpidem  (AMBIEN ) 5 MG tablet Take 5 mg by mouth at bedtime as needed for sleep. 11/30/23  Yes [provider]  Aromatic Inhalants (VICKS VAPOINHALER IN) Inhale 1 puff into the lungs daily as needed (congestion).    [provider]    Past Medical History:  Diagnosis Date   Alcohol abuse, in remission    Anxiety    Arthritis    Bipolar disorder (HCC)    Colitis    COPD (chronic obstructive pulmonary disease) (HCC)    occasional home O2   Gastric ulcer    GERD (gastroesophageal reflux disease)    Hemorrhoids    IBS (irritable bowel syndrome)    Seizures (HCC)    last seizure was about a year ago   Stroke [facility])    right sided weakness-slight    Past Surgical History:  Procedure Laterality Date   ABDOMINAL HYSTERECTOMY     BIOPSY  09/13/2022   Procedure: BIOPSY;  Surgeon: Vinetta Greening, DO;  [facility];  Service: Endoscopy;;   CHOLECYSTECTOMY     COLONOSCOPY WITH PROPOFOL  N/A 09/13/2022   Procedure: COLONOSCOPY WITH PROPOFOL ;  Surgeon: Vinetta Greening, DO;  Location: [facility];  Service: Endoscopy;  Laterality: N/A;  11:15 am   COMPRESSION HIP SCREW Right 04/25/2019   Procedure: COMPRESSION HIP;  Surgeon: Darrin Emerald, MD;  Location: [facility];  Service: Orthopedics;  Laterality: Right;  1030am   ESOPHAGEAL BRUSHING  07/04/2022   Procedure: ESOPHAGEAL BRUSHING;  Surgeon: Suzette Espy, MD;  [facility];  Service: Endoscopy;;   ESOPHAGOGASTRODUODENOSCOPY (EGD) WITH PROPOFOL  N/A 07/04/2022   Procedure: ESOPHAGOGASTRODUODENOSCOPY (EGD) WITH PROPOFOL ;  Surgeon: Suzette Espy, MD;  [facility];  Service: Endoscopy;  Laterality: N/A;  ESOPHAGOGASTRODUODENOSCOPY WITH FOOD IMPACTION REMOVAL   ESOPHAGOGASTRODUODENOSCOPY (EGD) WITH PROPOFOL  N/A 09/13/2022   Procedure: ESOPHAGOGASTRODUODENOSCOPY (EGD) WITH  PROPOFOL ;  Surgeon: Vinetta Greening, DO;  [facility];  Service: Endoscopy;  Laterality: N/A;   EYE SURGERY Bilateral    cataract removal   MASTECTOMY, PARTIAL Right 07/20/2020   Procedure: MASTECTOMY PARTIAL;  Surgeon: Alanda Allegra, MD;  Location: [facility];  Service: General;  Laterality: Right;   PARTIAL MASTECTOMY WITH AXILLARY SENTINEL LYMPH NODE BIOPSY Right 07/06/2020   Procedure: RIGHT PARTIAL MASTECTOMY WITH AXILLARY SENTINEL LYMPH NODE BIOPSY;  Surgeon: Alanda Allegra, MD;  Location: [facility];  Service: General;  Laterality: Right;   POLYPECTOMY  09/13/2022   Procedure: POLYPECTOMY;  Surgeon: Vinetta Greening, DO;  [facility];  Service: Endoscopy;;     reports that she has been smoking cigarettes. She has a 16 pack-year smoking history. She has never used smokeless tobacco. She reports current drug use. Drug: Marijuana. She reports that she does not drink alcohol.  Family History  Problem Relation Age of Onset  Dementia Father      Physical Exam: Vitals:   03/10/24 0219 03/10/24 0227 03/10/24 0301 03/10/24 0301  BP:    130/68  Pulse:  83  84  Resp:  (!) 22  20  Temp:    97.8 F (36.6 C)  TempSrc:    Oral  SpO2: 95% 100%  94%  Weight:   48.7 kg   Height:   5\' 3"  (1.6 m)     Gen: Awake, alert, chronically ill-appearing HEENT: Rhinophyma CV: Regular, normal S1, S2, no murmurs  Resp: Normal WOB, on nasal cannula, poor air movement with diffuse expiratory wheezing Abd: Flat, normoactive, nontender MSK: Symmetric, no edema  Skin: No rashes or lesions to exposed skin  Neuro: Alert and interactive, appears slightly confused, she is oriented to person, independent hospital, 2025, somewhat to situation (poor recall of events leading her to hospital) Psych: euthymic, appropriate    Data review:   Labs reviewed, notable for:   VBG 7.38/50 Bicarb 28 UA negative Tox positive benzodiazepine (Rx   Micro:  Results for orders placed or performed  during the hospital encounter of 03/09/24  SARS Coronavirus 2 by RT PCR (hospital order, performed in [facility] hospital lab) *cepheid single result test* Anterior Nasal Swab     Status: None   Collection Time: 03/10/24 12:35 AM   Specimen: Anterior Nasal Swab  Result Value Ref Range Status   SARS Coronavirus 2 by RT PCR NEGATIVE NEGATIVE Final    Comment: (NOTE) SARS-CoV-2 target nucleic acids are NOT DETECTED.  The SARS-CoV-2 RNA is generally detectable in upper and lower respiratory specimens during the acute phase of infection. The lowest concentration of SARS-CoV-2 viral copies this assay can detect is 250 copies / mL. A negative result does not preclude SARS-CoV-2 infection and should not be used as the sole basis for treatment or other patient management decisions.  A negative result may occur with improper specimen collection / handling, submission of specimen other than nasopharyngeal swab, presence of viral mutation(s) within the areas targeted by this assay, and inadequate number of viral copies (<250 copies / mL). A negative result must be combined with clinical observations, patient history, and epidemiological information.  Fact Sheet for Patients:   RoadLapTop.co.za  Fact Sheet for Healthcare Providers: http://kim-miller.com/  This test is not yet approved or  cleared by the [place]  FDA and has been authorized for detection and/or diagnosis of SARS-CoV-2 by FDA under an Emergency Use Authorization (EUA).  This EUA will remain in effect (meaning this test can be used) for the duration of the COVID-19 declaration under Section 564(b)(1) of the Act, 21 U.S.C. section 360bbb-3(b)(1), unless the authorization is terminated or revoked sooner.  Performed at [facility], [address] [place], [place] [address]     Imaging reviewed:  CT Head Wo Contrast Result Date: 03/09/2024 CLINICAL DATA:  Altered mental status  EXAM: CT HEAD WITHOUT CONTRAST TECHNIQUE: Contiguous axial images were obtained from the base of the skull through the vertex without intravenous contrast. RADIATION DOSE REDUCTION: This exam was performed according to the departmental dose-optimization program which includes automated exposure control, adjustment of the mA and/or kV according to patient size and/or use of iterative reconstruction technique. COMPARISON:  01/12/2024 FINDINGS: Brain: No evidence of acute infarction, hemorrhage, hydrocephalus, extra-axial collection or mass lesion/mass effect. Mild atrophic changes and chronic white matter ischemic changes are noted. Vascular: No hyperdense vessel or unexpected calcification. Skull: Normal. Negative for fracture or focal lesion. Sinuses/Orbits: No acute  finding. Other: None. IMPRESSION: Chronic atrophic and ischemic changes without acute abnormality. Electronically Signed   By: Violeta Grey M.D.   On: 03/09/2024 21:39   DG Chest 2 View Result Date: 03/09/2024 CLINICAL DATA:  sob EXAM: CHEST - 2 VIEW COMPARISON:  March 31, 2023 FINDINGS: Flattening of the diaphragms with hyperexpanded lungs, consistent with emphysema. No focal airspace consolidation, pleural effusion, or pneumothorax. No cardiomegaly. Aortic atherosclerosis. No acute fracture or destructive lesion. Diffuse osteopenia. Multilevel degenerative disc disease of the spine. Surgical clips in the right axilla. IMPRESSION: Emphysema.  Otherwise, no acute cardiopulmonary abnormality. Electronically Signed   By: Rance Burrows M.D.   On: 03/09/2024 19:38    EKG: Personally reviewed sinus rhythm, small Q waves anterolaterally, no acute ischemic changes  ED Course:  Was treated with methylprednisolone , nebs.  Noted to have confusion in the ED, workup unrevealing.  Was found with her bottle of clonazepam  in her bed.  Suspect that she took additional tablet.  During my interview she was also asking for clonazepam  again despite having just  improved in terms of her mental status.   Assessment/Plan:  73 y.o. female with hx COPD, CVA, remote hx Breast CA stage Ia, seizure disorder, bipolar disorder, hyperlipidemia, who presented due to acute onset of dyspnea that have acute hypoxic respiratory failure in setting of COPD exacerbation.  Acute hypoxic respiratory failure COPD exacerbation History of COPD exacerbation which have been treated outpatient with levofloxacin 500 mg daily since 5/9 and a Medrol  Dosepak.  However despite this had persistent symptoms and noted to have AHRF in the ED.  Desatted to 89% on room air and was placed on 2 L but had additional desaturation to 85% on O2.  Required up to 5 L.  Since has improved and now on 1 L.  Persistently tachypneic in the mid 20s.  Chest x-ray with changes of emphysema but no acute infiltrate.  -S/p methylprednisolone , continue prednisone  40 mg daily. Likely needs an extended course considering her symptoms despite steroids outpatient.  - Continue levofloxacin 500 mg daily per outpatient course EOT 5/13 for 5 day course.  - Check RVP, COVID, sputum culture - Home Breztri, DuoNebs every 6 hours scheduled, albuterol  every 4 hours as needed, incentive spirometer, flutter valve, encourage out of bed to chair. -Home O2 evaluation prior to discharge -Denies smoking  Encephalopathy, suspected toxic in the setting of clonazepam  overuse Noted to be confused in the emergency department and found with her bottle of clonazepam  in her bed suspected clonazepam  overuse.  Requested additional clonazepam  during my interview despite having just improved in terms of her mental status.  -Cautiously will continue her previously prescribed dosage of clonazepam  to avoid withdrawal especially in the setting of her seizure disorder.  However she needs further monitoring and surveillance at home by family to avoid any additional medication overuse.   Chronic medical problems: History of CVA: Not on  antiplatelet, continue home atorvastatin  Hx remote stage Ia Breast CA: s/p lumpectomy, on Tamoxifen  (held while inpatient).  Seizure disorder: Continue home phenytoin   Bipolar disorder: Continue home duloxetine, clonazepam  1 mg 3 times daily HLD: Continue her home atorvastatin  Insomnia: Hold her home Ambien  Chronic pain: Continue home Gabapentin  300 mg TID  Medication management: Encourage outpatient review of her medications outpatient considering numerous CNS depressing medications, chronic although compensated hypercarbia at risk of decompensation.  Encephalopathy in patient with feature of medication misuse.  Body mass index is 19.02 kg/m.    DVT prophylaxis:  Lovenox  Code Status:  Full Code Diet:  Diet Orders (From admission, onward)     Start     Ordered   03/09/24 2355  Diet regular Room service appropriate? Yes; Fluid consistency: Thin  Diet effective now       Question Answer Comment  Room service appropriate? Yes   Fluid consistency: Thin      03/09/24 2357           Family Communication:  No   Consults:  None   Admission status:   Inpatient, Telemetry bed  Severity of Illness: The appropriate patient status for this patient is INPATIENT. Inpatient status is judged to be reasonable and necessary in order to provide the required intensity of service to ensure the patient's safety. The patient's presenting symptoms, physical exam findings, and initial radiographic and laboratory data in the context of their chronic comorbidities is felt to place them at high risk for further clinical deterioration. Furthermore, it is not anticipated that the patient will be medically stable for discharge from the hospital within 2 midnights of admission.   * I certify that at the point of admission it is my clinical judgment that the patient will require inpatient hospital care spanning beyond 2 midnights from the point of admission due to high intensity of service, high risk for further  deterioration and high frequency of surveillance required.*   Arnulfo Larch, MD [facility]  How to contact the [facility] Attending or Consulting provider 7A - 7P or covering provider during after hours 7P -7A, for this patient.  Check the care team in [facility] and look for a) attending/consulting [facility] provider listed and b) the [facility] team listed Log into [email or web address] and use [place]'s universal password to access. If you do not have the password, please contact the hospital operator. Locate the [facility] provider you are looking for under [facility] and page to a number that you can be directly reached. If you still have difficulty reaching the provider, please page the [facility] (Director on Call) for the Hospitalists listed on amion for assistance.  03/10/2024, 5:23 AM

## 2024-03-26 ENCOUNTER — Other Ambulatory Visit (HOSPITAL_COMMUNITY): Payer: Self-pay | Admitting: Internal Medicine

## 2024-03-26 DIAGNOSIS — Z1231 Encounter for screening mammogram for malignant neoplasm of breast: Secondary | ICD-10-CM

## 2024-03-28 DIAGNOSIS — I700 Atherosclerosis of aorta: Secondary | ICD-10-CM | POA: Diagnosis not present

## 2024-03-28 DIAGNOSIS — J449 Chronic obstructive pulmonary disease, unspecified: Secondary | ICD-10-CM | POA: Diagnosis not present

## 2024-04-01 ENCOUNTER — Inpatient Hospital Stay: Admission: RE | Admit: 2024-04-01 | Source: Ambulatory Visit

## 2024-04-02 DIAGNOSIS — I10 Essential (primary) hypertension: Secondary | ICD-10-CM | POA: Diagnosis not present

## 2024-04-02 DIAGNOSIS — M1991 Primary osteoarthritis, unspecified site: Secondary | ICD-10-CM | POA: Diagnosis not present

## 2024-04-02 DIAGNOSIS — Z6820 Body mass index (BMI) 20.0-20.9, adult: Secondary | ICD-10-CM | POA: Diagnosis not present

## 2024-04-02 DIAGNOSIS — J449 Chronic obstructive pulmonary disease, unspecified: Secondary | ICD-10-CM | POA: Diagnosis not present

## 2024-04-02 DIAGNOSIS — G894 Chronic pain syndrome: Secondary | ICD-10-CM | POA: Diagnosis not present

## 2024-06-12 ENCOUNTER — Encounter (HOSPITAL_COMMUNITY): Payer: Self-pay

## 2024-06-12 ENCOUNTER — Emergency Department (HOSPITAL_COMMUNITY)

## 2024-06-12 ENCOUNTER — Other Ambulatory Visit: Payer: Self-pay

## 2024-06-12 ENCOUNTER — Observation Stay (HOSPITAL_COMMUNITY)
Admission: EM | Admit: 2024-06-12 | Discharge: 2024-06-13 | Disposition: A | Discharge status: Home / Self Care | Attending: Family Medicine | Admitting: Family Medicine

## 2024-06-12 DIAGNOSIS — R109 Unspecified abdominal pain: Secondary | ICD-10-CM | POA: Diagnosis not present

## 2024-06-12 DIAGNOSIS — Z8673 Personal history of transient ischemic attack (TIA), and cerebral infarction without residual deficits: Secondary | ICD-10-CM | POA: Diagnosis not present

## 2024-06-12 DIAGNOSIS — J9601 Acute respiratory failure with hypoxia: Secondary | ICD-10-CM | POA: Diagnosis not present

## 2024-06-12 DIAGNOSIS — R0602 Shortness of breath: Secondary | ICD-10-CM | POA: Diagnosis present

## 2024-06-12 DIAGNOSIS — F17220 Nicotine dependence, chewing tobacco, uncomplicated: Secondary | ICD-10-CM | POA: Diagnosis not present

## 2024-06-12 DIAGNOSIS — F12920 Cannabis use, unspecified with intoxication, uncomplicated: Secondary | ICD-10-CM | POA: Diagnosis not present

## 2024-06-12 DIAGNOSIS — Z853 Personal history of malignant neoplasm of breast: Secondary | ICD-10-CM | POA: Diagnosis not present

## 2024-06-12 DIAGNOSIS — R569 Unspecified convulsions: Secondary | ICD-10-CM | POA: Diagnosis not present

## 2024-06-12 DIAGNOSIS — J9811 Atelectasis: Secondary | ICD-10-CM | POA: Diagnosis not present

## 2024-06-12 DIAGNOSIS — Z743 Need for continuous supervision: Secondary | ICD-10-CM | POA: Diagnosis not present

## 2024-06-12 DIAGNOSIS — J441 Chronic obstructive pulmonary disease with (acute) exacerbation: Principal | ICD-10-CM | POA: Insufficient documentation

## 2024-06-12 DIAGNOSIS — R1013 Epigastric pain: Secondary | ICD-10-CM | POA: Diagnosis not present

## 2024-06-12 DIAGNOSIS — K5730 Diverticulosis of large intestine without perforation or abscess without bleeding: Secondary | ICD-10-CM | POA: Insufficient documentation

## 2024-06-12 DIAGNOSIS — J449 Chronic obstructive pulmonary disease, unspecified: Secondary | ICD-10-CM | POA: Diagnosis not present

## 2024-06-12 DIAGNOSIS — I700 Atherosclerosis of aorta: Secondary | ICD-10-CM | POA: Insufficient documentation

## 2024-06-12 DIAGNOSIS — D3502 Benign neoplasm of left adrenal gland: Secondary | ICD-10-CM | POA: Diagnosis not present

## 2024-06-12 DIAGNOSIS — G40909 Epilepsy, unspecified, not intractable, without status epilepticus: Secondary | ICD-10-CM

## 2024-06-12 DIAGNOSIS — F17200 Nicotine dependence, unspecified, uncomplicated: Secondary | ICD-10-CM | POA: Diagnosis present

## 2024-06-12 DIAGNOSIS — F319 Bipolar disorder, unspecified: Secondary | ICD-10-CM

## 2024-06-12 DIAGNOSIS — E782 Mixed hyperlipidemia: Secondary | ICD-10-CM

## 2024-06-12 DIAGNOSIS — E785 Hyperlipidemia, unspecified: Secondary | ICD-10-CM

## 2024-06-12 DIAGNOSIS — R079 Chest pain, unspecified: Secondary | ICD-10-CM | POA: Diagnosis not present

## 2024-06-12 DIAGNOSIS — F1010 Alcohol abuse, uncomplicated: Secondary | ICD-10-CM | POA: Diagnosis not present

## 2024-06-12 LAB — COMPREHENSIVE METABOLIC PANEL WITH GFR
ALT: 13 U/L (ref 0–44)
AST: 13 U/L — ABNORMAL LOW (ref 15–41)
Albumin: 3.6 g/dL (ref 3.5–5.0)
Alkaline Phosphatase: 74 U/L (ref 38–126)
Anion gap: 11 (ref 5–15)
BUN: 8 mg/dL (ref 8–23)
CO2: 22 mmol/L (ref 22–32)
Calcium: 8.7 mg/dL — ABNORMAL LOW (ref 8.9–10.3)
Chloride: 103 mmol/L (ref 98–111)
Creatinine, Ser: 0.74 mg/dL (ref 0.44–1.00)
GFR, Estimated: >60 mL/min (ref >60)
Glucose, Bld: 100 mg/dL — ABNORMAL HIGH (ref 70–99)
Potassium: 3.8 mmol/L (ref 3.5–5.1)
Sodium: 136 mmol/L (ref 135–145)
Total Bilirubin: 0.6 mg/dL (ref 0.0–1.2)
Total Protein: 6.8 g/dL (ref 6.5–8.1)

## 2024-06-12 LAB — URINALYSIS, ROUTINE W REFLEX MICROSCOPIC
Bilirubin Urine: NEGATIVE
Glucose, UA: NEGATIVE mg/dL
Ketones, ur: NEGATIVE mg/dL
Leukocytes,Ua: NEGATIVE
Nitrite: NEGATIVE
Protein, ur: NEGATIVE mg/dL
Specific Gravity, Urine: 1.004 — ABNORMAL LOW (ref 1.005–1.030)
pH: 6 (ref 5.0–8.0)

## 2024-06-12 LAB — CBC
HCT: 39.1 % (ref 36.0–46.0)
Hemoglobin: 13.4 g/dL (ref 12.0–15.0)
MCH: 32.4 pg (ref 26.0–34.0)
MCHC: 34.3 g/dL (ref 30.0–36.0)
MCV: 94.7 fL (ref 80.0–100.0)
Platelets: 258 K/uL (ref 150–400)
RBC: 4.13 MIL/uL (ref 3.87–5.11)
RDW: 13.8 % (ref 11.5–15.5)
WBC: 6 K/uL (ref 4.0–10.5)
nRBC: 0 % (ref 0.0–0.2)

## 2024-06-12 LAB — LIPASE, BLOOD: Lipase: 38 U/L (ref 11–51)

## 2024-06-12 LAB — TROPONIN I (HIGH SENSITIVITY)
Troponin I (High Sensitivity): 4 ng/L (ref <18)
Troponin I (High Sensitivity): 3 ng/L (ref <18)

## 2024-06-12 MED ORDER — ZOLPIDEM TARTRATE 5 MG PO TABS: 5.0000 mg | ORAL_TABLET | Freq: Every evening | ORAL | Status: DC | PRN

## 2024-06-12 MED ORDER — FLUTICASONE FUROATE-VILANTEROL 100-25 MCG/ACT IN AEPB
1.0000 | INHALATION_SPRAY | Freq: Every day | RESPIRATORY_TRACT | Status: DC
  Administered 2024-06-13: 1 via RESPIRATORY_TRACT
  Filled 2024-06-12: qty 28

## 2024-06-12 MED ORDER — GABAPENTIN 300 MG PO CAPS
300.0000 mg | ORAL_CAPSULE | Freq: Three times a day (TID) | ORAL | Status: DC
  Administered 2024-06-13 (×2): 300 mg via ORAL
  Filled 2024-06-12 (×2): qty 1

## 2024-06-12 MED ORDER — IOHEXOL 300 MG/ML  SOLN
100.0000 mL | Freq: Once | INTRAMUSCULAR | Status: AC | PRN
  Administered 2024-06-12: 100 mL via INTRAVENOUS

## 2024-06-12 MED ORDER — PANTOPRAZOLE SODIUM 40 MG PO TBEC
40.0000 mg | DELAYED_RELEASE_TABLET | Freq: Every day | ORAL | Status: DC
  Administered 2024-06-13: 40 mg via ORAL
  Filled 2024-06-12: qty 1

## 2024-06-12 MED ORDER — PHENYTOIN SODIUM EXTENDED 100 MG PO CAPS
400.0000 mg | ORAL_CAPSULE | Freq: Every day | ORAL | Status: DC
  Administered 2024-06-13: 400 mg via ORAL
  Filled 2024-06-12: qty 4

## 2024-06-12 MED ORDER — TAMOXIFEN CITRATE 10 MG PO TABS
20.0000 mg | ORAL_TABLET | Freq: Every day | ORAL | Status: DC
  Administered 2024-06-13: 20 mg via ORAL
  Filled 2024-06-12 (×2): qty 2

## 2024-06-12 MED ORDER — ALUM & MAG HYDROXIDE-SIMETH 200-200-20 MG/5ML PO SUSP
30.0000 mL | Freq: Once | ORAL | Status: AC
  Administered 2024-06-12: 30 mL via ORAL
  Filled 2024-06-12: qty 30

## 2024-06-12 MED ORDER — UMECLIDINIUM-VILANTEROL 62.5-25 MCG/ACT IN AEPB: 1.0000 | INHALATION_SPRAY | Freq: Every day | RESPIRATORY_TRACT | Status: DC

## 2024-06-12 MED ORDER — IPRATROPIUM-ALBUTEROL 0.5-2.5 (3) MG/3ML IN SOLN
3.0000 mL | Freq: Once | RESPIRATORY_TRACT | Status: AC
  Administered 2024-06-12: 3 mL via RESPIRATORY_TRACT
  Filled 2024-06-12: qty 3

## 2024-06-12 MED ORDER — UMECLIDINIUM BROMIDE 62.5 MCG/ACT IN AEPB
1.0000 | INHALATION_SPRAY | Freq: Every day | RESPIRATORY_TRACT | Status: DC
  Administered 2024-06-13: 1 via RESPIRATORY_TRACT
  Filled 2024-06-12: qty 7

## 2024-06-12 MED ORDER — ONDANSETRON HCL 4 MG/2ML IJ SOLN
4.0000 mg | Freq: Once | INTRAMUSCULAR | Status: AC
  Administered 2024-06-12: 4 mg via INTRAVENOUS
  Filled 2024-06-12: qty 2

## 2024-06-12 MED ORDER — ENOXAPARIN SODIUM 40 MG/0.4ML IJ SOSY
40.0000 mg | PREFILLED_SYRINGE | INTRAMUSCULAR | Status: DC
  Administered 2024-06-13: 40 mg via SUBCUTANEOUS
  Filled 2024-06-12: qty 0.4

## 2024-06-12 MED ORDER — BUDESON-GLYCOPYRROL-FORMOTEROL 160-9-4.8 MCG/ACT IN AERO: 2.0000 | INHALATION_SPRAY | Freq: Two times a day (BID) | RESPIRATORY_TRACT | Status: DC

## 2024-06-12 MED ORDER — ALBUTEROL SULFATE (2.5 MG/3ML) 0.083% IN NEBU
2.5000 mg | INHALATION_SOLUTION | RESPIRATORY_TRACT | Status: DC | PRN
Start: 2024-06-12 — End: 2024-06-13

## 2024-06-12 MED ORDER — ATORVASTATIN CALCIUM 10 MG PO TABS
20.0000 mg | ORAL_TABLET | Freq: Every evening | ORAL | Status: DC
Start: 2024-06-13 — End: 2024-06-13

## 2024-06-12 MED ORDER — MORPHINE SULFATE (PF) 4 MG/ML IV SOLN
4.0000 mg | Freq: Once | INTRAVENOUS | Status: AC
  Administered 2024-06-12: 4 mg via INTRAVENOUS
  Filled 2024-06-12: qty 1

## 2024-06-12 MED ORDER — METHYLPREDNISOLONE SODIUM SUCC 125 MG IJ SOLR
125.0000 mg | Freq: Once | INTRAMUSCULAR | Status: AC
  Administered 2024-06-12: 125 mg via INTRAVENOUS
  Filled 2024-06-12: qty 2

## 2024-06-12 MED ORDER — IPRATROPIUM-ALBUTEROL 0.5-2.5 (3) MG/3ML IN SOLN
3.0000 mL | Freq: Four times a day (QID) | RESPIRATORY_TRACT | Status: DC
  Administered 2024-06-13 (×2): 3 mL via RESPIRATORY_TRACT
  Filled 2024-06-12 (×2): qty 3

## 2024-06-12 MED ORDER — LIDOCAINE VISCOUS HCL 2 % MT SOLN
15.0000 mL | Freq: Once | OROMUCOSAL | Status: AC
  Administered 2024-06-12: 15 mL via ORAL
  Filled 2024-06-12: qty 15

## 2024-06-12 MED ORDER — PREDNISONE 20 MG PO TABS: 40.0000 mg | ORAL_TABLET | Freq: Every day | ORAL | Status: DC

## 2024-06-12 MED ORDER — METHYLPREDNISOLONE SODIUM SUCC 40 MG IJ SOLR
40.0000 mg | Freq: Two times a day (BID) | INTRAMUSCULAR | Status: DC
  Administered 2024-06-13: 40 mg via INTRAVENOUS
  Filled 2024-06-12: qty 1

## 2024-06-12 NOTE — ED Notes (Signed)
 Pt ambulated with a walker on O2 monitor with this nurse observing pt status. Pts O2 held in the lower 90s with one drop to 89. Pt said she felt good but her legs started shaking and wanted to sit down. Pt was able to ambulate back into the room with no incident.

## 2024-06-12 NOTE — ED Notes (Signed)
 Pt placed on 2L NC

## 2024-06-12 NOTE — ED Triage Notes (Signed)
 Pt BIB [facility] from home due to sudden onset of epigastric pain. Pt denies N/V/D.

## 2024-06-12 NOTE — H&P (Signed)
 PCP:   Bertell Satterfield, MD   Chief Complaint:  Shortness of breath  HPI:  This is a 73 year old female past medical history significant for COPD, HLD, seizure disorder, bipolar disease, HLD, remote history of CVA, history of breast cancer.  Per patient she has been short of breath ongoing for few days.  She had a mild wheeze.  Today her shortness of breath was worse.  Patient is a poor historian.  She additionally relates an odd feeling.  Something she is having difficulty explaining/describing.  She states she was going to the restroom when she became dizzy, she fell to the bathroom is much further away.  She felt weird and disoriented.  She had to call her husband for assistance.  He brought her to the ER.  In the ER patient given Solu-Medrol  125 mg, nebulizers x 2 as treatment for COPD exacerbation.  Patient ambulated and was satting 88%.  She was placed on 2 L oxygen .  Admission requested for COPD exacerbation.  Review of Systems:  The patient denies anorexia, fever, weight loss,, vision loss, decreased hearing, hoarseness, chest pain, syncope, dyspnea on exertion, peripheral edema, balance deficits, hemoptysis, abdominal pain, melena, hematochezia, severe indigestion/heartburn, hematuria, incontinence, genital sores, muscle weakness, suspicious skin lesions, transient blindness, difficulty walking, depression, unusual weight change, abnormal bleeding, enlarged lymph nodes, angioedema, and breast masses.  Past Medical History: Past Medical History:  Diagnosis Date   Alcohol abuse, in remission    Anxiety    Arthritis    Bipolar disorder (HCC)    Colitis    COPD (chronic obstructive pulmonary disease) (HCC)    occasional home O2   Gastric ulcer    GERD (gastroesophageal reflux disease)    Hemorrhoids    IBS (irritable bowel syndrome)    Seizures (HCC)    last seizure was about a year ago   Stroke [facility])    right sided weakness-slight   Past Surgical History:  Procedure Laterality  Date   ABDOMINAL HYSTERECTOMY     BIOPSY  09/13/2022   Procedure: BIOPSY;  Surgeon: Cindie Carlin POUR, DO;  Location: [facility];  Service: Endoscopy;;   CHOLECYSTECTOMY     COLONOSCOPY WITH PROPOFOL  N/A 09/13/2022   Procedure: COLONOSCOPY WITH PROPOFOL ;  Surgeon: Cindie Carlin POUR, DO;  Location: [facility];  Service: Endoscopy;  Laterality: N/A;  11:15 am   COMPRESSION HIP SCREW Right 04/25/2019   Procedure: COMPRESSION HIP;  Surgeon: Margrette Taft BRAVO, MD;  Location: [facility];  Service: Orthopedics;  Laterality: Right;  1030am   ESOPHAGEAL BRUSHING  07/04/2022   Procedure: ESOPHAGEAL BRUSHING;  Surgeon: Shaaron Lamar HERO, MD;  Location: [facility];  Service: Endoscopy;;   ESOPHAGOGASTRODUODENOSCOPY (EGD) WITH PROPOFOL  N/A 07/04/2022   Procedure: ESOPHAGOGASTRODUODENOSCOPY (EGD) WITH PROPOFOL ;  Surgeon: Shaaron Lamar HERO, MD;  [facility]: [facility];  Service: Endoscopy;  Laterality: N/A;  ESOPHAGOGASTRODUODENOSCOPY WITH FOOD IMPACTION REMOVAL   ESOPHAGOGASTRODUODENOSCOPY (EGD) WITH PROPOFOL  N/A 09/13/2022   Procedure: ESOPHAGOGASTRODUODENOSCOPY (EGD) WITH PROPOFOL ;  Surgeon: Cindie Carlin POUR, DO;  [facility]: [facility];  Service: Endoscopy;  Laterality: N/A;   EYE SURGERY Bilateral    cataract removal   MASTECTOMY, PARTIAL Right 07/20/2020   Procedure: MASTECTOMY PARTIAL;  Surgeon: Mavis Anes, MD;  Location: [facility];  Service: General;  Laterality: Right;   PARTIAL MASTECTOMY WITH AXILLARY SENTINEL LYMPH NODE BIOPSY Right 07/06/2020   Procedure: RIGHT PARTIAL MASTECTOMY WITH AXILLARY SENTINEL LYMPH NODE BIOPSY;  Surgeon: Mavis Anes, MD;  Location: [facility];  Service:  General;  Laterality: Right;   POLYPECTOMY  09/13/2022   Procedure: POLYPECTOMY;  Surgeon: Cindie Carlin POUR, DO;  [facility]: [facility];  Service: Endoscopy;;    Medications: Prior to Admission medications   Medication Sig Start Date End Date Taking? Authorizing Provider  acetaminophen  (TYLENOL ) 500 MG tablet Take  500 mg by mouth every 6 (six) hours as needed for mild pain (pain score 1-3).    [provider]  albuterol  (PROVENTIL ) (2.5 MG/3ML) 0.083% nebulizer solution Take 2.5 mg by nebulization every Monday, Wednesday, and Friday.    [provider]  albuterol  (VENTOLIN  HFA) 108 (90 Base) MCG/ACT inhaler Inhale 2 puffs into the lungs every 6 (six) hours as needed for wheezing or shortness of breath.    [provider]  Aromatic Inhalants (VICKS VAPOINHALER IN) Inhale 1 puff into the lungs daily as needed (congestion).    [provider]  atorvastatin  (LIPITOR) 20 MG tablet Take 20 mg by mouth every evening.  01/08/19   [provider]  budeson-glycopyrrolate -formoterol  (BREZTRI  AEROSPHERE) 160-9-4.8 MCG/ACT AERO inhaler Inhale 2 puffs into the lungs 2 (two) times daily.    [provider]  carboxymethylcellulose (REFRESH PLUS) 0.5 % SOLN Place 1 drop into both eyes 3 (three) times daily as needed (dry/irritated eyes.).    [provider]  clonazePAM  (KLONOPIN ) 1 MG tablet Take 1 tablet (1 mg total) by mouth 3 (three) times daily. Patient taking differently: Take 1 mg by mouth 3 (three) times daily as needed for anxiety. 04/26/19   Ricky Fines, MD  dicyclomine  (BENTYL ) 10 MG capsule Take 10 mg by mouth 2 (two) times daily.    [provider]  DULoxetine  (CYMBALTA ) 20 MG capsule Take 20 mg by mouth daily. 02/04/24   [provider]  gabapentin  (NEURONTIN ) 300 MG capsule TAKE (1) CAPSULE BY MOUTH THREE TIMES DAILY 02/06/23   Rogers Hai, MD  hydrOXYzine (ATARAX) 25 MG tablet Take 25 mg by mouth 2 (two) times daily. 09/10/22   [provider]  levofloxacin  (LEVAQUIN ) 500 MG tablet Take 500 mg by mouth daily. 03/06/24   [provider]  lidocaine  (LIDODERM ) 5 % Place 1 patch onto the skin daily as needed. Remove & Discard patch within 12 hours or as directed by MD 09/10/23   Elnor Savant A, DO   methylPREDNISolone  (MEDROL  DOSEPAK) 4 MG TBPK tablet Take 4 mg by mouth as directed. Dose pack 03/06/24   [provider]  Multiple Vitamin (MULTIVITAMIN WITH MINERALS) TABS tablet Take 1 tablet by mouth 3 (three) times a week.    [provider]  omeprazole (PRILOSEC) 40 MG capsule Take 40 mg by mouth daily.  10/04/16   [provider]  ondansetron  (ZOFRAN ) 4 MG tablet Take 4-8 mg by mouth every 6 (six) hours. 11/18/23   [provider]  phenytoin  (DILANTIN ) 100 MG ER capsule Take 400 mg by mouth at bedtime.    [provider]  tamoxifen  (NOLVADEX ) 20 MG tablet TAKE ONE TABLET BY MOUTH ONCE DAILY. 11/15/22   Katragadda, Sreedhar, MD  tiZANidine (ZANAFLEX) 2 MG tablet Take 4 mg by mouth every 8 (eight) hours. 02/17/24   [provider]  zolpidem  (AMBIEN ) 5 MG tablet Take 5 mg by mouth at bedtime as needed for sleep. 11/30/23   [provider]    Allergies:   Allergies  Allergen Reactions   Varenicline Hives   Codeine Nausea And Vomiting   Haemophilus B Polysaccharide Vaccine Nausea And Vomiting   Influenza  Virus Vaccine Nausea And Vomiting   Lithium  Nausea And Vomiting    Social History:  reports that she has been smoking cigarettes. She has a 16 pack-year smoking history. She has never used smokeless tobacco. She reports current drug use. Drug: Marijuana. She reports that she does not drink alcohol.  Family History: Family History  Problem Relation Age of Onset   Dementia Father     Physical Exam: Vitals:   06/12/24 2245 06/12/24 2251 06/12/24 2300 06/12/24 2315  BP: (!) 162/75  (!) 154/71 (!) 142/71  Pulse: 93 90 84 85  Resp: 19 17 20 20   Temp:      SpO2: (!) 88% 91% 93% 93%  Weight:      Height:        General:  A&O x3, WDWN, no acute distress.  Weak appearing gentleman Eyes: PERRL, pink conjunctiva, no scleral icterus ENT: Moist oral mucosa, neck supple, no thyromegaly Lungs: CTAB/L, no wheeze, no crackles, no  use of accessory muscles Cardiovascular: RRR, no regurgitation, no murmurs. No carotid bruits, no JVD Abdomen: soft, positive BS, NTND, not an acute abdomen GU: not examined Neuro: CN II - XII grossly intact, sensation intact Musculoskeletal: strength 5/5 all extremities, no clubbing, edema Skin: no rash, no subcutaneous crepitation, no decubitus Psych: appropriate patient   Labs on Admission:  Recent Labs    06/12/24 2002  NA 136  K 3.8  CL 103  CO2 22  GLUCOSE 100*  BUN 8  CREATININE 0.74  CALCIUM  8.7*   Recent Labs    06/12/24 2002  AST 13*  ALT 13  ALKPHOS 74  BILITOT 0.6  PROT 6.8  ALBUMIN 3.6   Recent Labs    06/12/24 2002  LIPASE 38   Recent Labs    06/12/24 2002  WBC 6.0  HGB 13.4  HCT 39.1  MCV 94.7  PLT 258    Radiological Exams on Admission: CT ABDOMEN PELVIS W CONTRAST Result Date: 06/12/2024 CLINICAL DATA:  Epigastric pain EXAM: CT ABDOMEN AND PELVIS WITH CONTRAST TECHNIQUE: Multidetector CT imaging of the abdomen and pelvis was performed using the standard protocol following bolus administration of intravenous contrast. RADIATION DOSE REDUCTION: This exam was performed according to the departmental dose-optimization program which includes automated exposure control, adjustment of the mA and/or kV according to patient size and/or use of iterative reconstruction technique. CONTRAST:  OMNIPAQUE  IOHEXOL  300 MG/ML  SOLN COMPARISON:  01/12/2024 FINDINGS: Lower chest: No acute abnormality Hepatobiliary: Prior cholecystectomy. Intrahepatic and extrahepatic biliary ductal dilatation is stable since prior study compatible with post cholecystectomy state. Pancreas: No focal abnormality or ductal dilatation. Spleen: No focal abnormality.  Normal size. Adrenals/Urinary Tract: Left adrenal adenoma is unchanged. Right adrenal gland and kidneys unremarkable. No stones or hydronephrosis. Urinary bladder unremarkable. Stomach/Bowel: Stomach, large and small bowel  grossly unremarkable. Scattered sigmoid diverticula. No active diverticulitis. Vascular/Lymphatic: Aortic atherosclerosis. No evidence of aneurysm or adenopathy. Reproductive: Prior hysterectomy.  No adnexal masses. Other: No free fluid or free air. Musculoskeletal: No acute bony abnormality. IMPRESSION: No acute findings in the abdomen or pelvis. Sigmoid diverticulosis.  No active diverticulitis. Aortic atherosclerosis. Electronically Signed   By: Franky Crease M.D.   On: 06/12/2024 22:18   DG Chest Port 1 View Result Date: 06/12/2024 CLINICAL DATA:  Chest pain. EXAM: PORTABLE CHEST 1 VIEW COMPARISON:  Mar 09, 2024 FINDINGS: The heart size and mediastinal contours are within normal limits. The lungs are hyperinflated with evidence of emphysematous lung disease. Mild atelectasis is suspected  within the retrocardiac region of the left lung base. No pleural effusion or pneumothorax is identified. Multilevel degenerative changes seen throughout the thoracic spine. IMPRESSION: COPD with mild left basilar atelectasis. Electronically Signed   By: Suzen Dials M.D.   On: 06/12/2024 20:45    Assessment/Plan Present on Admission:  Acute respiratory failure with hypoxia (HCC)  Acute exacerbation of COPD - COPD exacerbation - IV Solu-Medrol , transition to p.o. - Scheduled and PRN  Nebulizers ordered - Oxygen  to keep sats greater 88%. - Respiratory consult placed - D-dimer added   Tobacco use disorder - Nicotine  patch ordered   Bipolar disorder - Klonopin , Cymbalta  resumed   HLD - Atorvastatin  reordered   Seizure disorder - Dilantin  resumed - Dilantin  levels ordered   History of CVA  Remote history of breast cancer - Tamoxifen  resumed  Masha Orbach 06/12/2024, 11:34 PM

## 2024-06-12 NOTE — ED Notes (Signed)
 Patient transported to CT

## 2024-06-12 NOTE — ED Provider Notes (Signed)
 [place] EMERGENCY DEPARTMENT AT [facility] Provider Note   CSN: [id number] Arrival date & time: 06/12/24  8079     Patient presents with: Abdominal Pain   [name] is a 73 y.o. female.   Patient is a 74 year old female who presents emergency department the chief complaint of substernal chest pain, shortness of breath, epigastric Donnell pain which began just prior to arrival.  Patient denies any associated nausea, vomiting, diarrhea.  She does note that she has a history of COPD.  She denies any associated fever, chills.  She has had no dizziness, lightheadedness or syncope.  She denies any recent falls or blunt chest wall trauma.   Abdominal Pain Associated symptoms: shortness of breath        Prior to Admission medications   Medication Sig Start Date End Date Taking? Authorizing Provider  acetaminophen  (TYLENOL ) 500 MG tablet Take 500 mg by mouth every 6 (six) hours as needed for mild pain (pain score 1-3).    [provider]  albuterol  (PROVENTIL ) (2.5 MG/3ML) 0.083% nebulizer solution Take 2.5 mg by nebulization every Monday, Wednesday, and Friday.    [provider]  albuterol  (VENTOLIN  HFA) 108 (90 Base) MCG/ACT inhaler Inhale 2 puffs into the lungs every 6 (six) hours as needed for wheezing or shortness of breath.    [provider]  Aromatic Inhalants (VICKS VAPOINHALER IN) Inhale 1 puff into the lungs daily as needed (congestion).    [provider]  atorvastatin  (LIPITOR) 20 MG tablet Take 20 mg by mouth every evening.  01/08/19   [provider]  budeson-glycopyrrolate -formoterol  (BREZTRI  AEROSPHERE) 160-9-4.8 MCG/ACT AERO inhaler Inhale 2 puffs into the lungs 2 (two) times daily.    [provider]  carboxymethylcellulose (REFRESH PLUS) 0.5 % SOLN Place 1 drop into both eyes 3 (three) times daily as needed (dry/irritated eyes.).    [provider]  clonazePAM  (KLONOPIN ) 1 MG tablet Take 1  tablet (1 mg total) by mouth 3 (three) times daily. Patient taking differently: Take 1 mg by mouth 3 (three) times daily as needed for anxiety. 04/26/19   Ricky Fines, MD  dicyclomine  (BENTYL ) 10 MG capsule Take 10 mg by mouth 2 (two) times daily.    [provider]  DULoxetine  (CYMBALTA ) 20 MG capsule Take 20 mg by mouth daily. 02/04/24   [provider]  gabapentin  (NEURONTIN ) 300 MG capsule TAKE (1) CAPSULE BY MOUTH THREE TIMES DAILY 02/06/23   Rogers Hai, MD  hydrOXYzine (ATARAX) 25 MG tablet Take 25 mg by mouth 2 (two) times daily. 09/10/22   [provider]  levofloxacin  (LEVAQUIN ) 500 MG tablet Take 500 mg by mouth daily. 03/06/24   [provider]  lidocaine  (LIDODERM ) 5 % Place 1 patch onto the skin daily as needed. Remove & Discard patch within 12 hours or as directed by MD 09/10/23   Elnor Savant A, DO  methylPREDNISolone  (MEDROL  DOSEPAK) 4 MG TBPK tablet Take 4 mg by mouth as directed. Dose pack 03/06/24   [provider]  Multiple Vitamin (MULTIVITAMIN WITH MINERALS) TABS tablet Take 1 tablet by mouth 3 (three) times a week.    [provider]  omeprazole (PRILOSEC) 40 MG capsule Take 40 mg by mouth daily.  10/04/16   [provider]  ondansetron  (ZOFRAN ) 4 MG tablet Take 4-8 mg by mouth every 6 (six) hours. 11/18/23   [provider]  phenytoin  (DILANTIN ) 100 MG ER capsule Take 400 mg by mouth at bedtime.  [provider]  tamoxifen  (NOLVADEX ) 20 MG tablet TAKE ONE TABLET BY MOUTH ONCE DAILY. 11/15/22   Katragadda, Sreedhar, MD  tiZANidine (ZANAFLEX) 2 MG tablet Take 4 mg by mouth every 8 (eight) hours. 02/17/24   [provider]  zolpidem  (AMBIEN ) 5 MG tablet Take 5 mg by mouth at bedtime as needed for sleep. 11/30/23   [provider]    Allergies: Varenicline, Codeine, Haemophilus b polysaccharide vaccine, Influenza virus vaccine, and Lithium     Review of Systems  Respiratory:   Positive for shortness of breath.   Gastrointestinal:  Positive for abdominal pain.  All other systems reviewed and are negative.   Updated Vital Signs BP (!) 135/57   Pulse 92   Temp 98.4 F (36.9 C)   Resp (!) 22   Ht 5' 4 (1.626 m)   Wt 50.3 kg   SpO2 91%   BMI 19.05 kg/m   Physical Exam Vitals and nursing note reviewed.  Constitutional:      General: She is not in acute distress.    Appearance: Normal appearance. She is not ill-appearing.  HENT:     Head: Normocephalic and atraumatic.     Nose: Nose normal.     Mouth/Throat:     Mouth: Mucous membranes are moist.  Eyes:     Extraocular Movements: Extraocular movements intact.     Conjunctiva/sclera: Conjunctivae normal.     Pupils: Pupils are equal, round, and reactive to light.  Cardiovascular:     Rate and Rhythm: Normal rate and regular rhythm.     Pulses: Normal pulses.     Heart sounds: Normal heart sounds. No murmur heard.    No gallop.  Pulmonary:     Effort: Pulmonary effort is normal. No respiratory distress.     Breath sounds: Normal breath sounds. No stridor. No rhonchi or rales.     Comments: Diffuse expiratory wheezing Abdominal:     General: Abdomen is flat. Bowel sounds are normal. There is no distension.     Palpations: Abdomen is soft.     Tenderness: There is abdominal tenderness in the epigastric area. Negative signs include Murphy's sign and McBurney's sign.     Hernia: No hernia is present.  Musculoskeletal:        General: Normal range of motion.     Cervical back: Normal range of motion and neck supple.  Skin:    General: Skin is warm and dry.  Neurological:     General: No focal deficit present.     Mental Status: She is alert and oriented to person, place, and time. Mental status is at baseline.  Psychiatric:        Mood and Affect: Mood normal.        Behavior: Behavior normal.        Thought Content: Thought content normal.        Judgment: Judgment normal.     (all labs  ordered are listed, but only abnormal results are displayed) Labs Reviewed  COMPREHENSIVE METABOLIC PANEL WITH GFR - Abnormal; Notable for the following components:      Result Value   Glucose, Bld 100 (*)    Calcium  8.7 (*)    AST 13 (*)    All other components within normal limits  URINALYSIS, ROUTINE W REFLEX MICROSCOPIC - Abnormal; Notable for the following components:   Color, Urine STRAW (*)    Specific Gravity, Urine 1.004 (*)    Hgb urine dipstick MODERATE (*)  Bacteria, UA RARE (*)    All other components within normal limits  LIPASE, BLOOD  CBC  TROPONIN I (HIGH SENSITIVITY)  TROPONIN I (HIGH SENSITIVITY)    EKG: EKG Interpretation Date/Time:  Friday June 12 2024 19:40:11 EDT Ventricular Rate:  67 PR Interval:  150 QRS Duration:  84 QT Interval:  382 QTC Calculation: 404 R Axis:   87  Text Interpretation: Sinus rhythm Borderline right axis deviation No significant change since last tracing Confirmed by Patsey Lot [phone]) on 06/12/2024 7:42:10 PM  Radiology: CT ABDOMEN PELVIS W CONTRAST Result Date: 06/12/2024 CLINICAL DATA:  Epigastric pain EXAM: CT ABDOMEN AND PELVIS WITH CONTRAST TECHNIQUE: Multidetector CT imaging of the abdomen and pelvis was performed using the standard protocol following bolus administration of intravenous contrast. RADIATION DOSE REDUCTION: This exam was performed according to the departmental dose-optimization program which includes automated exposure control, adjustment of the mA and/or kV according to patient size and/or use of iterative reconstruction technique. CONTRAST:  OMNIPAQUE  IOHEXOL  300 MG/ML  SOLN COMPARISON:  01/12/2024 FINDINGS: Lower chest: No acute abnormality Hepatobiliary: Prior cholecystectomy. Intrahepatic and extrahepatic biliary ductal dilatation is stable since prior study compatible with post cholecystectomy state. Pancreas: No focal abnormality or ductal dilatation. Spleen: No focal abnormality.  Normal  size. Adrenals/Urinary Tract: Left adrenal adenoma is unchanged. Right adrenal gland and kidneys unremarkable. No stones or hydronephrosis. Urinary bladder unremarkable. Stomach/Bowel: Stomach, large and small bowel grossly unremarkable. Scattered sigmoid diverticula. No active diverticulitis. Vascular/Lymphatic: Aortic atherosclerosis. No evidence of aneurysm or adenopathy. Reproductive: Prior hysterectomy.  No adnexal masses. Other: No free fluid or free air. Musculoskeletal: No acute bony abnormality. IMPRESSION: No acute findings in the abdomen or pelvis. Sigmoid diverticulosis.  No active diverticulitis. Aortic atherosclerosis. Electronically Signed   By: Franky Crease M.D.   On: 06/12/2024 22:18   DG Chest Port 1 View Result Date: 06/12/2024 CLINICAL DATA:  Chest pain. EXAM: PORTABLE CHEST 1 VIEW COMPARISON:  Mar 09, 2024 FINDINGS: The heart size and mediastinal contours are within normal limits. The lungs are hyperinflated with evidence of emphysematous lung disease. Mild atelectasis is suspected within the retrocardiac region of the left lung base. No pleural effusion or pneumothorax is identified. Multilevel degenerative changes seen throughout the thoracic spine. IMPRESSION: COPD with mild left basilar atelectasis. Electronically Signed   By: Suzen Dials M.D.   On: 06/12/2024 20:45     .Critical Care  Performed by: Daralene Lonni BIRCH, PA-C Authorized by: Daralene Lonni BIRCH, PA-C   Critical care provider statement:    Critical care time (minutes):  35   Critical care was necessary to treat or prevent imminent or life-threatening deterioration of the following conditions:  Respiratory failure   Critical care was time spent personally by me on the following activities:  Development of treatment plan with patient or surrogate, discussions with consultants, evaluation of patient's response to treatment, examination of patient, ordering and review of laboratory studies, ordering and  review of radiographic studies, ordering and performing treatments and interventions, pulse oximetry, re-evaluation of patient's condition and review of old charts   I assumed direction of critical care for this patient from another provider in my specialty: no     Care discussed with: admitting provider      Medications Ordered in the ED  morphine  (PF) 4 MG/ML injection 4 mg (4 mg Intravenous Given 06/12/24 2008)  ondansetron  (ZOFRAN ) injection 4 mg (4 mg Intravenous Given 06/12/24 2004)  alum & mag hydroxide-simeth (MAALOX/MYLANTA) 200-200-20 MG/5ML suspension 30  mL (30 mLs Oral Given 06/12/24 2013)    And  lidocaine  (XYLOCAINE ) 2 % viscous mouth solution 15 mL (15 mLs Oral Given 06/12/24 2013)  ipratropium-albuterol  (DUONEB) 0.5-2.5 (3) MG/3ML nebulizer solution 3 mL (3 mLs Nebulization Given 06/12/24 1953)  ipratropium-albuterol  (DUONEB) 0.5-2.5 (3) MG/3ML nebulizer solution 3 mL (3 mLs Nebulization Given 06/12/24 1953)  methylPREDNISolone  sodium succinate (SOLU-MEDROL ) 125 mg/2 mL injection 125 mg (125 mg Intravenous Given 06/12/24 2010)  iohexol  (OMNIPAQUE ) 300 MG/ML solution 100 mL (100 mLs Intravenous Contrast Given 06/12/24 2112)                                    Medical Decision Making Amount and/or Complexity of Data Reviewed Labs: ordered. Radiology: ordered.  Risk OTC drugs. Prescription drug management. Decision regarding hospitalization.   This patient presents to the ED for concern of shortness of breath, chest pain, epigastric pain differential diagnosis includes ACS, pulmonary embolus, pericarditis, myocarditis, endocarditis, sepsis, pneumonia, COPD exacerbation, cholecystitis, pancreatitis, mesenteric ischemia, pyelonephritis, kidney stone    Additional history obtained:  Additional history obtained from medical records External records from outside source obtained and reviewed including medical records   Lab Tests:  I Ordered, and personally interpreted labs.   The pertinent results include: No leukocytosis, no anemia, normal kidney function liver function, normal electrolytes, negative serial troponins, unremarkable urinalysis   Imaging Studies ordered:  I ordered imaging studies including CT scan of abdomen and pelvis, chest x-ray I independently visualized and interpreted imaging which showed no acute intra-abdominal surgical process, no acute cardiopulmonary process I agree with the radiologist interpretation   Medicines ordered and prescription drug management:  I ordered medication including morphine , Zofran , GI cocktail, DuoNeb, Solu-Medrol  for COPD exacerbation, epigastric pain Reevaluation of the patient after these medicines showed that the patient improved I have reviewed the patients home medicines and have made adjustments as needed   Problem List / ED Course:  Patient is doing well at this time and does remain stable.  Patient did have desaturations during ambulation as well as at rest to the upper 80s on room air.  Patient is currently on 2 L nasal cannula and is satting well.  Will plan for admission to the hospital service for COPD exacerbation.  CT scan of abdomen pelvis was unremarked for any signs of acute surgical process.  Chest x-ray was unremarkable for any occasion for pneumonia.  Do not specked ACS at this time.  EKG demonstrated no acute ischemic changes and she had negative serial troponins.  Did discuss patient case with Dr. Laveda with hospital service who has excepted for admission at this time.   Social Determinants of Health:  None        Final diagnoses:  None    ED Discharge Orders     None          Emmanuela Ghazi D, PA-C 06/12/24 2331    Patsey Lot, MD 06/13/24 [phone]

## 2024-06-13 DIAGNOSIS — G40909 Epilepsy, unspecified, not intractable, without status epilepticus: Secondary | ICD-10-CM | POA: Diagnosis not present

## 2024-06-13 DIAGNOSIS — J441 Chronic obstructive pulmonary disease with (acute) exacerbation: Secondary | ICD-10-CM | POA: Diagnosis not present

## 2024-06-13 DIAGNOSIS — F319 Bipolar disorder, unspecified: Secondary | ICD-10-CM | POA: Diagnosis not present

## 2024-06-13 DIAGNOSIS — E782 Mixed hyperlipidemia: Secondary | ICD-10-CM | POA: Diagnosis not present

## 2024-06-13 DIAGNOSIS — J9601 Acute respiratory failure with hypoxia: Secondary | ICD-10-CM | POA: Diagnosis not present

## 2024-06-13 LAB — CBC WITH DIFFERENTIAL/PLATELET
Abs Immature Granulocytes: 0.02 K/uL (ref 0.00–0.07)
Basophils Absolute: 0 K/uL (ref 0.0–0.1)
Basophils Relative: 1 %
Eosinophils Absolute: 0.2 K/uL (ref 0.0–0.5)
Eosinophils Relative: 3 %
HCT: 39.9 % (ref 36.0–46.0)
Hemoglobin: 13.5 g/dL (ref 12.0–15.0)
Immature Granulocytes: 0 %
Lymphocytes Relative: 22 %
Lymphs Abs: 1.3 K/uL (ref 0.7–4.0)
MCH: 32.2 pg (ref 26.0–34.0)
MCHC: 33.8 g/dL (ref 30.0–36.0)
MCV: 95.2 fL (ref 80.0–100.0)
Monocytes Absolute: 0.5 K/uL (ref 0.1–1.0)
Monocytes Relative: 8 %
Neutro Abs: 4 K/uL (ref 1.7–7.7)
Neutrophils Relative %: 66 %
Platelets: 237 K/uL (ref 150–400)
RBC: 4.19 MIL/uL (ref 3.87–5.11)
RDW: 14 % (ref 11.5–15.5)
WBC: 6 K/uL (ref 4.0–10.5)
nRBC: 0 % (ref 0.0–0.2)

## 2024-06-13 LAB — BASIC METABOLIC PANEL WITH GFR
Anion gap: 5 (ref 5–15)
BUN: 8 mg/dL (ref 8–23)
CO2: 27 mmol/L (ref 22–32)
Calcium: 8.6 mg/dL — ABNORMAL LOW (ref 8.9–10.3)
Chloride: 105 mmol/L (ref 98–111)
Creatinine, Ser: 0.8 mg/dL (ref 0.44–1.00)
GFR, Estimated: >60 mL/min (ref >60)
Glucose, Bld: 130 mg/dL — ABNORMAL HIGH (ref 70–99)
Potassium: 4.8 mmol/L (ref 3.5–5.1)
Sodium: 137 mmol/L (ref 135–145)

## 2024-06-13 LAB — HIV ANTIBODY (ROUTINE TESTING W REFLEX): HIV Screen 4th Generation wRfx: NONREACTIVE

## 2024-06-13 LAB — D-DIMER, QUANTITATIVE: D-Dimer, Quant: 0.49 ug{FEU}/mL (ref 0.00–0.50)

## 2024-06-13 MED ORDER — ALBUTEROL SULFATE (2.5 MG/3ML) 0.083% IN NEBU: 2.5000 mg | INHALATION_SOLUTION | RESPIRATORY_TRACT | 2 refills | Status: DC | PRN

## 2024-06-13 MED ORDER — ALBUTEROL SULFATE HFA 108 (90 BASE) MCG/ACT IN AERS: 2.0000 | INHALATION_SPRAY | Freq: Four times a day (QID) | RESPIRATORY_TRACT | 3 refills | Status: DC | PRN

## 2024-06-13 MED ORDER — CLONAZEPAM 0.5 MG PO TABS
1.0000 mg | ORAL_TABLET | Freq: Three times a day (TID) | ORAL | Status: DC
  Administered 2024-06-13: 1 mg via ORAL
  Filled 2024-06-13: qty 2

## 2024-06-13 MED ORDER — PREDNISONE 10 MG PO TABS: 30.0000 mg | ORAL_TABLET | Freq: Every day | ORAL | 0 refills | Status: AC

## 2024-06-13 NOTE — Discharge Instructions (Signed)
IMPORTANT INFORMATION: PAY CLOSE ATTENTION  ? ?PHYSICIAN DISCHARGE INSTRUCTIONS ? ?Follow with Primary care provider  Fusco, Lawrence, MD  and other consultants as instructed by your Hospitalist Physician ? ?SEEK MEDICAL CARE OR RETURN TO EMERGENCY ROOM IF SYMPTOMS COME BACK, WORSEN OR NEW PROBLEM DEVELOPS  ? ?Please note: ?You were cared for by a hospitalist during your hospital stay. Every effort will be made to forward records to your primary care provider.  You can request that your primary care provider send for your hospital records if they have not received them.  Once you are discharged, your primary care physician will handle any further medical issues. Please note that NO REFILLS for any discharge medications will be authorized once you are discharged, as it is imperative that you return to your primary care physician (or establish a relationship with a primary care physician if you do not have one) for your post hospital discharge needs so that they can reassess your need for medications and monitor your lab values. ? ?Please get a complete blood count and chemistry panel checked by your Primary MD at your next visit, and again as instructed by your Primary MD. ? ?Get Medicines reviewed and adjusted: ?Please take all your medications with you for your next visit with your Primary MD ? ?Laboratory/radiological data: ?Please request your Primary MD to go over all hospital tests and procedure/radiological results at the follow up, please ask your primary care provider to get all Hospital records sent to his/her office. ? ?In some cases, they will be blood work, cultures and biopsy results pending at the time of your discharge. Please request that your primary care provider follow up on these results. ? ?If you are diabetic, please bring your blood sugar readings with you to your follow up appointment with primary care.   ? ?Please call and make your follow up appointments as soon as possible.   ? ?Also Note  the following: ?If you experience worsening of your admission symptoms, develop shortness of breath, life threatening emergency, suicidal or homicidal thoughts you must seek medical attention immediately by calling 911 or calling your MD immediately  if symptoms less severe. ? ?You must read complete instructions/literature along with all the possible adverse reactions/side effects for all the Medicines you take and that have been prescribed to you. Take any new Medicines after you have completely understood and accpet all the possible adverse reactions/side effects.  ? ?Do not drive when taking Pain medications or sleeping medications (Benzodiazepines) ? ?Do not take more than prescribed Pain, Sleep and Anxiety Medications. It is not advisable to combine anxiety,sleep and pain medications without talking with your primary care practitioner ? ?Special Instructions: If you have smoked or chewed Tobacco  in the last 2 yrs please stop smoking, stop any regular Alcohol  and or any Recreational drug use. ? ?Wear Seat belts while driving.  Do not drive if taking any narcotic, mind altering or controlled substances or recreational drugs or alcohol.  ? ? ? ? ? ?

## 2024-06-13 NOTE — ED Notes (Signed)
Pt assisted onto the bedpan.

## 2024-06-13 NOTE — Care Management CC44 (Signed)
 Condition Code 44 Documentation Completed  Patient Details  Name: [name] MRN: [id number] Date of Birth: 1951/08/13   Condition Code 44 given:  Yes Patient signature on Condition Code 44 notice:  Yes Documentation of 2 MD's agreement:  Yes Code 44 added to claim:  Yes    Nena LITTIE Coffee, RN 06/13/2024, 10:11 AM

## 2024-06-13 NOTE — Discharge Summary (Signed)
 Physician Discharge Summary  [name] FMW:[id number] DOB: 20-Oct-1951 DOA: 06/12/2024  PCP: Bertell Satterfield, MD  Admit date: 06/12/2024 Discharge date: 06/13/2024  Admitted From:  Home  Disposition: Home   Recommendations for Outpatient Follow-up:  Follow up with PCP in 1 weeks  Home Health: NA  Discharge Condition: STABLE   CODE STATUS: FULL DIET: regular foods   Brief Hospitalization Summary: Please see all hospital notes, images, labs for full details of the hospitalization. Admission provider HPI:  73 year old female past medical history significant for COPD, HLD, seizure disorder, bipolar disease, HLD, remote history of CVA, history of breast cancer.  Per patient she has been short of breath ongoing for few days.  She had a mild wheeze.  Today her shortness of breath was worse.  Patient is a poor historian.  She additionally relates an odd feeling.  Something she is having difficulty explaining/describing.  She states she was going to the restroom when she became dizzy, she fell to the bathroom is much further away.  She felt weird and disoriented.  She had to call her husband for assistance.  He brought her to the ER.   In the ER patient given Solu-Medrol  125 mg, nebulizers x 2 as treatment for COPD exacerbation.  Patient ambulated and was satting 88%.  She was placed on 2 L oxygen .  Admission requested for COPD exacerbation.  Hospital Course Pt was admitted for acute COPD exacerbation and she responded favorably to treatments with bronchodilators and steroids.  She has weaned back down to room air and her wheezing and coughing has resolved. She is stable to discharge back to home.    Discharge Diagnoses:  Principal Problem:   Acute respiratory failure with hypoxia (HCC) Active Problems:   Tobacco use disorder   Acute exacerbation of COPD with asthma (HCC)   HLD (hyperlipidemia)   Seizure disorder (HCC)   History of CVA (cerebrovascular accident)   History of breast  cancer   Bipolar disorder [facility])   Discharge Instructions:  Allergies as of 06/13/2024       Reactions   Varenicline Hives   Codeine Nausea And Vomiting   Haemophilus B Polysaccharide Vaccine Nausea And Vomiting   Influenza Virus Vaccine Nausea And Vomiting   Lithium  Nausea And Vomiting        Medication List     STOP taking these medications    levofloxacin  500 MG tablet Commonly known as: LEVAQUIN    methylPREDNISolone  4 MG Tbpk tablet Commonly known as: MEDROL  DOSEPAK       TAKE these medications    acetaminophen  500 MG tablet Commonly known as: TYLENOL  Take 500 mg by mouth every 6 (six) hours as needed for mild pain (pain score 1-3).   albuterol  108 (90 Base) MCG/ACT inhaler Commonly known as: VENTOLIN  HFA Inhale 2 puffs into the lungs every 6 (six) hours as needed for wheezing or shortness of breath. What changed: Another medication with the same name was changed. Make sure you understand how and when to take each.   albuterol  (2.5 MG/3ML) 0.083% nebulizer solution Commonly known as: PROVENTIL  Take 3 mLs (2.5 mg total) by nebulization every 4 (four) hours as needed for wheezing or shortness of breath. What changed:  when to take this reasons to take this   atorvastatin  20 MG tablet Commonly known as: LIPITOR Take 20 mg by mouth every evening.   Breztri  Aerosphere 160-9-4.8 MCG/ACT Aero inhaler Generic drug: budesonide -glycopyrrolate -formoterol  Inhale 2 puffs into the lungs 2 (two) times daily.  carboxymethylcellulose 0.5 % Soln Commonly known as: REFRESH PLUS Place 1 drop into both eyes 3 (three) times daily as needed (dry/irritated eyes.).   clonazePAM  1 MG tablet Commonly known as: KlonoPIN  Take 1 tablet (1 mg total) by mouth 3 (three) times daily. What changed:  when to take this reasons to take this   dicyclomine  10 MG capsule Commonly known as: BENTYL  Take 10 mg by mouth 2 (two) times daily.   DULoxetine  20 MG capsule Commonly known  as: CYMBALTA  Take 20 mg by mouth daily.   gabapentin  300 MG capsule Commonly known as: NEURONTIN  TAKE (1) CAPSULE BY MOUTH THREE TIMES DAILY   hydrOXYzine 25 MG tablet Commonly known as: ATARAX Take 25 mg by mouth 2 (two) times daily.   lidocaine  5 % Commonly known as: Lidoderm  Place 1 patch onto the skin daily as needed. Remove & Discard patch within 12 hours or as directed by MD   multivitamin with minerals Tabs tablet Take 1 tablet by mouth 3 (three) times a week.   omeprazole 40 MG capsule Commonly known as: PRILOSEC Take 40 mg by mouth daily.   ondansetron  4 MG tablet Commonly known as: ZOFRAN  Take 4-8 mg by mouth every 6 (six) hours.   phenytoin  100 MG ER capsule Commonly known as: DILANTIN  Take 400 mg by mouth at bedtime.   predniSONE  10 MG tablet Commonly known as: DELTASONE  Take 3 tablets (30 mg total) by mouth daily with breakfast for 4 days. Start taking on: June 14, 2024   tamoxifen  20 MG tablet Commonly known as: NOLVADEX  TAKE ONE TABLET BY MOUTH ONCE DAILY.   tiZANidine 2 MG tablet Commonly known as: ZANAFLEX Take 4 mg by mouth every 8 (eight) hours.   VICKS VAPOINHALER IN Inhale 1 puff into the lungs daily as needed (congestion).   zolpidem  5 MG tablet Commonly known as: AMBIEN  Take 5 mg by mouth at bedtime as needed for sleep.        Follow-up Information     Bertell Satterfield, MD. Schedule an appointment as soon as possible for a visit in 1 week(s).   Specialty: Internal Medicine Why: Hospital Follow Up Contact information: [address] [facility] [place] [address] [phone]                Allergies  Allergen Reactions   Varenicline Hives   Codeine Nausea And Vomiting   Haemophilus B Polysaccharide Vaccine Nausea And Vomiting   Influenza Virus Vaccine Nausea And Vomiting   Lithium  Nausea And Vomiting   Allergies as of 06/13/2024       Reactions   Varenicline Hives   Codeine Nausea And Vomiting   Haemophilus  B Polysaccharide Vaccine Nausea And Vomiting   Influenza Virus Vaccine Nausea And Vomiting   Lithium  Nausea And Vomiting        Medication List     STOP taking these medications    levofloxacin  500 MG tablet Commonly known as: LEVAQUIN    methylPREDNISolone  4 MG Tbpk tablet Commonly known as: MEDROL  DOSEPAK       TAKE these medications    acetaminophen  500 MG tablet Commonly known as: TYLENOL  Take 500 mg by mouth every 6 (six) hours as needed for mild pain (pain score 1-3).   albuterol  108 (90 Base) MCG/ACT inhaler Commonly known as: VENTOLIN  HFA Inhale 2 puffs into the lungs every 6 (six) hours as needed for wheezing or shortness of breath. What changed: Another medication with the same name was changed. Make sure you  understand how and when to take each.   albuterol  (2.5 MG/3ML) 0.083% nebulizer solution Commonly known as: PROVENTIL  Take 3 mLs (2.5 mg total) by nebulization every 4 (four) hours as needed for wheezing or shortness of breath. What changed:  when to take this reasons to take this   atorvastatin  20 MG tablet Commonly known as: LIPITOR Take 20 mg by mouth every evening.   Breztri  Aerosphere 160-9-4.8 MCG/ACT Aero inhaler Generic drug: budesonide -glycopyrrolate -formoterol  Inhale 2 puffs into the lungs 2 (two) times daily.   carboxymethylcellulose 0.5 % Soln Commonly known as: REFRESH PLUS Place 1 drop into both eyes 3 (three) times daily as needed (dry/irritated eyes.).   clonazePAM  1 MG tablet Commonly known as: KlonoPIN  Take 1 tablet (1 mg total) by mouth 3 (three) times daily. What changed:  when to take this reasons to take this   dicyclomine  10 MG capsule Commonly known as: BENTYL  Take 10 mg by mouth 2 (two) times daily.   DULoxetine  20 MG capsule Commonly known as: CYMBALTA  Take 20 mg by mouth daily.   gabapentin  300 MG capsule Commonly known as: NEURONTIN  TAKE (1) CAPSULE BY MOUTH THREE TIMES DAILY   hydrOXYzine 25 MG  tablet Commonly known as: ATARAX Take 25 mg by mouth 2 (two) times daily.   lidocaine  5 % Commonly known as: Lidoderm  Place 1 patch onto the skin daily as needed. Remove & Discard patch within 12 hours or as directed by MD   multivitamin with minerals Tabs tablet Take 1 tablet by mouth 3 (three) times a week.   omeprazole 40 MG capsule Commonly known as: PRILOSEC Take 40 mg by mouth daily.   ondansetron  4 MG tablet Commonly known as: ZOFRAN  Take 4-8 mg by mouth every 6 (six) hours.   phenytoin  100 MG ER capsule Commonly known as: DILANTIN  Take 400 mg by mouth at bedtime.   predniSONE  10 MG tablet Commonly known as: DELTASONE  Take 3 tablets (30 mg total) by mouth daily with breakfast for 4 days. Start taking on: June 14, 2024   tamoxifen  20 MG tablet Commonly known as: NOLVADEX  TAKE ONE TABLET BY MOUTH ONCE DAILY.   tiZANidine 2 MG tablet Commonly known as: ZANAFLEX Take 4 mg by mouth every 8 (eight) hours.   VICKS VAPOINHALER IN Inhale 1 puff into the lungs daily as needed (congestion).   zolpidem  5 MG tablet Commonly known as: AMBIEN  Take 5 mg by mouth at bedtime as needed for sleep.        Procedures/Studies: CT ABDOMEN PELVIS W CONTRAST Result Date: 06/12/2024 CLINICAL DATA:  Epigastric pain EXAM: CT ABDOMEN AND PELVIS WITH CONTRAST TECHNIQUE: Multidetector CT imaging of the abdomen and pelvis was performed using the standard protocol following bolus administration of intravenous contrast. RADIATION DOSE REDUCTION: This exam was performed according to the departmental dose-optimization program which includes automated exposure control, adjustment of the mA and/or kV according to patient size and/or use of iterative reconstruction technique. CONTRAST:  OMNIPAQUE  IOHEXOL  300 MG/ML  SOLN COMPARISON:  01/12/2024 FINDINGS: Lower chest: No acute abnormality Hepatobiliary: Prior cholecystectomy. Intrahepatic and extrahepatic biliary ductal dilatation is stable  since prior study compatible with post cholecystectomy state. Pancreas: No focal abnormality or ductal dilatation. Spleen: No focal abnormality.  Normal size. Adrenals/Urinary Tract: Left adrenal adenoma is unchanged. Right adrenal gland and kidneys unremarkable. No stones or hydronephrosis. Urinary bladder unremarkable. Stomach/Bowel: Stomach, large and small bowel grossly unremarkable. Scattered sigmoid diverticula. No active diverticulitis. Vascular/Lymphatic: Aortic atherosclerosis. No evidence of aneurysm or adenopathy. Reproductive:  Prior hysterectomy.  No adnexal masses. Other: No free fluid or free air. Musculoskeletal: No acute bony abnormality. IMPRESSION: No acute findings in the abdomen or pelvis. Sigmoid diverticulosis.  No active diverticulitis. Aortic atherosclerosis. Electronically Signed   By: Franky Crease M.D.   On: 06/12/2024 22:18   DG Chest Port 1 View Result Date: 06/12/2024 CLINICAL DATA:  Chest pain. EXAM: PORTABLE CHEST 1 VIEW COMPARISON:  Mar 09, 2024 FINDINGS: The heart size and mediastinal contours are within normal limits. The lungs are hyperinflated with evidence of emphysematous lung disease. Mild atelectasis is suspected within the retrocardiac region of the left lung base. No pleural effusion or pneumothorax is identified. Multilevel degenerative changes seen throughout the thoracic spine. IMPRESSION: COPD with mild left basilar atelectasis. Electronically Signed   By: Suzen Dials M.D.   On: 06/12/2024 20:45     Subjective: Pt reports she is back to her baseline and she is wanting to go home now, she has taken off the oxygen  and she is no longer hypoxic and feeling much better, no wheezing or coughing.   Discharge Exam: Vitals:   06/13/24 0915 06/13/24 0930  BP: (!) 149/60 (!) 128/57  Pulse: 81 71  Resp: 15 19  Temp:    SpO2: 90% 92%   Vitals:   06/13/24 0723 06/13/24 0909 06/13/24 0915 06/13/24 0930  BP:   (!) 149/60 (!) 128/57  Pulse: 70  81 71  Resp: 19   15 19   Temp: 98.3 F (36.8 C)     TempSrc: Oral     SpO2: 96% 94% 90% 92%  Weight:      Height:       General: Pt is alert, awake, not in acute distress Cardiovascular: RRR, S1/S2 +, no rubs, no gallops Respiratory: CTA bilaterally, no wheezing, no rhonchi Abdominal: Soft, NT, ND, bowel sounds + Extremities: no edema, no cyanosis   The results of significant diagnostics from this hospitalization (including imaging, microbiology, ancillary and laboratory) are listed below for reference.     Microbiology: No results found for this or any previous visit (from the past 240 hours).   Labs: BNP (last 3 results) Recent Labs    10/01/23 0824 03/09/24 1834  BNP 231.0* 33.0   Basic Metabolic Panel: Recent Labs  Lab 06/12/24 2002 06/13/24 0345  NA 136 137  K 3.8 4.8  CL 103 105  CO2 22 27  GLUCOSE 100* 130*  BUN 8 8  CREATININE 0.74 0.80  CALCIUM  8.7* 8.6*   Liver Function Tests: Recent Labs  Lab 06/12/24 2002  AST 13*  ALT 13  ALKPHOS 74  BILITOT 0.6  PROT 6.8  ALBUMIN 3.6   Recent Labs  Lab 06/12/24 2002  LIPASE 38   No results for input(s): AMMONIA in the last 168 hours. CBC: Recent Labs  Lab 06/12/24 2002 06/13/24 0345  WBC 6.0 6.0  NEUTROABS  --  4.0  HGB 13.4 13.5  HCT 39.1 39.9  MCV 94.7 95.2  PLT 258 237   Cardiac Enzymes: No results for input(s): CKTOTAL, CKMB, CKMBINDEX, TROPONINI in the last 168 hours. BNP: Invalid input(s): POCBNP CBG: No results for input(s): GLUCAP in the last 168 hours. D-Dimer Recent Labs    06/13/24 0345  DDIMER 0.49   Hgb A1c No results for input(s): HGBA1C in the last 72 hours. Lipid Profile No results for input(s): CHOL, HDL, LDLCALC, TRIG, CHOLHDL, LDLDIRECT in the last 72 hours. Thyroid  function studies No results for input(s): TSH, T4TOTAL, T3FREE, THYROIDAB  in the last 72 hours.  Invalid input(s): FREET3 Anemia work up No results for input(s): VITAMINB12,  FOLATE, FERRITIN, TIBC, IRON , RETICCTPCT in the last 72 hours. Urinalysis    Component Value Date/Time   COLORURINE STRAW (A) 06/12/2024 2100   APPEARANCEUR CLEAR 06/12/2024 2100   LABSPEC 1.004 (L) 06/12/2024 2100   PHURINE 6.0 06/12/2024 2100   GLUCOSEU NEGATIVE 06/12/2024 2100   HGBUR MODERATE (A) 06/12/2024 2100   BILIRUBINUR NEGATIVE 06/12/2024 2100   KETONESUR NEGATIVE 06/12/2024 2100   PROTEINUR NEGATIVE 06/12/2024 2100   UROBILINOGEN 0.2 11/12/2012 1530   NITRITE NEGATIVE 06/12/2024 2100   LEUKOCYTESUR NEGATIVE 06/12/2024 2100   Sepsis Labs Recent Labs  Lab 06/12/24 2002 06/13/24 0345  WBC 6.0 6.0   Microbiology No results found for this or any previous visit (from the past 240 hours).  Time coordinating discharge:  41 mins  SIGNED:  Afton Louder, MD  [facility] 06/13/2024, 10:16 AM How to contact the [facility] Attending or Consulting provider 7A - 7P or covering provider during after hours 7P -7A, for this patient?  Check the care team in [facility] and look for a) attending/consulting [facility] provider listed and b) the [facility] team listed Log into [email or web address] and use [place]'s universal password to access. If you do not have the password, please contact the hospital operator. Locate the [facility] provider you are looking for under [facility] and page to a number that you can be directly reached. If you still have difficulty reaching the provider, please page the [facility] (Director on Call) for the Hospitalists listed on amion for assistance.

## 2024-06-13 NOTE — ED Notes (Signed)
 Patients husband Gaither called and provided an update.

## 2024-06-13 NOTE — Care Management Obs Status (Signed)
 MEDICARE OBSERVATION STATUS NOTIFICATION   Patient Details  Name: [name] MRN: [id number] Date of Birth: Mar 15, 1951   Medicare Observation Status Notification Given:  Yes    Nena LITTIE Coffee, RN 06/13/2024, 10:11 AM

## 2024-06-24 ENCOUNTER — Ambulatory Visit (INDEPENDENT_AMBULATORY_CARE_PROVIDER_SITE_OTHER): Payer: Self-pay

## 2024-06-24 VITALS — BP 135/62 | HR 101 | Ht 63.0 in | Wt 122.0 lb

## 2024-06-24 DIAGNOSIS — E782 Mixed hyperlipidemia: Secondary | ICD-10-CM

## 2024-06-24 DIAGNOSIS — K219 Gastro-esophageal reflux disease without esophagitis: Secondary | ICD-10-CM

## 2024-06-24 DIAGNOSIS — G894 Chronic pain syndrome: Secondary | ICD-10-CM | POA: Insufficient documentation

## 2024-06-24 MED ORDER — HYDROCODONE-ACETAMINOPHEN 10-325 MG PO TABS: 1.0000 | ORAL_TABLET | Freq: Three times a day (TID) | ORAL | 0 refills | Status: AC | PRN

## 2024-06-24 MED ORDER — ATORVASTATIN CALCIUM 20 MG PO TABS: 20.0000 mg | ORAL_TABLET | Freq: Every evening | ORAL | 3 refills | Status: AC

## 2024-06-24 MED ORDER — OMEPRAZOLE 40 MG PO CPDR: 40.0000 mg | DELAYED_RELEASE_CAPSULE | Freq: Every day | ORAL | 3 refills | Status: DC

## 2024-06-24 NOTE — Assessment & Plan Note (Signed)
 Agreed to refill pain medication for severe pain.  She was getting oxycodone  previous relief from PCP, Dr. Bertell.  Unable to prescribe oxycodone , however prescription for hydrocodone  sent in to [facility], PDMP was reviewed.

## 2024-06-24 NOTE — Progress Notes (Signed)
 New Patient Office Visit  Subjective    Patient ID: [name], female    DOB: 03-29-1951  Age: 73 y.o. MRN: [id number]  CC:  Chief Complaint  Patient presents with   Establish Care    Pt here to establish care    HPI CARMA DWIGGINS presents to establish care  Discussed the use of AI scribe software for clinical note transcription with the patient, who gave verbal consent to proceed.  History of Present Illness    [name] is a 73 year old female who presents for follow-up after a recent ER visit on 06/12/24 for COPD exacerbation.  Dyspnea and wheezing - Recent emergency room visit for SOB - Currently using a breathing machine at home with improvement in symptoms - Uses Breztri  inhaler regularly with benefit - Uses albuterol  inhaler as needed for wheezing, but did not bring it to the appointment - No new respiratory issues or concerns since hospital visit  Antibiotic use - Currently on a course of antibiotics with a few days remaining  Benzodiazepine use - Takes Klonopin  three times daily - Concerned about obtaining a refill  Cerumen impaction - History of earwax buildup - No current ear discomfort - Typically manages earwax herself but appreciates assistance with cleaning during visits     Outpatient Encounter Medications as of 06/24/2024  Medication Sig   acetaminophen  (TYLENOL ) 500 MG tablet Take 500 mg by mouth every 6 (six) hours as needed for mild pain (pain score 1-3).   albuterol  (PROVENTIL ) (2.5 MG/3ML) 0.083% nebulizer solution Take 3 mLs (2.5 mg total) by nebulization every 4 (four) hours as needed for wheezing or shortness of breath.   albuterol  (VENTOLIN  HFA) 108 (90 Base) MCG/ACT inhaler Inhale 2 puffs into the lungs every 6 (six) hours as needed for wheezing or shortness of breath.   Aromatic Inhalants (VICKS VAPOINHALER IN) Inhale 1 puff into the lungs daily as needed (congestion).   budeson-glycopyrrolate -formoterol  (BREZTRI  AEROSPHERE)  160-9-4.8 MCG/ACT AERO inhaler Inhale 2 puffs into the lungs 2 (two) times daily.   carboxymethylcellulose (REFRESH PLUS) 0.5 % SOLN Place 1 drop into both eyes 3 (three) times daily as needed (dry/irritated eyes.).   clonazePAM  (KLONOPIN ) 1 MG tablet Take 1 tablet (1 mg total) by mouth 3 (three) times daily. (Patient taking differently: Take 1 mg by mouth 3 (three) times daily as needed for anxiety.)   dicyclomine  (BENTYL ) 10 MG capsule Take 10 mg by mouth 2 (two) times daily.   DULoxetine  (CYMBALTA ) 20 MG capsule Take 20 mg by mouth daily.   gabapentin  (NEURONTIN ) 300 MG capsule TAKE (1) CAPSULE BY MOUTH THREE TIMES DAILY   HYDROcodone -acetaminophen  (NORCO) 10-325 MG tablet Take 1 tablet by mouth every 8 (eight) hours as needed for up to 7 days.   hydrOXYzine (ATARAX) 25 MG tablet Take 25 mg by mouth 2 (two) times daily.   lidocaine  (LIDODERM ) 5 % Place 1 patch onto the skin daily as needed. Remove & Discard patch within 12 hours or as directed by MD   Multiple Vitamin (MULTIVITAMIN WITH MINERALS) TABS tablet Take 1 tablet by mouth 3 (three) times a week.   ondansetron  (ZOFRAN ) 4 MG tablet Take 4-8 mg by mouth every 6 (six) hours.   phenytoin  (DILANTIN ) 100 MG ER capsule Take 400 mg by mouth at bedtime.   tamoxifen  (NOLVADEX ) 20 MG tablet TAKE ONE TABLET BY MOUTH ONCE DAILY.   tiZANidine (ZANAFLEX) 2 MG tablet Take 4 mg by mouth every 8 (eight) hours.  zolpidem  (AMBIEN ) 5 MG tablet Take 5 mg by mouth at bedtime as needed for sleep.   [DISCONTINUED] atorvastatin  (LIPITOR) 20 MG tablet Take 20 mg by mouth every evening.    [DISCONTINUED] omeprazole  (PRILOSEC) 40 MG capsule Take 40 mg by mouth daily.    atorvastatin  (LIPITOR) 20 MG tablet Take 1 tablet (20 mg total) by mouth every evening.   omeprazole  (PRILOSEC) 40 MG capsule Take 1 capsule (40 mg total) by mouth daily.   No facility-administered encounter medications on file as of 06/24/2024.    Past Medical History:  Diagnosis Date    Alcohol abuse, in remission    Anxiety    Arthritis    Bipolar disorder (HCC)    Colitis    COPD (chronic obstructive pulmonary disease) (HCC)    occasional home O2   Gastric ulcer    GERD (gastroesophageal reflux disease)    Hemorrhoids    IBS (irritable bowel syndrome)    Seizures (HCC)    last seizure was about a year ago   Stroke [facility])    right sided weakness-slight    Past Surgical History:  Procedure Laterality Date   ABDOMINAL HYSTERECTOMY     BIOPSY  09/13/2022   Procedure: BIOPSY;  Surgeon: Cindie Carlin POUR, DO;  [facility];  Service: Endoscopy;;   CHOLECYSTECTOMY     COLONOSCOPY WITH PROPOFOL  N/A 09/13/2022   Procedure: COLONOSCOPY WITH PROPOFOL ;  Surgeon: Cindie Carlin POUR, DO;  Location: [facility];  Service: Endoscopy;  Laterality: N/A;  11:15 am   COMPRESSION HIP SCREW Right 04/25/2019   Procedure: COMPRESSION HIP;  Surgeon: Margrette Taft BRAVO, MD;  Location: [facility];  Service: Orthopedics;  Laterality: Right;  1030am   ESOPHAGEAL BRUSHING  07/04/2022   Procedure: ESOPHAGEAL BRUSHING;  Surgeon: Shaaron Lamar HERO, MD;  [facility];  Service: Endoscopy;;   ESOPHAGOGASTRODUODENOSCOPY (EGD) WITH PROPOFOL  N/A 07/04/2022   Procedure: ESOPHAGOGASTRODUODENOSCOPY (EGD) WITH PROPOFOL ;  Surgeon: Shaaron Lamar HERO, MD;  [facility];  Service: Endoscopy;  Laterality: N/A;  ESOPHAGOGASTRODUODENOSCOPY WITH FOOD IMPACTION REMOVAL   ESOPHAGOGASTRODUODENOSCOPY (EGD) WITH PROPOFOL  N/A 09/13/2022   Procedure: ESOPHAGOGASTRODUODENOSCOPY (EGD) WITH PROPOFOL ;  Surgeon: Cindie Carlin POUR, DO;  [facility];  Service: Endoscopy;  Laterality: N/A;   EYE SURGERY Bilateral    cataract removal   MASTECTOMY, PARTIAL Right 07/20/2020   Procedure: MASTECTOMY PARTIAL;  Surgeon: Mavis Anes, MD;  Location: [facility];  Service: General;  Laterality: Right;   PARTIAL MASTECTOMY WITH AXILLARY SENTINEL LYMPH NODE BIOPSY Right 07/06/2020   Procedure: RIGHT PARTIAL  MASTECTOMY WITH AXILLARY SENTINEL LYMPH NODE BIOPSY;  Surgeon: Mavis Anes, MD;  Location: [facility];  Service: General;  Laterality: Right;   POLYPECTOMY  09/13/2022   Procedure: POLYPECTOMY;  Surgeon: Cindie Carlin POUR, DO;  [facility];  Service: Endoscopy;;    Family History  Problem Relation Age of Onset   Dementia Father     Social History   Socioeconomic History   Marital status: Married    Spouse name: Not on file   Number of children: Not on file   Years of education: Not on file   Highest education level: Not on file  Occupational History   Occupation: retired  Tobacco Use   Smoking status: Every Day    Current packs/day: 0.50    Average packs/day: 0.5 packs/day for 32.0 years (16.0 ttl pk-yrs)    Types: Cigarettes   Smokeless tobacco: Never  Vaping Use  Vaping status: Some Days  Substance and Sexual Activity   Alcohol use: No    Comment: h/o heavy use, quit in 1995   Drug use: Yes    Types: Marijuana    Comment: sometimes   Sexual activity: Not Currently  Other Topics Concern   Not on file  Social History Narrative   Not on file   Social Drivers of Health   Financial Resource Strain: Low Risk  (08/11/2020)   Overall Financial Resource Strain (CARDIA)    Difficulty of Paying Living Expenses: Not hard at all  Food Insecurity: No Food Insecurity (03/10/2024)   Hunger Vital Sign    Worried About Running Out of Food in the Last Year: Never true    Ran Out of Food in the Last Year: Never true  Transportation Needs: No Transportation Needs (03/10/2024)   PRAPARE - Administrator, Civil Service (Medical): No    Lack of Transportation (Non-Medical): No  Physical Activity: Insufficiently Active (08/11/2020)   Exercise Vital Sign    Days of Exercise per Week: 7 days    Minutes of Exercise per Session: 10 min  Stress: Stress Concern Present (08/11/2020)   [employer] of Occupational Health - Occupational Stress Questionnaire     Feeling of Stress : Rather much  Social Connections: Moderately Integrated (03/10/2024)   Social Connection and Isolation Panel    Frequency of Communication with Friends and Family: Three times a week    Frequency of Social Gatherings with Friends and Family: Once a week    Attends Religious Services: 1 to 4 times per year    Active Member of [employer] or Organizations: No    Attends Banker Meetings: Never    Marital Status: Married  Catering manager Violence: Not At Risk (03/10/2024)   Humiliation, Afraid, Rape, and Kick questionnaire    Fear of Current or Ex-Partner: No    Emotionally Abused: No    Physically Abused: No    Sexually Abused: No    ROS     Objective    BP 135/62   Pulse (!) 101   Ht 5' 3 (1.6 m)   Wt 122 lb 0.6 oz (55.4 kg)   SpO2 96%   BMI 21.62 kg/m   Physical Exam Vitals and nursing note reviewed.  Constitutional:      Appearance: Normal appearance. She is normal weight.  HENT:     Head: Normocephalic.     Right Ear: Tympanic membrane, ear canal and external ear normal.     Left Ear: Tympanic membrane, ear canal and external ear normal.     Nose: Nose normal.     Mouth/Throat:     Mouth: Mucous membranes are moist.     Pharynx: Oropharynx is clear.  Eyes:     Extraocular Movements: Extraocular movements intact.     Pupils: Pupils are equal, round, and reactive to light.  Cardiovascular:     Rate and Rhythm: Normal rate and regular rhythm.  Pulmonary:     Effort: Pulmonary effort is normal.     Breath sounds: Normal breath sounds.  Musculoskeletal:     Cervical back: Normal range of motion and neck supple.     Right lower leg: No edema.     Left lower leg: No edema.  Neurological:     Mental Status: She is oriented to person, place, and time. She is lethargic.     Gait: Gait abnormal (antalgic gait, ambulates with a cane).  Psychiatric:        Mood and Affect: Mood normal.        Behavior: Behavior is cooperative.        Thought  Content: Thought content normal.         Assessment & Plan:   Problem List Items Addressed This Visit       Digestive   GERD (gastroesophageal reflux disease) - Primary   Omeprazole  refilled.       Relevant Medications   omeprazole  (PRILOSEC) 40 MG capsule     Other   Chronic pain syndrome (Chronic)   Agreed to refill pain medication for severe pain.  She was getting oxycodone  previous relief from PCP, Dr. Bertell.  Unable to prescribe oxycodone , however prescription for hydrocodone  sent in to [facility], PDMP was reviewed.      Relevant Medications   HYDROcodone -acetaminophen  (NORCO) 10-325 MG tablet   HLD (hyperlipidemia)   Atorvastatin  20 mg refilled.      Relevant Medications   atorvastatin  (LIPITOR) 20 MG tablet             Return in about 6 months (around 12/25/2024).   Leita Longs, FNP

## 2024-06-24 NOTE — Assessment & Plan Note (Signed)
Atorvastatin 20 mg refilled. 

## 2024-06-24 NOTE — Assessment & Plan Note (Signed)
 Omeprazole refilled.

## 2024-06-26 ENCOUNTER — Telehealth: Payer: Self-pay

## 2024-06-26 NOTE — Telephone Encounter (Signed)
 Copied from CRM [id number]. Topic: Clinical - Medication Question >> Jun 26, 2024 12:23 PM Willma R wrote: Reason for CRM: Saddie from [facility] calling to inquire that patient is taking HYDROcodone -acetaminophen  (NORCO) 10-325 MG tablet, clonazePAM  (KLONOPIN ) 1 MG tablet, and Oxycodone  from another prescriber. Would like to know if the dosage or alternative can be prescribed due to the risk of CNS depression and fall.  Saddie can be reached at [phone]

## 2024-07-20 ENCOUNTER — Telehealth: Payer: Self-pay

## 2024-07-20 ENCOUNTER — Other Ambulatory Visit: Payer: Self-pay

## 2024-07-20 ENCOUNTER — Ambulatory Visit: Payer: Self-pay

## 2024-07-20 DIAGNOSIS — F411 Generalized anxiety disorder: Secondary | ICD-10-CM

## 2024-07-20 DIAGNOSIS — G894 Chronic pain syndrome: Secondary | ICD-10-CM

## 2024-07-20 MED ORDER — CLONAZEPAM 1 MG PO TABS: 1.0000 mg | ORAL_TABLET | Freq: Three times a day (TID) | ORAL | 0 refills | Status: DC | PRN

## 2024-07-20 MED ORDER — HYDROCODONE-ACETAMINOPHEN 10-325 MG PO TABS: 1.0000 | ORAL_TABLET | Freq: Four times a day (QID) | ORAL | 0 refills | Status: DC | PRN

## 2024-07-20 NOTE — Telephone Encounter (Signed)
 Patient does not want to be seen, states she is requesting refill of oxycodone  (unable to pend) or a little something for pain. Does not wish to schedule an appointment. Also requesting refill of Klonopin  (pended), states will be out on Friday. States provider who was prescribing them retired.   [facility] - [place], [place] - [address] [[id number]]   FYI Only or Action Required?: Action required by provider: medication refill request and clinical question for provider.  Patient was last seen in primary care on 06/24/2024 by Bevely Doffing, FNP.  Called Nurse Triage reporting Fall.  Symptoms began several years ago.  Interventions attempted: OTC medications: tylenol  once in awhile.  Symptoms are: unchanged.  Triage Disposition: See Physician Within 24 Hours  Patient/caregiver understands and will follow disposition?:  Reason for Disposition  Numbness in a leg or foot (i.e., loss of sensation)  Answer Assessment - Initial Assessment Questions States sustained right hip injury during fall, had surgery for it a couple of years ago. States has had chronic pain since.  1. LOCATION and RADIATION: Where is the pain located? Does the pain spread (shoot) anywhere else?     Radiates from waist down  2. QUALITY: What does the pain feel like?  (e.g., sharp, dull, aching, burning)     Unable to specify  3. SEVERITY: How bad is the pain? What does it keep you from doing?   (Scale 1-10; or mild, moderate, severe)     6/10  4. ONSET: When did the pain start? Does it come and go, or is it there all the time?     Years  Answer Assessment - Initial Assessment Questions 1. MECHANISM: How did the fall happen?     *No Answer* 2. DOMESTIC VIOLENCE AND ELDER ABUSE SCREENING: Did you fall because someone pushed you or tried to hurt you? If Yes, ask: Are you safe now?     *No Answer* 3. ONSET: When did the fall happen? (e.g., minutes, hours, or days ago)     *No  Answer* 4. LOCATION: What part of the body hit the ground? (e.g., back, buttocks, head, hips, knees, hands, head, stomach)     *No Answer* 5. INJURY: Did you hurt (injure) yourself when you fell? If Yes, ask: What did you injure? Tell me more about this? (e.g., body area; type of injury; pain severity)     *No Answer* 6. PAIN: Is there any pain? If Yes, ask: How bad is the pain? (e.g., Scale 0-10; or none, mild,      *No Answer* 7. SIZE: For cuts, bruises, or swelling, ask: How large is it? (e.g., inches or centimeters)      *No Answer* 8. PREGNANCY: Is there any chance you are pregnant? When was your last menstrual period?     *No Answer* 9. OTHER SYMPTOMS: Do you have any other symptoms? (e.g., dizziness, fever, weakness; new-onset or worsening).      *No Answer* 10. CAUSE: What do you think caused the fall (or falling)? (e.g., dizzy spell, tripped)       *No Answer*  Protocols used: Falls and Falling-A-AH, Hip Pain-A-AH Copied from CRM [id number]. Topic: Clinical - Red Word Triage >> Jul 20, 2024 10:18 AM Treva T wrote: [facility] that prompted transfer to Nurse Triage: Patient calling, states that she is having pain all over due to a recent fall.   States she has pain in both legs all the time, and it makes it difficult to walk.

## 2024-07-20 NOTE — Telephone Encounter (Unsigned)
 Copied from CRM [id number]. Topic: Clinical - Medication Refill >> Jul 20, 2024  4:52 PM Kevelyn M wrote: Medication: HYDROcodone -acetaminophen  (NORCO) 10-325 MG tablet, she is requesting this for Friday.  Has the patient contacted their pharmacy? No (Agent: If no, request that the patient contact the pharmacy for the refill. If patient does not wish to contact the pharmacy document the reason why and proceed with request.) (Agent: If yes, when and what did the pharmacy advise?)  This is the patient's preferred pharmacy:  [facility] - [place], [place] - [address] [place] [address] Phone: [phone] Fax: [phone]  Is this the correct pharmacy for this prescription? Yes If no, delete pharmacy and type the correct one.   Has the prescription been filled recently? No  Is the patient out of the medication? No  Has the patient been seen for an appointment in the last year OR does the patient have an upcoming appointment? Yes  Can we respond through MyChart? No  Agent: Please be advised that Rx refills may take up to 3 business days. We ask that you follow-up with your pharmacy.

## 2024-07-20 NOTE — Telephone Encounter (Signed)
 FYI Only or Action Required?: Action required by provider: medication refill request, clinical question for provider, and requests pcp call back today.  Patient was last seen in primary care on 06/24/2024 by Bevely Doffing, FNP.  Called Nurse Triage reporting Fall, Follow-up, and PCP Call back request.  Symptoms began did not disclose.  Interventions attempted: Other: no triage this call.  Symptoms are: no triage call.  Triage Disposition: Call PCP Within 24 Hours, See Physician Within 24 Hours  Patient/caregiver understands and will follow disposition?: No, wishes to speak with PCP    Reason for Disposition  [1] Caller requests to speak ONLY to PCP AND [2] NON-URGENT question  Answer Assessment - Initial Assessment Questions 1. REASON FOR CALL or QUESTION: What is your reason for calling today? or How can I best Patient calling back after earlier triage. She is requesting call back from PCP re: Klonopin  (4 days remain) and Norco (all out) or other pain medication. Declined secondary triage. States she needs a call back today.  Protocols used: PCP Call - No Triage-A-AH

## 2024-07-20 NOTE — Telephone Encounter (Signed)
 Pt called and wanted to know the status of medication.

## 2024-07-20 NOTE — Telephone Encounter (Signed)
 Copied from CRM [id number]. Topic: Clinical - Prescription Issue >> Jul 20, 2024  4:54 PM Kevelyn M wrote: Reason for CRM: Patient calling in to make sure she will have pills for this weekend. She will be out of them this weekend (clonazePAM  (KLONOPIN ) 1 MG tablet)

## 2024-07-20 NOTE — Telephone Encounter (Signed)
 Copied from CRM [id number]. Topic: Clinical - Medication Refill >> Jul 20, 2024  9:38 AM Tobias L wrote: Medication: clonazePAM  (KLONOPIN ) 1 MG tablet Oxycodone  1mg  tablet Patient states Dr. Bertell would give patient refills for medications. Patient requesting refills to be on file for prescriptions.   Has the patient contacted their pharmacy? Yes Told to to contact the office.   This is the patient's preferred pharmacy:  [facility] - [place], [place] - [address] [place] [address] Phone: [phone] Fax: [phone]  Is this the correct pharmacy for this prescription? Yes  Has the prescription been filled recently? No  Is the patient out of the medication? Yes  Has the patient been seen for an appointment in the last year OR does the patient have an upcoming appointment? Yes  Can we respond through MyChart? No  Agent: Please be advised that Rx refills may take up to 3 business days. We ask that you follow-up with your pharmacy.

## 2024-07-20 NOTE — Telephone Encounter (Signed)
 Message sent to PCP.

## 2024-07-21 NOTE — Telephone Encounter (Signed)
 Refills sent on 9/22

## 2024-08-06 ENCOUNTER — Other Ambulatory Visit: Payer: Self-pay

## 2024-08-06 DIAGNOSIS — F411 Generalized anxiety disorder: Secondary | ICD-10-CM

## 2024-08-06 MED ORDER — CLONAZEPAM 1 MG PO TABS: 1.0000 mg | ORAL_TABLET | Freq: Three times a day (TID) | ORAL | 0 refills | Status: DC | PRN

## 2024-08-12 ENCOUNTER — Other Ambulatory Visit: Payer: Self-pay

## 2024-08-12 DIAGNOSIS — K219 Gastro-esophageal reflux disease without esophagitis: Secondary | ICD-10-CM

## 2024-08-12 MED ORDER — OMEPRAZOLE 40 MG PO CPDR: 40.0000 mg | DELAYED_RELEASE_CAPSULE | Freq: Every day | ORAL | 3 refills | Status: AC

## 2024-08-12 NOTE — Telephone Encounter (Signed)
 Copied from CRM [id number]. Topic: Clinical - Medication Refill >> Aug 12, 2024  9:29 AM Tanazia G wrote: Medication:  omeprazole  (PRILOSEC) 40 MG capsule   Has the patient contacted their pharmacy? Yes (Agent: If no, request that the patient contact the pharmacy for the refill. If patient does not wish to contact the pharmacy document the reason why and proceed with request.) (Agent: If yes, when and what did the pharmacy advise?)  This is the patient's preferred pharmacy:  [facility] - [place], [place] - [address] [place] [address] Phone: [phone] Fax: [phone]  Is this the correct pharmacy for this prescription? Yes If no, delete pharmacy and type the correct one.   Has the prescription been filled recently? Yes  Is the patient out of the medication? Yes  Has the patient been seen for an appointment in the last year OR does the patient have an upcoming appointment? Yes  Can we respond through MyChart? Yes  Agent: Please be advised that Rx refills may take up to 3 business days. We ask that you follow-up with your pharmacy.

## 2024-08-15 ENCOUNTER — Other Ambulatory Visit: Payer: Self-pay

## 2024-08-15 ENCOUNTER — Emergency Department (HOSPITAL_COMMUNITY)
Admission: EM | Admit: 2024-08-15 | Discharge: 2024-08-15 | Disposition: A | Discharge status: Home / Self Care | Attending: Emergency Medicine | Admitting: Emergency Medicine

## 2024-08-15 DIAGNOSIS — R531 Weakness: Secondary | ICD-10-CM | POA: Diagnosis not present

## 2024-08-15 DIAGNOSIS — R059 Cough, unspecified: Secondary | ICD-10-CM | POA: Insufficient documentation

## 2024-08-15 DIAGNOSIS — F419 Anxiety disorder, unspecified: Secondary | ICD-10-CM | POA: Insufficient documentation

## 2024-08-15 DIAGNOSIS — R0602 Shortness of breath: Secondary | ICD-10-CM | POA: Diagnosis present

## 2024-08-15 LAB — RESP PANEL BY RT-PCR (RSV, FLU A&B, COVID)  RVPGX2
Influenza A by PCR: NEGATIVE
Influenza B by PCR: NEGATIVE
Resp Syncytial Virus by PCR: NEGATIVE
SARS Coronavirus 2 by RT PCR: NEGATIVE

## 2024-08-15 MED ORDER — CLONAZEPAM 0.5 MG PO TABS: 0.5000 mg | ORAL_TABLET | Freq: Two times a day (BID) | ORAL | 0 refills | Status: DC | PRN

## 2024-08-15 NOTE — ED Notes (Signed)
 EDP at [facility]

## 2024-08-15 NOTE — ED Provider Notes (Signed)
 [place] EMERGENCY DEPARTMENT AT [facility] Provider Note   CSN: [id number] Arrival date & time: 08/15/24  9253     Patient presents with: Weakness and Shortness of Breath   [name] is a 73 y.o. female.  {Add pertinent medical, surgical, social history, OB history to YEP:67052} Patient complains of mildly elevated blood pressure and anxiety.  She usually takes Klonopin  for anxiety   Weakness Associated symptoms: shortness of breath   Shortness of Breath      Prior to Admission medications   Medication Sig Start Date End Date Taking? Authorizing Provider  clonazePAM  (KLONOPIN ) 0.5 MG tablet Take 1 tablet (0.5 mg total) by mouth 2 (two) times daily as needed for anxiety. 08/15/24  Yes Suzette Pac, MD  acetaminophen  (TYLENOL ) 500 MG tablet Take 500 mg by mouth every 6 (six) hours as needed for mild pain (pain score 1-3).    [provider]  albuterol  (PROVENTIL ) (2.5 MG/3ML) 0.083% nebulizer solution Take 3 mLs (2.5 mg total) by nebulization every 4 (four) hours as needed for wheezing or shortness of breath. 06/13/24   Johnson, Clanford L, MD  albuterol  (VENTOLIN  HFA) 108 (90 Base) MCG/ACT inhaler Inhale 2 puffs into the lungs every 6 (six) hours as needed for wheezing or shortness of breath. 06/13/24   Johnson, Clanford L, MD  Aromatic Inhalants (VICKS VAPOINHALER IN) Inhale 1 puff into the lungs daily as needed (congestion).    [provider]  atorvastatin  (LIPITOR) 20 MG tablet Take 1 tablet (20 mg total) by mouth every evening. 06/24/24   Bevely Doffing, FNP  budeson-glycopyrrolate -formoterol  (BREZTRI  AEROSPHERE) 160-9-4.8 MCG/ACT AERO inhaler Inhale 2 puffs into the lungs 2 (two) times daily.    [provider]  carboxymethylcellulose (REFRESH PLUS) 0.5 % SOLN Place 1 drop into both eyes 3 (three) times daily as needed (dry/irritated eyes.).    [provider]  dicyclomine  (BENTYL ) 10 MG capsule Take 10 mg by mouth 2 (two)  times daily.    [provider]  DULoxetine  (CYMBALTA ) 20 MG capsule Take 20 mg by mouth daily. 02/04/24   [provider]  gabapentin  (NEURONTIN ) 300 MG capsule TAKE (1) CAPSULE BY MOUTH THREE TIMES DAILY 02/06/23   Rogers Hai, MD  hydrOXYzine (ATARAX) 25 MG tablet Take 25 mg by mouth 2 (two) times daily. 09/10/22   [provider]  lidocaine  (LIDODERM ) 5 % Place 1 patch onto the skin daily as needed. Remove & Discard patch within 12 hours or as directed by MD 09/10/23   Elnor Jayson LABOR, DO  Multiple Vitamin (MULTIVITAMIN WITH MINERALS) TABS tablet Take 1 tablet by mouth 3 (three) times a week.    [provider]  omeprazole  (PRILOSEC) 40 MG capsule Take 1 capsule (40 mg total) by mouth daily. 08/12/24   Bevely Doffing, FNP  ondansetron  (ZOFRAN ) 4 MG tablet Take 4-8 mg by mouth every 6 (six) hours. 11/18/23   [provider]  phenytoin  (DILANTIN ) 100 MG ER capsule Take 400 mg by mouth at bedtime.    [provider]  tamoxifen  (NOLVADEX ) 20 MG tablet TAKE ONE TABLET BY MOUTH ONCE DAILY. 11/15/22   Katragadda, Sreedhar, MD  tiZANidine (ZANAFLEX) 2 MG tablet Take 4 mg by mouth every 8 (eight) hours. 02/17/24   [provider]  zolpidem  (AMBIEN ) 5 MG tablet Take 5 mg by mouth at bedtime as needed for sleep. 11/30/23   [provider]    Allergies: Varenicline, Codeine, Haemophilus b polysaccharide vaccine, Influenza virus vaccine,  and Lithium     Review of Systems  Respiratory:  Positive for shortness of breath.   Neurological:  Positive for weakness.    Updated Vital Signs BP (!) 142/68   Pulse 66   Temp 98.8 F (37.1 C) (Oral)   Resp 20   Ht 5' 3.5 (1.613 m)   Wt 50.3 kg   SpO2 96%   BMI 19.35 kg/m   Physical Exam  (all labs ordered are listed, but only abnormal results are displayed) Labs Reviewed  RESP PANEL BY RT-PCR (RSV, FLU A&B, COVID)  RVPGX2    EKG: None  Radiology: No results  found.  {Document cardiac monitor, telemetry assessment procedure when appropriate:32947} Procedures   Medications Ordered in the ED - No data to display    {Click here for ABCD2, HEART and other calculators REFRESH Note before signing:1}                              Medical Decision Making Risk Prescription drug management.   Patient with blood pressure mildly elevated.  She will follow-up with the PCP.  Patient also given Klonopin  to use as needed for anxiety and she will also follow-up with her PCP for this  {Document critical care time when appropriate  Document review of labs and clinical decision tools ie CHADS2VASC2, etc  Document your independent review of radiology images and any outside records  Document your discussion with family members, caretakers and with consultants  Document social determinants of health affecting pt's care  Document your decision making why or why not admission, treatments were needed:32947:::1}   Final diagnoses:  Anxiety    ED Discharge Orders          Ordered    clonazePAM  (KLONOPIN ) 0.5 MG tablet  2 times daily PRN        08/15/24 0939

## 2024-08-15 NOTE — ED Triage Notes (Signed)
 BIB EMS c/o weakness over the last 2+ days. Also cough, non-productive. Pt became SOB and called out for headache and HTN (170/70 per EMS). Pt states headache went away then came back once at ED. Pt states she needs her klonopin  as she's been out and gets more on the 22nd.

## 2024-08-15 NOTE — Discharge Instructions (Signed)
 Follow-up with a family doctor to recheck your blood pressure and treat your anxiety.

## 2024-08-15 NOTE — ED Notes (Signed)
Husband at BS

## 2024-08-15 NOTE — ED Notes (Signed)
 Pt states, she is ready to go, feels better, husband is on his way to pick her up. Alert, NAD, calm, interactive.

## 2024-08-17 ENCOUNTER — Ambulatory Visit

## 2024-08-18 ENCOUNTER — Other Ambulatory Visit: Payer: Self-pay

## 2024-08-18 DIAGNOSIS — G894 Chronic pain syndrome: Secondary | ICD-10-CM

## 2024-08-20 ENCOUNTER — Ambulatory Visit: Payer: Self-pay

## 2024-08-20 NOTE — Telephone Encounter (Signed)
 FYI Only or Action Required?: Action required by provider: clinical question for provider and update on patient condition.  Patient was last seen in primary care on 06/24/2024 by Bevely Doffing, FNP.  Called Nurse Triage reporting Hypertension.  Symptoms began several days ago.  Interventions attempted: Other: was seen in the ED.  Symptoms are: Patient denies any symptoms at this time.  Triage Disposition: See PCP When Office is Open (Within 3 Days)  Patient/caregiver understands and will follow disposition?: No, wishes to speak with PCP     Copied from CRM #[id number]. Topic: Clinical - Medical Advice >> Aug 20, 2024  9:25 AM Larissa RAMAN wrote: Reason for CRM: Patient states she went to ER on 10/18 due to elevated BP. Attempted to schedule hospital f/u, however patient declined/refused appointment. She wants PCP to call in BP medication and pain medication to her pharmacy. Patient is requesting a callback from her PCP.       Reason for Disposition  Systolic BP >= 160 OR Diastolic >= 100  Answer Assessment - Initial Assessment Questions Patient has declined an appointment stating she would be unable to come in to the office. Patient is just requesting a prescription for blood pressure medication. I advised her she would likely need to be seen in the office but she again declined. Patient also wanted to make sure she would be getting refills for her pain medication and her Klonopin  going forward, and states she did get them refilled this month. Please advise.     1. BLOOD PRESSURE: What is your blood pressure? Did you take at least two measurements 5 minutes apart?     142/68 5 days ago, patient states it was 178 systolic at some point during her ED visit  2. ONSET: When did you take your blood pressure?     10/18 3. HOW: How did you take your blood pressure? (e.g., automatic home BP monitor, visiting nurse)     Taken in the ED 4. HISTORY: Do you have a history of high  blood pressure?     No 5. MEDICINES: Are you taking any medicines for blood pressure? Have you missed any doses recently?     No 6. OTHER SYMPTOMS: Do you have any symptoms? (e.g., blurred vision, chest pain, difficulty breathing, headache, weakness)     None at this time, intermittent headaches  Protocols used: Blood Pressure - High-A-AH

## 2024-08-24 ENCOUNTER — Telehealth: Payer: Self-pay

## 2024-08-24 NOTE — Telephone Encounter (Signed)
 Patient called after call hours left a voicemail need her pain medication refilled for her leg pain.  HYDROcodone -acetaminophen  [facility]) 10-325 MG tablet   Pharmacy  [facility] - [place], [place] - [address] [place], [place] [address] Phone: [phone]  Fax: [phone] DEA #: --  DAW Reason: --

## 2024-08-25 ENCOUNTER — Encounter (HOSPITAL_COMMUNITY): Payer: Self-pay | Admitting: Emergency Medicine

## 2024-08-25 ENCOUNTER — Emergency Department (HOSPITAL_COMMUNITY)
Admission: EM | Admit: 2024-08-25 | Discharge: 2024-08-25 | Disposition: A | Discharge status: Home / Self Care | Attending: Emergency Medicine | Admitting: Emergency Medicine

## 2024-08-25 ENCOUNTER — Emergency Department (HOSPITAL_COMMUNITY)

## 2024-08-25 ENCOUNTER — Ambulatory Visit: Payer: Self-pay

## 2024-08-25 DIAGNOSIS — R197 Diarrhea, unspecified: Secondary | ICD-10-CM | POA: Diagnosis not present

## 2024-08-25 DIAGNOSIS — R059 Cough, unspecified: Secondary | ICD-10-CM | POA: Insufficient documentation

## 2024-08-25 DIAGNOSIS — R0981 Nasal congestion: Secondary | ICD-10-CM | POA: Diagnosis not present

## 2024-08-25 DIAGNOSIS — J441 Chronic obstructive pulmonary disease with (acute) exacerbation: Secondary | ICD-10-CM

## 2024-08-25 DIAGNOSIS — R0602 Shortness of breath: Secondary | ICD-10-CM | POA: Diagnosis present

## 2024-08-25 DIAGNOSIS — J449 Chronic obstructive pulmonary disease, unspecified: Secondary | ICD-10-CM | POA: Insufficient documentation

## 2024-08-25 LAB — CBC WITH DIFFERENTIAL/PLATELET
Abs Immature Granulocytes: 0.01 K/uL (ref 0.00–0.07)
Basophils Absolute: 0.1 K/uL (ref 0.0–0.1)
Basophils Relative: 1 %
Eosinophils Absolute: 0.2 K/uL (ref 0.0–0.5)
Eosinophils Relative: 3 %
HCT: 38.9 % (ref 36.0–46.0)
Hemoglobin: 12.7 g/dL (ref 12.0–15.0)
Immature Granulocytes: 0 %
Lymphocytes Relative: 32 %
Lymphs Abs: 1.6 K/uL (ref 0.7–4.0)
MCH: 32.6 pg (ref 26.0–34.0)
MCHC: 32.6 g/dL (ref 30.0–36.0)
MCV: 99.7 fL (ref 80.0–100.0)
Monocytes Absolute: 0.6 K/uL (ref 0.1–1.0)
Monocytes Relative: 12 %
Neutro Abs: 2.6 K/uL (ref 1.7–7.7)
Neutrophils Relative %: 52 %
Platelets: 189 K/uL (ref 150–400)
RBC: 3.9 MIL/uL (ref 3.87–5.11)
RDW: 13.3 % (ref 11.5–15.5)
WBC: 5 K/uL (ref 4.0–10.5)
nRBC: 0 % (ref 0.0–0.2)

## 2024-08-25 LAB — COMPREHENSIVE METABOLIC PANEL WITH GFR
ALT: 10 U/L (ref 0–44)
AST: 15 U/L (ref 15–41)
Albumin: 4 g/dL (ref 3.5–5.0)
Alkaline Phosphatase: 96 U/L (ref 38–126)
Anion gap: 9 (ref 5–15)
BUN: 9 mg/dL (ref 8–23)
CO2: 27 mmol/L (ref 22–32)
Calcium: 8.5 mg/dL — ABNORMAL LOW (ref 8.9–10.3)
Chloride: 105 mmol/L (ref 98–111)
Creatinine, Ser: 0.81 mg/dL (ref 0.44–1.00)
GFR, Estimated: >60 mL/min (ref >60)
Glucose, Bld: 76 mg/dL (ref 70–99)
Potassium: 4.2 mmol/L (ref 3.5–5.1)
Sodium: 141 mmol/L (ref 135–145)
Total Bilirubin: 0.2 mg/dL (ref 0.0–1.2)
Total Protein: 6.6 g/dL (ref 6.5–8.1)

## 2024-08-25 LAB — TROPONIN T, HIGH SENSITIVITY
Troponin T High Sensitivity: <15 ng/L (ref 0–19)
Troponin T High Sensitivity: <15 ng/L (ref 0–19)

## 2024-08-25 LAB — URINALYSIS, ROUTINE W REFLEX MICROSCOPIC
Bilirubin Urine: NEGATIVE
Glucose, UA: NEGATIVE mg/dL
Hgb urine dipstick: NEGATIVE
Ketones, ur: NEGATIVE mg/dL
Leukocytes,Ua: NEGATIVE
Nitrite: NEGATIVE
Protein, ur: NEGATIVE mg/dL
Specific Gravity, Urine: 1.003 — ABNORMAL LOW (ref 1.005–1.030)
pH: 7 (ref 5.0–8.0)

## 2024-08-25 LAB — RESP PANEL BY RT-PCR (RSV, FLU A&B, COVID)  RVPGX2
Influenza A by PCR: NEGATIVE
Influenza B by PCR: NEGATIVE
Resp Syncytial Virus by PCR: NEGATIVE
SARS Coronavirus 2 by RT PCR: NEGATIVE

## 2024-08-25 LAB — PRO BRAIN NATRIURETIC PEPTIDE: Pro Brain Natriuretic Peptide: 142 pg/mL (ref <300.0)

## 2024-08-25 MED ORDER — IPRATROPIUM-ALBUTEROL 0.5-2.5 (3) MG/3ML IN SOLN
3.0000 mL | Freq: Once | RESPIRATORY_TRACT | Status: AC
  Administered 2024-08-25: 3 mL via RESPIRATORY_TRACT
  Filled 2024-08-25: qty 3

## 2024-08-25 MED ORDER — METHYLPREDNISOLONE SODIUM SUCC 125 MG IJ SOLR
125.0000 mg | Freq: Once | INTRAMUSCULAR | Status: AC
  Administered 2024-08-25: 125 mg via INTRAVENOUS
  Filled 2024-08-25: qty 2

## 2024-08-25 MED ORDER — DOXYCYCLINE HYCLATE 100 MG PO CAPS: 100.0000 mg | ORAL_CAPSULE | Freq: Two times a day (BID) | ORAL | 0 refills | Status: DC

## 2024-08-25 MED ORDER — PREDNISONE 10 MG PO TABS: 40.0000 mg | ORAL_TABLET | Freq: Every day | ORAL | 0 refills | Status: AC

## 2024-08-25 NOTE — ED Triage Notes (Signed)
 Pt arrived via [facility] from home c/o shob and cough x few days. She gets around with a Rolator at home, EMS placed pt on 2 L via [place], SpO2 in the 90s

## 2024-08-25 NOTE — Telephone Encounter (Signed)
 FYI Only or Action Required?: FYI only for provider.  Patient was last seen in primary care on 06/24/2024 by Bevely Doffing, FNP.  Called Nurse Triage reporting Shortness of Breath.  Symptoms began several weeks ago.  Interventions attempted: Prescription medications: inhaler.  Symptoms are: rapidly worsening.  Triage Disposition: Call EMS 911 Now  Patient/caregiver understands and will follow disposition?: Yes  Reason for Disposition  SEVERE difficulty breathing (e.g., struggling for each breath, speaks in single words)  Answer Assessment - Initial Assessment Questions When asked how patient is feeling, patient states I don't know, I just keep shaking. Also reports SOB that is intermittent and has been wheezing x 1 week. Pt unable to further describe her SOB but endorsees wheezing. She is unable to speak in full sentences.   Pt has hx of COPD, denies using home O2. Denies improvement with inhaler. SPO2 reading at home is 92%, states is her baseline. Her BP readings at home have been elevated, similar to ED where is was 171/100.   With patient's permission, this RN contacted 911 on her behalf.   Patient was confused with several of my questions. This RN requested to speak with her husband who confirmed that she is at her baseline and normally has confusion. He denies discoloration to her lips or fingertips. Advised him to put away their dog, unlock the door and gather her medications for EMS to review. This RN disconnected once EMS arrived, requesting that her husband or her call us  once discharged from the hospital.  1. RESPIRATORY STATUS: Describe your breathing? (e.g., wheezing, shortness of breath, unable to speak, severe coughing)      Speaking in clear but incomplete sentences  2. ONSET: When did this breathing problem begin?      1 week  3. PATTERN Does the difficult breathing come and go, or has it been constant since it started?      Intermittent  4. SEVERITY:  How bad is your breathing? (e.g., mild, moderate, severe)      Moderate, worse than baseline  5. RECURRENT SYMPTOM: Have you had difficulty breathing before? If Yes, ask: When was the last time? and What happened that time?      COPD  6. CARDIAC HISTORY: Do you have any history of heart disease? (e.g., heart attack, angina, bypass surgery, angioplasty)      Denies  7. LUNG HISTORY: Do you have any history of lung disease?  (e.g., pulmonary embolus, asthma, emphysema)     COPD  8. CAUSE: What do you think is causing the breathing problem?      Unknown, possible r/t COPD  9. OTHER SYMPTOMS: Do you have any other symptoms? (e.g., chest pain, cough, dizziness, fever, runny nose)     Don't feel right  10. O2 SATURATION MONITOR:  Do you use an oxygen  saturation monitor (pulse oximeter) at home? If Yes, ask: What is your reading (oxygen  level) today? What is your usual oxygen  saturation reading? (e.g., 95%)       92%  Protocols used: Breathing Difficulty-A-AH Copied from CRM #[id number]. Topic: Clinical - Red Word Triage >> Aug 25, 2024  8:55 AM Selinda RAMAN wrote: Red Word that prompted transfer to Nurse Triage: The patient called in stating she doesn't feel good and doesn't feel right. She went to [facility] last week where it was discovered she had high blood pressure like 174/100. She has not had an issue with that in the past or is not on any medication. She was not  prescribed anything either. She states she also has shortness of breath. I will transfer her to E2C2 NT.

## 2024-08-25 NOTE — Telephone Encounter (Signed)
 Noted, 911 dispatched

## 2024-08-25 NOTE — ED Provider Notes (Signed)
 [place] EMERGENCY DEPARTMENT AT [facility] Provider Note   CSN: [id number] Arrival date & time: 08/25/24  9050     Patient presents with: Shortness of Breath   [name] is a 73 y.o. female.   Patient is a 73 year old female who presents to the emergency department with a chief complaint of shortness of breath which has been worsening over the past 2 days.  She does have a history of COPD.  Patient notes that she has had increased productive cough.  She denies any associated chest pain.  She has had no abdominal pain, nausea or vomiting.  She does admit to some intermittent diarrhea.  She does not wear oxygen  at home.  She denies any associated dizziness, lightheadedness or syncope.  She denies any edema to lower extremities.   Shortness of Breath      Prior to Admission medications   Medication Sig Start Date End Date Taking? Authorizing Provider  acetaminophen  (TYLENOL ) 500 MG tablet Take 500 mg by mouth every 6 (six) hours as needed for mild pain (pain score 1-3).    [provider]  albuterol  (PROVENTIL ) (2.5 MG/3ML) 0.083% nebulizer solution Take 3 mLs (2.5 mg total) by nebulization every 4 (four) hours as needed for wheezing or shortness of breath. 06/13/24   Johnson, Clanford L, MD  albuterol  (VENTOLIN  HFA) 108 (90 Base) MCG/ACT inhaler Inhale 2 puffs into the lungs every 6 (six) hours as needed for wheezing or shortness of breath. 06/13/24   Johnson, Clanford L, MD  Aromatic Inhalants (VICKS VAPOINHALER IN) Inhale 1 puff into the lungs daily as needed (congestion).    [provider]  atorvastatin  (LIPITOR) 20 MG tablet Take 1 tablet (20 mg total) by mouth every evening. 06/24/24   Bevely Doffing, FNP  budeson-glycopyrrolate -formoterol  (BREZTRI  AEROSPHERE) 160-9-4.8 MCG/ACT AERO inhaler Inhale 2 puffs into the lungs 2 (two) times daily.    [provider]  carboxymethylcellulose (REFRESH PLUS) 0.5 % SOLN Place 1 drop into both eyes 3  (three) times daily as needed (dry/irritated eyes.).    [provider]  clonazePAM  (KLONOPIN ) 0.5 MG tablet Take 1 tablet (0.5 mg total) by mouth 2 (two) times daily as needed for anxiety. 08/15/24   Suzette Pac, MD  dicyclomine  (BENTYL ) 10 MG capsule Take 10 mg by mouth 2 (two) times daily.    [provider]  DULoxetine  (CYMBALTA ) 20 MG capsule Take 20 mg by mouth daily. 02/04/24   [provider]  gabapentin  (NEURONTIN ) 300 MG capsule TAKE (1) CAPSULE BY MOUTH THREE TIMES DAILY 02/06/23   Rogers Hai, MD  HYDROcodone -acetaminophen  (NORCO) 10-325 MG tablet Take 1 tablet by mouth every 6 (six) hours as needed for up to 7 days. 08/18/24 08/25/24  Bevely Doffing, FNP  hydrOXYzine (ATARAX) 25 MG tablet Take 25 mg by mouth 2 (two) times daily. 09/10/22   [provider]  lidocaine  (LIDODERM ) 5 % Place 1 patch onto the skin daily as needed. Remove & Discard patch within 12 hours or as directed by MD 09/10/23   Elnor Jayson LABOR, DO  Multiple Vitamin (MULTIVITAMIN WITH MINERALS) TABS tablet Take 1 tablet by mouth 3 (three) times a week.    [provider]  omeprazole  (PRILOSEC) 40 MG capsule Take 1 capsule (40 mg total) by mouth daily. 08/12/24   Bevely Doffing, FNP  ondansetron  (ZOFRAN ) 4 MG tablet Take 4-8 mg by mouth every 6 (six) hours. 11/18/23   [provider]  phenytoin  (DILANTIN ) 100 MG ER capsule  Take 400 mg by mouth at bedtime.    [provider]  tamoxifen  (NOLVADEX ) 20 MG tablet TAKE ONE TABLET BY MOUTH ONCE DAILY. 11/15/22   Katragadda, Sreedhar, MD  tiZANidine (ZANAFLEX) 2 MG tablet Take 4 mg by mouth every 8 (eight) hours. 02/17/24   [provider]  zolpidem  (AMBIEN ) 5 MG tablet Take 5 mg by mouth at bedtime as needed for sleep. 11/30/23   [provider]    Allergies: Varenicline, Codeine, Haemophilus b polysaccharide vaccine, Influenza virus vaccine, and Lithium     Review of Systems  Respiratory:   Positive for shortness of breath.   All other systems reviewed and are negative.   Updated Vital Signs BP (!) 127/111 (BP Location: Left Arm)   Pulse 71   Temp 98.5 F (36.9 C) (Oral)   Resp 17   SpO2 98%   Physical Exam Vitals and nursing note reviewed.  Constitutional:      General: She is not in acute distress.    Appearance: Normal appearance. She is not ill-appearing.  HENT:     Head: Normocephalic and atraumatic.     Nose: Nose normal.     Mouth/Throat:     Mouth: Mucous membranes are moist.  Eyes:     Extraocular Movements: Extraocular movements intact.     Conjunctiva/sclera: Conjunctivae normal.     Pupils: Pupils are equal, round, and reactive to light.  Cardiovascular:     Rate and Rhythm: Normal rate and regular rhythm.     Pulses: Normal pulses.     Heart sounds: Normal heart sounds. No murmur heard.    No gallop.  Pulmonary:     Effort: Pulmonary effort is normal. No tachypnea.     Breath sounds: Wheezing and rhonchi present. No decreased breath sounds or rales.  Chest:     Chest wall: No tenderness.  Abdominal:     General: Abdomen is flat. Bowel sounds are normal.     Palpations: Abdomen is soft.  Musculoskeletal:        General: Normal range of motion.     Cervical back: Normal range of motion and neck supple.     Right lower leg: No edema.     Left lower leg: No edema.  Skin:    General: Skin is warm and dry.     Findings: No rash.  Neurological:     General: No focal deficit present.     Mental Status: She is alert and oriented to person, place, and time. Mental status is at baseline.     Cranial Nerves: No cranial nerve deficit.     Motor: No weakness.  Psychiatric:        Mood and Affect: Mood normal.        Behavior: Behavior normal.        Thought Content: Thought content normal.        Judgment: Judgment normal.     (all labs ordered are listed, but only abnormal results are displayed) Labs Reviewed  RESP PANEL BY RT-PCR (RSV, FLU  A&B, COVID)  RVPGX2  COMPREHENSIVE METABOLIC PANEL WITH GFR  CBC WITH DIFFERENTIAL/PLATELET  URINALYSIS, ROUTINE W REFLEX MICROSCOPIC  PRO BRAIN NATRIURETIC PEPTIDE  TROPONIN T, HIGH SENSITIVITY    EKG: None  Radiology: No results found.   Procedures   Medications Ordered in the ED  ipratropium-albuterol  (DUONEB) 0.5-2.5 (3) MG/3ML nebulizer solution 3 mL (has no administration in time range)  ipratropium-albuterol  (DUONEB) 0.5-2.5 (3) MG/3ML nebulizer solution 3 mL (  has no administration in time range)  methylPREDNISolone  sodium succinate (SOLU-MEDROL ) 125 mg/2 mL injection 125 mg (has no administration in time range)                                    Medical Decision Making Amount and/or Complexity of Data Reviewed Labs: ordered. Radiology: ordered.  Risk Prescription drug management.   This patient presents to the ED for concern of cough, congestion, shortness of breath differential diagnosis includes COPD, CHF, ACS, pulmonary embolus, pericarditis, myocarditis, pneumonia, pneumothorax, hemothorax, acute viral syndrome    Additional history obtained:  Additional history obtained from medical records External records from outside source obtained and reviewed including medical records   Lab Tests:  I Ordered, and personally interpreted labs.  The pertinent results include: No leukocytosis, no anemia, normal kidney function liver function, unremarkable electrolytes, negative serial troponins, normal BNP, negative viral swab   Imaging Studies ordered:  I ordered imaging studies including chest x-ray I independently visualized and interpreted imaging which showed no acute cardiopulmonary process I agree with the radiologist interpretation   Medicines ordered and prescription drug management:  I ordered medication including DuoNeb, Solu-Medrol  for COPD Reevaluation of the patient after these medicines showed that the patient improved I have reviewed the  patients home medicines and have made adjustments as needed   Problem List / ED Course:  Patient is doing very well at this time and is stable for discharge home.  She has no associated hypoxia at this point on room air.  Suspect COPD exacerbation will continue management for this on an outpatient basis.  Do not suspect ACS at this time.  Chest x-ray demonstrated no indication for pneumonia, pneumothorax, hemothorax.  Do not suspect acute CHF at this point.  Low suspicion for pulmonary embolus in this patient.  Close follow-up with PCP was discussed as well as strict turn precautions for any new or worsening symptoms.  Patient voiced understanding and had no additional questions.   Social Determinants of Health:  None        Final diagnoses:  None    ED Discharge Orders     None          Daralene Lonni JONETTA DEVONNA 08/25/24 1247    Melvenia Motto, MD 08/25/24 [phone]

## 2024-08-26 ENCOUNTER — Other Ambulatory Visit: Payer: Self-pay

## 2024-08-26 DIAGNOSIS — G894 Chronic pain syndrome: Secondary | ICD-10-CM

## 2024-08-26 MED ORDER — HYDROCODONE-ACETAMINOPHEN 10-325 MG PO TABS: 1.0000 | ORAL_TABLET | ORAL | 0 refills | Status: AC | PRN

## 2024-08-26 NOTE — Telephone Encounter (Signed)
 Patient advised.

## 2024-08-26 NOTE — Telephone Encounter (Signed)
 Refill of hydrocodone  sent to [facility]

## 2024-09-02 ENCOUNTER — Ambulatory Visit: Payer: Self-pay | Admitting: Physician Assistant

## 2024-09-03 ENCOUNTER — Other Ambulatory Visit: Payer: Self-pay

## 2024-09-03 ENCOUNTER — Emergency Department (HOSPITAL_COMMUNITY)
Admission: EM | Admit: 2024-09-03 | Discharge: 2024-09-03 | Discharge status: Against Medical Advice | Attending: Emergency Medicine | Admitting: Emergency Medicine

## 2024-09-03 ENCOUNTER — Encounter (HOSPITAL_COMMUNITY): Payer: Self-pay | Admitting: *Deleted

## 2024-09-03 DIAGNOSIS — R1011 Right upper quadrant pain: Secondary | ICD-10-CM | POA: Diagnosis present

## 2024-09-03 LAB — CBC WITH DIFFERENTIAL/PLATELET
Abs Immature Granulocytes: 0.03 K/uL (ref 0.00–0.07)
Basophils Absolute: 0.1 K/uL (ref 0.0–0.1)
Basophils Relative: 1 %
Eosinophils Absolute: 0.1 K/uL (ref 0.0–0.5)
Eosinophils Relative: 1 %
HCT: 42.2 % (ref 36.0–46.0)
Hemoglobin: 14.3 g/dL (ref 12.0–15.0)
Immature Granulocytes: 0 %
Lymphocytes Relative: 28 %
Lymphs Abs: 2.4 K/uL (ref 0.7–4.0)
MCH: 32.6 pg (ref 26.0–34.0)
MCHC: 33.9 g/dL (ref 30.0–36.0)
MCV: 96.3 fL (ref 80.0–100.0)
Monocytes Absolute: 1.2 K/uL — ABNORMAL HIGH (ref 0.1–1.0)
Monocytes Relative: 14 %
Neutro Abs: 4.8 K/uL (ref 1.7–7.7)
Neutrophils Relative %: 56 %
Platelets: 281 K/uL (ref 150–400)
RBC: 4.38 MIL/uL (ref 3.87–5.11)
RDW: 13.4 % (ref 11.5–15.5)
WBC: 8.5 K/uL (ref 4.0–10.5)
nRBC: 0 % (ref 0.0–0.2)

## 2024-09-03 LAB — URINALYSIS, ROUTINE W REFLEX MICROSCOPIC
Bacteria, UA: NONE SEEN
Bilirubin Urine: NEGATIVE
Glucose, UA: NEGATIVE mg/dL
Ketones, ur: NEGATIVE mg/dL
Leukocytes,Ua: NEGATIVE
Nitrite: NEGATIVE
Protein, ur: NEGATIVE mg/dL
Specific Gravity, Urine: 1.005 (ref 1.005–1.030)
pH: 6 (ref 5.0–8.0)

## 2024-09-03 LAB — COMPREHENSIVE METABOLIC PANEL WITH GFR
ALT: 12 U/L (ref 0–44)
AST: 17 U/L (ref 15–41)
Albumin: 3.9 g/dL (ref 3.5–5.0)
Alkaline Phosphatase: 111 U/L (ref 38–126)
Anion gap: 15 (ref 5–15)
BUN: 17 mg/dL (ref 8–23)
CO2: 21 mmol/L — ABNORMAL LOW (ref 22–32)
Calcium: 8.3 mg/dL — ABNORMAL LOW (ref 8.9–10.3)
Chloride: 101 mmol/L (ref 98–111)
Creatinine, Ser: 0.96 mg/dL (ref 0.44–1.00)
GFR, Estimated: >60 mL/min (ref >60)
Glucose, Bld: 105 mg/dL — ABNORMAL HIGH (ref 70–99)
Potassium: 3.9 mmol/L (ref 3.5–5.1)
Sodium: 137 mmol/L (ref 135–145)
Total Bilirubin: 0.4 mg/dL (ref 0.0–1.2)
Total Protein: 6.6 g/dL (ref 6.5–8.1)

## 2024-09-03 LAB — LIPASE, BLOOD: Lipase: 20 U/L (ref 11–51)

## 2024-09-03 MED ORDER — SODIUM CHLORIDE 0.9 % IV SOLN: INTRAVENOUS | Status: DC

## 2024-09-03 MED ORDER — ONDANSETRON HCL 4 MG/2ML IJ SOLN
4.0000 mg | Freq: Once | INTRAMUSCULAR | Status: AC
  Administered 2024-09-03: 4 mg via INTRAVENOUS
  Filled 2024-09-03: qty 2

## 2024-09-03 MED ORDER — SODIUM CHLORIDE 0.9 % IV BOLUS
500.0000 mL | Freq: Once | INTRAVENOUS | Status: AC
  Administered 2024-09-03: 500 mL via INTRAVENOUS

## 2024-09-03 MED ORDER — FENTANYL CITRATE (PF) 100 MCG/2ML IJ SOLN
50.0000 ug | Freq: Once | INTRAMUSCULAR | Status: AC
  Administered 2024-09-03: 50 ug via INTRAVENOUS
  Filled 2024-09-03: qty 2

## 2024-09-03 NOTE — ED Provider Notes (Signed)
 [place] EMERGENCY DEPARTMENT AT [facility] Provider Note   CSN: [id number] Arrival date & time: 09/03/24  9057     Patient presents with: Abdominal Pain   [name] is a 73 y.o. female.   HPI Adult female presents with concern of right-sided pain.  Pain is in the right upper abdomen.  No relief with OTC medication.  Pain is nonradiating, with mild associate nausea, but no vomiting, no diarrhea, no fever. She is status postcholecystectomy.    Prior to Admission medications   Medication Sig Start Date End Date Taking? Authorizing Provider  acetaminophen  (TYLENOL ) 500 MG tablet Take 500 mg by mouth every 6 (six) hours as needed for mild pain (pain score 1-3).    [provider]  albuterol  (PROVENTIL ) (2.5 MG/3ML) 0.083% nebulizer solution Take 3 mLs (2.5 mg total) by nebulization every 4 (four) hours as needed for wheezing or shortness of breath. 06/13/24   Johnson, Clanford L, MD  albuterol  (VENTOLIN  HFA) 108 (90 Base) MCG/ACT inhaler Inhale 2 puffs into the lungs every 6 (six) hours as needed for wheezing or shortness of breath. 06/13/24   Johnson, Clanford L, MD  Aromatic Inhalants (VICKS VAPOINHALER IN) Inhale 1 puff into the lungs daily as needed (congestion).    [provider]  atorvastatin  (LIPITOR) 20 MG tablet Take 1 tablet (20 mg total) by mouth every evening. 06/24/24   Bevely Doffing, FNP  budeson-glycopyrrolate -formoterol  (BREZTRI  AEROSPHERE) 160-9-4.8 MCG/ACT AERO inhaler Inhale 2 puffs into the lungs 2 (two) times daily.    [provider]  carboxymethylcellulose (REFRESH PLUS) 0.5 % SOLN Place 1 drop into both eyes 3 (three) times daily as needed (dry/irritated eyes.).    [provider]  clonazePAM  (KLONOPIN ) 0.5 MG tablet Take 1 tablet (0.5 mg total) by mouth 2 (two) times daily as needed for anxiety. 08/15/24   Suzette Pac, MD  dicyclomine  (BENTYL ) 10 MG capsule Take 10 mg by mouth 2 (two) times daily.    [provider]  doxycycline  (VIBRAMYCIN ) 100 MG capsule Take 1 capsule (100 mg total) by mouth 2 (two) times daily. 08/25/24   Daralene Lonni BIRCH, PA-C  DULoxetine  (CYMBALTA ) 20 MG capsule Take 20 mg by mouth daily. 02/04/24   [provider]  gabapentin  (NEURONTIN ) 300 MG capsule TAKE (1) CAPSULE BY MOUTH THREE TIMES DAILY 02/06/23   Rogers Hai, MD  hydrOXYzine (ATARAX) 25 MG tablet Take 25 mg by mouth 2 (two) times daily. 09/10/22   [provider]  lidocaine  (LIDODERM ) 5 % Place 1 patch onto the skin daily as needed. Remove & Discard patch within 12 hours or as directed by MD 09/10/23   Elnor Jayson LABOR, DO  Multiple Vitamin (MULTIVITAMIN WITH MINERALS) TABS tablet Take 1 tablet by mouth 3 (three) times a week.    [provider]  omeprazole  (PRILOSEC) 40 MG capsule Take 1 capsule (40 mg total) by mouth daily. 08/12/24   Bevely Doffing, FNP  ondansetron  (ZOFRAN ) 4 MG tablet Take 4-8 mg by mouth every 6 (six) hours. 11/18/23   [provider]  phenytoin  (DILANTIN ) 100 MG ER capsule Take 400 mg by mouth at bedtime.    [provider]  tamoxifen  (NOLVADEX ) 20 MG tablet TAKE ONE TABLET BY MOUTH ONCE DAILY. 11/15/22   Katragadda, Sreedhar, MD  tiZANidine (ZANAFLEX) 2 MG tablet Take 4 mg by mouth every 8 (eight) hours. 02/17/24   [provider]  zolpidem  (AMBIEN ) 5 MG tablet Take 5 mg by mouth at  bedtime as needed for sleep. 11/30/23   [provider]    Allergies: Varenicline, Codeine, Haemophilus b polysaccharide vaccine, Influenza virus vaccine, and Lithium     Review of Systems  Updated Vital Signs BP (!) 117/55   Pulse 69   Temp 98 F (36.7 C) (Oral)   Resp 20   Ht 1.613 m (5' 3.5)   Wt 50.3 kg   SpO2 98%   BMI 19.35 kg/m   Physical Exam Vitals and nursing note reviewed.  Constitutional:      General: She is not in acute distress.    Appearance: She is well-developed.  HENT:     Head: Normocephalic and  atraumatic.  Eyes:     Conjunctiva/sclera: Conjunctivae normal.  Cardiovascular:     Rate and Rhythm: Normal rate and regular rhythm.  Pulmonary:     Effort: Pulmonary effort is normal. No respiratory distress.     Breath sounds: No stridor.  Abdominal:     General: There is no distension.     Tenderness: There is abdominal tenderness in the right upper quadrant. There is no guarding or rebound.  Skin:    General: Skin is warm and dry.  Neurological:     Mental Status: She is alert and oriented to person, place, and time.     Cranial Nerves: No cranial nerve deficit.  Psychiatric:        Mood and Affect: Mood normal.     (all labs ordered are listed, but only abnormal results are displayed) Labs Reviewed  CBC WITH DIFFERENTIAL/PLATELET - Abnormal; Notable for the following components:      Result Value   Monocytes Absolute 1.2 (*)    All other components within normal limits  URINALYSIS, ROUTINE W REFLEX MICROSCOPIC - Abnormal; Notable for the following components:   Color, Urine STRAW (*)    Hgb urine dipstick SMALL (*)    All other components within normal limits  COMPREHENSIVE METABOLIC PANEL WITH GFR  LIPASE, BLOOD    EKG: None  Radiology: No results found.   Procedures   Medications Ordered in the ED  fentaNYL  (SUBLIMAZE ) injection 50 mcg (50 mcg Intravenous Given 09/03/24 1143)  ondansetron  (ZOFRAN ) injection 4 mg (4 mg Intravenous Given 09/03/24 1143)  sodium chloride  0.9 % bolus 500 mL (0 mLs Intravenous Stopped 09/03/24 1214)                                    Medical Decision Making Elderly female, postcholecystectomy presents with right abdominal pain, but no associated complaints including fever nausea vomiting.  Absent gallbladder, without GI illness, differential including mass, gastritis, pancreatitis, hepatobiliary dysfunction, renal considered.   Amount and/or Complexity of Data Reviewed Labs: ordered. Decision-making details documented in ED  Course. Radiology: ordered.  Risk Prescription drug management. Decision regarding hospitalization. Diagnosis or treatment significantly limited by social determinants of health.   Patient requests to leave.  Patient's vital signs are unremarkable, she has no leukocytosis, urinalysis is unremarkable, chemistry panel not yet available and there is no early evidence for bacteremia, sepsis, but patient is leaving prior to completion of her evaluation.  Patient instructed to return for any concerning changes in her condition.    Final diagnoses:  Right upper quadrant abdominal pain    ED Discharge Orders     None          Garrick Charleston, MD 09/03/24 1316

## 2024-09-03 NOTE — Discharge Instructions (Signed)
 Do not hesitate to return for additional evaluation should you change your mind.

## 2024-09-03 NOTE — ED Triage Notes (Signed)
 Pt c/o right side pain that started today with no relief after taking medication at home

## 2024-09-04 ENCOUNTER — Other Ambulatory Visit: Payer: Self-pay

## 2024-09-04 ENCOUNTER — Emergency Department (HOSPITAL_COMMUNITY): Admission: EM | Admit: 2024-09-04 | Discharge: 2024-09-04 | Disposition: A | Discharge status: Home / Self Care

## 2024-09-04 ENCOUNTER — Emergency Department (HOSPITAL_COMMUNITY)

## 2024-09-04 ENCOUNTER — Encounter (HOSPITAL_COMMUNITY): Payer: Self-pay

## 2024-09-04 DIAGNOSIS — K5900 Constipation, unspecified: Secondary | ICD-10-CM | POA: Insufficient documentation

## 2024-09-04 DIAGNOSIS — R110 Nausea: Secondary | ICD-10-CM | POA: Insufficient documentation

## 2024-09-04 DIAGNOSIS — R109 Unspecified abdominal pain: Secondary | ICD-10-CM | POA: Diagnosis present

## 2024-09-04 LAB — COMPREHENSIVE METABOLIC PANEL WITH GFR
ALT: 11 U/L (ref 0–44)
AST: 15 U/L (ref 15–41)
Albumin: 3.7 g/dL (ref 3.5–5.0)
Alkaline Phosphatase: 106 U/L (ref 38–126)
Anion gap: 8 (ref 5–15)
BUN: 19 mg/dL (ref 8–23)
CO2: 25 mmol/L (ref 22–32)
Calcium: 8.4 mg/dL — ABNORMAL LOW (ref 8.9–10.3)
Chloride: 103 mmol/L (ref 98–111)
Creatinine, Ser: 1.01 mg/dL — ABNORMAL HIGH (ref 0.44–1.00)
GFR, Estimated: 58 mL/min — ABNORMAL LOW (ref >60)
Glucose, Bld: 113 mg/dL — ABNORMAL HIGH (ref 70–99)
Potassium: 5 mmol/L (ref 3.5–5.1)
Sodium: 137 mmol/L (ref 135–145)
Total Bilirubin: 0.5 mg/dL (ref 0.0–1.2)
Total Protein: 6.1 g/dL — ABNORMAL LOW (ref 6.5–8.1)

## 2024-09-04 LAB — CBC WITH DIFFERENTIAL/PLATELET
Abs Immature Granulocytes: 0.04 K/uL (ref 0.00–0.07)
Basophils Absolute: 0.1 K/uL (ref 0.0–0.1)
Basophils Relative: 1 %
Eosinophils Absolute: 0 K/uL (ref 0.0–0.5)
Eosinophils Relative: 1 %
HCT: 39.4 % (ref 36.0–46.0)
Hemoglobin: 13.3 g/dL (ref 12.0–15.0)
Immature Granulocytes: 1 %
Lymphocytes Relative: 24 %
Lymphs Abs: 1.6 K/uL (ref 0.7–4.0)
MCH: 32.8 pg (ref 26.0–34.0)
MCHC: 33.8 g/dL (ref 30.0–36.0)
MCV: 97.3 fL (ref 80.0–100.0)
Monocytes Absolute: 0.9 K/uL (ref 0.1–1.0)
Monocytes Relative: 13 %
Neutro Abs: 4 K/uL (ref 1.7–7.7)
Neutrophils Relative %: 60 %
Platelets: 258 K/uL (ref 150–400)
RBC: 4.05 MIL/uL (ref 3.87–5.11)
RDW: 13.3 % (ref 11.5–15.5)
WBC: 6.6 K/uL (ref 4.0–10.5)
nRBC: 0 % (ref 0.0–0.2)

## 2024-09-04 LAB — LIPASE, BLOOD: Lipase: 16 U/L (ref 11–51)

## 2024-09-04 MED ORDER — LIDOCAINE 5 % EX PTCH
1.0000 | MEDICATED_PATCH | CUTANEOUS | Status: DC
Start: 2024-09-04 — End: 2024-09-04
  Administered 2024-09-04: 1 via TRANSDERMAL
  Filled 2024-09-04: qty 1

## 2024-09-04 MED ORDER — LIDOCAINE 5 % EX PTCH: 1.0000 | MEDICATED_PATCH | CUTANEOUS | 0 refills | Status: DC

## 2024-09-04 MED ORDER — VALACYCLOVIR HCL 1 G PO TABS: 1000.0000 mg | ORAL_TABLET | Freq: Three times a day (TID) | ORAL | 0 refills | Status: AC

## 2024-09-04 MED ORDER — IOHEXOL 300 MG/ML  SOLN
100.0000 mL | Freq: Once | INTRAMUSCULAR | Status: AC | PRN
  Administered 2024-09-04: 80 mL via INTRAVENOUS

## 2024-09-04 MED ORDER — DICYCLOMINE HCL 10 MG/ML IM SOLN
20.0000 mg | Freq: Once | INTRAMUSCULAR | Status: AC
  Administered 2024-09-04: 20 mg via INTRAMUSCULAR
  Filled 2024-09-04: qty 2

## 2024-09-04 NOTE — ED Notes (Signed)
 Patient states she was told to take ibuprofen  when she was here yesterday but didn't take any. Instead called ems. Said she received something stronger yesterday. When asked if she wanted narcotic medications she said yes

## 2024-09-04 NOTE — ED Triage Notes (Addendum)
 Pt c/o abdominal pain. Seen here yesterday for same. Wanting narcotics.

## 2024-09-04 NOTE — ED Notes (Signed)
Pt taken to bathroom in wheelchair

## 2024-09-04 NOTE — Discharge Instructions (Addendum)
 Take your Valtrex if you develop a rash on your side.  You can use Tylenol  Motrin  as needed for pain.  You can alternate them every 3 hours.  You can also use your lidocaine  patches daily for pain.

## 2024-09-04 NOTE — ED Notes (Addendum)
 Incorrect note

## 2024-09-04 NOTE — ED Provider Notes (Signed)
 [place] EMERGENCY DEPARTMENT AT [facility] Provider Note   CSN: [id number] Arrival date & time: 09/04/24  9398     Patient presents with: No chief complaint on file.   [name] is a 72 y.o. female.   73 year old female presents for evaluation of abdominal pain.  She was here yesterday for the same but left before her workup came back.  She states the pain has gotten worse.  She states she is unsure why she left.  She states the pain starts in her right upper abdomen and radiates to the bilateral lower abdomen.  She has had a cholecystectomy before.  Admits to nausea but no vomiting.  Has also been constipated.  Denies any other symptoms or concerns.        Prior to Admission medications   Medication Sig Start Date End Date Taking? Authorizing Provider  lidocaine  (LIDODERM ) 5 % Place 1 patch onto the skin daily. Remove & Discard patch within 12 hours or as directed by MD 09/04/24  Yes Melessia Kaus L, DO  valACYclovir (VALTREX) 1000 MG tablet Take 1 tablet (1,000 mg total) by mouth 3 (three) times daily for 7 days. 09/04/24 09/11/24 Yes Rangel Echeverri L, DO  acetaminophen  (TYLENOL ) 500 MG tablet Take 500 mg by mouth every 6 (six) hours as needed for mild pain (pain score 1-3).    [provider]  albuterol  (PROVENTIL ) (2.5 MG/3ML) 0.083% nebulizer solution Take 3 mLs (2.5 mg total) by nebulization every 4 (four) hours as needed for wheezing or shortness of breath. 06/13/24   Johnson, Clanford L, MD  albuterol  (VENTOLIN  HFA) 108 (90 Base) MCG/ACT inhaler Inhale 2 puffs into the lungs every 6 (six) hours as needed for wheezing or shortness of breath. 06/13/24   Johnson, Clanford L, MD  Aromatic Inhalants (VICKS VAPOINHALER IN) Inhale 1 puff into the lungs daily as needed (congestion).    [provider]  atorvastatin  (LIPITOR) 20 MG tablet Take 1 tablet (20 mg total) by mouth every evening. 06/24/24   Bevely Doffing, FNP   budeson-glycopyrrolate -formoterol  (BREZTRI  AEROSPHERE) 160-9-4.8 MCG/ACT AERO inhaler Inhale 2 puffs into the lungs 2 (two) times daily.    [provider]  carboxymethylcellulose (REFRESH PLUS) 0.5 % SOLN Place 1 drop into both eyes 3 (three) times daily as needed (dry/irritated eyes.).    [provider]  clonazePAM  (KLONOPIN ) 0.5 MG tablet Take 1 tablet (0.5 mg total) by mouth 2 (two) times daily as needed for anxiety. 08/15/24   Suzette Pac, MD  dicyclomine  (BENTYL ) 10 MG capsule Take 10 mg by mouth 2 (two) times daily.    [provider]  doxycycline  (VIBRAMYCIN ) 100 MG capsule Take 1 capsule (100 mg total) by mouth 2 (two) times daily. 08/25/24   Daralene Lonni BIRCH, PA-C  DULoxetine  (CYMBALTA ) 20 MG capsule Take 20 mg by mouth daily. 02/04/24   [provider]  gabapentin  (NEURONTIN ) 300 MG capsule TAKE (1) CAPSULE BY MOUTH THREE TIMES DAILY 02/06/23   Rogers Hai, MD  hydrOXYzine (ATARAX) 25 MG tablet Take 25 mg by mouth 2 (two) times daily. 09/10/22   [provider]  lidocaine  (LIDODERM ) 5 % Place 1 patch onto the skin daily as needed. Remove & Discard patch within 12 hours or as directed by MD 09/10/23   Elnor Jayson LABOR, DO  Multiple Vitamin (MULTIVITAMIN WITH MINERALS) TABS tablet Take 1 tablet by mouth 3 (three) times a week.    [provider]  omeprazole  (PRILOSEC) 40 MG capsule  Take 1 capsule (40 mg total) by mouth daily. 08/12/24   Bevely Doffing, FNP  ondansetron  (ZOFRAN ) 4 MG tablet Take 4-8 mg by mouth every 6 (six) hours. 11/18/23   [provider]  phenytoin  (DILANTIN ) 100 MG ER capsule Take 400 mg by mouth at bedtime.    [provider]  tamoxifen  (NOLVADEX ) 20 MG tablet TAKE ONE TABLET BY MOUTH ONCE DAILY. 11/15/22   Katragadda, Sreedhar, MD  tiZANidine (ZANAFLEX) 2 MG tablet Take 4 mg by mouth every 8 (eight) hours. 02/17/24   [provider]  zolpidem  (AMBIEN ) 5 MG tablet Take 5 mg by  mouth at bedtime as needed for sleep. 11/30/23   [provider]    Allergies: Varenicline, Codeine, Haemophilus b polysaccharide vaccine, Influenza virus vaccine, and Lithium     Review of Systems  Constitutional:  Negative for chills and fever.  HENT:  Negative for ear pain and sore throat.   Eyes:  Negative for pain and visual disturbance.  Respiratory:  Negative for cough and shortness of breath.   Cardiovascular:  Negative for chest pain and palpitations.  Gastrointestinal:  Positive for abdominal pain and nausea. Negative for vomiting.  Genitourinary:  Negative for dysuria and hematuria.  Musculoskeletal:  Negative for arthralgias and back pain.  Skin:  Negative for color change and rash.  Neurological:  Negative for seizures and syncope.  All other systems reviewed and are negative.   Updated Vital Signs BP (!) 138/59 (BP Location: Left Arm)   Pulse 70   Temp 98.3 F (36.8 C) (Oral)   Resp 16   SpO2 97%   Physical Exam Vitals and nursing note reviewed.  Constitutional:      General: She is not in acute distress.    Appearance: She is well-developed. She is ill-appearing.     Comments: Chronically ill appearing  HENT:     Head: Normocephalic and atraumatic.  Eyes:     Conjunctiva/sclera: Conjunctivae normal.  Cardiovascular:     Rate and Rhythm: Normal rate and regular rhythm.     Heart sounds: No murmur heard. Pulmonary:     Effort: Pulmonary effort is normal. No respiratory distress.     Breath sounds: Normal breath sounds.  Abdominal:     Tenderness: There is abdominal tenderness.     Comments: Voluntary guarding, significant diffuse tenderness to palpation, worse in the RUQ  Musculoskeletal:        General: No swelling.     Cervical back: Neck supple.  Skin:    General: Skin is warm and dry.     Capillary Refill: Capillary refill takes less than 2 seconds.  Neurological:     Mental Status: She is alert.  Psychiatric:        Mood and Affect: Mood  normal.     (all labs ordered are listed, but only abnormal results are displayed) Labs Reviewed  COMPREHENSIVE METABOLIC PANEL WITH GFR - Abnormal; Notable for the following components:      Result Value   Glucose, Bld 113 (*)    Creatinine, Ser 1.01 (*)    Calcium  8.4 (*)    Total Protein 6.1 (*)    GFR, Estimated 58 (*)    All other components within normal limits  LIPASE, BLOOD  CBC WITH DIFFERENTIAL/PLATELET    EKG: None  Radiology: CT ABDOMEN PELVIS W CONTRAST Result Date: 09/04/2024 EXAM: CT ABDOMEN AND PELVIS WITH CONTRAST 09/04/2024 10:03:00 AM TECHNIQUE: CT of the abdomen and pelvis was performed with the administration  of 80 mL of iohexol  (OMNIPAQUE ) 300 MG/ML solution. Multiplanar reformatted images are provided for review. Automated exposure control, iterative reconstruction, and/or weight-based adjustment of the mA/kV was utilized to reduce the radiation dose to as low as reasonably achievable. COMPARISON: 06/12/2024 and previous. CLINICAL HISTORY: abd pain, RUQ, RLQ FINDINGS: LOWER CHEST: Minimal linear scarring or atelectasis posteriorly at the lung bases, left greater than right. LIVER: The liver is unremarkable. GALLBLADDER AND BILE DUCTS: Cholecystectomy clips. Stable ectatic extrahepatic biliary tree and mild central intrahepatic biliary ductal dilatation. SPLEEN: No acute abnormality. PANCREAS: No acute abnormality. 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 KIDNEYS, URETERS AND BLADDER: No stones in the kidneys or ureters. No hydronephrosis. No perinephric or periureteral stranding. Urinary bladder is unremarkable. GI AND BOWEL: Stomach demonstrates no acute abnormality. A few sigmoid diverticula without adjacent inflammatory/edema change. There is no bowel obstruction. PERITONEUM AND RETROPERITONEUM: No ascites. No free air. VASCULATURE: Aorta is normal in caliber. Moderately heavy aortoiliac  calcified plaque without an aneurysm. LYMPH NODES: No lymphadenopathy. REPRODUCTIVE ORGANS: Post hysterectomy. BONES AND SOFT TISSUES: Fixation hardware across the right femoral neck. 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 Mild lumbar facet degenerative joint disease L3-S1. No acute osseous abnormality. No focal soft tissue abnormality. IMPRESSION: 1. No acute findings in the abdomen or pelvis. 2. Stable ectatic extrahepatic biliary tree and mild central intrahepatic biliary ductal dilatation, post cholecystectomy. Electronically signed by: Katheleen Faes MD 09/04/2024 10:54 AM EST RP Workstation: [id number]     Procedures   Medications Ordered in the ED  lidocaine  (LIDODERM ) 5 % 1 patch (1 patch Transdermal Patch Applied 09/04/24 1134)  dicyclomine  (BENTYL ) injection 20 mg (20 mg Intramuscular Given 09/04/24 0749)  iohexol  (OMNIPAQUE ) 300 MG/ML solution 100 mL (80 mLs Intravenous Contrast Given 09/04/24 0955)                                    Medical Decision Making Patient here for abdominal pain after she left yesterday before her workup came back.  Labs today and yesterday were unremarkable but she was exquisitely tender on exam.  CT abdomen pelvis without any acute findings.  Suspect pain may be musculoskeletal, or she may be developing shingles as she does seem to have pain in a bandlike pattern.  Will give her valacyclovir to use if she develops a rash and will give her prescription for lidocaine  patches.  Advised to continue Tylenol  and Motrin  as needed for pain.  She feels comfortable with the plan to be discharged home.  Advise close follow-up with primary care and otherwise return to the ER for new or worsening symptoms.  Problems Addressed: Abdominal pain, non-surgical: acute illness or injury  Amount and/or Complexity of Data Reviewed External Data Reviewed: notes.    Details: Prior ED records reviewed  and patient had a negative workup yesterday, however left prior to having any imaging or her labs returning Labs: ordered. Decision-making details documented in ED Course.    Details: Ordered and reviewed by me and unremarkable Radiology: ordered and independent interpretation performed. Decision-making details documented in ED Course.    Details: Ordered and reviewed by me CT abdomen pelvis: Shows no acute abnormality in the abdomen   Risk OTC drugs. Prescription drug management. Drug therapy requiring intensive monitoring for toxicity.     Final diagnoses:  Abdominal pain, non-surgical    ED Discharge Orders          Ordered    valACYclovir (VALTREX) 1000 MG tablet  3 times daily        09/04/24 1112    lidocaine  (LIDODERM ) 5 %  Every 24 hours        09/04/24 1112               Wessie Shanks L, DO 09/04/24 1340

## 2024-09-06 ENCOUNTER — Emergency Department (HOSPITAL_COMMUNITY)

## 2024-09-06 ENCOUNTER — Encounter (HOSPITAL_COMMUNITY): Payer: Self-pay | Admitting: Emergency Medicine

## 2024-09-06 ENCOUNTER — Other Ambulatory Visit: Payer: Self-pay

## 2024-09-06 ENCOUNTER — Observation Stay (HOSPITAL_COMMUNITY)
Admission: EM | Admit: 2024-09-06 | Discharge: 2024-09-08 | Disposition: A | Discharge status: Home / Self Care | Attending: Internal Medicine | Admitting: Internal Medicine

## 2024-09-06 DIAGNOSIS — Z79899 Other long term (current) drug therapy: Secondary | ICD-10-CM | POA: Insufficient documentation

## 2024-09-06 DIAGNOSIS — F1090 Alcohol use, unspecified, uncomplicated: Secondary | ICD-10-CM | POA: Diagnosis not present

## 2024-09-06 DIAGNOSIS — E876 Hypokalemia: Secondary | ICD-10-CM | POA: Diagnosis not present

## 2024-09-06 DIAGNOSIS — W06XXXA Fall from bed, initial encounter: Secondary | ICD-10-CM | POA: Diagnosis not present

## 2024-09-06 DIAGNOSIS — F319 Bipolar disorder, unspecified: Secondary | ICD-10-CM | POA: Diagnosis not present

## 2024-09-06 DIAGNOSIS — F1290 Cannabis use, unspecified, uncomplicated: Secondary | ICD-10-CM | POA: Insufficient documentation

## 2024-09-06 DIAGNOSIS — S32511A Fracture of superior rim of right pubis, initial encounter for closed fracture: Principal | ICD-10-CM | POA: Insufficient documentation

## 2024-09-06 DIAGNOSIS — G894 Chronic pain syndrome: Secondary | ICD-10-CM | POA: Diagnosis present

## 2024-09-06 DIAGNOSIS — S32592A Other specified fracture of left pubis, initial encounter for closed fracture: Secondary | ICD-10-CM

## 2024-09-06 DIAGNOSIS — R296 Repeated falls: Secondary | ICD-10-CM | POA: Diagnosis present

## 2024-09-06 DIAGNOSIS — Z853 Personal history of malignant neoplasm of breast: Secondary | ICD-10-CM | POA: Diagnosis not present

## 2024-09-06 DIAGNOSIS — J449 Chronic obstructive pulmonary disease, unspecified: Secondary | ICD-10-CM | POA: Diagnosis present

## 2024-09-06 DIAGNOSIS — Z8669 Personal history of other diseases of the nervous system and sense organs: Secondary | ICD-10-CM | POA: Diagnosis not present

## 2024-09-06 DIAGNOSIS — Z8673 Personal history of transient ischemic attack (TIA), and cerebral infarction without residual deficits: Secondary | ICD-10-CM | POA: Diagnosis not present

## 2024-09-06 DIAGNOSIS — S32591A Other specified fracture of right pubis, initial encounter for closed fracture: Secondary | ICD-10-CM | POA: Diagnosis present

## 2024-09-06 DIAGNOSIS — G40909 Epilepsy, unspecified, not intractable, without status epilepticus: Secondary | ICD-10-CM

## 2024-09-06 DIAGNOSIS — F17210 Nicotine dependence, cigarettes, uncomplicated: Secondary | ICD-10-CM | POA: Insufficient documentation

## 2024-09-06 DIAGNOSIS — S32512A Fracture of superior rim of left pubis, initial encounter for closed fracture: Secondary | ICD-10-CM | POA: Insufficient documentation

## 2024-09-06 DIAGNOSIS — S3282XA Multiple fractures of pelvis without disruption of pelvic ring, initial encounter for closed fracture: Secondary | ICD-10-CM

## 2024-09-06 DIAGNOSIS — W19XXXA Unspecified fall, initial encounter: Principal | ICD-10-CM

## 2024-09-06 LAB — CBC WITH DIFFERENTIAL/PLATELET
Abs Immature Granulocytes: 0.6 K/uL — ABNORMAL HIGH (ref 0.00–0.07)
Basophils Absolute: 0 K/uL (ref 0.0–0.1)
Basophils Relative: 0 %
Eosinophils Absolute: 0 K/uL (ref 0.0–0.5)
Eosinophils Relative: 0 %
HCT: 37.7 % (ref 36.0–46.0)
Hemoglobin: 12.9 g/dL (ref 12.0–15.0)
Lymphocytes Relative: 10 %
Lymphs Abs: 1.4 K/uL (ref 0.7–4.0)
MCH: 32.7 pg (ref 26.0–34.0)
MCHC: 34.2 g/dL (ref 30.0–36.0)
MCV: 95.7 fL (ref 80.0–100.0)
Metamyelocytes Relative: 1 %
Monocytes Absolute: 0.6 K/uL (ref 0.1–1.0)
Monocytes Relative: 4 %
Myelocytes: 2 %
Neutro Abs: 11.6 K/uL — ABNORMAL HIGH (ref 1.7–7.7)
Neutrophils Relative %: 82 %
Platelets: 208 K/uL (ref 150–400)
Promyelocytes Relative: 1 %
RBC: 3.94 MIL/uL (ref 3.87–5.11)
RDW: 13.2 % (ref 11.5–15.5)
Smear Review: NORMAL
WBC: 14.1 K/uL — ABNORMAL HIGH (ref 4.0–10.5)
nRBC: 0 % (ref 0.0–0.2)

## 2024-09-06 LAB — COMPREHENSIVE METABOLIC PANEL WITH GFR
ALT: 21 U/L (ref 0–44)
AST: 23 U/L (ref 15–41)
Albumin: 3.6 g/dL (ref 3.5–5.0)
Alkaline Phosphatase: 114 U/L (ref 38–126)
Anion gap: 13 (ref 5–15)
BUN: 14 mg/dL (ref 8–23)
CO2: 21 mmol/L — ABNORMAL LOW (ref 22–32)
Calcium: 8.1 mg/dL — ABNORMAL LOW (ref 8.9–10.3)
Chloride: 99 mmol/L (ref 98–111)
Creatinine, Ser: 0.77 mg/dL (ref 0.44–1.00)
GFR, Estimated: >60 mL/min (ref >60)
Glucose, Bld: 152 mg/dL — ABNORMAL HIGH (ref 70–99)
Potassium: 3.4 mmol/L — ABNORMAL LOW (ref 3.5–5.1)
Sodium: 134 mmol/L — ABNORMAL LOW (ref 135–145)
Total Bilirubin: 0.7 mg/dL (ref 0.0–1.2)
Total Protein: 6.3 g/dL — ABNORMAL LOW (ref 6.5–8.1)

## 2024-09-06 LAB — TROPONIN T, HIGH SENSITIVITY
Troponin T High Sensitivity: <15 ng/L (ref 0–19)
Troponin T High Sensitivity: <15 ng/L (ref 0–19)

## 2024-09-06 MED ORDER — MORPHINE SULFATE (PF) 4 MG/ML IV SOLN
4.0000 mg | Freq: Once | INTRAVENOUS | Status: AC
  Administered 2024-09-06: 4 mg via INTRAVENOUS
  Filled 2024-09-06: qty 1

## 2024-09-06 MED ORDER — ALBUTEROL SULFATE (2.5 MG/3ML) 0.083% IN NEBU: 2.5000 mg | INHALATION_SOLUTION | RESPIRATORY_TRACT | Status: DC | PRN

## 2024-09-06 MED ORDER — POTASSIUM CHLORIDE IN NACL 20-0.9 MEQ/L-% IV SOLN
INTRAVENOUS | Status: AC
  Administered 2024-09-07: 100 mL/h via INTRAVENOUS

## 2024-09-06 MED ORDER — PHENYTOIN SODIUM EXTENDED 100 MG PO CAPS
400.0000 mg | ORAL_CAPSULE | Freq: Every day | ORAL | Status: DC
  Administered 2024-09-06 – 2024-09-07 (×2): 400 mg via ORAL
  Filled 2024-09-06 (×2): qty 4

## 2024-09-06 MED ORDER — TAMOXIFEN CITRATE 10 MG PO TABS
20.0000 mg | ORAL_TABLET | Freq: Every day | ORAL | Status: DC
  Administered 2024-09-07 – 2024-09-08 (×2): 20 mg via ORAL
  Filled 2024-09-06 (×2): qty 2

## 2024-09-06 MED ORDER — ENOXAPARIN SODIUM 40 MG/0.4ML IJ SOSY
40.0000 mg | PREFILLED_SYRINGE | INTRAMUSCULAR | Status: DC
  Administered 2024-09-06 – 2024-09-07 (×2): 40 mg via SUBCUTANEOUS
  Filled 2024-09-06 (×2): qty 0.4

## 2024-09-06 MED ORDER — POTASSIUM CHLORIDE CRYS ER 20 MEQ PO TBCR
40.0000 meq | EXTENDED_RELEASE_TABLET | Freq: Once | ORAL | Status: AC
  Administered 2024-09-06: 40 meq via ORAL
  Filled 2024-09-06: qty 2

## 2024-09-06 MED ORDER — SODIUM CHLORIDE 0.9 % IV BOLUS
500.0000 mL | Freq: Once | INTRAVENOUS | Status: AC
  Administered 2024-09-06: 500 mL via INTRAVENOUS

## 2024-09-06 MED ORDER — PANTOPRAZOLE SODIUM 40 MG PO TBEC
40.0000 mg | DELAYED_RELEASE_TABLET | Freq: Every day | ORAL | Status: DC
  Administered 2024-09-06 – 2024-09-08 (×3): 40 mg via ORAL
  Filled 2024-09-06 (×3): qty 1

## 2024-09-06 MED ORDER — BUDESON-GLYCOPYRROL-FORMOTEROL 160-9-4.8 MCG/ACT IN AERO
2.0000 | INHALATION_SPRAY | Freq: Two times a day (BID) | RESPIRATORY_TRACT | Status: DC
  Administered 2024-09-06 – 2024-09-08 (×4): 2 via RESPIRATORY_TRACT
  Filled 2024-09-06: qty 5.9

## 2024-09-06 MED ORDER — MORPHINE SULFATE (PF) 2 MG/ML IV SOLN
2.0000 mg | INTRAVENOUS | Status: DC | PRN
  Administered 2024-09-06 – 2024-09-07 (×2): 2 mg via INTRAVENOUS
  Filled 2024-09-06 (×2): qty 1

## 2024-09-06 MED ORDER — ONDANSETRON HCL 4 MG/2ML IJ SOLN
4.0000 mg | Freq: Once | INTRAMUSCULAR | Status: AC
  Administered 2024-09-06: 4 mg via INTRAVENOUS
  Filled 2024-09-06: qty 2

## 2024-09-06 NOTE — ED Notes (Signed)
 ED TO INPATIENT HANDOFF REPORT  ED Nurse Name and Phone #: [phone]  S Name/Age/Gender [name] 73 y.o. female Room/Bed: APA03/APA03  Code Status   Code Status: Prior  Home/SNF/Other Home Patient oriented to: self, place, time, and situation Is this baseline? Yes   Triage Complete: Triage complete  Chief Complaint Falls [R29.6]  Triage Note Arrived from home with [facility]. Fall around 11am. Bilateral leg pain 15-10. Pt can't remember fall. V/S stable with EMS. Pt ambulatory at baseline. Aox4.    Allergies Allergies  Allergen Reactions   Varenicline Hives   Codeine Nausea And Vomiting   Haemophilus B Polysaccharide Vaccine Nausea And Vomiting   Influenza Virus Vaccine Nausea And Vomiting   Lithium  Nausea And Vomiting    Level of Care/Admitting Diagnosis ED Disposition     ED Disposition  Admit   Condition  --   Comment  Hospital Area: [facility] [[id number]]  Level of Care: Med-Surg [16]  Diagnosis: Falls [phone]  Admitting Physician: EMOKPAE, EJIROGHENE E [[id number]]  Attending Physician: EMOKPAE, EJIROGHENE E [email or web address]          B Medical/Surgery History Past Medical History:  Diagnosis Date   Alcohol abuse, in remission    Anxiety    Arthritis    Bipolar disorder (HCC)    Colitis    COPD (chronic obstructive pulmonary disease) (HCC)    occasional home O2   Gastric ulcer    GERD (gastroesophageal reflux disease)    Hemorrhoids    IBS (irritable bowel syndrome)    Seizures (HCC)    last seizure was about a year ago   Stroke [facility])    right sided weakness-slight   Past Surgical History:  Procedure Laterality Date   ABDOMINAL HYSTERECTOMY     BIOPSY  09/13/2022   Procedure: BIOPSY;  Surgeon: Cindie Carlin POUR, DO;  [facility];  Service: Endoscopy;;   CHOLECYSTECTOMY     COLONOSCOPY WITH PROPOFOL  N/A 09/13/2022   Procedure: COLONOSCOPY WITH PROPOFOL ;  Surgeon: Cindie Carlin POUR, DO;  [facility];  Service:  Endoscopy;  Laterality: N/A;  11:15 am   COMPRESSION HIP SCREW Right 04/25/2019   Procedure: COMPRESSION HIP;  Surgeon: Margrette Taft BRAVO, MD;  Location: [facility];  Service: Orthopedics;  Laterality: Right;  1030am   ESOPHAGEAL BRUSHING  07/04/2022   Procedure: ESOPHAGEAL BRUSHING;  Surgeon: Shaaron Lamar HERO, MD;  Location[facility];  Service: Endoscopy;;   ESOPHAGOGASTRODUODENOSCOPY (EGD) WITH PROPOFOL  N/A 07/04/2022   Procedure: ESOPHAGOGASTRODUODENOSCOPY (EGD) WITH PROPOFOL ;  Surgeon: Shaaron Lamar HERO, MD;  [facility];  Service: Endoscopy;  Laterality: N/A;  ESOPHAGOGASTRODUODENOSCOPY WITH FOOD IMPACTION REMOVAL   ESOPHAGOGASTRODUODENOSCOPY (EGD) WITH PROPOFOL  N/A 09/13/2022   Procedure: ESOPHAGOGASTRODUODENOSCOPY (EGD) WITH PROPOFOL ;  Surgeon: Cindie Carlin POUR, DO;  [facility];  Service: Endoscopy;  Laterality: N/A;   EYE SURGERY Bilateral    cataract removal   MASTECTOMY, PARTIAL Right 07/20/2020   Procedure: MASTECTOMY PARTIAL;  Surgeon: Mavis Anes, MD;  Location: [facility];  Service: General;  Laterality: Right;   PARTIAL MASTECTOMY WITH AXILLARY SENTINEL LYMPH NODE BIOPSY Right 07/06/2020   Procedure: RIGHT PARTIAL MASTECTOMY WITH AXILLARY SENTINEL LYMPH NODE BIOPSY;  Surgeon: Mavis Anes, MD;  Location: [facility];  Service: General;  Laterality: Right;   POLYPECTOMY  09/13/2022   Procedure: POLYPECTOMY;  Surgeon: Cindie Carlin POUR, DO;  [facility];  Service: Endoscopy;;     A IV Location/Drains/Wounds Patient Lines/Drains/Airways Status  Active Line/Drains/Airways     Name Placement date Placement time Site Days   Peripheral IV 09/04/24 20 G 1 Right Antecubital 09/04/24  0833  Antecubital  2   Peripheral IV 09/06/24 20 G Right;Posterior Forearm 09/06/24  1436  Forearm  less than 1            Intake/Output Last 24 hours No intake or output data in the 24 hours ending 09/06/24 1715  Labs/Imaging Results for orders placed or performed  during the hospital encounter of 09/06/24 (from the past 48 hours)  Comprehensive metabolic panel     Status: Abnormal   Collection Time: 09/06/24  2:12 PM  Result Value Ref Range   Sodium 134 (L) 135 - 145 mmol/L   Potassium 3.4 (L) 3.5 - 5.1 mmol/L   Chloride 99 98 - 111 mmol/L   CO2 21 (L) 22 - 32 mmol/L   Glucose, Bld 152 (H) 70 - 99 mg/dL    Comment: Glucose reference range applies only to samples taken after fasting for at least 8 hours.   BUN 14 8 - 23 mg/dL   Creatinine, Ser 9.22 0.44 - 1.00 mg/dL   Calcium  8.1 (L) 8.9 - 10.3 mg/dL   Total Protein 6.3 (L) 6.5 - 8.1 g/dL   Albumin 3.6 3.5 - 5.0 g/dL   AST 23 15 - 41 U/L   ALT 21 0 - 44 U/L   Alkaline Phosphatase 114 38 - 126 U/L   Total Bilirubin 0.7 0.0 - 1.2 mg/dL   GFR, Estimated >39 >39 mL/min    Comment: (NOTE) Calculated using the CKD-EPI Creatinine Equation (2021)    Anion gap 13 5 - 15    Comment: Performed at [facility], [address] [place], [place] [address]  Troponin T, High Sensitivity     Status: None   Collection Time: 09/06/24  2:12 PM  Result Value Ref Range   Troponin T High Sensitivity <15 0 - 19 ng/L    Comment: (NOTE) Biotin concentrations > 1000 ng/mL falsely decrease TnT results.  Serial cardiac troponin measurements are suggested.  Refer to the Links section for chest pain algorithms and additional  guidance. Performed at [facility], [address], [place], [place] [address]   CBC with Differential     Status: Abnormal   Collection Time: 09/06/24  2:12 PM  Result Value Ref Range   WBC 14.1 (H) 4.0 - 10.5 K/uL   RBC 3.94 3.87 - 5.11 MIL/uL   Hemoglobin 12.9 12.0 - 15.0 g/dL   HCT 62.2 63.9 - 53.9 %   MCV 95.7 80.0 - 100.0 fL   MCH 32.7 26.0 - 34.0 pg   MCHC 34.2 30.0 - 36.0 g/dL   RDW 86.7 88.4 - 84.4 %   Platelets 208 150 - 400 K/uL   nRBC 0.0 0.0 - 0.2 %   Neutrophils Relative % 82 %   Neutro Abs 11.6 (H) 1.7 - 7.7 K/uL   Lymphocytes Relative 10 %   Lymphs Abs 1.4 0.7 - 4.0  K/uL   Monocytes Relative 4 %   Monocytes Absolute 0.6 0.1 - 1.0 K/uL   Eosinophils Relative 0 %   Eosinophils Absolute 0.0 0.0 - 0.5 K/uL   Basophils Relative 0 %   Basophils Absolute 0.0 0.0 - 0.1 K/uL   Smear Review Normal platelet morphology    Metamyelocytes Relative 1 %   Myelocytes 2 %   Promyelocytes Relative 1 %   Abs Immature Granulocytes 0.60 (H) 0.00 -  0.07 K/uL   Target Cells PRESENT     Comment: Performed at [facility], [address], [place], [place] [address]  Troponin T, High Sensitivity     Status: None   Collection Time: 09/06/24  3:04 PM  Result Value Ref Range   Troponin T High Sensitivity <15 0 - 19 ng/L    Comment: (NOTE) Biotin concentrations > 1000 ng/mL falsely decrease TnT results.  Serial cardiac troponin measurements are suggested.  Refer to the Links section for chest pain algorithms and additional  guidance. Performed at [facility], [address] [place], [place] [address]    CT PELVIS WO CONTRAST Result Date: 09/06/2024 EXAM: CT PELVIS, WITHOUT IV CONTRAST 09/06/2024 04:08:46 PM TECHNIQUE: Axial images were acquired through the pelvis without IV contrast. Reformatted images were reviewed. Automated exposure control, iterative reconstruction, and/or weight based adjustment of the mA/kV was utilized to reduce the radiation dose to as low as reasonably achievable. COMPARISON: None available. CLINICAL HISTORY: fall, diffuse pelvic pain FINDINGS: BONES: Acute minimally displaced fracture of the right superior pubic ramus extending into the pubic symphysis. Acute nondisplaced fracture of the base of the left superior pubic ramus/anterior acetabulum. Acute minimally displaced fracture of the left inferior pubic ramus. Fixation right femur. JOINTS: The right superior pubic ramus fracture extends into the pubic symphysis. The left superior pubic ramus fracture is at the anterior acetabulum. No dislocation. SOFT TISSUES: The soft tissues are unremarkable.  INTRAPELVIC CONTENTS: Limited images of the intrapelvic contents demonstrate no acute abnormality. IMPRESSION: 1. Acute minimally displaced fracture of the right superior pubic ramus extending into the pubic symphysis. 2. Acute nondisplaced fracture of the base of the left superior pubic ramus at the anterior acetabulum. 3. Acute minimally displaced fracture of the left inferior pubic ramus. Electronically signed by: Norman Gatlin MD 09/06/2024 04:19 PM EST RP Workstation: [id number]   DG HIP UNILAT WITH PELVIS 2-3 VIEWS LEFT Result Date: 09/06/2024 EXAM: 2 or 3 VIEW(S) XRAY OF THE LEFT HIP 09/06/2024 02:05:00 PM COMPARISON: CT abdomen/pelvis 09/04/2024 and hip radiographs 01/28/2024. CLINICAL HISTORY: Fall. Pain. FINDINGS: BONES AND JOINTS: Diffuse osseous demineralization. No acute fracture or dislocation of the left hip. 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 Mild osteoarthritis of the left hip. SOFT TISSUES: No acute abnormality. Peripheral vascular calcifications. IMPRESSION: 1. No acute fracture or dislocation of the left hip. 2. Chronic avulsion fracture of the left greater trochanter, better evaluated on prior CT. Electronically signed by: Harrietta Sherry MD 09/06/2024 02:44 PM EST RP Workstation: [id number]   DG HIP UNILAT WITH PELVIS 2-3 VIEWS RIGHT Result Date: 09/06/2024 EXAM: 2 or 3 VIEW(S) XRAY OF THE RIGHT HIP 09/06/2024 02:05:00 PM COMPARISON: CT abdomen/pelvis dated 09/04/2024 and hip radiographs dated 01/28/2024. CLINICAL HISTORY: Fall. Pain. FINDINGS: BONES AND JOINTS: Diffuse osseous demineralization. Evaluation of the sacrum is limited due to overlying bowel gas. No acute fracture or dislocation. Prior ORIF of the right proximal femur with unchanged alignment. Hardware is intact. Mild osteoarthritis of the right hip. Degenerative changes of the visualized lower lumbar spine. SOFT TISSUES: The soft tissues are  unremarkable. IMPRESSION: 1. No acute fracture or dislocation of the right hip. 2. Prior ORIF of the right proximal femur with unchanged alignment and intact hardware. 3. Diffuse osseous demineralization. 4. Degenerative changes of the visualized lower lumbar spine. Electronically signed by: Harrietta Sherry MD 09/06/2024 02:40 PM EST RP Workstation: [id number]   CT THORACIC SPINE WO CONTRAST Result Date: 09/06/2024 EXAM: CT  THORACIC SPINE WITHOUT CONTRAST 09/06/2024 02:07:00 PM TECHNIQUE: CT of the thoracic spine was performed without the administration of intravenous contrast. Multiplanar reformatted images are provided for review. Automated exposure control, iterative reconstruction, and/or weight based adjustment of the mA/kV was utilized to reduce the radiation dose to as low as reasonably achievable. COMPARISON: None available. CLINICAL HISTORY: FINDINGS: BONES AND ALIGNMENT: Normal vertebral body heights. No acute fracture or suspicious bone lesion. Normal alignment. DEGENERATIVE CHANGES: No severe degenerative changes. SOFT TISSUES: No acute abnormality. IMPRESSION: 1. No acute abnormality of the thoracic spine. Electronically signed by: Norman Gatlin MD 09/06/2024 02:37 PM EST RP Workstation: [id number]   DG Knee Complete 4 Views Right Result Date: 09/06/2024 EXAM: 4 VIEW(S) XRAY OF THE RIGHT KNEE 09/06/2024 02:05:00 PM COMPARISON: Right femur radiographs 10/14/2023. CLINICAL HISTORY: fall diffuse pain FINDINGS: BONES AND JOINTS: Diffuse osseous demineralization. No acute fracture or dislocation. No significant joint effusion. Mild tricompartmental osteoarthritis. SOFT TISSUES: Peripheral vascular calcifications. The soft tissues are otherwise unremarkable. IMPRESSION: 1. No acute osseous abnormality. 2. Mild tricompartmental osteoarthritis. Electronically signed by: Harrietta Sherry MD 09/06/2024 02:35 PM EST RP Workstation: [id number]   DG Chest Portable 1 View Result Date: 09/06/2024 EXAM: 1  VIEW(S) XRAY OF THE CHEST 09/06/2024 02:05:00 PM COMPARISON: 08/25/2024. CLINICAL HISTORY: fall diffuse pain FINDINGS: LUNGS AND PLEURA: No focal pulmonary opacity. No overt pulmonary edema. No pleural effusion. No pneumothorax. HEART AND MEDIASTINUM: Aortic atherosclerosis. Stable cardiomediastinal contours. BONES AND SOFT TISSUES: Right axillary surgical clips. No acute osseous abnormality. IMPRESSION: 1. No acute cardiopulmonary findings. Electronically signed by: Harrietta Sherry MD 09/06/2024 02:33 PM EST RP Workstation: [id number]   CT LUMBAR SPINE WO CONTRAST Result Date: 09/06/2024 EXAM: CT OF THE LUMBAR SPINE WITHOUT CONTRAST 09/06/2024 01:53:14 PM TECHNIQUE: CT of the lumbar spine was performed without the administration of intravenous contrast. Multiplanar reformatted images are provided for review. Automated exposure control, iterative reconstruction, and/or weight based adjustment of the mA/kV was utilized to reduce the radiation dose to as low as reasonably achievable. COMPARISON: MRI lumbar spine 12/28/2022. CLINICAL HISTORY: FINDINGS: BONES AND ALIGNMENT: Normal vertebral body heights. Grade 1 anterolisthesis of L4 on L5 is similar to prior. Lumbar lordosis is maintained. Diffuse osteopenia. There is asymmetric prominence of osteopenia within the sacral ala slightly greater on the right. No acute fracture or suspicious bone lesion. DEGENERATIVE CHANGES: Degenerative changes of the bilateral sacroiliac joints. Degenerative endplate osteophytes of multiple levels. Mild disc space narrowing at L5-S1. There are small disc bulges at multiple levels. Facet arthrosis throughout the lumbar spine. There is mild bilateral foraminal stenosis at L4-L5. Moderate foraminal stenosis bilaterally at L5-S1. No high grade osseous spinal canal stenosis. SOFT TISSUES: The visualized paraspinal soft tissues are unremarkable. There is extensive atherosclerosis of the abdominal aorta and branch vessels. Sequelae of  cholecystectomy. IMPRESSION: 1. No acute findings. 2. Grade 1 anterolisthesis of L4 on L5, similar to prior study. 3. Degenerative changes as above. 4. Diffuse osteopenia with asymmetric prominence within the sacral ala, slightly greater on the right. Electronically signed by: Donnice Mania MD 09/06/2024 02:24 PM EST RP Workstation: [id number]   CT Cervical Spine Wo Contrast Result Date: 09/06/2024 EXAM: CT CERVICAL SPINE WITHOUT CONTRAST 09/06/2024 01:53:32 PM TECHNIQUE: CT of the cervical spine was performed without the administration of intravenous contrast. Multiplanar reformatted images are provided for review. Automated exposure control, iterative reconstruction, and/or weight based adjustment of the mA/kV was utilized to reduce the radiation dose to as low as reasonably achievable. COMPARISON: 01/09/2024 CLINICAL HISTORY: Ataxia, cervical trauma.  FINDINGS: CERVICAL SPINE: BONES AND ALIGNMENT: Similar trace degenerative retrolisthesis of C3 on C4. Alignment is otherwise maintained. No evidence of traumatic malalignment. No acute fracture. DEGENERATIVE CHANGES: Disc space narrowing most pronounced posteriorly at C6-C7. Disc osteophyte complexes at multiple levels without evidence of high grade osseous spinal canal stenosis. Facet arthrosis and uncovertebral hypertrophy at multiple levels. Foraminal stenosis greatest at C3-C4 and C6-C7. SOFT TISSUES: No prevertebral soft tissue swelling. Atherosclerosis at the carotid bifurcations. INCIDENTAL LUNG FINDINGS: Emphysema in the lung apices. IMPRESSION: 1. No acute abnormality of the cervical spine. 2. Degenerative changes as above. 3. Incidental emphysema in the lung apices; for patients aged 74, consider evaluation for a low-dose CT lung cancer screening program. Electronically signed by: Donnice Mania MD 09/06/2024 02:17 PM EST RP Workstation: [id number]   CT Head Wo Contrast Result Date: 09/06/2024 EXAM: CT HEAD WITHOUT CONTRAST 09/06/2024 01:53:32 PM  TECHNIQUE: CT of the head was performed without the administration of intravenous contrast. Automated exposure control, iterative reconstruction, and/or weight based adjustment of the mA/kV was utilized to reduce the radiation dose to as low as reasonably achievable. COMPARISON: 03/09/2024 CLINICAL HISTORY: Head trauma, minor (Age >= 65y). FINDINGS: BRAIN AND VENTRICLES: No acute hemorrhage. No evidence of acute infarct. Proportional prominence of the ventricles and sulci, consistent with diffuse cerebral parenchymal volume loss. Scattered periventricular white matter hypoattenuation, consistent with mild chronic ischemic microvascular disease. No hydrocephalus. No extra-axial collection. No mass effect or midline shift. Calcified atherosclerotic plaque within cavernous/supraclinoid ICA and intradural vertebral arteries. ORBITS: Bilateral lens replacement noted. SINUSES: No acute abnormality. SOFT TISSUES AND SKULL: No acute soft tissue abnormality. No skull fracture. IMPRESSION: 1. No acute intracranial abnormality. 2. Mild cerebral parenchymal volume loss and mild chronic ischemic microvascular disease. Electronically signed by: Donnice Mania MD 09/06/2024 02:12 PM EST RP Workstation: [id number]    Pending Labs Unresulted Labs (From admission, onward)     Start     Ordered   09/06/24 1309  Urinalysis, Routine w reflex microscopic -Urine, Clean Catch  Once,   URGENT       Question:  Specimen Source  Answer:  Urine, Clean Catch   09/06/24 1309            Vitals/Pain Today's Vitals   09/06/24 1251 09/06/24 1257  BP:  (!) 142/44  Pulse:  74  Resp:  18  Temp:  99.8 F (37.7 C)  TempSrc:  Oral  SpO2: 99%   Weight: 50.3 kg   Height: 5' 3 (1.6 m)   PainSc: 10-Worst pain ever     Isolation Precautions No active isolations  Medications Medications  morphine  (PF) 4 MG/ML injection 4 mg (4 mg Intravenous Given 09/06/24 1442)  ondansetron  (ZOFRAN ) injection 4 mg (4 mg Intravenous Given  09/06/24 1441)  potassium chloride  SA (KLOR-CON  M) CR tablet 40 mEq (40 mEq Oral Given 09/06/24 1712)  sodium chloride  0.9 % bolus 500 mL (500 mLs Intravenous Bolus from Bag 09/06/24 1714)    Mobility non-ambulatory     Focused Assessments    R Recommendations: See Admitting Provider Note  Report given to:   Additional Notes:

## 2024-09-06 NOTE — ED Triage Notes (Signed)
 Arrived from home with [facility]. Fall around 11am. Bilateral leg pain 15-10. Pt can't remember fall. V/S stable with EMS. Pt ambulatory at baseline. Aox4.

## 2024-09-06 NOTE — ED Notes (Signed)
 Patient transported to X-ray

## 2024-09-06 NOTE — ED Provider Notes (Signed)
 [name] EMERGENCY DE[facility]MENT AT [facility] Provider Note   CSN: [id number] Arrival date & time: 09/06/24  1238     Patient presents with: [name]   [name] is a 73 y.o. female.   Patient is a 73 year old female who presents to the emergency department with a chief complaint of fall from bed early this morning.  Patient notes that she is currently experiencing pain over her back, bilateral hips and lower extremities.  Patient is unsure if she struck her head during the fall but denies any loss of consciousness.  Patient notes that she was unable to stand after the fall.  She denies any active dizziness or lightheadedness.  She denies any chest pain or abdominal pain at this time.  There is no associated numbness, paresthesias or unilateral weakness.  She notes that the fall was mechanical in nature.   Fall       Prior to Admission medications   Medication Sig Start Date End Date Taking? Authorizing Provider  acetaminophen  (TYLENOL ) 500 MG tablet Take 500 mg by mouth every 6 (six) hours as needed for mild pain (pain score 1-3).    [provider]  albuterol  (PROVENTIL ) (2.5 MG/3ML) 0.083% nebulizer solution Take 3 mLs (2.5 mg total) by nebulization every 4 (four) hours as needed for wheezing or shortness of breath. 06/13/24   Johnson, Clanford L, MD  albuterol  (VENTOLIN  HFA) 108 (90 Base) MCG/ACT inhaler Inhale 2 puffs into the lungs every 6 (six) hours as needed for wheezing or shortness of breath. 06/13/24   Johnson, Clanford L, MD  Aromatic Inhalants (VICKS VAPOINHALER IN) Inhale 1 puff into the lungs daily as needed (congestion).    [provider]  atorvastatin  (LIPITOR) 20 MG tablet Take 1 tablet (20 mg total) by mouth every evening. 06/24/24   Bevely Doffing, FNP  budeson-glycopyrrolate -formoterol  (BREZTRI  AEROSPHERE) 160-9-4.8 MCG/ACT AERO inhaler Inhale 2 puffs into the lungs 2 (two) times daily.    [provider]   carboxymethylcellulose (REFRESH PLUS) 0.5 % SOLN Place 1 drop into both eyes 3 (three) times daily as needed (dry/irritated eyes.).    [provider]  clonazePAM  (KLONOPIN ) 0.5 MG tablet Take 1 tablet (0.5 mg total) by mouth 2 (two) times daily as needed for anxiety. 08/15/24   Suzette Pac, MD  dicyclomine  (BENTYL ) 10 MG capsule Take 10 mg by mouth 2 (two) times daily.    [provider]  doxycycline  (VIBRAMYCIN ) 100 MG capsule Take 1 capsule (100 mg total) by mouth 2 (two) times daily. 08/25/24   Daralene Lonni BIRCH, PA-C  DULoxetine  (CYMBALTA ) 20 MG capsule Take 20 mg by mouth daily. 02/04/24   [provider]  gabapentin  (NEURONTIN ) 300 MG capsule TAKE (1) CAPSULE BY MOUTH THREE TIMES DAILY 02/06/23   Rogers Hai, MD  hydrOXYzine (ATARAX) 25 MG tablet Take 25 mg by mouth 2 (two) times daily. 09/10/22   [provider]  lidocaine  (LIDODERM ) 5 % Place 1 patch onto the skin daily as needed. Remove & Discard patch within 12 hours or as directed by MD 09/10/23   Elnor Savant A, DO  lidocaine  (LIDODERM ) 5 % Place 1 patch onto the skin daily. Remove & Discard patch within 12 hours or as directed by MD 09/04/24   Kammerer, Megan L, DO  Multiple Vitamin (MULTIVITAMIN WITH MINERALS) TABS tablet Take 1 tablet by mouth 3 (three) times a week.    [provider]  omeprazole  (PRILOSEC) 40 MG capsule Take 1 capsule (40 mg  total) by mouth daily. 08/12/24   Bevely Doffing, FNP  ondansetron  (ZOFRAN ) 4 MG tablet Take 4-8 mg by mouth every 6 (six) hours. 11/18/23   [provider]  phenytoin  (DILANTIN ) 100 MG ER capsule Take 400 mg by mouth at bedtime.    [provider]  tamoxifen  (NOLVADEX ) 20 MG tablet TAKE ONE TABLET BY MOUTH ONCE DAILY. 11/15/22   Katragadda, Sreedhar, MD  tiZANidine (ZANAFLEX) 2 MG tablet Take 4 mg by mouth every 8 (eight) hours. 02/17/24   [provider]  valACYclovir (VALTREX) 1000 MG tablet Take 1 tablet (1,000  mg total) by mouth 3 (three) times daily for 7 days. 09/04/24 09/11/24  Kammerer, Duwaine L, DO  zolpidem  (AMBIEN ) 5 MG tablet Take 5 mg by mouth at bedtime as needed for sleep. 11/30/23   [provider]    Allergies: Varenicline, Codeine, Haemophilus b polysaccharide vaccine, Influenza virus vaccine, and Lithium     Review of Systems  Musculoskeletal:  Positive for back pain.       Bilateral hip pain  All other systems reviewed and are negative.   Updated Vital Signs BP (!) 142/44 (BP Location: Right Arm)   Pulse 74   Temp 99.8 F (37.7 C) (Oral)   Resp 18   Ht 5' 3 (1.6 m)   Wt 50.3 kg   SpO2 99%   BMI 19.64 kg/m   Physical Exam Vitals and nursing note reviewed.  Constitutional:      General: She is not in acute distress.    Appearance: Normal appearance. She is not ill-appearing.  HENT:     Head: Normocephalic and atraumatic.     Nose: Nose normal.     Mouth/Throat:     Mouth: Mucous membranes are moist.  Eyes:     Extraocular Movements: Extraocular movements intact.     Conjunctiva/sclera: Conjunctivae normal.     Pupils: Pupils are equal, round, and reactive to light.  Neck:     Comments: Mild midline tenderness, no step-off or deformity Cardiovascular:     Rate and Rhythm: Normal rate and regular rhythm.     Pulses: Normal pulses.     Heart sounds: Normal heart sounds. No murmur heard.    No gallop.  Pulmonary:     Effort: Pulmonary effort is normal. No respiratory distress.     Breath sounds: Normal breath sounds. No stridor. No wheezing, rhonchi or rales.  Chest:     Chest wall: No tenderness.  Abdominal:     General: Abdomen is flat. Bowel sounds are normal. There is no distension.     Palpations: Abdomen is soft.     Tenderness: There is no abdominal tenderness. There is no guarding.  Musculoskeletal:        General: Normal range of motion.     Cervical back: Normal range of motion and neck supple. No rigidity.     Comments: Tender to palpation  over bilateral hips, pelvis stable to AP lower compression, mild tenderness palpation of her right knee, nontender palpation remainder bilateral lower extremities, DP and PT pulses are 2+ distally, sensation intact distally, limited active and passive range of motion of bilateral hips secondary to pain, nontender palpation of the bilateral upper extremities, radial pulse 2+ distally bilateral upper extremities, no obvious deformity, tenderness palpation noted over thoracic and lumbar spine, no step-off  Skin:    General: Skin is warm and dry.     Findings: No rash.     Comments: Scattered bruising along bilateral  upper and lower extremities  Neurological:     General: No focal deficit present.     Mental Status: She is alert and oriented to person, place, and time. Mental status is at baseline.     Cranial Nerves: No cranial nerve deficit.     Sensory: No sensory deficit.     Motor: No weakness.     Coordination: Coordination normal.  Psychiatric:        Mood and Affect: Mood normal.        Behavior: Behavior normal.        Thought Content: Thought content normal.        Judgment: Judgment normal.     (all labs ordered are listed, but only abnormal results are displayed) Labs Reviewed  COMPREHENSIVE METABOLIC PANEL WITH GFR  CBC WITH DIFFERENTIAL/PLATELET  URINALYSIS, ROUTINE W REFLEX MICROSCOPIC  TROPONIN T, HIGH SENSITIVITY    EKG: None  Radiology: No results found.   Procedures   Medications Ordered in the ED  morphine  (PF) 4 MG/ML injection 4 mg (has no administration in time range)  ondansetron  (ZOFRAN ) injection 4 mg (has no administration in time range)                                    Medical Decision Making Amount and/or Complexity of Data Reviewed Labs: ordered. Radiology: ordered.  Risk Prescription drug management. Decision regarding hospitalization.   This patient presents to the ED for concern of fall, back pain, bilateral hip pain, this involves  an extensive number of treatment options, and is a complaint that carries with it a high risk of complications and morbidity.  The differential diagnosis includes long bone or joint fracture, pelvic fracture, vertebral fracture, retroperitoneal hemorrhage, rib fracture, intracranial hemorrhage   Co morbidities that complicate the patient evaluation  COPD, bipolar disorder, seizures, CVA   Additional history obtained:  Additional history obtained from medical records External records from outside source obtained and reviewed including medical records   Lab Tests:  I Ordered, and personally interpreted labs.  The pertinent results include: Mild leukocytosis, no anemia, mild hyponatremia and hypokalemia, normal creatinine and liver function, negative troponin   Imaging Studies ordered:  I ordered imaging studies including CT scan head, cervical spine, thoracic spine, lumbar spine, pelvis, x-ray of bilateral hips and right knee I independently visualized and interpreted imaging which showed no acute intracranial hemorrhage, no fracture of the cervical, thoracic, lumbar spine, bilateral pubic rami fractures, no acute osseous injury of the right knee I agree with the radiologist interpretation   Cardiac Monitoring: / EKG:  The patient was maintained on a cardiac monitor.  I personally viewed and interpreted the cardiac monitored which showed an underlying rhythm of: Normal sinus rhythm, no ST/T wave changes, no ischemic changes, no STEMI   Consultations Obtained:  I requested consultation with the orthopedics, Dr. Onesimo,  and discussed lab and imaging findings as well as pertinent plan - they recommend: Nonweightbearing, no surgical intervention warranted   Problem List / ED Course / Critical interventions / Medication management  Patient is doing well at this time and does remain stable.  Discussed with patient that she does have multiple pelvic fractures.  Remaining images were  unremarkable.  She had no tenderness over bilateral upper extremities and no tenderness over remaining areas that were not imaged on the lower extremities.  Patient was nontender to palpation of her chest wall  and abdomen.  She had no CVA tenderness.  Patient is not safe for discharge home at this time and have discussed patient case with Dr. E Emokpae with the hospitalist service who has excepted for admission. I ordered medication including morphine , Zofran , potassium, IV fluids for acute traumatic injury, hypokalemia, hyponatremia Reevaluation of the patient after these medicines showed that the patient improved I have reviewed the patients home medicines and have made adjustments as needed   Social Determinants of Health:  None   Test / Admission - Considered:  Admission     Final diagnoses:  None    ED Discharge Orders     None          Daralene Lonni BIRCH, PA-C 09/06/24 1655    Kammerer, Megan L, DO 09/09/24 [phone]

## 2024-09-06 NOTE — ED Notes (Signed)
 Patient transported to CT

## 2024-09-06 NOTE — ED Notes (Signed)
 Wound care provided for skin tear on right posterior distal forearm.

## 2024-09-06 NOTE — H&P (Signed)
 History and Physical    [name] FMW:[id number] DOB: 1951/06/16 DOA: 09/06/2024  PCP: Bevely Doffing, FNP   Patient coming from: Home  I have personally briefly reviewed patient's old medical records in [facility]  Chief Complaint: Falls  HPI: [name] is a 73 y.o. female with medical history significant for COPD, bipolar disorder, seizure disorder, chronic pain, alcohol abuse, stroke. Patient presented to the ED with reports of a fall today, at about 11 AM, patient fell from bed.  She has had several falls.  She ambulates with walker or cane.  She has bruises to her lower extremities which she attributes to her dog.  She reports good oral intake, no vomiting no diarrhea no dizziness.  She reports pain to her back, hips, legs, and that she is unable to ambulate.  ED Course: Stable vitals.  Potassium 3.4.  Troponin less than 15 x 2.  WBC 14.1. - CT lumbar spine without contrast-no acute abnormality, shows grade 1 anterolisthesis of L4 on L5 similar to prior - Head CT, cervical CT, thoracic CT negative for acute abnormality - Port chest x-ray, right knee x-ray, pelvic x-ray-negative for acute abnormality. - CT pelvis without contrast-acute minimally displaced fracture of the right superior pubic rami extending to the pubic symphysis, acute nondisplaced fracture of the base of the left superior pubic rami at the anterior acetabulum, acute minimally displaced fracture of the left inferior pubic rami. EDP talked to orthopedist on-call Dr. Delbra fractures, recommend none-weightbearing. Patient unable to ambulate with persistent pain.  Hospitalization requested patient may need rehab.  Review of Systems: As per HPI all other systems reviewed and negative.  Past Medical History:  Diagnosis Date   Alcohol abuse, in remission    Anxiety    Arthritis    Bipolar disorder (HCC)    Colitis    COPD (chronic obstructive pulmonary disease) (HCC)    occasional home O2    Gastric ulcer    GERD (gastroesophageal reflux disease)    Hemorrhoids    IBS (irritable bowel syndrome)    Seizures (HCC)    last seizure was about a year ago   Stroke [facility])    right sided weakness-slight    Past Surgical History:  Procedure Laterality Date   ABDOMINAL HYSTERECTOMY     BIOPSY  09/13/2022   Procedure: BIOPSY;  Surgeon: Cindie Carlin POUR, DO;  Location: [facility];  Service: Endoscopy;;   CHOLECYSTECTOMY     COLONOSCOPY WITH PROPOFOL  N/A 09/13/2022   Procedure: COLONOSCOPY WITH PROPOFOL ;  Surgeon: Cindie Carlin POUR, DO;  Location: [facility];  Service: Endoscopy;  Laterality: N/A;  11:15 am   COMPRESSION HIP SCREW Right 04/25/2019   Procedure: COMPRESSION HIP;  Surgeon: Margrette Taft BRAVO, MD;  Location: [facility] OR[facility];  Service: Orthopedics;  Laterality: Right;  1030am   ESOPHAGEAL BRUSHING  07/04/2022   Procedure: ESOPHAGEAL BRUSHING;  Surgeon: Shaaron Lamar HERO, MD;  Location: [facility];  Service: Endoscopy;;   ESOPHAGOGASTRODUODENOSCOPY (EGD) WITH PROPOFOL  N/A 07/04/2022   Procedure: ESOPHAGOGASTRODUODENOSCOPY (EGD) WITH PROPOFOL ;  Surgeon: Shaaron Lamar HERO, MD;  Location: [facility];  Service: Endoscopy;  Laterality: N/A;  ESOPHAGOGASTRODUODENOSCOPY WITH FOOD IMPACTION REMOVAL   ESOPHAGOGASTRODUODENOSCOPY (EGD) WITH PROPOFOL  N/A 09/13/2022   Procedure: ESOPHAGOGASTRODUODENOSCOPY (EGD) WITH PROPOFOL ;  Surgeon: Cindie Carlin POUR, DO;  [facility]: [facility];  Service: Endoscopy;  Laterality: N/A;   EYE SURGERY Bilateral    cataract removal   MASTECTOMY, PARTIAL Right 07/20/2020   Procedure: MASTECTOMY  PARTIAL;  Surgeon: Mavis Anes, MD;  Location: [facility];  Service: General;  Laterality: Right;   PARTIAL MASTECTOMY WITH AXILLARY SENTINEL LYMPH NODE BIOPSY Right 07/06/2020   Procedure: RIGHT PARTIAL MASTECTOMY WITH AXILLARY SENTINEL LYMPH NODE BIOPSY;  Surgeon: Mavis Anes, MD;  Location: [facility];  Service: General;  Laterality: Right;   POLYPECTOMY  09/13/2022    Procedure: POLYPECTOMY;  Surgeon: Cindie Carlin POUR, DO;  [facility];  Service: Endoscopy;;     reports that she has been smoking cigarettes. She has a 16 pack-year smoking history. She has never used smokeless tobacco. She reports current drug use. Drug: Marijuana. She reports that she does not drink alcohol.  Allergies  Allergen Reactions   Varenicline Hives   Codeine Nausea And Vomiting   Haemophilus B Polysaccharide Vaccine Nausea And Vomiting   Influenza Virus Vaccine Nausea And Vomiting   Lithium  Nausea And Vomiting    Family History  Problem Relation Age of Onset   Dementia Father     Prior to Admission medications   Medication Sig Start Date End Date Taking? Authorizing Provider  acetaminophen  (TYLENOL ) 500 MG tablet Take 500 mg by mouth every 6 (six) hours as needed for mild pain (pain score 1-3).    [provider]  albuterol  (PROVENTIL ) (2.5 MG/3ML) 0.083% nebulizer solution Take 3 mLs (2.5 mg total) by nebulization every 4 (four) hours as needed for wheezing or shortness of breath. 06/13/24   Johnson, Clanford L, MD  albuterol  (VENTOLIN  HFA) 108 (90 Base) MCG/ACT inhaler Inhale 2 puffs into the lungs every 6 (six) hours as needed for wheezing or shortness of breath. 06/13/24   Johnson, Clanford L, MD  Aromatic Inhalants (VICKS VAPOINHALER IN) Inhale 1 puff into the lungs daily as needed (congestion).    [provider]  atorvastatin  (LIPITOR) 20 MG tablet Take 1 tablet (20 mg total) by mouth every evening. 06/24/24   Bevely Doffing, FNP  budeson-glycopyrrolate -formoterol  (BREZTRI  AEROSPHERE) 160-9-4.8 MCG/ACT AERO inhaler Inhale 2 puffs into the lungs 2 (two) times daily.    [provider]  carboxymethylcellulose (REFRESH PLUS) 0.5 % SOLN Place 1 drop into both eyes 3 (three) times daily as needed (dry/irritated eyes.).    [provider]  clonazePAM  (KLONOPIN ) 0.5 MG tablet Take 1 tablet (0.5 mg total) by mouth 2 (two) times  daily as needed for anxiety. 08/15/24   Suzette Pac, MD  dicyclomine  (BENTYL ) 10 MG capsule Take 10 mg by mouth 2 (two) times daily.    [provider]  doxycycline  (VIBRAMYCIN ) 100 MG capsule Take 1 capsule (100 mg total) by mouth 2 (two) times daily. 08/25/24   Daralene Bruckner D, PA-C  DULoxetine  (CYMBALTA ) 20 MG capsule Take 20 mg by mouth daily. 02/04/24   [provider]  gabapentin  (NEURONTIN ) 300 MG capsule TAKE (1) CAPSULE BY MOUTH THREE TIMES DAILY 02/06/23   Rogers Hai, MD  hydrOXYzine (ATARAX) 25 MG tablet Take 25 mg by mouth 2 (two) times daily. 09/10/22   [provider]  lidocaine  (LIDODERM ) 5 % Place 1 patch onto the skin daily as needed. Remove & Discard patch within 12 hours or as directed by MD 09/10/23   Elnor Savant A, DO  lidocaine  (LIDODERM ) 5 % Place 1 patch onto the skin daily. Remove & Discard patch within 12 hours or as directed by MD 09/04/24   Kammerer, Megan L, DO  Multiple Vitamin (MULTIVITAMIN WITH MINERALS) TABS tablet Take 1 tablet by mouth 3 (three) times a week.  [provider]  omeprazole  (PRILOSEC) 40 MG capsule Take 1 capsule (40 mg total) by mouth daily. 08/12/24   Bevely Doffing, FNP  ondansetron  (ZOFRAN ) 4 MG tablet Take 4-8 mg by mouth every 6 (six) hours. 11/18/23   [provider]  phenytoin  (DILANTIN ) 100 MG ER capsule Take 400 mg by mouth at bedtime.    [provider]  tamoxifen  (NOLVADEX ) 20 MG tablet TAKE ONE TABLET BY MOUTH ONCE DAILY. 11/15/22   Katragadda, Sreedhar, MD  tiZANidine (ZANAFLEX) 2 MG tablet Take 4 mg by mouth every 8 (eight) hours. 02/17/24   [provider]  valACYclovir (VALTREX) 1000 MG tablet Take 1 tablet (1,000 mg total) by mouth 3 (three) times daily for 7 days. 09/04/24 09/11/24  Kammerer, Duwaine L, DO  zolpidem  (AMBIEN ) 5 MG tablet Take 5 mg by mouth at bedtime as needed for sleep. 11/30/23   [provider]    Physical Exam: Vitals:   09/06/24  1251 09/06/24 1257 09/06/24 1806  BP:  (!) 142/44 137/68  Pulse:  74 82  Resp:  18 18  Temp:  99.8 F (37.7 C) 99.5 F (37.5 C)  TempSrc:  Oral Oral  SpO2: 99%  97%  Weight: 50.3 kg    Height: 5' 3 (1.6 m)  5' 3.5 (1.613 m)    Constitutional: NAD, calm, comfortable Vitals:   09/06/24 1251 09/06/24 1257 09/06/24 1806  BP:  (!) 142/44 137/68  Pulse:  74 82  Resp:  18 18  Temp:  99.8 F (37.7 C) 99.5 F (37.5 C)  TempSrc:  Oral Oral  SpO2: 99%  97%  Weight: 50.3 kg    Height: 5' 3 (1.6 m)  5' 3.5 (1.613 m)   Eyes: PERRL, lids and conjunctivae normal ENMT: Mucous membranes are moist.   Neck: normal, supple, no masses, no thyromegaly Respiratory: clear to auscultation bilaterally, no wheezing, no crackles. Normal respiratory effort. No accessory muscle use.  Cardiovascular: Regular rate and rhythm, no murmurs / rubs / gallops. No extremity edema.  Extremities warm. Abdomen: no tenderness, no masses palpated. No hepatosplenomegaly.  Musculoskeletal: no clubbing / cyanosis. No joint deformity upper and lower extremities.  Skin: Bruising to bilateral shins , no rashes, lesions, ulcers. No induration Neurologic: Movement of lower extremities limited by pain, good grip strength bilaterally, speech fluent but sometimes hard to understand. Psychiatric: Normal judgment and insight. Alert and oriented x 3. Normal mood.   Labs on Admission: I have personally reviewed following labs and imaging studies  CBC: Recent Labs  Lab 09/03/24 1145 09/04/24 0654 09/06/24 1412  WBC 8.5 6.6 14.1*  NEUTROABS 4.8 4.0 11.6*  HGB 14.3 13.3 12.9  HCT 42.2 39.4 37.7  MCV 96.3 97.3 95.7  PLT 281 258 208   Basic Metabolic Panel: Recent Labs  Lab 09/03/24 1145 09/04/24 0654 09/06/24 1412  NA 137 137 134*  K 3.9 5.0 3.4*  CL 101 103 99  CO2 21* 25 21*  GLUCOSE 105* 113* 152*  BUN 17 19 14   CREATININE 0.96 1.01* 0.77  CALCIUM  8.3* 8.4* 8.1*   GFR: Estimated Creatinine Clearance: 49  mL/min (by C-G formula based on SCr of 0.77 mg/dL). Liver Function Tests: Recent Labs  Lab 09/03/24 1145 09/04/24 0654 09/06/24 1412  AST 17 15 23   ALT 12 11 21   ALKPHOS 111 106 114  BILITOT 0.4 0.5 0.7  PROT 6.6 6.1* 6.3*  ALBUMIN 3.9 3.7 3.6   Recent Labs  Lab 09/03/24 1145 09/04/24 0654  LIPASE  20 16   BNP (last 3 results) Recent Labs    08/25/24 1025  PROBNP 142.0    Radiological Exams on Admission: CT PELVIS WO CONTRAST Result Date: 09/06/2024 EXAM: CT PELVIS, WITHOUT IV CONTRAST 09/06/2024 04:08:46 PM TECHNIQUE: Axial images were acquired through the pelvis without IV contrast. Reformatted images were reviewed. Automated exposure control, iterative reconstruction, and/or weight based adjustment of the mA/kV was utilized to reduce the radiation dose to as low as reasonably achievable. COMPARISON: None available. CLINICAL HISTORY: fall, diffuse pelvic pain FINDINGS: BONES: Acute minimally displaced fracture of the right superior pubic ramus extending into the pubic symphysis. Acute nondisplaced fracture of the base of the left superior pubic ramus/anterior acetabulum. Acute minimally displaced fracture of the left inferior pubic ramus. Fixation right femur. JOINTS: The right superior pubic ramus fracture extends into the pubic symphysis. The left superior pubic ramus fracture is at the anterior acetabulum. No dislocation. SOFT TISSUES: The soft tissues are unremarkable. INTRAPELVIC CONTENTS: Limited images of the intrapelvic contents demonstrate no acute abnormality. IMPRESSION: 1. Acute minimally displaced fracture of the right superior pubic ramus extending into the pubic symphysis. 2. Acute nondisplaced fracture of the base of the left superior pubic ramus at the anterior acetabulum. 3. Acute minimally displaced fracture of the left inferior pubic ramus. Electronically signed by: Norman Gatlin MD 09/06/2024 04:19 PM EST RP Workstation: [id number]   DG HIP UNILAT WITH PELVIS 2-3  VIEWS LEFT Result Date: 09/06/2024 EXAM: 2 or 3 VIEW(S) XRAY OF THE LEFT HIP 09/06/2024 02:05:00 PM COMPARISON: CT abdomen/pelvis 09/04/2024 and hip radiographs 01/28/2024. CLINICAL HISTORY: Fall. Pain. FINDINGS: BONES AND JOINTS: Diffuse osseous demineralization. No acute fracture or dislocation of the left hip. 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 Mild osteoarthritis of the left hip. SOFT TISSUES: No acute abnormality. Peripheral vascular calcifications. IMPRESSION: 1. No acute fracture or dislocation of the left hip. 2. Chronic avulsion fracture of the left greater trochanter, better evaluated on prior CT. Electronically signed by: Harrietta Sherry MD 09/06/2024 02:44 PM EST RP Workstation: [id number]   DG HIP UNILAT WITH PELVIS 2-3 VIEWS RIGHT Result Date: 09/06/2024 EXAM: 2 or 3 VIEW(S) XRAY OF THE RIGHT HIP 09/06/2024 02:05:00 PM COMPARISON: CT abdomen/pelvis dated 09/04/2024 and hip radiographs dated 01/28/2024. CLINICAL HISTORY: Fall. Pain. FINDINGS: BONES AND JOINTS: Diffuse osseous demineralization. Evaluation of the sacrum is limited due to overlying bowel gas. No acute fracture or dislocation. Prior ORIF of the right proximal femur with unchanged alignment. Hardware is intact. Mild osteoarthritis of the right hip. Degenerative changes of the visualized lower lumbar spine. SOFT TISSUES: The soft tissues are unremarkable. IMPRESSION: 1. No acute fracture or dislocation of the right hip. 2. Prior ORIF of the right proximal femur with unchanged alignment and intact hardware. 3. Diffuse osseous demineralization. 4. Degenerative changes of the visualized lower lumbar spine. Electronically signed by: Harrietta Sherry MD 09/06/2024 02:40 PM EST RP Workstation: [id number]   CT THORACIC SPINE WO CONTRAST Result Date: 09/06/2024 EXAM: CT THORACIC SPINE WITHOUT CONTRAST 09/06/2024 02:07:00 PM TECHNIQUE: CT of the thoracic spine was performed  without the administration of intravenous contrast. Multiplanar reformatted images are provided for review. Automated exposure control, iterative reconstruction, and/or weight based adjustment of the mA/kV was utilized to reduce the radiation dose to as low as reasonably achievable. COMPARISON: None available. CLINICAL HISTORY: FINDINGS: BONES AND ALIGNMENT: Normal vertebral body heights. No acute fracture or suspicious bone lesion. Normal alignment. DEGENERATIVE CHANGES: No severe degenerative changes. SOFT  TISSUES: No acute abnormality. IMPRESSION: 1. No acute abnormality of the thoracic spine. Electronically signed by: Norman Gatlin MD 09/06/2024 02:37 PM EST RP Workstation: [id number]   DG Knee Complete 4 Views Right Result Date: 09/06/2024 EXAM: 4 VIEW(S) XRAY OF THE RIGHT KNEE 09/06/2024 02:05:00 PM COMPARISON: Right femur radiographs 10/14/2023. CLINICAL HISTORY: fall diffuse pain FINDINGS: BONES AND JOINTS: Diffuse osseous demineralization. No acute fracture or dislocation. No significant joint effusion. Mild tricompartmental osteoarthritis. SOFT TISSUES: Peripheral vascular calcifications. The soft tissues are otherwise unremarkable. IMPRESSION: 1. No acute osseous abnormality. 2. Mild tricompartmental osteoarthritis. Electronically signed by: Harrietta Sherry MD 09/06/2024 02:35 PM EST RP Workstation: [id number]   DG Chest Portable 1 View Result Date: 09/06/2024 EXAM: 1 VIEW(S) XRAY OF THE CHEST 09/06/2024 02:05:00 PM COMPARISON: 08/25/2024. CLINICAL HISTORY: fall diffuse pain FINDINGS: LUNGS AND PLEURA: No focal pulmonary opacity. No overt pulmonary edema. No pleural effusion. No pneumothorax. HEART AND MEDIASTINUM: Aortic atherosclerosis. Stable cardiomediastinal contours. BONES AND SOFT TISSUES: Right axillary surgical clips. No acute osseous abnormality. IMPRESSION: 1. No acute cardiopulmonary findings. Electronically signed by: Harrietta Sherry MD 09/06/2024 02:33 PM EST RP Workstation:  [id number]   CT LUMBAR SPINE WO CONTRAST Result Date: 09/06/2024 EXAM: CT OF THE LUMBAR SPINE WITHOUT CONTRAST 09/06/2024 01:53:14 PM TECHNIQUE: CT of the lumbar spine was performed without the administration of intravenous contrast. Multiplanar reformatted images are provided for review. Automated exposure control, iterative reconstruction, and/or weight based adjustment of the mA/kV was utilized to reduce the radiation dose to as low as reasonably achievable. COMPARISON: MRI lumbar spine 12/28/2022. CLINICAL HISTORY: FINDINGS: BONES AND ALIGNMENT: Normal vertebral body heights. Grade 1 anterolisthesis of L4 on L5 is similar to prior. Lumbar lordosis is maintained. Diffuse osteopenia. There is asymmetric prominence of osteopenia within the sacral ala slightly greater on the right. No acute fracture or suspicious bone lesion. DEGENERATIVE CHANGES: Degenerative changes of the bilateral sacroiliac joints. Degenerative endplate osteophytes of multiple levels. Mild disc space narrowing at L5-S1. There are small disc bulges at multiple levels. Facet arthrosis throughout the lumbar spine. There is mild bilateral foraminal stenosis at L4-L5. Moderate foraminal stenosis bilaterally at L5-S1. No high grade osseous spinal canal stenosis. SOFT TISSUES: The visualized paraspinal soft tissues are unremarkable. There is extensive atherosclerosis of the abdominal aorta and branch vessels. Sequelae of cholecystectomy. IMPRESSION: 1. No acute findings. 2. Grade 1 anterolisthesis of L4 on L5, similar to prior study. 3. Degenerative changes as above. 4. Diffuse osteopenia with asymmetric prominence within the sacral ala, slightly greater on the right. Electronically signed by: Donnice Mania MD 09/06/2024 02:24 PM EST RP Workstation: [id number]   CT Cervical Spine Wo Contrast Result Date: 09/06/2024 EXAM: CT CERVICAL SPINE WITHOUT CONTRAST 09/06/2024 01:53:32 PM TECHNIQUE: CT of the cervical spine was performed without the  administration of intravenous contrast. Multiplanar reformatted images are provided for review. Automated exposure control, iterative reconstruction, and/or weight based adjustment of the mA/kV was utilized to reduce the radiation dose to as low as reasonably achievable. COMPARISON: 01/09/2024 CLINICAL HISTORY: Ataxia, cervical trauma. FINDINGS: CERVICAL SPINE: BONES AND ALIGNMENT: Similar trace degenerative retrolisthesis of C3 on C4. Alignment is otherwise maintained. No evidence of traumatic malalignment. No acute fracture. DEGENERATIVE CHANGES: Disc space narrowing most pronounced posteriorly at C6-C7. Disc osteophyte complexes at multiple levels without evidence of high grade osseous spinal canal stenosis. Facet arthrosis and uncovertebral hypertrophy at multiple levels. Foraminal stenosis greatest at C3-C4 and C6-C7. SOFT TISSUES: No prevertebral soft tissue swelling. Atherosclerosis at the carotid bifurcations. INCIDENTAL LUNG FINDINGS: Emphysema  in the lung apices. IMPRESSION: 1. No acute abnormality of the cervical spine. 2. Degenerative changes as above. 3. Incidental emphysema in the lung apices; for patients aged 74, consider evaluation for a low-dose CT lung cancer screening program. Electronically signed by: Donnice Mania MD 09/06/2024 02:17 PM EST RP Workstation: [id number]   CT Head Wo Contrast Result Date: 09/06/2024 EXAM: CT HEAD WITHOUT CONTRAST 09/06/2024 01:53:32 PM TECHNIQUE: CT of the head was performed without the administration of intravenous contrast. Automated exposure control, iterative reconstruction, and/or weight based adjustment of the mA/kV was utilized to reduce the radiation dose to as low as reasonably achievable. COMPARISON: 03/09/2024 CLINICAL HISTORY: Head trauma, minor (Age >= 65y). FINDINGS: BRAIN AND VENTRICLES: No acute hemorrhage. No evidence of acute infarct. Proportional prominence of the ventricles and sulci, consistent with diffuse cerebral parenchymal volume  loss. Scattered periventricular white matter hypoattenuation, consistent with mild chronic ischemic microvascular disease. No hydrocephalus. No extra-axial collection. No mass effect or midline shift. Calcified atherosclerotic plaque within cavernous/supraclinoid ICA and intradural vertebral arteries. ORBITS: Bilateral lens replacement noted. SINUSES: No acute abnormality. SOFT TISSUES AND SKULL: No acute soft tissue abnormality. No skull fracture. IMPRESSION: 1. No acute intracranial abnormality. 2. Mild cerebral parenchymal volume loss and mild chronic ischemic microvascular disease. Electronically signed by: Donnice Mania MD 09/06/2024 02:12 PM EST RP Workstation: [id number]   EKG: Independently reviewed.  Sinus rhythm, rate 89, QTc 436.  No significant change from prior.  Assessment/Plan Principal Problem:   Falls Active Problems:   COPD (chronic obstructive pulmonary disease) (HCC)   Seizure disorder (HCC)   History of CVA (cerebrovascular accident)   History of breast cancer   Bipolar disorder (HCC)   Chronic pain syndrome  Assessment and Plan: No notes have been filed under this hospital service. Service: Hospitalist  Falls, pubic rami fractures-acute fractures to right superior pubic rami, left superior pubic rami at the anterior acetabulum, and left inferior pubic rami-seen on CT pelvis.  Mechanical falls.  Unable to ambulate.  Dry mucous membranes - IV morphine  2 mg every 4 hours as needed - PT OT evaluation - EDP talked to orthopedist on-call, Dr. Lorretta, fractures are nonoperative - N/s + 40 kcl 100cc/hr x 15hrs  COPD-stable. - Resume home regimen  Seizure, stroke - Resume phenytoin   Bipolar disorder -Awaiting med reconciliation  Breast cancer - Resume tamoxifen    DVT prophylaxis: Lovenox   Code Status: Full code Family Communication:  None at bedside Disposition Plan:  ~ 2 days Consults called:  none Admission status:  Obs med  surg   Author: Tully FORBES Carwin, MD 09/06/2024 6:49 PM  For on call review [email or web address].

## 2024-09-07 DIAGNOSIS — F319 Bipolar disorder, unspecified: Secondary | ICD-10-CM | POA: Diagnosis not present

## 2024-09-07 DIAGNOSIS — S32591D Other specified fracture of right pubis, subsequent encounter for fracture with routine healing: Secondary | ICD-10-CM

## 2024-09-07 DIAGNOSIS — Z853 Personal history of malignant neoplasm of breast: Secondary | ICD-10-CM | POA: Diagnosis not present

## 2024-09-07 DIAGNOSIS — S32511A Fracture of superior rim of right pubis, initial encounter for closed fracture: Secondary | ICD-10-CM | POA: Diagnosis not present

## 2024-09-07 DIAGNOSIS — R296 Repeated falls: Secondary | ICD-10-CM | POA: Diagnosis not present

## 2024-09-07 DIAGNOSIS — G40909 Epilepsy, unspecified, not intractable, without status epilepticus: Secondary | ICD-10-CM | POA: Diagnosis not present

## 2024-09-07 DIAGNOSIS — S32512A Fracture of superior rim of left pubis, initial encounter for closed fracture: Secondary | ICD-10-CM | POA: Diagnosis not present

## 2024-09-07 DIAGNOSIS — S32592A Other specified fracture of left pubis, initial encounter for closed fracture: Secondary | ICD-10-CM | POA: Diagnosis not present

## 2024-09-07 DIAGNOSIS — Z8673 Personal history of transient ischemic attack (TIA), and cerebral infarction without residual deficits: Secondary | ICD-10-CM

## 2024-09-07 DIAGNOSIS — S32592D Other specified fracture of left pubis, subsequent encounter for fracture with routine healing: Secondary | ICD-10-CM

## 2024-09-07 MED ORDER — CLONAZEPAM 0.5 MG PO TABS
0.5000 mg | ORAL_TABLET | Freq: Two times a day (BID) | ORAL | Status: DC | PRN
  Administered 2024-09-07: 0.5 mg via ORAL
  Filled 2024-09-07: qty 1

## 2024-09-07 MED ORDER — OXYCODONE HCL 5 MG PO TABS: 5.0000 mg | ORAL_TABLET | Freq: Four times a day (QID) | ORAL | Status: DC | PRN

## 2024-09-07 MED ORDER — MELATONIN 3 MG PO TABS
6.0000 mg | ORAL_TABLET | Freq: Every evening | ORAL | Status: DC | PRN
Start: 2024-09-07 — End: 2024-09-08

## 2024-09-07 MED ORDER — POLYETHYLENE GLYCOL 3350 17 G PO PACK: 17.0000 g | PACK | Freq: Every day | ORAL | Status: DC | PRN

## 2024-09-07 MED ORDER — ACETAMINOPHEN 500 MG PO TABS
1000.0000 mg | ORAL_TABLET | Freq: Three times a day (TID) | ORAL | Status: DC
  Administered 2024-09-07 – 2024-09-08 (×3): 1000 mg via ORAL
  Filled 2024-09-07 (×4): qty 2

## 2024-09-07 MED ORDER — MORPHINE SULFATE (PF) 2 MG/ML IV SOLN: 2.0000 mg | INTRAVENOUS | Status: DC | PRN

## 2024-09-07 MED ORDER — PROCHLORPERAZINE EDISYLATE 10 MG/2ML IJ SOLN: 5.0000 mg | Freq: Four times a day (QID) | INTRAMUSCULAR | Status: DC | PRN

## 2024-09-07 NOTE — Consult Note (Signed)
 ORTHOPAEDIC CONSULTATION  REQUESTING PHYSICIAN: Ricky Fines, MD  ASSESSMENT AND PLAN: 73 y.o. female with the following: Bilateral  Superior Pubic Rami Fractures, Left inferior pubic ramus fracture    Reasonable for medical admission for pain control and work with PT; can provide consultation and follow along PRN  - Weight Bearing Status/Activity: WBAT Bilateral  lower extremity  - Additional recommended labs/tests: None  -VTE Prophylaxis: at discretion of medicine team  - Pain control: As needed; recommend limited use of narcotics  - Follow-up plan: 2-3 weeks in clinic; will repeat XR.    - Anticipate progressive improvement in pain and function; will require 2-3 months for full recovery   Chief Complaint: Bilateral  hip pain  HPI: [name] is a 73 y.o. female who presented to the ED after sustaining a mechanical fall immediately prior to coming to the hospital today.  Complaining of Bilateral  hip pain.  No pain elsewhere.  Did not hit their head.  Unable to ambulate since the fall.  The pain is sharp with attempted movements.  As long as they are not moving, the pain improves.  The pain does not radiate.    Past Medical History:  Diagnosis Date   Alcohol abuse, in remission    Anxiety    Arthritis    Bipolar disorder (HCC)    Colitis    COPD (chronic obstructive pulmonary disease) (HCC)    occasional home O2   Gastric ulcer    GERD (gastroesophageal reflux disease)    Hemorrhoids    IBS (irritable bowel syndrome)    Seizures (HCC)    last seizure was about a year ago   Stroke [facility])    right sided weakness-slight   Past Surgical History:  Procedure Laterality Date   ABDOMINAL HYSTERECTOMY     BIOPSY  09/13/2022   Procedure: BIOPSY;  Surgeon: Cindie Carlin POUR, DO;  Location: [facility];  Service: Endoscopy;;   CHOLECYSTECTOMY     COLONOSCOPY WITH PROPOFOL  N/A 09/13/2022   Procedure: COLONOSCOPY WITH PROPOFOL ;  Surgeon: Cindie Carlin POUR, DO;   Location: [facility];  Service: Endoscopy;  Laterality: N/A;  11:15 am   COMPRESSION HIP SCREW Right 04/25/2019   Procedure: COMPRESSION HIP;  Surgeon: Margrette Taft BRAVO, MD;  Location: [facility] OR[facility];  Service: Orthopedics;  Laterality: Right;  1030am   ESOPHAGEAL BRUSHING  07/04/2022   Procedure: ESOPHAGEAL BRUSHING;  Surgeon: Shaaron Lamar HERO, MD;  Location[facility];  Service: Endoscopy;;   ESOPHAGOGASTRODUODENOSCOPY (EGD) WITH PROPOFOL  N/A 07/04/2022   Procedure: ESOPHAGOGASTRODUODENOSCOPY (EGD) WITH PROPOFOL ;  Surgeon: Shaaron Lamar HERO, MD;  [facility];  Service: Endoscopy;  Laterality: N/A;  ESOPHAGOGASTRODUODENOSCOPY WITH FOOD IMPACTION REMOVAL   ESOPHAGOGASTRODUODENOSCOPY (EGD) WITH PROPOFOL  N/A 09/13/2022   Procedure: ESOPHAGOGASTRODUODENOSCOPY (EGD) WITH PROPOFOL ;  Surgeon: Cindie Carlin POUR, DO;  [facility];  Service: Endoscopy;  Laterality: N/A;   EYE SURGERY Bilateral    cataract removal   MASTECTOMY, PARTIAL Right 07/20/2020   Procedure: MASTECTOMY PARTIAL;  Surgeon: Mavis Anes, MD;  Location: [facility];  Service: General;  Laterality: Right;   PARTIAL MASTECTOMY WITH AXILLARY SENTINEL LYMPH NODE BIOPSY Right 07/06/2020   Procedure: RIGHT PARTIAL MASTECTOMY WITH AXILLARY SENTINEL LYMPH NODE BIOPSY;  Surgeon: Mavis Anes, MD;  Location: [facility];  Service: General;  Laterality: Right;   POLYPECTOMY  09/13/2022   Procedure: POLYPECTOMY;  Surgeon: Cindie Carlin POUR, DO;  [facility];  Service: Endoscopy;;   Social History  Socioeconomic History   Marital status: Married    Spouse name: Not on file   Number of children: Not on file   Years of education: Not on file   Highest education level: Not on file  Occupational History   Occupation: retired  Tobacco Use   Smoking status: Every Day    Current packs/day: 0.50    Average packs/day: 0.5 packs/day for 32.0 years (16.0 ttl pk-yrs)    Types: Cigarettes   Smokeless tobacco: Never  Vaping Use    Vaping status: Some Days  Substance and Sexual Activity   Alcohol use: No    Comment: h/o heavy use, quit in 1995   Drug use: Yes    Types: Marijuana    Comment: sometimes   Sexual activity: Not Currently  Other Topics Concern   Not on file  Social History Narrative   Not on file   Social Drivers of Health   Financial Resource Strain: Low Risk  (08/11/2020)   Overall Financial Resource Strain (CARDIA)    Difficulty of Paying Living Expenses: Not hard at all  Food Insecurity: No Food Insecurity (09/06/2024)   Hunger Vital Sign    Worried About Running Out of Food in the Last Year: Never true    Ran Out of Food in the Last Year: Never true  Transportation Needs: No Transportation Needs (09/06/2024)   PRAPARE - Administrator, Civil Service (Medical): No    Lack of Transportation (Non-Medical): No  Physical Activity: Insufficiently Active (08/11/2020)   Exercise Vital Sign    Days of Exercise per Week: 7 days    Minutes of Exercise per Session: 10 min  Stress: Stress Concern Present (08/11/2020)   [employer] of Occupational Health - Occupational Stress Questionnaire    Feeling of Stress : Rather much  Social Connections: Moderately Integrated (09/06/2024)   Social Connection and Isolation Panel    Frequency of Communication with Friends and Family: Three times a week    Frequency of Social Gatherings with Friends and Family: Three times a week    Attends Religious Services: 1 to 4 times per year    Active Member of Clubs or Organizations: No    Attends Engineer, Structural: Never    Marital Status: Married   Family History  Problem Relation Age of Onset   Dementia Father    Allergies  Allergen Reactions   Varenicline Hives   Codeine Nausea And Vomiting   Haemophilus B Polysaccharide Vaccine Nausea And Vomiting   Influenza Virus Vaccine Nausea And Vomiting   Lithium  Nausea And Vomiting   Prior to Admission medications   Medication Sig  Start Date End Date Taking? Authorizing Provider  acetaminophen  (TYLENOL ) 500 MG tablet Take 500 mg by mouth every 6 (six) hours as needed for mild pain (pain score 1-3).   Yes [provider]  albuterol  (PROVENTIL ) (2.5 MG/3ML) 0.083% nebulizer solution Take 3 mLs (2.5 mg total) by nebulization every 4 (four) hours as needed for wheezing or shortness of breath. 06/13/24  Yes Johnson, Clanford L, MD  albuterol  (VENTOLIN  HFA) 108 (90 Base) MCG/ACT inhaler Inhale 2 puffs into the lungs every 6 (six) hours as needed for wheezing or shortness of breath. 06/13/24  Yes Johnson, Clanford L, MD  Aromatic Inhalants (VICKS VAPOINHALER IN) Inhale 1 puff into the lungs daily as needed (congestion).   Yes [provider]  atorvastatin  (LIPITOR) 20 MG tablet Take 1 tablet (20 mg total) by mouth every evening. 06/24/24  Yes Bevely Doffing, FNP  budeson-glycopyrrolate -formoterol  (BREZTRI  AEROSPHERE) 160-9-4.8 MCG/ACT AERO inhaler Inhale 2 puffs into the lungs 2 (two) times daily.   Yes [provider]  carboxymethylcellulose (REFRESH PLUS) 0.5 % SOLN Place 1 drop into both eyes 3 (three) times daily as needed (dry/irritated eyes.).   Yes [provider]  clonazePAM  (KLONOPIN ) 1 MG tablet Take 1 mg by mouth 3 (three) times daily as needed. 08/19/24  Yes [provider]  dicyclomine  (BENTYL ) 10 MG capsule Take 10 mg by mouth 2 (two) times daily.   Yes [provider]  DULoxetine  (CYMBALTA ) 20 MG capsule Take 20 mg by mouth daily. 02/04/24  Yes [provider]  hydrOXYzine (ATARAX) 25 MG tablet Take 25 mg by mouth 2 (two) times daily. 09/10/22  Yes [provider]  lidocaine  (LIDODERM ) 5 % Place 1 patch onto the skin daily as needed. Remove & Discard patch within 12 hours or as directed by MD 09/10/23  Yes Elnor Jayson LABOR, DO  Multiple Vitamin (MULTIVITAMIN WITH MINERALS) TABS tablet Take 1 tablet by mouth 3 (three) times a week.   Yes [provider]  omeprazole  (PRILOSEC) 40 MG capsule Take 1 capsule (40 mg total) by mouth daily. 08/12/24  Yes Bevely Doffing, FNP  Oxycodone  HCl 10 MG TABS Take 10 mg by mouth 4 (four) times daily. 05/19/24  Yes [provider]  phenytoin  (DILANTIN ) 100 MG ER capsule Take 400 mg by mouth at bedtime.   Yes [provider]  tamoxifen  (NOLVADEX ) 20 MG tablet TAKE ONE TABLET BY MOUTH ONCE DAILY. 11/15/22  Yes Katragadda, Sreedhar, MD  tiZANidine (ZANAFLEX) 2 MG tablet Take 4 mg by mouth every 8 (eight) hours. 02/17/24  Yes [provider]  traZODone  (DESYREL ) 150 MG tablet Take 300 mg by mouth daily. 06/20/24  Yes [provider]  valACYclovir (VALTREX) 1000 MG tablet Take 1 tablet (1,000 mg total) by mouth 3 (three) times daily for 7 days. 09/04/24 09/11/24 Yes Kammerer, Megan L, DO   CT PELVIS WO CONTRAST Result Date: 09/06/2024 EXAM: CT PELVIS, WITHOUT IV CONTRAST 09/06/2024 04:08:46 PM TECHNIQUE: Axial images were acquired through the pelvis without IV contrast. Reformatted images were reviewed. Automated exposure control, iterative reconstruction, and/or weight based adjustment of the mA/kV was utilized to reduce the radiation dose to as low as reasonably achievable. COMPARISON: None available. CLINICAL HISTORY: fall, diffuse pelvic pain FINDINGS: BONES: Acute minimally displaced fracture of the right superior pubic ramus extending into the pubic symphysis. Acute nondisplaced fracture of the base of the left superior pubic ramus/anterior acetabulum. Acute minimally displaced fracture of the left inferior pubic ramus. Fixation right femur. JOINTS: The right superior pubic ramus fracture extends into the pubic symphysis. The left superior pubic ramus fracture is at the anterior acetabulum. No dislocation. SOFT TISSUES: The soft tissues are unremarkable. INTRAPELVIC CONTENTS: Limited images of the intrapelvic contents demonstrate no acute abnormality. IMPRESSION: 1. Acute minimally  displaced fracture of the right superior pubic ramus extending into the pubic symphysis. 2. Acute nondisplaced fracture of the base of the left superior pubic ramus at the anterior acetabulum. 3. Acute minimally displaced fracture of the left inferior pubic ramus. Electronically signed by: Norman Gatlin MD 09/06/2024 04:19 PM EST RP Workstation: [id number]   DG HIP UNILAT WITH PELVIS 2-3 VIEWS LEFT Result Date: 09/06/2024 EXAM: 2 or 3 VIEW(S) XRAY OF THE LEFT HIP 09/06/2024 02:05:00 PM COMPARISON: CT abdomen/pelvis 09/04/2024 and hip radiographs 01/28/2024. CLINICAL HISTORY: Fall. Pain. FINDINGS: BONES AND JOINTS: Diffuse  osseous demineralization. No acute fracture or dislocation of the left hip. 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 Mild osteoarthritis of the left hip. SOFT TISSUES: No acute abnormality. Peripheral vascular calcifications. IMPRESSION: 1. No acute fracture or dislocation of the left hip. 2. Chronic avulsion fracture of the left greater trochanter, better evaluated on prior CT. Electronically signed by: Harrietta Sherry MD 09/06/2024 02:44 PM EST RP Workstation: [id number]   DG HIP UNILAT WITH PELVIS 2-3 VIEWS RIGHT Result Date: 09/06/2024 EXAM: 2 or 3 VIEW(S) XRAY OF THE RIGHT HIP 09/06/2024 02:05:00 PM COMPARISON: CT abdomen/pelvis dated 09/04/2024 and hip radiographs dated 01/28/2024. CLINICAL HISTORY: Fall. Pain. FINDINGS: BONES AND JOINTS: Diffuse osseous demineralization. Evaluation of the sacrum is limited due to overlying bowel gas. No acute fracture or dislocation. Prior ORIF of the right proximal femur with unchanged alignment. Hardware is intact. Mild osteoarthritis of the right hip. Degenerative changes of the visualized lower lumbar spine. SOFT TISSUES: The soft tissues are unremarkable. IMPRESSION: 1. No acute fracture or dislocation of the right hip. 2. Prior ORIF of the right proximal femur with unchanged  alignment and intact hardware. 3. Diffuse osseous demineralization. 4. Degenerative changes of the visualized lower lumbar spine. Electronically signed by: Harrietta Sherry MD 09/06/2024 02:40 PM EST RP Workstation: [id number]   CT THORACIC SPINE WO CONTRAST Result Date: 09/06/2024 EXAM: CT THORACIC SPINE WITHOUT CONTRAST 09/06/2024 02:07:00 PM TECHNIQUE: CT of the thoracic spine was performed without the administration of intravenous contrast. Multiplanar reformatted images are provided for review. Automated exposure control, iterative reconstruction, and/or weight based adjustment of the mA/kV was utilized to reduce the radiation dose to as low as reasonably achievable. COMPARISON: None available. CLINICAL HISTORY: FINDINGS: BONES AND ALIGNMENT: Normal vertebral body heights. No acute fracture or suspicious bone lesion. Normal alignment. DEGENERATIVE CHANGES: No severe degenerative changes. SOFT TISSUES: No acute abnormality. IMPRESSION: 1. No acute abnormality of the thoracic spine. Electronically signed by: Norman Gatlin MD 09/06/2024 02:37 PM EST RP Workstation: [id number]   DG Knee Complete 4 Views Right Result Date: 09/06/2024 EXAM: 4 VIEW(S) XRAY OF THE RIGHT KNEE 09/06/2024 02:05:00 PM COMPARISON: Right femur radiographs 10/14/2023. CLINICAL HISTORY: fall diffuse pain FINDINGS: BONES AND JOINTS: Diffuse osseous demineralization. No acute fracture or dislocation. No significant joint effusion. Mild tricompartmental osteoarthritis. SOFT TISSUES: Peripheral vascular calcifications. The soft tissues are otherwise unremarkable. IMPRESSION: 1. No acute osseous abnormality. 2. Mild tricompartmental osteoarthritis. Electronically signed by: Harrietta Sherry MD 09/06/2024 02:35 PM EST RP Workstation: [id number]   DG Chest Portable 1 View Result Date: 09/06/2024 EXAM: 1 VIEW(S) XRAY OF THE CHEST 09/06/2024 02:05:00 PM COMPARISON: 08/25/2024. CLINICAL HISTORY: fall diffuse pain FINDINGS: LUNGS AND PLEURA: No  focal pulmonary opacity. No overt pulmonary edema. No pleural effusion. No pneumothorax. HEART AND MEDIASTINUM: Aortic atherosclerosis. Stable cardiomediastinal contours. BONES AND SOFT TISSUES: Right axillary surgical clips. No acute osseous abnormality. IMPRESSION: 1. No acute cardiopulmonary findings. Electronically signed by: Harrietta Sherry MD 09/06/2024 02:33 PM EST RP Workstation: [id number]   CT LUMBAR SPINE WO CONTRAST Result Date: 09/06/2024 EXAM: CT OF THE LUMBAR SPINE WITHOUT CONTRAST 09/06/2024 01:53:14 PM TECHNIQUE: CT of the lumbar spine was performed without the administration of intravenous contrast. Multiplanar reformatted images are provided for review. Automated exposure control, iterative reconstruction, and/or weight based adjustment of the mA/kV was utilized to reduce the radiation dose to as low as reasonably achievable. COMPARISON: MRI lumbar spine 12/28/2022. CLINICAL HISTORY: FINDINGS: BONES AND ALIGNMENT: Normal vertebral body heights. Grade 1  anterolisthesis of L4 on L5 is similar to prior. Lumbar lordosis is maintained. Diffuse osteopenia. There is asymmetric prominence of osteopenia within the sacral ala slightly greater on the right. No acute fracture or suspicious bone lesion. DEGENERATIVE CHANGES: Degenerative changes of the bilateral sacroiliac joints. Degenerative endplate osteophytes of multiple levels. Mild disc space narrowing at L5-S1. There are small disc bulges at multiple levels. Facet arthrosis throughout the lumbar spine. There is mild bilateral foraminal stenosis at L4-L5. Moderate foraminal stenosis bilaterally at L5-S1. No high grade osseous spinal canal stenosis. SOFT TISSUES: The visualized paraspinal soft tissues are unremarkable. There is extensive atherosclerosis of the abdominal aorta and branch vessels. Sequelae of cholecystectomy. IMPRESSION: 1. No acute findings. 2. Grade 1 anterolisthesis of L4 on L5, similar to prior study. 3. Degenerative changes as  above. 4. Diffuse osteopenia with asymmetric prominence within the sacral ala, slightly greater on the right. Electronically signed by: Donnice Mania MD 09/06/2024 02:24 PM EST RP Workstation: [id number]   CT Cervical Spine Wo Contrast Result Date: 09/06/2024 EXAM: CT CERVICAL SPINE WITHOUT CONTRAST 09/06/2024 01:53:32 PM TECHNIQUE: CT of the cervical spine was performed without the administration of intravenous contrast. Multiplanar reformatted images are provided for review. Automated exposure control, iterative reconstruction, and/or weight based adjustment of the mA/kV was utilized to reduce the radiation dose to as low as reasonably achievable. COMPARISON: 01/09/2024 CLINICAL HISTORY: Ataxia, cervical trauma. FINDINGS: CERVICAL SPINE: BONES AND ALIGNMENT: Similar trace degenerative retrolisthesis of C3 on C4. Alignment is otherwise maintained. No evidence of traumatic malalignment. No acute fracture. DEGENERATIVE CHANGES: Disc space narrowing most pronounced posteriorly at C6-C7. Disc osteophyte complexes at multiple levels without evidence of high grade osseous spinal canal stenosis. Facet arthrosis and uncovertebral hypertrophy at multiple levels. Foraminal stenosis greatest at C3-C4 and C6-C7. SOFT TISSUES: No prevertebral soft tissue swelling. Atherosclerosis at the carotid bifurcations. INCIDENTAL LUNG FINDINGS: Emphysema in the lung apices. IMPRESSION: 1. No acute abnormality of the cervical spine. 2. Degenerative changes as above. 3. Incidental emphysema in the lung apices; for patients aged 74, consider evaluation for a low-dose CT lung cancer screening program. Electronically signed by: Donnice Mania MD 09/06/2024 02:17 PM EST RP Workstation: [id number]   CT Head Wo Contrast Result Date: 09/06/2024 EXAM: CT HEAD WITHOUT CONTRAST 09/06/2024 01:53:32 PM TECHNIQUE: CT of the head was performed without the administration of intravenous contrast. Automated exposure control, iterative reconstruction,  and/or weight based adjustment of the mA/kV was utilized to reduce the radiation dose to as low as reasonably achievable. COMPARISON: 03/09/2024 CLINICAL HISTORY: Head trauma, minor (Age >= 65y). FINDINGS: BRAIN AND VENTRICLES: No acute hemorrhage. No evidence of acute infarct. Proportional prominence of the ventricles and sulci, consistent with diffuse cerebral parenchymal volume loss. Scattered periventricular white matter hypoattenuation, consistent with mild chronic ischemic microvascular disease. No hydrocephalus. No extra-axial collection. No mass effect or midline shift. Calcified atherosclerotic plaque within cavernous/supraclinoid ICA and intradural vertebral arteries. ORBITS: Bilateral lens replacement noted. SINUSES: No acute abnormality. SOFT TISSUES AND SKULL: No acute soft tissue abnormality. No skull fracture. IMPRESSION: 1. No acute intracranial abnormality. 2. Mild cerebral parenchymal volume loss and mild chronic ischemic microvascular disease. Electronically signed by: Donnice Mania MD 09/06/2024 02:12 PM EST RP Workstation: [id number]   Family History Reviewed and non-contributory, no pertinent history of problems with bleeding or anesthesia    Review of Systems No fevers or chills No numbness or tingling No chest pain No shortness of breath No bowel or bladder dysfunction No GI distress No headaches  OBJECTIVE  Vitals:Patient Vitals for the past 8 hrs:  BP Temp Temp src Pulse Resp SpO2  09/07/24 2007 (!) 142/60 98.4 F (36.9 C) Oral 78 19 98 %   General: Alert, no acute distress Cardiovascular: Warm extremities noted Respiratory: No cyanosis, no use of accessory musculature Skin: No lesions in the area of chief complaint other than those listed below in MSK exam. Some ecchymosis over the lateral hip.  Neurologic: Sensation intact distally save for the below mentioned MSK exam Psychiatric: Patient is competent for consent with normal mood and affect Lymphatic: No  swelling obvious and reported other than the area involved in the exam below Extremities  RLE:  ROM deferred due to know fracture.  Tolerates axial loading and gentle logroll.  Sensation is intact in the S/S/SP/DP/T nerve distributions.  Active motion intact TA/EHL/GS.  Toes WWP.  2+ DP pulse.  LLE: No pain with gentle ROM of hip and rest of LE.  Sensation is intact in the S/S/SP/DP/T nerve distributions.  Active motion intact TA/EHL/GS.  Toes WWP.  2+ DP pulse.     Test Results Imaging XR and CT scan demonstrates minimally displaced fracture of the Bilateral  Superior Pubic Rami Fractures and left sided Inferior Pubic Ramus Fracture.    Labs cbc Recent Labs    09/06/24 1412  WBC 14.1*  HGB 12.9  HCT 37.7  PLT 208     Recent Labs    09/06/24 1412  NA 134*  K 3.4*  CL 99  CO2 21*  GLUCOSE 152*  BUN 14  CREATININE 0.77  CALCIUM  8.1*

## 2024-09-07 NOTE — Progress Notes (Signed)
 Mobility Specialist Progress Note:    09/07/24 0942  Mobility  Activity Pivoted/transferred to/from [facility]  Level of Assistance Minimal assist, patient does 75% or more  Assistive Device None  Distance Ambulated (ft) 4 ft  Range of Motion/Exercises Active;All extremities  Activity Response Tolerated well  Mobility Referral Yes  Mobility visit 1 Mobility  Mobility Specialist Start Time (ACUTE ONLY) [id number]  Mobility Specialist Stop Time (ACUTE ONLY) 1000  Mobility Specialist Time Calculation (min) (ACUTE ONLY) 18 min   Pt received sitting EOB, requesting assistance to [facility]. Required MinA to stand and transfer with no AD. Tolerated well, call bell in reach. All needs met.  Derrick Tiegs Mobility Specialist Please contact via Special Educational Needs Teacher or  Rehab office at [phone]
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
## 2024-09-07 NOTE — Progress Notes (Signed)
 Progress Note   Patient: [name] FMW:[id number] DOB: 01-29-1951 DOA: 09/06/2024     0 DOS: the patient was seen and examined on 09/07/2024   Brief hospital admission narrative: As per H&P written by Dr. Pearlean on 09/06/2024 [name] is a 73 y.o. female with medical history significant for COPD, bipolar disorder, seizure disorder, chronic pain, alcohol abuse, stroke. Patient presented to the ED with reports of a fall today, at about 11 AM, patient fell from bed.  She has had several falls.  She ambulates with walker or cane.  She has bruises to her lower extremities which she attributes to her dog.  She reports good oral intake, no vomiting no diarrhea no dizziness.  She reports pain to her back, hips, legs, and that she is unable to ambulate.   ED Course: Stable vitals.  Potassium 3.4.  Troponin less than 15 x 2.  WBC 14.1. - CT lumbar spine without contrast-no acute abnormality, shows grade 1 anterolisthesis of L4 on L5 similar to prior - Head CT, cervical CT, thoracic CT negative for acute abnormality - Port chest x-ray, right knee x-ray, pelvic x-ray-negative for acute abnormality. - CT pelvis without contrast-acute minimally displaced fracture of the right superior pubic rami extending to the pubic symphysis, acute nondisplaced fracture of the base of the left superior pubic rami at the anterior acetabulum, acute minimally displaced fracture of the left inferior pubic rami. EDP talked to orthopedist on-call Dr. Delbra fractures, recommend none-weightbearing. Patient unable to ambulate with persistent pain.  Hospitalization requested patient may need rehab.  Assessment and plan 1-falls/pubic rami fracture - No surgical intervention needed - Case discussed with orthopedic service - Continue weightbearing as tolerated in, physical conditioning and as needed pain medications. -PT evaluation/assessment has been done with recommendations for skilled nursing facility  at discharge to pursued rehabilitation. - TOC has been made aware and working with placement  2-history of seizures/stroke - No focal neurologic deficits - Continue risk factors modification - Continue phenytoin .  3-history of breast cancer - Continue tamoxifen .  4-bipolar disorder - No suicidal ideation or hallucination - Continue home psychotropic agents. - Continue patient follow up with psychiatry service.  5-history of COPD - Stable and currently compensated - Continue bronchodilator management - Good saturation on room air appreciated.  6-hypokalemia - Continue to further replete electrolytes as needed - Follow trend.  Subjective:  Afebrile, no chest pain, no nausea vomiting.  Reporting ongoing pain in her pelvic bones; but expressed relief with ongoing analgesic therapy.  Physical Exam: Vitals:   09/06/24 2209 09/07/24 0521 09/07/24 0831 09/07/24 1228  BP: 121/67 121/70  128/78  Pulse: 83 76  76  Resp: 20 (!) 22    Temp: 99.4 F (37.4 C) 98.9 F (37.2 C)  98.4 F (36.9 C)  TempSrc: Oral Oral  Oral  SpO2: 96% 97% 96% 98%  Weight:      Height:       General exam: Alert, awake, oriented x 3; following commands appropriately. Respiratory system: Clear to auscultation. Respiratory effort normal. Cardiovascular system:RRR. No murmurs, rubs, gallops. Gastrointestinal system: Abdomen is nondistended, soft and nontender. No organomegaly or masses felt. Normal bowel sounds heard. Central nervous system: No focal neurological deficits. Extremities: No cyanosis or clubbing.  Decreased range of motion in her lower extremities due to pain. Skin: No petechiae. Psychiatry: Mood & affect appropriate.    Latest data Reviewed: CBC: WBC 6.6, hemoglobin 13.3 and platelet count 258K Comprehensive metabolic panel: Sodium 134, potassium  3.4, chloride 99, bicarb 21, BUN 14, creatinine 0.77, normal LFTs and a GFR >60  Family Communication: No family at  bedside.  Disposition: Status is: Observation The patient remains OBS appropriate and will d/c before 2 midnights.   Time spent: 50 minutes  Author: Eric Nunnery, MD 09/07/2024 5:44 PM  For on call review [email or web address].

## 2024-09-07 NOTE — Progress Notes (Signed)
 Mobility Specialist Progress Note:    09/07/24 1300  Mobility  Activity Ambulated with assistance  Level of Assistance Minimal assist, patient does 75% or more  Assistive Device Front wheel walker  Distance Ambulated (ft) 10 ft  Range of Motion/Exercises Active;All extremities  RLE Weight Bearing Per Provider Order NWB  Activity Response Tolerated well  Mobility Referral Yes  Mobility visit 1 Mobility  Mobility Specialist Start Time (ACUTE ONLY) 1300  Mobility Specialist Stop Time (ACUTE ONLY) 1320  Mobility Specialist Time Calculation (min) (ACUTE ONLY) 20 min   Pt received in bed, requesting assistance to bathroom. Required MinA to stand and CGA to ambulate with RW. Tolerated well, educated pt on importance of using RW to keep weight off of LLE during ambulation. Left in bathroom, All needs met.  Aolani Piggott Mobility Specialist Please contact via Special Educational Needs Teacher or  Rehab office at [phone]

## 2024-09-07 NOTE — Plan of Care (Signed)
  Problem: Acute Rehab PT Goals(only PT should resolve) Goal: Pt Will Go Supine/Side To Sit Outcome: Progressing Flowsheets (Taken 09/07/2024 1359) Pt will go Supine/Side to Sit:  with modified independence  Independently Goal: Pt Will Go Sit To Supine/Side Outcome: Progressing Flowsheets (Taken 09/07/2024 1359) Pt will go Sit to Supine/Side:  with supervision  with modified independence Goal: Patient Will Perform Sitting Balance Outcome: Progressing Flowsheets (Taken 09/07/2024 1359) Patient will perform sitting balance:  with supervision  with modified independence Goal: Patient Will Transfer Sit To/From Stand Outcome: Progressing Flowsheets (Taken 09/07/2024 1359) Patient will transfer sit to/from stand: with minimal assist Note: W/ RW monitor precautions Goal: Pt Will Transfer Bed To Chair/Chair To Bed Outcome: Progressing Flowsheets (Taken 09/07/2024 1359) Pt will Transfer Bed to Chair/Chair to Bed:  with contact guard assist  with min assist Note: W/ RW monitor precautions Goal: Pt Will Perform Standing Balance Or Pre-Gait Outcome: Progressing Flowsheets (Taken 09/07/2024 1359) Pt will perform standing balance or pre-gait:  with contact guard assist  with minimal assist Note: W/ RW monitor precautions Goal: Pt Will Ambulate Outcome: Progressing Flowsheets (Taken 09/07/2024 1359) Pt will Ambulate:  with contact guard assist  50 feet  with rolling walker Note: W/ RW monitor precautions     Ivery Cable, SPT

## 2024-09-07 NOTE — TOC Initial Note (Signed)
 Transition of Care [facility]) - Initial/Assessment Note    Patient Details  Name: [name] MRN: [id number] Date of Birth: 12-10-1950  [facility] CM/SW Contact:    Mcarthur Saddie Kim, LCSW Phone Number: 09/07/2024, 11:17 AM  Clinical Narrative: Pt admitted due to falls with pubic rami fractures. PT evaluated pt and recommend SNF. LCSW discussed placement process with pt, including insurance authorization and Medicare.gov ratings. Pt requests placement in [place] or [place]. LCSW will initiate bed search and ask CMA to start authorization. Will follow up with bed offers when available.                   Expected Discharge Plan: Skilled Nursing Facility Barriers to Discharge: No SNF bed, Insurance Authorization   Patient Goals and CMS Choice Patient states their goals for this hospitalization and ongoing recovery are:: short term SNF   Choice offered to / list presented to : Patient Manor ownership interest in [facility].provided to:: Patient    Expected Discharge Plan and Services In-house Referral: Clinical Social Work   Post Acute Care Choice: Skilled Nursing Facility Living arrangements for the past 2 months: Single Family Home                                      Prior Living Arrangements/Services Living arrangements for the past 2 months: Single Family Home Lives with:: Spouse Patient language and need for interpreter reviewed:: Yes Do you feel safe going back to the place where you live?: Yes      Need for Family Participation in Patient Care: No (Comment)     Criminal Activity/Legal Involvement Pertinent to Current Situation/Hospitalization: No - Comment as needed  Activities of Daily Living   ADL Screening (condition at time of admission) Independently performs ADLs?: Yes (appropriate for developmental age) Is the patient deaf or have difficulty hearing?: No Does the patient have difficulty seeing, even when wearing  glasses/contacts?: No Does the patient have difficulty concentrating, remembering, or making decisions?: No  Permission Sought/Granted                  Emotional Assessment     Affect (typically observed): Appropriate Orientation: : Oriented to Situation, Oriented to  Time, Oriented to Place, Oriented to Self Alcohol / Substance Use: Not Applicable Psych Involvement: No (comment)  Admission diagnosis:  Falls [R29.6] Fall, initial encounter [W19.XXXA] Multiple closed fractures of pelvis without disruption of pelvic ring, initial encounter [facility]) [S32.82XA] Patient Active Problem List   Diagnosis Date Noted   Falls 09/06/2024   Bilateral pubic rami fractures (HCC) 09/06/2024   Chronic pain syndrome 06/24/2024   Acute respiratory failure with hypoxia (HCC) 06/12/2024   HLD (hyperlipidemia) 06/12/2024   Seizure disorder (HCC) 06/12/2024   History of CVA (cerebrovascular accident) 06/12/2024   History of breast cancer 06/12/2024   Bipolar disorder (HCC) 06/12/2024   Acute hypoxic respiratory failure (HCC) 03/10/2024   Acute exacerbation of COPD with asthma (HCC) 03/09/2024   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)    S/P right hip fracture    Acute blood loss as cause of postoperative anemia    Closed fracture of right hip (HCC) s/p compression fixation 04/25/19 04/24/2019   Leukocytosis 04/24/2019   COPD (chronic obstructive pulmonary disease) (HCC) 05/30/2017   Colitis 08/21/2016   Adrenal mass, left 08/21/2016   Solitary pulmonary nodule 08/21/2016  Tobacco use disorder 08/21/2016   GERD (gastroesophageal reflux disease) 06/13/2010   IRRITABLE BOWEL SYNDROME 06/13/2010   HEMATOCHEZIA 06/13/2010   Jefferson LOSS, ABNORMAL 06/13/2010   ABDOMINAL PAIN, GENERALIZED 06/13/2010   PCP:  Bevely Doffing, FNP Pharmacy:   [facility] [place], [place] - [address] [place] [address] Phone: [phone] Fax:  [phone]     Social Drivers of Health (SDOH) Social History: SDOH Screenings   Food Insecurity: No Food Insecurity (09/06/2024)  Housing: High Risk (09/06/2024)  Transportation Needs: No Transportation Needs (09/06/2024)  Utilities: Not At Risk (09/06/2024)  Depression (PHQ2-9): Medium Risk (06/24/2024)  Financial Resource Strain: Low Risk  (08/11/2020)  Physical Activity: Insufficiently Active (08/11/2020)  Social Connections: Moderately Integrated (09/06/2024)  Stress: Stress Concern Present (08/11/2020)  Tobacco Use: High Risk (09/06/2024)   SDOH Interventions: Housing Interventions: Inpatient TOC, Other (Comment) (Resources added to AVS.)   Readmission Risk Interventions    03/10/2024    7:54 AM  Readmission Risk Prevention Plan  Transportation Screening Complete  Home Care Screening Complete  Medication Review (RN CM) Complete

## 2024-09-07 NOTE — TOC PASRR Note (Signed)
 DOB: [birth year] Date: 09/07/2024   Must ID: [id number]   To Whom it May Concern:  Please be advised that the above named patient will require a short-term nursing home stay- anticipated 30 days or less rehabilitation and strengthening. The plan is for return home.

## 2024-09-07 NOTE — Plan of Care (Signed)
   Problem: Education: Goal: Knowledge of General Education information will improve Description: Including pain rating scale, medication(s)/side effects and non-pharmacologic comfort measures Outcome: Progressing   Problem: Clinical Measurements: Goal: Will remain free from infection Outcome: Progressing   Problem: Activity: Goal: Risk for activity intolerance will decrease Outcome: Progressing

## 2024-09-07 NOTE — Care Management Obs Status (Signed)
 MEDICARE OBSERVATION STATUS NOTIFICATION   Patient Details  Name: [name] MRN: [id number] Date of Birth: 1951-07-10   Medicare Observation Status Notification Given:  Yes    Duwaine LITTIE Ada 09/07/2024, 4:46 PM

## 2024-09-07 NOTE — TOC Progression Note (Signed)
 Transition of Care [facility]) - Progression Note    Patient Details  Name: [name] MRN: [id number] Date of Birth: 1951-08-16  Transition [facility]) CM/SW Contact  Mcarthur Saddie Kim, [place] Phone Number: 09/07/2024, 1:08 PM  Clinical Narrative: Pt accepts bed offer at [facility]. Facility notified. Will update auth.       Expected Discharge Plan: Skilled Nursing Facility Barriers to Discharge: No SNF bed, Insurance Authorization               Expected Discharge Plan and Services In-house Referral: Clinical Social Work   Post Acute Care Choice: Skilled Nursing Facility Living arrangements for the past 2 months: Single Family Home                                       Social Drivers of Health (SDOH) Interventions SDOH Screenings   Food Insecurity: No Food Insecurity (09/06/2024)  Housing: High Risk (09/06/2024)  Transportation Needs: No Transportation Needs (09/06/2024)  Utilities: Not At Risk (09/06/2024)  Depression (PHQ2-9): Medium Risk (06/24/2024)  Financial Resource Strain: Low Risk  (08/11/2020)  Physical Activity: Insufficiently Active (08/11/2020)  Social Connections: Moderately Integrated (09/06/2024)  Stress: Stress Concern Present (08/11/2020)  Tobacco Use: High Risk (09/06/2024)    Readmission Risk Interventions    03/10/2024    7:54 AM  Readmission Risk Prevention Plan  Transportation Screening Complete  Home Care Screening Complete  Medication Review (RN CM) Complete

## 2024-09-07 NOTE — Plan of Care (Signed)

## 2024-09-07 NOTE — NC FL2 (Signed)
 [place]  MEDICAID FL2 LEVEL OF CARE FORM     IDENTIFICATION  Patient Name: [name] Birthdate: 1951/03/20 Sex: female Admission Date (Current Location): 09/06/2024  [facility] and [place] Number:  [employer] and Address:  [facility],  [address], [place] [address]      Provider Number: [id number]  Attending Physician Name and Address:  Ricky Fines, MD  Relative Name and Phone Number:       Current Level of Care: Hospital Recommended Level of Care: Skilled Nursing Facility Prior Approval Number:    Date Approved/Denied:   PASRR Number: pending  Discharge Plan: SNF    Current Diagnoses: Patient Active Problem List   Diagnosis Date Noted   Falls 09/06/2024   Bilateral pubic rami fractures (HCC) 09/06/2024   Chronic pain syndrome 06/24/2024   Acute respiratory failure with hypoxia (HCC) 06/12/2024   HLD (hyperlipidemia) 06/12/2024   Seizure disorder (HCC) 06/12/2024   History of CVA (cerebrovascular accident) 06/12/2024   History of breast cancer 06/12/2024   Bipolar disorder (HCC) 06/12/2024   Acute hypoxic respiratory failure (HCC) 03/10/2024   Acute exacerbation of COPD with asthma (HCC) 03/09/2024   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)    S/P right hip fracture    Acute blood loss as cause of postoperative anemia    Closed fracture of right hip (HCC) s/p compression fixation 04/25/19 04/24/2019   Leukocytosis 04/24/2019   COPD (chronic obstructive pulmonary disease) (HCC) 05/30/2017   Colitis 08/21/2016   Adrenal mass, left 08/21/2016   Solitary pulmonary nodule 08/21/2016   Tobacco use disorder 08/21/2016   GERD (gastroesophageal reflux disease) 06/13/2010   IRRITABLE BOWEL SYNDROME 06/13/2010   HEMATOCHEZIA 06/13/2010   WEIGHT LOSS, ABNORMAL 06/13/2010   ABDOMINAL PAIN, GENERALIZED 06/13/2010    Orientation RESPIRATION BLADDER Height & Weight     Self, Time, Situation, Place   Normal Continent Weight: 110 lb 14.3 oz (50.3 kg) Height:  5' 3.5 (161.3 cm)  BEHAVIORAL SYMPTOMS/MOOD NEUROLOGICAL BOWEL NUTRITION STATUS    Convulsions/Seizures (history) Continent Diet (See d/c summary)  AMBULATORY STATUS COMMUNICATION OF NEEDS Skin   Extensive Assist Verbally Skin abrasions, Bruising                       Personal Care Assistance Level of Assistance  Feeding, Bathing, Dressing Bathing Assistance: Maximum assistance Feeding assistance: Limited assistance Dressing Assistance: Maximum assistance     Functional Limitations Info  Sight, Hearing, Speech Sight Info: Impaired Hearing Info: Impaired Speech Info: Adequate    SPECIAL CARE FACTORS FREQUENCY  PT (By licensed PT)     PT Frequency: 5x weekly              Contractures      Additional Factors Info  Code Status, Allergies, Psychotropic Code Status Info: Full Allergies Info: Varenicline  Codeine  Haemophilus B Polysaccharide Vaccine  Influenza Virus Vaccine  Lithium  Psychotropic Info: Klonopin , Ambien , Cymbalta          Current Medications (09/07/2024):  This is the current hospital active medication list Current Facility-Administered Medications  Medication Dose Route Frequency Provider Last Rate Last Admin   acetaminophen  (TYLENOL ) tablet 1,000 mg  1,000 mg Oral Q8H Ricky Fines, MD   1,000 mg at 09/07/24 9050   albuterol  (PROVENTIL ) (2.5 MG/3ML) 0.083% nebulizer solution 2.5 mg  2.5 mg Nebulization Q4H PRN Emokpae, Ejiroghene E, MD       budesonide -glycopyrrolate -formoterol  (BREZTRI ) 160-9-4.8 MCG/ACT inhaler 2 puff  2 puff Inhalation BID Emokpae, Ejiroghene E, MD   2 puff at 09/07/24 0831   clonazePAM  (KLONOPIN ) tablet 0.5 mg  0.5 mg Oral Q12H PRN Ricky Fines, MD       enoxaparin  (LOVENOX ) injection 40 mg  40 mg Subcutaneous Q24H Emokpae, Ejiroghene E, MD   40 mg at 09/06/24 2023   melatonin tablet 6 mg  6 mg Oral QHS PRN Shona Terry SAILOR, DO       morphine  (PF) 2 MG/ML injection 2 mg   2 mg Intravenous Q4H PRN Ricky Fines, MD       oxyCODONE  (Oxy IR/ROXICODONE ) immediate release tablet 5 mg  5 mg Oral Q6H PRN Ricky Fines, MD       pantoprazole  (PROTONIX ) EC tablet 40 mg  40 mg Oral Daily Emokpae, Ejiroghene E, MD   40 mg at 09/07/24 0931   phenytoin  (DILANTIN ) ER capsule 400 mg  400 mg Oral QHS Emokpae, Ejiroghene E, MD   400 mg at 09/06/24 2115   polyethylene glycol (MIRALAX  / GLYCOLAX ) packet 17 g  17 g Oral Daily PRN Shona Terry N, DO       prochlorperazine (COMPAZINE) injection 5 mg  5 mg Intravenous Q6H PRN Hall, Carole N, DO       tamoxifen  (NOLVADEX ) tablet 20 mg  20 mg Oral Daily Emokpae, Ejiroghene E, MD   20 mg at 09/07/24 9068     Discharge Medications: Please see discharge summary for a list of discharge medications.  Relevant Imaging Results:  Relevant Lab Results:   Additional Information SSN: [id number]  Mcarthur Saddie Kim, LCSW

## 2024-09-07 NOTE — Evaluation (Signed)
 Physical Therapy Evaluation Patient Details Name: [name] MRN: [id number] DOB: 26-Jun-1951 Today's Date: 09/07/2024  History of Present Illness  [name] is a 73 y.o. female with medical history significant for COPD, bipolar disorder, seizure disorder, chronic pain, alcohol abuse, stroke.  Patient presented to the ED with reports of a fall today, at about 11 AM, patient fell from bed.  She has had several falls.  She ambulates with walker or cane.  She has bruises to her lower extremities which she attributes to her dog.  She reports good oral intake, no vomiting no diarrhea no dizziness.  She reports pain to her back, hips, legs, and that she is unable to ambulate.     Clinical Impression  Pt. Presented with general weakness in LE and NWB in RLE. Pt was able to perform bed mobility w/ supervision to EOB, Pt required Mod A w/Stand pivot from bed to chair w/ RW. Pt felt fatigued and dizzy at EOB during transfer. Pt needed cuing to not WB on RLE during transfer from sit to stand. At the end of the session, pt was left in chair w. Call bell. Nursing staff was notified on pt status. Patient will benefit from continued skilled physical therapy in hospital and recommended venue below to increase strength, balance, endurance for safe ADLs and gait.       If plan is discharge home, recommend the following: A little help with walking and/or transfers;Assist for transportation;Assistance with cooking/housework;A little help with bathing/dressing/bathroom   Can travel by private vehicle   Yes    Equipment Recommendations None recommended by PT  Recommendations for Other Services       Functional Status Assessment Patient has had a recent decline in their functional status and demonstrates the ability to make significant improvements in function in a reasonable and predictable amount of time.     Precautions / Restrictions Precautions Precautions: Fall Recall of  Precautions/Restrictions: Intact Restrictions Weight Bearing Restrictions Per Provider Order: Yes RLE Weight Bearing Per Provider Order: Non weight bearing Other Position/Activity Restrictions: R. Hip Fx      Mobility  Bed Mobility Overal bed mobility: Needs Assistance, Modified Independent Bed Mobility: Sit to Supine       Sit to supine: Supervision   General bed mobility comments: to EOB    Transfers Overall transfer level: Needs assistance Equipment used: Rolling walker (2 wheels) Transfers: Sit to/from Stand, Bed to chair/wheelchair/BSC Sit to Stand: Mod assist Stand pivot transfers: Mod assist         General transfer comment: w/ RW, and NWB on RLE    Ambulation/Gait                  Stairs            Wheelchair Mobility     Tilt Bed    Modified Rankin (Stroke Patients Only)       Balance Overall balance assessment: Needs assistance Sitting-balance support: Feet supported, No upper extremity supported Sitting balance-Leahy Scale: Good Sitting balance - Comments: Good/ Fair to EOB   Standing balance support: During functional activity, Reliant on assistive device for balance Standing balance-Leahy Scale: Fair Standing balance comment: Fair/ Poor w/ RW NWB RLE                             Pertinent Vitals/Pain Pain Assessment Pain Assessment: 0-10 Pain Score: 2  Pain Location: R. Groin/hip region Pain Descriptors /  Indicators: Discomfort Pain Intervention(s): Limited activity within patient's tolerance, Monitored during session, Repositioned    Home Living Family/patient expects to be discharged to:: Private residence Living Arrangements: Spouse/significant other;Children Available Help at Discharge: Family Type of Home: Mobile home Home Access: Stairs to enter Entrance Stairs-Rails: Doctor, General Practice of Steps: 3   Home Layout: One level Home Equipment: Cane - quad;Cane - single point;Hospital  bed;BSC/3in1;Rolling Walker (2 wheels) Additional Comments: Pt. uses BSC for bathing the most, and uses RW for community ambulation.    Prior Function Prior Level of Function : Needs assist       Physical Assist : Mobility (physical) Mobility (physical): Transfers;Gait;Stairs   Mobility Comments: Son was able helping with ambulation, and transfer from bed to chair at home ADLs Comments: Pt. is able to go shopping, and bath, groom themselves     Extremity/Trunk Assessment        Lower Extremity Assessment Lower Extremity Assessment: Generalized weakness    Cervical / Trunk Assessment Cervical / Trunk Assessment: Kyphotic  Communication   Communication Communication: No apparent difficulties    Cognition Arousal: Alert Behavior During Therapy: WFL for tasks assessed/performed   PT - Cognitive impairments: No apparent impairments                         Following commands: Intact       Cueing Cueing Techniques: Verbal cues, Tactile cues     General Comments      Exercises     Assessment/Plan    PT Assessment Patient needs continued PT services  PT Problem List Decreased strength;Decreased range of motion;Decreased activity tolerance;Decreased balance;Decreased mobility;Decreased coordination       PT Treatment Interventions DME instruction;Gait training;Stair training;Functional mobility training;Therapeutic activities;Therapeutic exercise;Balance training;Patient/family education    PT Goals (Current goals can be found in the Care Plan section)  Acute Rehab PT Goals Patient Stated Goal: Pt. wants to return home PT Goal Formulation: With patient Time For Goal Achievement: 09/18/24 Potential to Achieve Goals: Good    Frequency Min 3X/week     Co-evaluation               AM-PAC PT 6 Clicks Mobility  Outcome Measure Help needed turning from your back to your side while in a flat bed without using bedrails?: A Little Help needed  moving from lying on your back to sitting on the side of a flat bed without using bedrails?: A Little Help needed moving to and from a bed to a chair (including a wheelchair)?: A Little Help needed standing up from a chair using your arms (e.g., wheelchair or bedside chair)?: A Lot Help needed to walk in hospital room?: A Lot Help needed climbing 3-5 steps with a railing? : Total 6 Click Score: 14    End of Session   Activity Tolerance: Patient tolerated treatment well;Patient limited by pain Patient left: in chair;with call bell/phone within reach Nurse Communication: Mobility status PT Visit Diagnosis: Muscle weakness (generalized) (M62.81);History of falling (Z91.81);Dizziness and giddiness (R42)    Time: 8962-8897 PT Time Calculation (min) (ACUTE ONLY): 25 min   Charges:   PT Evaluation $PT Eval Moderate Complexity: 1 Mod PT Treatments $Therapeutic Activity: 23-37 mins PT General Charges $$ ACUTE PT VISIT: 1 Visit        Collier Bohnet, SPT

## 2024-09-08 DIAGNOSIS — S3282XA Multiple fractures of pelvis without disruption of pelvic ring, initial encounter for closed fracture: Secondary | ICD-10-CM | POA: Diagnosis not present

## 2024-09-08 DIAGNOSIS — G40909 Epilepsy, unspecified, not intractable, without status epilepticus: Secondary | ICD-10-CM | POA: Diagnosis not present

## 2024-09-08 DIAGNOSIS — Z853 Personal history of malignant neoplasm of breast: Secondary | ICD-10-CM | POA: Diagnosis not present

## 2024-09-08 DIAGNOSIS — R296 Repeated falls: Secondary | ICD-10-CM | POA: Diagnosis not present

## 2024-09-08 DIAGNOSIS — G894 Chronic pain syndrome: Secondary | ICD-10-CM

## 2024-09-08 MED ORDER — ACETAMINOPHEN 500 MG PO TABS: 1000.0000 mg | ORAL_TABLET | Freq: Three times a day (TID) | ORAL | Status: AC

## 2024-09-08 MED ORDER — POLYETHYLENE GLYCOL 3350 17 G PO PACK: 17.0000 g | PACK | Freq: Every day | ORAL | 0 refills | Status: AC | PRN

## 2024-09-08 MED ORDER — METHOCARBAMOL 500 MG PO TABS: 500.0000 mg | ORAL_TABLET | Freq: Three times a day (TID) | ORAL | 0 refills | Status: AC | PRN

## 2024-09-08 MED ORDER — CLONAZEPAM 0.5 MG PO TABS: 0.5000 mg | ORAL_TABLET | Freq: Two times a day (BID) | ORAL | Status: DC | PRN

## 2024-09-08 NOTE — Discharge Summary (Signed)
 Physician Discharge Summary   Patient: [name] MRN: [id number] DOB: 11-Aug-1951  Admit date:     09/06/2024  Discharge date: 09/08/24  Discharge Physician: Eric Nunnery   PCP: Bevely Doffing, FNP   Recommendations at discharge:  Repeat basic metabolic panel to follow ultralights renal function Repeat CBC to follow hemoglobin trend/stability. Continue to assess ongoing pain with further adjustment to analgesic therapy as required. Outpatient follow-up with orthopedic service in 2-3 weeks as an outpatient to repeat images and further determine treatment.  Discharge Diagnoses: Principal Problem:   Falls Active Problems:   Bilateral pubic rami fractures (HCC)   COPD (chronic obstructive pulmonary disease) (HCC)   Seizure disorder (HCC)   History of CVA (cerebrovascular accident)   History of breast cancer   Bipolar disorder (HCC)   Chronic pain syndrome   Multiple closed pelvic fractures without disruption of pelvic circle [facility])  Brief Hospital admission narrative: As per H&P written by Dr. Pearlean on 09/06/2024 [name] is a 73 y.o. female with medical history significant for COPD, bipolar disorder, seizure disorder, chronic pain, alcohol abuse, stroke. Patient presented to the ED with reports of a fall today, at about 11 AM, patient fell from bed.  She has had several falls.  She ambulates with walker or cane.  She has bruises to her lower extremities which she attributes to her dog.  She reports good oral intake, no vomiting no diarrhea no dizziness.  She reports pain to her back, hips, legs, and that she is unable to ambulate.   ED Course: Stable vitals.  Potassium 3.4.  Troponin less than 15 x 2.  WBC 14.1. - CT lumbar spine without contrast-no acute abnormality, shows grade 1 anterolisthesis of L4 on L5 similar to prior - Head CT, cervical CT, thoracic CT negative for acute abnormality - Port chest x-ray, right knee x-ray, pelvic x-ray-negative for acute  abnormality. - CT pelvis without contrast-acute minimally displaced fracture of the right superior pubic rami extending to the pubic symphysis, acute nondisplaced fracture of the base of the left superior pubic rami at the anterior acetabulum, acute minimally displaced fracture of the left inferior pubic rami. EDP talked to orthopedist on-call Dr. Delbra fractures, recommend none-weightbearing. Patient unable to ambulate with persistent pain.  Hospitalization requested patient may need rehab.  Assessment and Plan: 1-falls/pubic rami fracture - No surgical intervention needed - Case discussed with orthopedic service - Continue weightbearing as tolerated, physical conditioning and as needed pain medications/muscle relaxant treatment. -Patient will follow-up with orthopedic service in 2-3 weeks to repeat x-ray images and determine any further management intervention. -PT evaluation/assessment has been done with recommendations for skilled nursing facility at discharge to pursuit rehabilitation; patient has declined nursing home and would like to go home with family care and home health PT.   2-history of seizures/stroke - No focal neurologic deficits - Continue risk factors modification - Continue phenytoin .   3-history of breast cancer - Continue tamoxifen . -Continue patient follow-up/screening oncology service discretion.   4-bipolar disorder - No suicidal ideation or hallucination - Continue home psychotropic agents. - Continue patient follow up with psychiatry service.   5-history of COPD - Stable and currently compensated - Continue bronchodilator management - Good saturation on room air appreciated.   6-hypokalemia - Repleted and stabilized at discharge - Continue to follow ultralights trend intermittently.  Consultants: Orthopedic service Procedures performed: See below for x-ray reports. Disposition: Home with home health services. Diet recommendation: Heart  healthy diet.  DISCHARGE MEDICATION: Allergies  as of 09/08/2024       Reactions   Varenicline Hives   Codeine Nausea And Vomiting   Haemophilus B Polysaccharide Vaccine Nausea And Vomiting   Influenza Virus Vaccine Nausea And Vomiting   Lithium  Nausea And Vomiting        Medication List     STOP taking these medications    tiZANidine 2 MG tablet Commonly known as: ZANAFLEX       TAKE these medications    acetaminophen  500 MG tablet Commonly known as: TYLENOL  Take 2 tablets (1,000 mg total) by mouth every 8 (eight) hours. What changed:  how much to take when to take this reasons to take this   albuterol  108 (90 Base) MCG/ACT inhaler Commonly known as: VENTOLIN  HFA Inhale 2 puffs into the lungs every 6 (six) hours as needed for wheezing or shortness of breath.   albuterol  (2.5 MG/3ML) 0.083% nebulizer solution Commonly known as: PROVENTIL  Take 3 mLs (2.5 mg total) by nebulization every 4 (four) hours as needed for wheezing or shortness of breath.   atorvastatin  20 MG tablet Commonly known as: LIPITOR Take 1 tablet (20 mg total) by mouth every evening.   Breztri  Aerosphere 160-9-4.8 MCG/ACT Aero inhaler Generic drug: budesonide -glycopyrrolate -formoterol  Inhale 2 puffs into the lungs 2 (two) times daily.   carboxymethylcellulose 0.5 % Soln Commonly known as: REFRESH PLUS Place 1 drop into both eyes 3 (three) times daily as needed (dry/irritated eyes.).   clonazePAM  0.5 MG tablet Commonly known as: KLONOPIN  Take 1 tablet (0.5 mg total) by mouth 2 (two) times daily as needed for anxiety. What changed: Another medication with the same name was removed. Continue taking this medication, and follow the directions you see here.   dicyclomine  10 MG capsule Commonly known as: BENTYL  Take 10 mg by mouth 2 (two) times daily.   DULoxetine  20 MG capsule Commonly known as: CYMBALTA  Take 20 mg by mouth daily.   hydrOXYzine 25 MG tablet Commonly known as:  ATARAX Take 25 mg by mouth 2 (two) times daily.   lidocaine  5 % Commonly known as: Lidoderm  Place 1 patch onto the skin daily as needed. Remove & Discard patch within 12 hours or as directed by MD   methocarbamol  500 MG tablet Commonly known as: ROBAXIN  Take 1 tablet (500 mg total) by mouth every 8 (eight) hours as needed for muscle spasms.   multivitamin with minerals Tabs tablet Take 1 tablet by mouth 3 (three) times a week.   omeprazole  40 MG capsule Commonly known as: PRILOSEC Take 1 capsule (40 mg total) by mouth daily.   Oxycodone  HCl 10 MG Tabs Take 10 mg by mouth 4 (four) times daily.   phenytoin  100 MG ER capsule Commonly known as: DILANTIN  Take 400 mg by mouth at bedtime.   polyethylene glycol 17 g packet Commonly known as: MIRALAX  / GLYCOLAX  Take 17 g by mouth daily as needed for mild constipation.   tamoxifen  20 MG tablet Commonly known as: NOLVADEX  TAKE ONE TABLET BY MOUTH ONCE DAILY.   traZODone  150 MG tablet Commonly known as: DESYREL  Take 300 mg by mouth daily.   valACYclovir 1000 MG tablet Commonly known as: VALTREX Take 1 tablet (1,000 mg total) by mouth 3 (three) times daily for 7 days.   VICKS VAPOINHALER IN Inhale 1 puff into the lungs daily as needed (congestion).        Follow-up Information     Bevely Doffing, FNP. Schedule an appointment as soon as possible for a visit in  10 day(s).   Specialty: Family Medicine Contact information: [address] [place] [address] [phone]                Discharge Exam: Fredricka Weights   09/06/24 1251  Weight: 50.3 kg   General exam: Alert, awake, oriented x 3; following commands appropriately. Respiratory system: Clear to auscultation. Respiratory effort normal. Cardiovascular system:RRR. No murmurs, rubs, gallops. Gastrointestinal system: Abdomen is nondistended, soft and nontender. No organomegaly or masses felt. Normal bowel sounds heard. Central nervous system: No  focal neurological deficits. Extremities: No cyanosis or clubbing.  Decreased range of motion in her lower extremities due to pain. Skin: No petechiae. Psychiatry: Mood & affect appropriate.   Condition at discharge: Stable.  The results of significant diagnostics from this hospitalization (including imaging, microbiology, ancillary and laboratory) are listed below for reference.   Imaging Studies: CT PELVIS WO CONTRAST Result Date: 09/06/2024 EXAM: CT PELVIS, WITHOUT IV CONTRAST 09/06/2024 04:08:46 PM TECHNIQUE: Axial images were acquired through the pelvis without IV contrast. Reformatted images were reviewed. Automated exposure control, iterative reconstruction, and/or weight based adjustment of the mA/kV was utilized to reduce the radiation dose to as low as reasonably achievable. COMPARISON: None available. CLINICAL HISTORY: fall, diffuse pelvic pain FINDINGS: BONES: Acute minimally displaced fracture of the right superior pubic ramus extending into the pubic symphysis. Acute nondisplaced fracture of the base of the left superior pubic ramus/anterior acetabulum. Acute minimally displaced fracture of the left inferior pubic ramus. Fixation right femur. JOINTS: The right superior pubic ramus fracture extends into the pubic symphysis. The left superior pubic ramus fracture is at the anterior acetabulum. No dislocation. SOFT TISSUES: The soft tissues are unremarkable. INTRAPELVIC CONTENTS: Limited images of the intrapelvic contents demonstrate no acute abnormality. IMPRESSION: 1. Acute minimally displaced fracture of the right superior pubic ramus extending into the pubic symphysis. 2. Acute nondisplaced fracture of the base of the left superior pubic ramus at the anterior acetabulum. 3. Acute minimally displaced fracture of the left inferior pubic ramus. Electronically signed by: Norman Gatlin MD 09/06/2024 04:19 PM EST RP Workstation: [id number]   DG HIP UNILAT WITH PELVIS 2-3 VIEWS LEFT Result  Date: 09/06/2024 EXAM: 2 or 3 VIEW(S) XRAY OF THE LEFT HIP 09/06/2024 02:05:00 PM COMPARISON: CT abdomen/pelvis 09/04/2024 and hip radiographs 01/28/2024. CLINICAL HISTORY: Fall. Pain. FINDINGS: BONES AND JOINTS: Diffuse osseous demineralization. No acute fracture or dislocation of the left hip. 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 Mild osteoarthritis of the left hip. SOFT TISSUES: No acute abnormality. Peripheral vascular calcifications. IMPRESSION: 1. No acute fracture or dislocation of the left hip. 2. Chronic avulsion fracture of the left greater trochanter, better evaluated on prior CT. Electronically signed by: Harrietta Sherry MD 09/06/2024 02:44 PM EST RP Workstation: [id number]   DG HIP UNILAT WITH PELVIS 2-3 VIEWS RIGHT Result Date: 09/06/2024 EXAM: 2 or 3 VIEW(S) XRAY OF THE RIGHT HIP 09/06/2024 02:05:00 PM COMPARISON: CT abdomen/pelvis dated 09/04/2024 and hip radiographs dated 01/28/2024. CLINICAL HISTORY: Fall. Pain. FINDINGS: BONES AND JOINTS: Diffuse osseous demineralization. Evaluation of the sacrum is limited due to overlying bowel gas. No acute fracture or dislocation. Prior ORIF of the right proximal femur with unchanged alignment. Hardware is intact. Mild osteoarthritis of the right hip. Degenerative changes of the visualized lower lumbar spine. SOFT TISSUES: The soft tissues are unremarkable. IMPRESSION: 1. No acute fracture or dislocation of the right hip. 2. Prior ORIF of the right proximal femur  with unchanged alignment and intact hardware. 3. Diffuse osseous demineralization. 4. Degenerative changes of the visualized lower lumbar spine. Electronically signed by: Harrietta Sherry MD 09/06/2024 02:40 PM EST RP Workstation: [id number]   CT THORACIC SPINE WO CONTRAST Result Date: 09/06/2024 EXAM: CT THORACIC SPINE WITHOUT CONTRAST 09/06/2024 02:07:00 PM TECHNIQUE: CT of the thoracic spine was performed without the  administration of intravenous contrast. Multiplanar reformatted images are provided for review. Automated exposure control, iterative reconstruction, and/or weight based adjustment of the mA/kV was utilized to reduce the radiation dose to as low as reasonably achievable. COMPARISON: None available. CLINICAL HISTORY: FINDINGS: BONES AND ALIGNMENT: Normal vertebral body heights. No acute fracture or suspicious bone lesion. Normal alignment. DEGENERATIVE CHANGES: No severe degenerative changes. SOFT TISSUES: No acute abnormality. IMPRESSION: 1. No acute abnormality of the thoracic spine. Electronically signed by: Norman Gatlin MD 09/06/2024 02:37 PM EST RP Workstation: [id number]   DG Knee Complete 4 Views Right Result Date: 09/06/2024 EXAM: 4 VIEW(S) XRAY OF THE RIGHT KNEE 09/06/2024 02:05:00 PM COMPARISON: Right femur radiographs 10/14/2023. CLINICAL HISTORY: fall diffuse pain FINDINGS: BONES AND JOINTS: Diffuse osseous demineralization. No acute fracture or dislocation. No significant joint effusion. Mild tricompartmental osteoarthritis. SOFT TISSUES: Peripheral vascular calcifications. The soft tissues are otherwise unremarkable. IMPRESSION: 1. No acute osseous abnormality. 2. Mild tricompartmental osteoarthritis. Electronically signed by: Harrietta Sherry MD 09/06/2024 02:35 PM EST RP Workstation: [id number]   DG Chest Portable 1 View Result Date: 09/06/2024 EXAM: 1 VIEW(S) XRAY OF THE CHEST 09/06/2024 02:05:00 PM COMPARISON: 08/25/2024. CLINICAL HISTORY: fall diffuse pain FINDINGS: LUNGS AND PLEURA: No focal pulmonary opacity. No overt pulmonary edema. No pleural effusion. No pneumothorax. HEART AND MEDIASTINUM: Aortic atherosclerosis. Stable cardiomediastinal contours. BONES AND SOFT TISSUES: Right axillary surgical clips. No acute osseous abnormality. IMPRESSION: 1. No acute cardiopulmonary findings. Electronically signed by: Harrietta Sherry MD 09/06/2024 02:33 PM EST RP Workstation: [id number]   CT  LUMBAR SPINE WO CONTRAST Result Date: 09/06/2024 EXAM: CT OF THE LUMBAR SPINE WITHOUT CONTRAST 09/06/2024 01:53:14 PM TECHNIQUE: CT of the lumbar spine was performed without the administration of intravenous contrast. Multiplanar reformatted images are provided for review. Automated exposure control, iterative reconstruction, and/or weight based adjustment of the mA/kV was utilized to reduce the radiation dose to as low as reasonably achievable. COMPARISON: MRI lumbar spine 12/28/2022. CLINICAL HISTORY: FINDINGS: BONES AND ALIGNMENT: Normal vertebral body heights. Grade 1 anterolisthesis of L4 on L5 is similar to prior. Lumbar lordosis is maintained. Diffuse osteopenia. There is asymmetric prominence of osteopenia within the sacral ala slightly greater on the right. No acute fracture or suspicious bone lesion. DEGENERATIVE CHANGES: Degenerative changes of the bilateral sacroiliac joints. Degenerative endplate osteophytes of multiple levels. Mild disc space narrowing at L5-S1. There are small disc bulges at multiple levels. Facet arthrosis throughout the lumbar spine. There is mild bilateral foraminal stenosis at L4-L5. Moderate foraminal stenosis bilaterally at L5-S1. No high grade osseous spinal canal stenosis. SOFT TISSUES: The visualized paraspinal soft tissues are unremarkable. There is extensive atherosclerosis of the abdominal aorta and branch vessels. Sequelae of cholecystectomy. IMPRESSION: 1. No acute findings. 2. Grade 1 anterolisthesis of L4 on L5, similar to prior study. 3. Degenerative changes as above. 4. Diffuse osteopenia with asymmetric prominence within the sacral ala, slightly greater on the right. Electronically signed by: Donnice Mania MD 09/06/2024 02:24 PM EST RP Workstation: [id number]   CT Cervical Spine Wo Contrast Result Date: 09/06/2024 EXAM: CT CERVICAL SPINE WITHOUT CONTRAST 09/06/2024 01:53:32 PM TECHNIQUE: CT of the cervical spine was performed without  the administration of  intravenous contrast. Multiplanar reformatted images are provided for review. Automated exposure control, iterative reconstruction, and/or weight based adjustment of the mA/kV was utilized to reduce the radiation dose to as low as reasonably achievable. COMPARISON: 01/09/2024 CLINICAL HISTORY: Ataxia, cervical trauma. FINDINGS: CERVICAL SPINE: BONES AND ALIGNMENT: Similar trace degenerative retrolisthesis of C3 on C4. Alignment is otherwise maintained. No evidence of traumatic malalignment. No acute fracture. DEGENERATIVE CHANGES: Disc space narrowing most pronounced posteriorly at C6-C7. Disc osteophyte complexes at multiple levels without evidence of high grade osseous spinal canal stenosis. Facet arthrosis and uncovertebral hypertrophy at multiple levels. Foraminal stenosis greatest at C3-C4 and C6-C7. SOFT TISSUES: No prevertebral soft tissue swelling. Atherosclerosis at the carotid bifurcations. INCIDENTAL LUNG FINDINGS: Emphysema in the lung apices. IMPRESSION: 1. No acute abnormality of the cervical spine. 2. Degenerative changes as above. 3. Incidental emphysema in the lung apices; for patients aged 74, consider evaluation for a low-dose CT lung cancer screening program. Electronically signed by: Donnice Mania MD 09/06/2024 02:17 PM EST RP Workstation: [id number]   CT Head Wo Contrast Result Date: 09/06/2024 EXAM: CT HEAD WITHOUT CONTRAST 09/06/2024 01:53:32 PM TECHNIQUE: CT of the head was performed without the administration of intravenous contrast. Automated exposure control, iterative reconstruction, and/or weight based adjustment of the mA/kV was utilized to reduce the radiation dose to as low as reasonably achievable. COMPARISON: 03/09/2024 CLINICAL HISTORY: Head trauma, minor (Age >= 65y). FINDINGS: BRAIN AND VENTRICLES: No acute hemorrhage. No evidence of acute infarct. Proportional prominence of the ventricles and sulci, consistent with diffuse cerebral parenchymal volume loss. Scattered  periventricular white matter hypoattenuation, consistent with mild chronic ischemic microvascular disease. No hydrocephalus. No extra-axial collection. No mass effect or midline shift. Calcified atherosclerotic plaque within cavernous/supraclinoid ICA and intradural vertebral arteries. ORBITS: Bilateral lens replacement noted. SINUSES: No acute abnormality. SOFT TISSUES AND SKULL: No acute soft tissue abnormality. No skull fracture. IMPRESSION: 1. No acute intracranial abnormality. 2. Mild cerebral parenchymal volume loss and mild chronic ischemic microvascular disease. Electronically signed by: Donnice Mania MD 09/06/2024 02:12 PM EST RP Workstation: [id number]   CT ABDOMEN PELVIS W CONTRAST Result Date: 09/04/2024 EXAM: CT ABDOMEN AND PELVIS WITH CONTRAST 09/04/2024 10:03:00 AM TECHNIQUE: CT of the abdomen and pelvis was performed with the administration of 80 mL of iohexol  (OMNIPAQUE ) 300 MG/ML solution. Multiplanar reformatted images are provided for review. Automated exposure control, iterative reconstruction, and/or weight-based adjustment of the mA/kV was utilized to reduce the radiation dose to as low as reasonably achievable. COMPARISON: 06/12/2024 and previous. CLINICAL HISTORY: abd pain, RUQ, RLQ FINDINGS: LOWER CHEST: Minimal linear scarring or atelectasis posteriorly at the lung bases, left greater than right. LIVER: The liver is unremarkable. GALLBLADDER AND BILE DUCTS: Cholecystectomy clips. Stable ectatic extrahepatic biliary tree and mild central intrahepatic biliary ductal dilatation. SPLEEN: No acute abnormality. PANCREAS: No acute abnormality. 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 KIDNEYS, URETERS AND BLADDER: No stones in the kidneys or ureters. No hydronephrosis. No perinephric or periureteral stranding. Urinary bladder is unremarkable. GI AND BOWEL: Stomach demonstrates no acute abnormality. A few sigmoid diverticula  without adjacent inflammatory/edema change. There is no bowel obstruction. PERITONEUM AND RETROPERITONEUM: No ascites. No free air. VASCULATURE: Aorta is normal in caliber. Moderately heavy aortoiliac calcified plaque without an aneurysm. LYMPH NODES: No lymphadenopathy. REPRODUCTIVE ORGANS: Post hysterectomy. BONES AND SOFT TISSUES: Fixation hardware across the right femoral neck. Mild hip degenerative joint disease, right greater than left. Chronic avulsion deformity  of the left greater trochanter. Mild lumbar facet degenerative joint disease L3-S1. No acute osseous abnormality. No focal soft tissue abnormality. IMPRESSION: 1. No acute findings in the abdomen or pelvis. 2. Stable ectatic extrahepatic biliary tree and mild central intrahepatic biliary ductal dilatation, post cholecystectomy. Electronically signed by: Katheleen Faes MD 09/04/2024 10:54 AM EST RP Workstation: [id number]   DG Chest Port 1 View Result Date: 08/25/2024 EXAM: 1 VIEW(S) XRAY OF THE CHEST 08/25/2024 10:26:00 AM COMPARISON: 06/12/2024 CLINICAL HISTORY: dyspnea. Pt arrived via [facility] from home c/o shob and cough x few days. Current everyday smoker FINDINGS: LUNGS AND PLEURA: Small right basilar subsegmental atelectasis is noted. No pulmonary edema. No pleural effusion. No pneumothorax. HEART AND MEDIASTINUM: No acute abnormality of the cardiac and mediastinal silhouettes. BONES AND SOFT TISSUES: No acute osseous abnormality. IMPRESSION: 1. Small right basilar subsegmental atelectasis. Electronically signed by: Lynwood Seip MD 08/25/2024 10:44 AM EDT RP Workstation: [id number]    Microbiology: Results for orders placed or performed during the hospital encounter of 08/25/24  Resp panel by RT-PCR (RSV, Flu A&B, Covid) Anterior Nasal Swab     Status: None   Collection Time: 08/25/24 10:26 AM   Specimen: Anterior Nasal Swab  Result Value Ref Range Status   SARS Coronavirus 2 by RT PCR NEGATIVE NEGATIVE Final    Comment:  (NOTE) SARS-CoV-2 target nucleic acids are NOT DETECTED.  The SARS-CoV-2 RNA is generally detectable in upper respiratory specimens during the acute phase of infection. The lowest concentration of SARS-CoV-2 viral copies this assay can detect is 138 copies/mL. A negative result does not preclude SARS-Cov-2 infection and should not be used as the sole basis for treatment or other patient management decisions. A negative result may occur with  improper specimen collection/handling, submission of specimen other than nasopharyngeal swab, presence of viral mutation(s) within the areas targeted by this assay, and inadequate number of viral copies(<138 copies/mL). A negative result must be combined with clinical observations, patient history, and epidemiological information. The expected result is Negative.  Fact Sheet for Patients:  bloggercourse.com  Fact Sheet for Healthcare Providers:  seriousbroker.it  This test is no t yet approved or cleared by the [place]  FDA and  has been authorized for detection and/or diagnosis of SARS-CoV-2 by FDA under an Emergency Use Authorization (EUA). This EUA will remain  in effect (meaning this test can be used) for the duration of the COVID-19 declaration under Section 564(b)(1) of the Act, 21 U.S.C.section 360bbb-3(b)(1), unless the authorization is terminated  or revoked sooner.       Influenza A by PCR NEGATIVE NEGATIVE Final   Influenza B by PCR NEGATIVE NEGATIVE Final    Comment: (NOTE) The Xpert Xpress SARS-CoV-2/FLU/RSV plus assay is intended as an aid in the diagnosis of influenza from Nasopharyngeal swab specimens and should not be used as a sole basis for treatment. Nasal washings and aspirates are unacceptable for Xpert Xpress SARS-CoV-2/FLU/RSV testing.  Fact Sheet for Patients: bloggercourse.com  Fact Sheet for Healthcare  Providers: seriousbroker.it  This test is not yet approved or cleared by the [place]  FDA and has been authorized for detection and/or diagnosis of SARS-CoV-2 by FDA under an Emergency Use Authorization (EUA). This EUA will remain in effect (meaning this test can be used) for the duration of the COVID-19 declaration under Section 564(b)(1) of the Act, 21 U.S.C. section 360bbb-3(b)(1), unless the authorization is terminated or revoked.     Resp Syncytial Virus by PCR NEGATIVE NEGATIVE Final    Comment: (NOTE)  Fact Sheet for Patients: bloggercourse.com  Fact Sheet for Healthcare Providers: seriousbroker.it  This test is not yet approved or cleared by the [place]  FDA and has been authorized for detection and/or diagnosis of SARS-CoV-2 by FDA under an Emergency Use Authorization (EUA). This EUA will remain in effect (meaning this test can be used) for the duration of the COVID-19 declaration under Section 564(b)(1) of the Act, 21 U.S.C. section 360bbb-3(b)(1), unless the authorization is terminated or revoked.  Performed at [facility], [address], [place], [place] [address]     Labs: CBC: Recent Labs  Lab 09/03/24 1145 09/04/24 0654 09/06/24 1412  WBC 8.5 6.6 14.1*  NEUTROABS 4.8 4.0 11.6*  HGB 14.3 13.3 12.9  HCT 42.2 39.4 37.7  MCV 96.3 97.3 95.7  PLT 281 258 208   Basic Metabolic Panel: Recent Labs  Lab 09/03/24 1145 09/04/24 0654 09/06/24 1412  NA 137 137 134*  K 3.9 5.0 3.4*  CL 101 103 99  CO2 21* 25 21*  GLUCOSE 105* 113* 152*  BUN 17 19 14   CREATININE 0.96 1.01* 0.77  CALCIUM  8.3* 8.4* 8.1*   Liver Function Tests: Recent Labs  Lab 09/03/24 1145 09/04/24 0654 09/06/24 1412  AST 17 15 23   ALT 12 11 21   ALKPHOS 111 106 114  BILITOT 0.4 0.5 0.7  PROT 6.6 6.1* 6.3*  ALBUMIN 3.9 3.7 3.6   Discharge time spent:  35 minutes.  Signed: Eric Nunnery,  MD [facility] 09/08/2024

## 2024-09-08 NOTE — TOC Transition Note (Signed)
 Transition of Care [facility]) - Discharge Note   Patient Details  Name: [name] MRN: [id number] Date of Birth: [birth year]  Transition of Care [facility]) CM/SW Contact:  Hoy DELENA Bigness, LCSW Phone Number: 09/08/2024, 11:25 AM   Clinical Narrative:    Pt reports no longer wanting STR and would like to return home with home health. Pt denies having HH in the past and does not have an agency preference. HHPT arranged with [facility].    Final next level of care: Home w Home Health Services Barriers to Discharge: Barriers Resolved   Patient Goals and CMS Choice Patient states their goals for this hospitalization and ongoing recovery are:: To go home CMS Medicare.gov Compare Post Acute Care list provided to:: Patient Choice offered to / list presented to : Patient  ownership interest in [facility].provided to:: Patient    Discharge Placement                       Discharge Plan and Services Additional resources added to the After Visit Summary for   In-house Referral: Clinical Social Work   Post Acute Care Choice: Skilled Nursing Facility          DME Arranged: N/A DME Agency: NA       HH Arranged: PT HH Agency: [facility] Date HH Agency Contacted: 09/08/24 Time HH Agency Contacted: 1125 Representative spoke with at [facility] Agency: [place]  Social Drivers of Health (SDOH) Interventions SDOH Screenings   Food Insecurity: No Food Insecurity (09/06/2024)  Housing: High Risk (09/06/2024)  Transportation Needs: No Transportation Needs (09/06/2024)  Utilities: Not At Risk (09/06/2024)  Depression (PHQ2-9): Medium Risk (06/24/2024)  Financial Resource Strain: Low Risk  (08/11/2020)  Physical Activity: Insufficiently Active (08/11/2020)  Social Connections: Moderately Integrated (09/06/2024)  Stress: Stress Concern Present (08/11/2020)  Tobacco Use: High Risk (09/06/2024)     Readmission Risk Interventions    03/10/2024    7:54 AM   Readmission Risk Prevention Plan  Transportation Screening Complete  Home Care Screening Complete  Medication Review (RN CM) Complete

## 2024-09-09 ENCOUNTER — Emergency Department (HOSPITAL_COMMUNITY)
Admission: EM | Admit: 2024-09-09 | Discharge: 2024-09-10 | Disposition: A | Discharge status: Home / Self Care | Attending: Emergency Medicine | Admitting: Emergency Medicine

## 2024-09-09 ENCOUNTER — Encounter (HOSPITAL_COMMUNITY): Payer: Self-pay

## 2024-09-09 ENCOUNTER — Other Ambulatory Visit: Payer: Self-pay

## 2024-09-09 ENCOUNTER — Emergency Department (HOSPITAL_COMMUNITY)

## 2024-09-09 DIAGNOSIS — W06XXXA Fall from bed, initial encounter: Secondary | ICD-10-CM | POA: Insufficient documentation

## 2024-09-09 DIAGNOSIS — Z8673 Personal history of transient ischemic attack (TIA), and cerebral infarction without residual deficits: Secondary | ICD-10-CM | POA: Diagnosis not present

## 2024-09-09 DIAGNOSIS — M25551 Pain in right hip: Secondary | ICD-10-CM | POA: Insufficient documentation

## 2024-09-09 DIAGNOSIS — M25552 Pain in left hip: Secondary | ICD-10-CM | POA: Diagnosis not present

## 2024-09-09 DIAGNOSIS — J449 Chronic obstructive pulmonary disease, unspecified: Secondary | ICD-10-CM | POA: Insufficient documentation

## 2024-09-09 MED ORDER — PANTOPRAZOLE SODIUM 40 MG PO TBEC
40.0000 mg | DELAYED_RELEASE_TABLET | Freq: Every day | ORAL | Status: DC
  Administered 2024-09-09 – 2024-09-10 (×2): 40 mg via ORAL
  Filled 2024-09-09 (×2): qty 1

## 2024-09-09 MED ORDER — POLYETHYLENE GLYCOL 3350 17 G PO PACK
17.0000 g | PACK | Freq: Two times a day (BID) | ORAL | Status: DC
  Administered 2024-09-09: 17 g via ORAL
  Filled 2024-09-09 (×2): qty 1

## 2024-09-09 MED ORDER — ATORVASTATIN CALCIUM 10 MG PO TABS
20.0000 mg | ORAL_TABLET | Freq: Every evening | ORAL | Status: DC
  Administered 2024-09-09: 20 mg via ORAL
  Filled 2024-09-09: qty 2

## 2024-09-09 MED ORDER — BUDESON-GLYCOPYRROL-FORMOTEROL 160-9-4.8 MCG/ACT IN AERO
2.0000 | INHALATION_SPRAY | Freq: Two times a day (BID) | RESPIRATORY_TRACT | Status: DC
  Administered 2024-09-09 – 2024-09-10 (×3): 2 via RESPIRATORY_TRACT
  Filled 2024-09-09 (×2): qty 5.9

## 2024-09-09 MED ORDER — METHOCARBAMOL 500 MG PO TABS
500.0000 mg | ORAL_TABLET | Freq: Three times a day (TID) | ORAL | Status: DC | PRN
Start: 2024-09-09 — End: 2024-09-10

## 2024-09-09 MED ORDER — OXYCODONE HCL 5 MG PO TABS
10.0000 mg | ORAL_TABLET | Freq: Four times a day (QID) | ORAL | Status: DC
Start: 2024-09-09 — End: 2024-09-10
  Administered 2024-09-09 – 2024-09-10 (×5): 10 mg via ORAL
  Filled 2024-09-09 (×5): qty 2

## 2024-09-09 MED ORDER — OXYCODONE-ACETAMINOPHEN 5-325 MG PO TABS
2.0000 | ORAL_TABLET | Freq: Once | ORAL | Status: AC
  Administered 2024-09-09: 2 via ORAL
  Filled 2024-09-09: qty 2

## 2024-09-09 MED ORDER — ALBUTEROL SULFATE (2.5 MG/3ML) 0.083% IN NEBU: 2.5000 mg | INHALATION_SOLUTION | RESPIRATORY_TRACT | Status: DC | PRN

## 2024-09-09 MED ORDER — CLONAZEPAM 0.5 MG PO TABS
0.5000 mg | ORAL_TABLET | Freq: Two times a day (BID) | ORAL | Status: DC | PRN
  Administered 2024-09-09 – 2024-09-10 (×2): 0.5 mg via ORAL
  Filled 2024-09-09 (×2): qty 1

## 2024-09-09 MED ORDER — PHENYTOIN SODIUM EXTENDED 100 MG PO CAPS
400.0000 mg | ORAL_CAPSULE | Freq: Every day | ORAL | Status: DC
  Administered 2024-09-09: 400 mg via ORAL
  Filled 2024-09-09: qty 4

## 2024-09-09 MED ORDER — ACETAMINOPHEN 500 MG PO TABS: 1000.0000 mg | ORAL_TABLET | Freq: Three times a day (TID) | ORAL | Status: DC

## 2024-09-09 MED ORDER — POLYETHYLENE GLYCOL 3350 17 G PO PACK
17.0000 g | PACK | Freq: Every day | ORAL | Status: DC | PRN
  Administered 2024-09-10: 17 g via ORAL

## 2024-09-09 MED ORDER — VALACYCLOVIR HCL 500 MG PO TABS
1000.0000 mg | ORAL_TABLET | Freq: Three times a day (TID) | ORAL | Status: DC
  Administered 2024-09-09 – 2024-09-10 (×4): 1000 mg via ORAL
  Filled 2024-09-09 (×4): qty 2

## 2024-09-09 MED ORDER — KETOROLAC TROMETHAMINE 60 MG/2ML IM SOLN
30.0000 mg | Freq: Once | INTRAMUSCULAR | Status: AC
  Administered 2024-09-09: 30 mg via INTRAMUSCULAR
  Filled 2024-09-09: qty 2

## 2024-09-09 MED ORDER — METHOCARBAMOL 500 MG PO TABS
500.0000 mg | ORAL_TABLET | Freq: Once | ORAL | Status: AC
  Administered 2024-09-09: 500 mg via ORAL
  Filled 2024-09-09: qty 1

## 2024-09-09 MED ORDER — ACETAMINOPHEN 500 MG PO TABS
1000.0000 mg | ORAL_TABLET | Freq: Three times a day (TID) | ORAL | Status: DC
  Administered 2024-09-09 – 2024-09-10 (×3): 1000 mg via ORAL
  Filled 2024-09-09 (×3): qty 2

## 2024-09-09 MED ORDER — TAMOXIFEN CITRATE 10 MG PO TABS
20.0000 mg | ORAL_TABLET | Freq: Every day | ORAL | Status: DC
  Administered 2024-09-09 – 2024-09-10 (×2): 20 mg via ORAL
  Filled 2024-09-09 (×3): qty 2

## 2024-09-09 MED ORDER — DULOXETINE HCL 20 MG PO CPEP
20.0000 mg | ORAL_CAPSULE | Freq: Every day | ORAL | Status: DC
  Administered 2024-09-09 – 2024-09-10 (×2): 20 mg via ORAL
  Filled 2024-09-09 (×2): qty 1

## 2024-09-09 NOTE — ED Notes (Signed)
 [facility] declined offering patient a bed. [facility] is considering, unsure if they have bed availability. CSW will continue to follow.

## 2024-09-09 NOTE — ED Provider Notes (Signed)
 [place] EMERGENCY DEPARTMENT AT [facility] Provider Note   CSN: [id number] Arrival date & time: 09/09/24  9462     Patient presents with: Hip Pain   [name] is a 73 y.o. female.    Hip Pain  Patient presents for bilateral hip pain.  Medical history includes COPD, CVA, seizures, PUD, arthritis, anxiety, bipolar disorder, chronic pain.  She was seen in the ED 3 days ago after a fall from bed.  CT of pelvis showed minimally displaced fracture of right superior pubic rami extending to pubic symphysis, acute nondisplaced fracture of base of left superior pubic rami and anterior acetabulum, and acute minimally displaced fracture of left inferior pubic rami.  This was discussed with orthopedic surgery who recommends nonoperative management.  She was admitted to the hospital for pain control.  PT evaluation while in hospital recommended skilled nursing facility placement.  Patient declined this and preferred to go home with home health PT. at time of discharge, prescribed pain regimen included Tylenol , lidocaine  patches, Robaxin , oxycodone .  Last dose of pain medicine was last night.  This morning, she got up to use the bathroom.  While in the toilet, she was unable to get up due to the hip pain.  Patient's husband called EMS.  Patient denies any falls since her hospital discharge yesterday but states that she might have because her memory is not good.     Prior to Admission medications   Medication Sig Start Date End Date Taking? Authorizing Provider  acetaminophen  (TYLENOL ) 500 MG tablet Take 2 tablets (1,000 mg total) by mouth every 8 (eight) hours. 09/08/24   Ricky Fines, MD  albuterol  (PROVENTIL ) (2.5 MG/3ML) 0.083% nebulizer solution Take 3 mLs (2.5 mg total) by nebulization every 4 (four) hours as needed for wheezing or shortness of breath. 06/13/24   Johnson, Clanford L, MD  albuterol  (VENTOLIN  HFA) 108 (90 Base) MCG/ACT inhaler Inhale 2 puffs into the lungs every 6  (six) hours as needed for wheezing or shortness of breath. 06/13/24   Johnson, Clanford L, MD  Aromatic Inhalants (VICKS VAPOINHALER IN) Inhale 1 puff into the lungs daily as needed (congestion).    [provider]  atorvastatin  (LIPITOR) 20 MG tablet Take 1 tablet (20 mg total) by mouth every evening. 06/24/24   Bevely Doffing, FNP  budeson-glycopyrrolate -formoterol  (BREZTRI  AEROSPHERE) 160-9-4.8 MCG/ACT AERO inhaler Inhale 2 puffs into the lungs 2 (two) times daily.    [provider]  carboxymethylcellulose (REFRESH PLUS) 0.5 % SOLN Place 1 drop into both eyes 3 (three) times daily as needed (dry/irritated eyes.).    [provider]  clonazePAM  (KLONOPIN ) 0.5 MG tablet Take 1 tablet (0.5 mg total) by mouth 2 (two) times daily as needed for anxiety. 09/08/24   Ricky Fines, MD  dicyclomine  (BENTYL ) 10 MG capsule Take 10 mg by mouth 2 (two) times daily.    [provider]  DULoxetine  (CYMBALTA ) 20 MG capsule Take 20 mg by mouth daily. 02/04/24   [provider]  hydrOXYzine (ATARAX) 25 MG tablet Take 25 mg by mouth 2 (two) times daily. 09/10/22   [provider]  lidocaine  (LIDODERM ) 5 % Place 1 patch onto the skin daily as needed. Remove & Discard patch within 12 hours or as directed by MD 09/10/23   Elnor Jayson LABOR, DO  methocarbamol  (ROBAXIN ) 500 MG tablet Take 1 tablet (500 mg total) by mouth every 8 (eight) hours as needed for muscle spasms. 09/08/24   Ricky Fines, MD  Multiple Vitamin (MULTIVITAMIN WITH MINERALS) TABS tablet Take 1 tablet by mouth 3 (three) times a week.    [provider]  omeprazole  (PRILOSEC) 40 MG capsule Take 1 capsule (40 mg total) by mouth daily. 08/12/24   Bevely Doffing, FNP  Oxycodone  HCl 10 MG TABS Take 10 mg by mouth 4 (four) times daily. 05/19/24   [provider]  phenytoin  (DILANTIN ) 100 MG ER capsule Take 400 mg by mouth at bedtime.    [provider]  polyethylene glycol (MIRALAX  /  GLYCOLAX ) 17 g packet Take 17 g by mouth daily as needed for mild constipation. 09/08/24   Ricky Fines, MD  tamoxifen  (NOLVADEX ) 20 MG tablet TAKE ONE TABLET BY MOUTH ONCE DAILY. 11/15/22   Rogers Hai, MD  traZODone  (DESYREL ) 150 MG tablet Take 300 mg by mouth daily. 06/20/24   [provider]  valACYclovir (VALTREX) 1000 MG tablet Take 1 tablet (1,000 mg total) by mouth 3 (three) times daily for 7 days. 09/04/24 09/11/24  Kammerer, Megan L, DO    Allergies: Varenicline, Codeine, Haemophilus b polysaccharide vaccine, Influenza virus vaccine, and Lithium     Review of Systems  Musculoskeletal:  Positive for arthralgias.  All other systems reviewed and are negative.   Updated Vital Signs BP (!) 150/58 (BP Location: Left Arm)   Pulse 84   Temp 99 F (37.2 C) (Oral)   Resp 20   Ht 5' 3.5 (1.613 m)   Wt 50.3 kg   SpO2 94%   BMI 19.34 kg/m   Physical Exam Vitals and nursing note reviewed.  Constitutional:      General: She is not in acute distress.    Appearance: Normal appearance. She is well-developed. She is not ill-appearing, toxic-appearing or diaphoretic.  HENT:     Head: Normocephalic and atraumatic.     Right Ear: External ear normal.     Left Ear: External ear normal.     Nose: Nose normal.     Mouth/Throat:     Mouth: Mucous membranes are moist.  Eyes:     Extraocular Movements: Extraocular movements intact.     Conjunctiva/sclera: Conjunctivae normal.  Cardiovascular:     Rate and Rhythm: Normal rate and regular rhythm.     Heart sounds: No murmur heard. Pulmonary:     Effort: Pulmonary effort is normal. Tachypnea present. No respiratory distress.     Breath sounds: Normal breath sounds. No decreased breath sounds, wheezing, rhonchi or rales.  Abdominal:     General: There is no distension.     Palpations: Abdomen is soft.     Tenderness: There is no abdominal tenderness.  Musculoskeletal:        General: No swelling or deformity.      Cervical back: Normal range of motion and neck supple.  Skin:    General: Skin is warm and dry.     Coloration: Skin is not jaundiced or pale.  Neurological:     General: No focal deficit present.     Mental Status: She is alert and oriented to person, place, and time.  Psychiatric:        Mood and Affect: Mood normal.        Behavior: Behavior normal.     (all labs ordered are listed, but only abnormal results are displayed) Labs Reviewed - No data to display  EKG: None  Radiology: DG Pelvis Portable Result Date: 09/09/2024 CLINICAL DATA:  Bilateral hip pain. EXAM: PORTABLE PELVIS 1-2 VIEWS COMPARISON:  CT 09/06/2024.  FINDINGS: Bones are diffusely demineralized. Acute fractures of the right superior and left inferior pubic rami again noted, better characterized on the recent CT scan. The known left superior pubic ramus fracture not well demonstrated. No evidence for hip dislocation. Fixation hardware noted right proximal femur, incompletely visualized. IMPRESSION: Acute fractures of the right superior and left inferior pubic rami, better characterized on the recent CT scan. The left superior pubic ramus fracture seen on that study is not well demonstrated on x-ray. Electronically Signed   By: Camellia Candle M.D.   On: 09/09/2024 06:20     Procedures   Medications Ordered in the ED  albuterol  (PROVENTIL ) (2.5 MG/3ML) 0.083% nebulizer solution 2.5 mg (has no administration in time range)  atorvastatin  (LIPITOR) tablet 20 mg (has no administration in time range)  budesonide -glycopyrrolate -formoterol  (BREZTRI ) 160-9-4.8 MCG/ACT inhaler 2 puff (has no administration in time range)  clonazePAM  (KLONOPIN ) tablet 0.5 mg (has no administration in time range)  DULoxetine  (CYMBALTA ) DR capsule 20 mg (has no administration in time range)  methocarbamol  (ROBAXIN ) tablet 500 mg (has no administration in time range)  pantoprazole  (PROTONIX ) EC tablet 40 mg (has no administration in time range)   oxyCODONE  (Oxy IR/ROXICODONE ) immediate release tablet 10 mg (has no administration in time range)  phenytoin  (DILANTIN ) ER capsule 400 mg (has no administration in time range)  polyethylene glycol (MIRALAX  / GLYCOLAX ) packet 17 g (has no administration in time range)  tamoxifen  (NOLVADEX ) tablet 20 mg (has no administration in time range)  valACYclovir (VALTREX) tablet 1,000 mg (has no administration in time range)  acetaminophen  (TYLENOL ) tablet 1,000 mg (has no administration in time range)  ketorolac  (TORADOL ) injection 30 mg (30 mg Intramuscular Given 09/09/24 0618)  oxyCODONE -acetaminophen  (PERCOCET/ROXICET) 5-325 MG per tablet 2 tablet (2 tablets Oral Given 09/09/24 0617)  methocarbamol  (ROBAXIN ) tablet 500 mg (500 mg Oral Given 09/09/24 0617)                                    Medical Decision Making Amount and/or Complexity of Data Reviewed Radiology: ordered.  Risk OTC drugs. Prescription drug management.   Patient presenting for bilateral hip pain.  This is in the setting of a recent fall which caused multiple pelvic fractures.  She was hospitalized and evaluated by orthopedic surgery who recommended nonoperative management.  She was discharged from hospital yesterday with multimodal pain regimen.  She has not taken pain medication today.  She presents for bilateral hip pain consistent with her recent injuries.  She denies any other areas of discomfort.  Patient's home pain medications were ordered.  Pelvic x-ray shows no new injuries.  At time of her discharge yesterday, it was recommended that she went to a skilled nursing facility.  Patient declined this in favor of going home.  Today, she is considering facility placement.  On reassessment, patient's pain has improved.  I had further discussion about nursing facility placement.  Patient is in favor of this.  Patient to remain in the ED pending TOC consult.  Diet and home medications ordered.     Final diagnoses:  Acute  pain of both hips    ED Discharge Orders     None          Melvenia Motto, MD 09/09/24 [phone]

## 2024-09-09 NOTE — Evaluation (Signed)
 Physical Therapy Evaluation Patient Details Name: [name] MRN: [id number] DOB: 12-Jul-1951 Today's Date: 09/09/2024  History of Present Illness  [name] is a 73 y.o. female.           Hip Pain    Patient presents for bilateral hip pain.  Medical history includes COPD, CVA, seizures, PUD, arthritis, anxiety, bipolar disorder, chronic pain.  She was seen in the ED 3 days ago after a fall from bed.  CT of pelvis showed minimally displaced fracture of right superior pubic rami extending to pubic symphysis, acute nondisplaced fracture of base of left superior pubic rami and anterior acetabulum, and acute minimally displaced fracture of left inferior pubic rami.  This was discussed with orthopedic surgery who recommends nonoperative management.  She was admitted to the hospital for pain control.  PT evaluation while in hospital recommended skilled nursing facility placement.  Patient declined this and preferred to go home with home health PT. at time of discharge, prescribed pain regimen included Tylenol , lidocaine  patches, Robaxin , oxycodone .  Last dose of pain medicine was last night.  This morning, she got up to use the bathroom.  While in the toilet, she was unable to get up due to the hip pain.  Patient's husband called EMS.  Patient denies any falls since her hospital discharge yesterday but states that she might have because her memory is not good.   Clinical Impression  Patient demonstrates slow labored movement for sitting up at bedside with increased right hip/groin pain, poor carryover for maintaining NWB on RW when standing and limited to a few shuffling side steps to transfer to chair and BSC. Patient tolerated sitting up [facility] after therapy - RN notified. Patient will benefit from continued skilled physical therapy in hospital and recommended venue below to increase strength, balance, endurance for safe ADLs and gait.           If plan is discharge home, recommend the following: A  lot of help with walking and/or transfers;Assistance with cooking/housework;Assist for transportation;Help with stairs or ramp for entrance   Can travel by private vehicle   No    Equipment Recommendations None recommended by PT  Recommendations for Other Services       Functional Status Assessment Patient has had a recent decline in their functional status and demonstrates the ability to make significant improvements in function in a reasonable and predictable amount of time.     Precautions / Restrictions Precautions Precautions: Fall Recall of Precautions/Restrictions: Impaired Precaution/Restrictions Comments: NWB RLE Restrictions Weight Bearing Restrictions Per Provider Order: Yes RLE Weight Bearing Per Provider Order: Non weight bearing      Mobility  Bed Mobility Overal bed mobility: Needs Assistance Bed Mobility: Supine to Sit, Sit to Supine     Supine to sit: Contact guard, Min assist     General bed mobility comments: slow labored movment with c/o increased right hip pain    Transfers Overall transfer level: Needs assistance Equipment used: Rolling walker (2 wheels) Transfers: Sit to/from Stand, Bed to chair/wheelchair/BSC Sit to Stand: Mod assist Stand pivot transfers: Mod assist Step pivot transfers: Mod assist       General transfer comment: poor carryover for keeping RLE off floor, unsteady labored movement    Ambulation/Gait Ambulation/Gait assistance: Mod assist, Max assist Gait Distance (Feet): 3 Feet Assistive device: Rolling walker (2 wheels) Gait Pattern/deviations: Decreased step length - right, Decreased step length - left, Decreased stance time - right, Decreased stride length Gait velocity: sow  General Gait Details: limited to a few side steps at bedside with poor carryover for keeping RLE off floor to maintain NWB  Stairs            Wheelchair Mobility     Tilt Bed    Modified Rankin (Stroke Patients Only)        Balance Overall balance assessment: Needs assistance Sitting-balance support: Feet unsupported, No upper extremity supported Sitting balance-Leahy Scale: Fair Sitting balance - Comments: fair/good seated at EOB   Standing balance support: Reliant on assistive device for balance, During functional activity, Bilateral upper extremity supported Standing balance-Leahy Scale: Poor Standing balance comment: using RW                             Pertinent Vitals/Pain Pain Assessment Pain Assessment: Faces Faces Pain Scale: Hurts even more Pain Location: right groin area Pain Descriptors / Indicators: Discomfort, Grimacing, Moaning, Sharp, Sore Pain Intervention(s): Limited activity within patient's tolerance, Monitored during session, Repositioned    Home Living Family/patient expects to be discharged to:: Private residence Living Arrangements: Spouse/significant other;Children Available Help at Discharge: Family Type of Home: Mobile home Home Access: Stairs to enter Entrance Stairs-Rails: Doctor, General Practice of Steps: 3   Home Layout: One level Home Equipment: Cane - quad;Cane - single point;Hospital bed;BSC/3in1;Rolling Walker (2 wheels) Additional Comments: Pt. uses BSC for bathing the most, and uses RW for community ambulation.    Prior Function Prior Level of Function : Needs assist       Physical Assist : Mobility (physical) Mobility (physical): Transfers;Gait;Stairs   Mobility Comments: Son was able helping with ambulation, and transfer from bed to chair at home ADLs Comments: Pt. is able to go shopping, and bath, groom themselves     Extremity/Trunk Assessment   Upper Extremity Assessment Upper Extremity Assessment: Generalized weakness    Lower Extremity Assessment Lower Extremity Assessment: Generalized weakness;RLE deficits/detail RLE Deficits / Details: grossly 3/5 RLE: Unable to fully assess due to pain RLE Sensation: WNL RLE  Coordination: decreased gross motor    Cervical / Trunk Assessment Cervical / Trunk Assessment: Kyphotic  Communication   Communication Communication: No apparent difficulties    Cognition Arousal: Alert Behavior During Therapy: WFL for tasks assessed/performed, Anxious                             Following commands: Intact       Cueing Cueing Techniques: Verbal cues, Tactile cues     General Comments      Exercises     Assessment/Plan    PT Assessment Patient needs continued PT services  PT Problem List Decreased strength;Decreased activity tolerance;Decreased balance;Decreased mobility;Pain       PT Treatment Interventions DME instruction;Gait training;Stair training;Functional mobility training;Therapeutic activities;Therapeutic exercise;Balance training;Patient/family education    PT Goals (Current goals can be found in the Care Plan section)  Acute Rehab PT Goals Patient Stated Goal: return home PT Goal Formulation: With patient Time For Goal Achievement: 09/23/24 Potential to Achieve Goals: Good    Frequency Min 2X/week     Co-evaluation               AM-PAC PT 6 Clicks Mobility  Outcome Measure Help needed turning from your back to your side while in a flat bed without using bedrails?: A Little Help needed moving from lying on your back to sitting on the side of a flat  bed without using bedrails?: A Little Help needed moving to and from a bed to a chair (including a wheelchair)?: A Lot Help needed standing up from a chair using your arms (e.g., wheelchair or bedside chair)?: A Lot Help needed to walk in hospital room?: A Lot Help needed climbing 3-5 steps with a railing? : Total 6 Click Score: 13    End of Session   Activity Tolerance: Patient tolerated treatment well;Patient limited by fatigue;Patient limited by pain Patient left: Other (comment) (Patient left seated on [facility]) Nurse Communication: Mobility status PT Visit  Diagnosis: Unsteadiness on feet (R26.81);Other abnormalities of gait and mobility (R26.89);Repeated falls (R29.6);Muscle weakness (generalized) (M62.81)    Time: 9140-9076 PT Time Calculation (min) (ACUTE ONLY): 24 min   Charges:   PT Evaluation $PT Eval Moderate Complexity: 1 Mod PT Treatments $Therapeutic Activity: 23-37 mins PT General Charges $$ ACUTE PT VISIT: 1 Visit         2:25 PM, 09/09/24 Lynwood Music, MPT Physical Therapist with [facility] [phone] office [phone] mobile phone

## 2024-09-09 NOTE — Progress Notes (Signed)
   09/09/24 1309  TOC ED Mini Assessment  TOC Time spent with patient (minutes): 35  PING Used in TOC Assessment No  Admission or Readmission Diverted Yes  Interventions which prevented an admission or readmission SNF Placement  What brought you to the Emergency Department?  Hip pain  Barriers to Discharge ED Bed availability  Barrier interventions placement for short-term rehab  Means of departure Not know  Key Contact 1 Maci Eickholt with patient at bedside  Contact Date 09/09/24  Patient states their goals for this hospitalization and ongoing recovery are: Get rehab  CMS Medicare.gov Compare Post Acute Care list provided to: Patient  Choice offered to / list presented to  Patient     CSW spoke with patient at bedside and assessed her. Patient expressed that it is her, her husband, and son in the home. She reports that  they assist with her needs and that she has a walker, 2 canes, and BCS. She reports that she does not drive. CSW asked patient about facility choice and she asked for her referral to be sent to [facility]. Patient asked about process which was explained and asked if she could go home to wait, and then go to SNF. CSW shared with her that placement will need to take place in hospital. Patient agreeable to wait. CSW sent referral out via [facility].

## 2024-09-09 NOTE — Plan of Care (Signed)
  Problem: Acute Rehab PT Goals(only PT should resolve) Goal: Pt Will Go Supine/Side To Sit Outcome: Progressing Flowsheets (Taken 09/09/2024 1428) Pt will go Supine/Side to Sit: with supervision Goal: Patient Will Transfer Sit To/From Stand Outcome: Progressing Flowsheets (Taken 09/09/2024 1428) Patient will transfer sit to/from stand:  with minimal assist  with contact guard assist Goal: Pt Will Transfer Bed To Chair/Chair To Bed Outcome: Progressing Flowsheets (Taken 09/09/2024 1428) Pt will Transfer Bed to Chair/Chair to Bed:  with min assist  with contact guard assist Goal: Pt Will Ambulate Outcome: Progressing Flowsheets (Taken 09/09/2024 1428) Pt will Ambulate:  15 feet  with rolling walker  with minimal assist Note: NWB RLE   2:28 PM, 09/09/24 Lynwood Music, MPT Physical Therapist with [facility] [phone] office [phone] mobile phone

## 2024-09-09 NOTE — ED Triage Notes (Signed)
 Pt BIB [facility] from home for bilateral hip pain that she was seen & discharged with pain medication. Pt pain is not controlled this morning but hasn't taken her morning dose. States it worked last night because she got sleep. Denies any other s/s at time of triage.

## 2024-09-10 NOTE — ED Provider Notes (Signed)
 Pt does not want rehab and was seen by social worker today.  She is discharged home   Suzette Pac, MD 09/10/24 1032

## 2024-09-10 NOTE — ED Notes (Signed)
 Patient only bed offer is [facility], will discuss with patient about bed offers since [facility] does not have any open female beds at this time and [facility] declined. CSW will continue to follow.    Addendum 10:00 AM  CSW spoke with patient at bedside about her only bed offer from [facility]. Patient declined stated that she did not want to go to [place]. CSW shared an alternative option of HHPT and HHOT at home and patient agreed to go home  with [facility]. CSW reached out to last [facility] agency that was arranged , [facility]. Jennifer with [facility] accepted referral. Husband is at bedside and it was explained to him as well HH vs. [facility]. CSW signing off.

## 2024-09-10 NOTE — Discharge Instructions (Signed)
Follow up with your family md in 1-2 weeks °

## 2024-09-11 ENCOUNTER — Other Ambulatory Visit: Payer: Self-pay

## 2024-09-11 ENCOUNTER — Emergency Department (HOSPITAL_COMMUNITY)

## 2024-09-11 ENCOUNTER — Emergency Department (HOSPITAL_COMMUNITY)
Admission: EM | Admit: 2024-09-11 | Discharge: 2024-09-11 | Disposition: A | Discharge status: Home / Self Care | Attending: Emergency Medicine | Admitting: Emergency Medicine

## 2024-09-11 DIAGNOSIS — Z7951 Long term (current) use of inhaled steroids: Secondary | ICD-10-CM | POA: Diagnosis not present

## 2024-09-11 DIAGNOSIS — R410 Disorientation, unspecified: Secondary | ICD-10-CM | POA: Diagnosis not present

## 2024-09-11 DIAGNOSIS — W19XXXD Unspecified fall, subsequent encounter: Secondary | ICD-10-CM | POA: Diagnosis not present

## 2024-09-11 DIAGNOSIS — J449 Chronic obstructive pulmonary disease, unspecified: Secondary | ICD-10-CM | POA: Insufficient documentation

## 2024-09-11 DIAGNOSIS — S32519D Fracture of superior rim of unspecified pubis, subsequent encounter for fracture with routine healing: Secondary | ICD-10-CM | POA: Diagnosis not present

## 2024-09-11 DIAGNOSIS — S3992XD Unspecified injury of lower back, subsequent encounter: Secondary | ICD-10-CM | POA: Diagnosis present

## 2024-09-11 NOTE — ED Notes (Signed)
 Pt refusing blood, IV and CT scans, reports that she is ready to go home. MD notified.

## 2024-09-11 NOTE — ED Triage Notes (Signed)
 Pt BIB EMS from w/ reports of back pain that radiates to her feet. Pt seen 4-5 times this week w/ back pain.

## 2024-09-11 NOTE — ED Provider Notes (Signed)
 [place] EMERGENCY DEPARTMENT AT [facility] Provider Note   CSN: [id number] Arrival date & time: 09/11/24  0210     Patient presents with: Back Pain   [name] is a 73 y.o. female.   The history is provided by the patient.  Back Pain  She has history of COPD, stroke, bipolar disorder, seizures, recent fall with pelvic fracture and comes in stating that she is just not doing well at home.  She states she has been confused.  When she tries to stand up she states she cannot feel her feet.  She is continuing to have pain in her pelvis.  She denies fever or chills and denies cough.  She does not think she is doing well at home.  She had been offered placement in a skilled nursing facility several times and has declined, states that she is now willing to consider this.    Prior to Admission medications   Medication Sig Start Date End Date Taking? Authorizing Provider  acetaminophen  (TYLENOL ) 500 MG tablet Take 2 tablets (1,000 mg total) by mouth every 8 (eight) hours. 09/08/24   Ricky Fines, MD  albuterol  (PROVENTIL ) (2.5 MG/3ML) 0.083% nebulizer solution Take 3 mLs (2.5 mg total) by nebulization every 4 (four) hours as needed for wheezing or shortness of breath. Patient not taking: Reported on 09/09/2024 06/13/24   Vicci Afton CROME, MD  albuterol  (VENTOLIN  HFA) 108 (90 Base) MCG/ACT inhaler Inhale 2 puffs into the lungs every 6 (six) hours as needed for wheezing or shortness of breath. Patient not taking: Reported on 09/09/2024 06/13/24   Vicci Afton CROME, MD  Aromatic Inhalants (VICKS VAPOINHALER IN) Inhale 1 puff into the lungs daily as needed (congestion).    [provider]  atorvastatin  (LIPITOR) 20 MG tablet Take 1 tablet (20 mg total) by mouth every evening. 06/24/24   Bevely Doffing, FNP  budeson-glycopyrrolate -formoterol  (BREZTRI  AEROSPHERE) 160-9-4.8 MCG/ACT AERO inhaler Inhale 2 puffs into the lungs 2 (two) times daily.    [provider]  carboxymethylcellulose (REFRESH PLUS) 0.5 % SOLN Place 1 drop into both eyes 3 (three) times daily as needed (dry/irritated eyes.).    [provider]  clonazePAM  (KLONOPIN ) 0.5 MG tablet Take 1 tablet (0.5 mg total) by mouth 2 (two) times daily as needed for anxiety. 09/08/24   Ricky Fines, MD  dicyclomine  (BENTYL ) 10 MG capsule Take 10 mg by mouth 2 (two) times daily.    [provider]  DULoxetine  (CYMBALTA ) 20 MG capsule Take 20 mg by mouth daily. 02/04/24   [provider]  HYDROcodone -acetaminophen  (NORCO) 10-325 MG tablet Take 1 tablet by mouth every 6 (six) hours as needed.    [provider]  hydrOXYzine (ATARAX) 25 MG tablet Take 25 mg by mouth 2 (two) times daily. 09/10/22   [provider]  methocarbamol  (ROBAXIN ) 500 MG tablet Take 1 tablet (500 mg total) by mouth every 8 (eight) hours as needed for muscle spasms. 09/08/24   Ricky Fines, MD  Multiple Vitamin (MULTIVITAMIN WITH MINERALS) TABS tablet Take 1 tablet by mouth 3 (three) times a week.    [provider]  omeprazole  (PRILOSEC) 40 MG capsule Take 1 capsule (40 mg total) by mouth daily. 08/12/24   Bevely Doffing, FNP  phenytoin  (DILANTIN ) 100 MG ER capsule Take 400 mg by mouth at bedtime.    [provider]  polyethylene glycol (MIRALAX  / GLYCOLAX ) 17 g packet Take 17 g by mouth daily as needed for mild constipation.  09/08/24   Ricky Fines, MD  tamoxifen  (NOLVADEX ) 20 MG tablet TAKE ONE TABLET BY MOUTH ONCE DAILY. 11/15/22   Rogers Hai, MD  traZODone  (DESYREL ) 150 MG tablet Take 300 mg by mouth daily. 06/20/24   [provider]  valACYclovir (VALTREX) 1000 MG tablet Take 1 tablet (1,000 mg total) by mouth 3 (three) times daily for 7 days. 09/04/24 09/11/24  Kammerer, Megan L, DO    Allergies: Varenicline, Codeine, Haemophilus b polysaccharide vaccine, Influenza virus vaccine, and Lithium     Review of Systems  Musculoskeletal:  Positive  for back pain.  All other systems reviewed and are negative.   Updated Vital Signs BP (!) 155/72   Pulse 90   Temp 98.3 F (36.8 C) (Oral)   Resp 18   Ht 5' 3 (1.6 m)   Wt 51.3 kg   SpO2 95%   BMI 20.02 kg/m   Physical Exam Vitals and nursing note reviewed.   73 year old female, resting comfortably and in no acute distress. Vital signs are significant for elevated blood pressure. Oxygen  saturation is 95%, which is normal. Head is normocephalic and atraumatic. PERRLA, EOMI. Oropharynx is clear. Neck is nontender and supple. Back is nontender. Lungs are clear without rales, wheezes, or rhonchi. Chest is nontender. Heart has regular rate and rhythm without murmur. Abdomen is soft, flat, nontenderr. Pelvis has marked tenderness along the left superior pubic ramus. Extremities have no cyanosis or edema, full range of motion is present, but there is pain with range of motion of both hips. Skin is warm and dry without rash. Neurologic: Awake and alert, cranial nerves are intact, has 5/5 strength in both arms.  There is normal strength of plantarflexion and dorsiflexion.  There is weakness of hip flexion bilaterally but this seems to be from pain rather than actual strength issue.  (all labs ordered are listed, but only abnormal results are displayed) Labs Reviewed - No data to display  EKG: None  Radiology: DG Pelvis Portable Result Date: 09/09/2024 CLINICAL DATA:  Bilateral hip pain. EXAM: PORTABLE PELVIS 1-2 VIEWS COMPARISON:  CT 09/06/2024. FINDINGS: Bones are diffusely demineralized. Acute fractures of the right superior and left inferior pubic rami again noted, better characterized on the recent CT scan. The known left superior pubic ramus fracture not well demonstrated. No evidence for hip dislocation. Fixation hardware noted right proximal femur, incompletely visualized. IMPRESSION: Acute fractures of the right superior and left inferior pubic rami, better characterized on  the recent CT scan. The left superior pubic ramus fracture seen on that study is not well demonstrated on x-ray. Electronically Signed   By: Camellia Candle M.D.   On: 09/09/2024 06:20     Procedures   Medications Ordered in the ED - No data to display                                  Medical Decision Making Amount and/or Complexity of Data Reviewed Labs: ordered. Radiology: ordered.   Possible confusion.  However on talking with her, my sense is that she is just frustrated that she is not improving more quickly.  I have reviewed her past records and note hospitalization on 09/06/2024 for a fall with pelvis fracture.  Patient declined transfer to skilled nursing facility at that time.  ED visit 11/12 with ongoing pain.  She initially stated that she would agree to go to skilled nursing facility, then changed her mind  and insisted on going home.  I have ordered chest x-ray, head CT, and screening labs to look for causes for confusion.  If workup is negative, plan to request social service consult for placement.  However, I suspect that she would once again refused placement.  Patient is now stating that nobody had told her that she had a pelvis fracture, does not want any workup and wishes to go home.  I will discharge her with instructions to follow-up with orthopedics as previously arranged.     Final diagnoses:  Closed fracture of superior ramus of pubis with routine healing, subsequent encounter    ED Discharge Orders     None          Raford Lenis, MD 09/11/24 [phone]

## 2024-09-11 NOTE — Discharge Instructions (Addendum)
 You have a broken bone in your pelvis.  This will take several months to heal.  You need to continue working with physical therapy, weightbearing as tolerated.  Continue taking your pain medication as needed.  You can also get additional pain relief by applying ice to the area where you are having pain.

## 2024-09-14 ENCOUNTER — Other Ambulatory Visit: Payer: Self-pay

## 2024-09-14 ENCOUNTER — Telehealth: Payer: Self-pay

## 2024-09-14 DIAGNOSIS — G894 Chronic pain syndrome: Secondary | ICD-10-CM

## 2024-09-14 MED ORDER — HYDROCODONE-ACETAMINOPHEN 10-325 MG PO TABS: 1.0000 | ORAL_TABLET | Freq: Four times a day (QID) | ORAL | 0 refills | Status: DC | PRN

## 2024-09-14 NOTE — Telephone Encounter (Signed)
 Prescription Request  09/14/2024  LOV: 06/24/2024  What is the name of the medication or equipment? HYDROcodone -acetaminophen  (NORCO) 10-325 MG tablet [[id number]   clonazePAM  (KLONOPIN ) 0.5 MG tablet [[id number]]   Have you contacted your pharmacy to request a refill? Yes   Which pharmacy would you like this sent to?  [facility] [place] [place] - [address] [place] [address] Phone: [phone] Fax: [phone]    Patient notified that their request is being sent to the clinical staff for review and that they should receive a response within 2 business days.   Please advise at patient spouse came into office

## 2024-09-14 NOTE — Telephone Encounter (Signed)
 [name] with [facility] states they had to note they told someone and also stated that she will put in her notes that you didn't prescribe thoses medications

## 2024-09-14 NOTE — Telephone Encounter (Signed)
 I do not prescribe either of these medications

## 2024-09-14 NOTE — Telephone Encounter (Signed)
 Copied from CRM #[id number]. Topic: Clinical - Home Health Verbal Orders >> Sep 14, 2024 12:20 PM Mercedes MATSU wrote: Caller/Agency: Harlene ([facility]) Callback Number: [phone] (secure line can leave vm) Service Requested: Physical Therapy Frequency: twice a week 4 weeks, 1 a week for 4 weeks. Any new concerns about the patient? Yes, drug interaction that the computer flagged them with between phenytoin  sodium and tamxosen.

## 2024-09-14 NOTE — Telephone Encounter (Signed)
 Refill sent to pharmacy.

## 2024-09-20 ENCOUNTER — Emergency Department (HOSPITAL_COMMUNITY)
Admission: EM | Admit: 2024-09-20 | Discharge: 2024-09-20 | Disposition: A | Discharge status: Home / Self Care | Attending: Emergency Medicine | Admitting: Emergency Medicine

## 2024-09-20 ENCOUNTER — Other Ambulatory Visit: Payer: Self-pay

## 2024-09-20 DIAGNOSIS — M7989 Other specified soft tissue disorders: Secondary | ICD-10-CM | POA: Diagnosis present

## 2024-09-20 DIAGNOSIS — Z79899 Other long term (current) drug therapy: Secondary | ICD-10-CM | POA: Diagnosis not present

## 2024-09-20 DIAGNOSIS — R600 Localized edema: Secondary | ICD-10-CM | POA: Insufficient documentation

## 2024-09-20 DIAGNOSIS — R1011 Right upper quadrant pain: Secondary | ICD-10-CM | POA: Diagnosis not present

## 2024-09-20 LAB — COMPREHENSIVE METABOLIC PANEL WITH GFR
ALT: 12 U/L (ref 0–44)
AST: 18 U/L (ref 15–41)
Albumin: 3.6 g/dL (ref 3.5–5.0)
Alkaline Phosphatase: 160 U/L — ABNORMAL HIGH (ref 38–126)
Anion gap: 9 (ref 5–15)
BUN: 10 mg/dL (ref 8–23)
CO2: 29 mmol/L (ref 22–32)
Calcium: 8.9 mg/dL (ref 8.9–10.3)
Chloride: 100 mmol/L (ref 98–111)
Creatinine, Ser: 0.8 mg/dL (ref 0.44–1.00)
GFR, Estimated: >60 mL/min (ref >60)
Glucose, Bld: 91 mg/dL (ref 70–99)
Potassium: 4 mmol/L (ref 3.5–5.1)
Sodium: 137 mmol/L (ref 135–145)
Total Bilirubin: 0.3 mg/dL (ref 0.0–1.2)
Total Protein: 6.9 g/dL (ref 6.5–8.1)

## 2024-09-20 LAB — CBC
HCT: 35.4 % — ABNORMAL LOW (ref 36.0–46.0)
Hemoglobin: 11.7 g/dL — ABNORMAL LOW (ref 12.0–15.0)
MCH: 32.7 pg (ref 26.0–34.0)
MCHC: 33.1 g/dL (ref 30.0–36.0)
MCV: 98.9 fL (ref 80.0–100.0)
Platelets: 437 K/uL — ABNORMAL HIGH (ref 150–400)
RBC: 3.58 MIL/uL — ABNORMAL LOW (ref 3.87–5.11)
RDW: 14.6 % (ref 11.5–15.5)
WBC: 7 K/uL (ref 4.0–10.5)
nRBC: 0 % (ref 0.0–0.2)

## 2024-09-20 LAB — PHENYTOIN LEVEL, TOTAL: Phenytoin Lvl: 16 ug/mL (ref 10.0–20.0)

## 2024-09-20 MED ORDER — FUROSEMIDE 20 MG PO TABS: 20.0000 mg | ORAL_TABLET | Freq: Every day | ORAL | 0 refills | Status: AC

## 2024-09-20 MED ORDER — FUROSEMIDE 10 MG/ML IJ SOLN
40.0000 mg | INTRAMUSCULAR | Status: AC
  Administered 2024-09-20: 40 mg via INTRAVENOUS
  Filled 2024-09-20: qty 4

## 2024-09-20 NOTE — ED Triage Notes (Signed)
 Patient arrives via [facility] from home. C/C leg swelling x2 days ago. Denies taking lasix . CBG 141.

## 2024-09-20 NOTE — Discharge Instructions (Addendum)
 I have prescribed furosemide  which is a water  pill, you will need to take this once a day for the next 10 days, at that point you will need to have your family doctor recheck you to make sure that everything is going well.  Unfortunately the swelling is probably from being relatively immobile, you need to do more to get around the house and start to do some more activity to help with the swelling.  I would also recommend going to the pharmacy and getting some compression stockings to help with the swelling.  Thank you for allowing us  to treat you in the emergency department today.  After reviewing your examination and potential testing that was done it appears that you are safe to go home.  I would like for you to follow-up with your doctor within the next several days, have them obtain your records and follow-up with them to review all potential tests and results from your visit.  If you should develop severe or worsening symptoms return to the emergency department immediately

## 2024-09-20 NOTE — ED Provider Notes (Signed)
 [place] EMERGENCY DEPARTMENT AT [facility] Provider Note   CSN: [id number] Arrival date & time: 09/20/24  1011     Patient presents with: Leg Swelling   [name] is a 73 y.o. female.   HPI   This patient is a 73 year old female well-known to the emergency department for frequent visits, this patient is treated for high cholesterol with Lipitor, she is on clonazepam , Dilantin  as well as tamoxifen , she takes pain medications, antidepressants and Bentyl .  She presents with a complaint of bilateral leg swelling and tightness.  The patient has had a recent evaluation in the emergency department because of a superior pubic ramus fracture as seen on November 14, she has had multiple evaluations including an evaluation in the hospital after having the falls on November 9, she was admitted to the hospital.  She was seen for right upper quadrant abdominal pain on 7 November, COPD exacerbations in October, anxiety in October, multiple visits per month going back over the year.  Ultimately the patient presents with swelling in her legs, she cannot tell me exactly when it started, she states it is not totally normal for her and she does not know of any chronic liver disease that she has.  She does endorse smoking marijuana and she smells of that today.  She denies shortness of breath or chest pain, no coughing up of any blood.  No history of blood clots  Prior to Admission medications   Medication Sig Start Date End Date Taking? Authorizing Provider  furosemide  (LASIX ) 20 MG tablet Take 1 tablet (20 mg total) by mouth daily for 10 days. 09/20/24 09/30/24 Yes Cleotilde Rogue, MD  acetaminophen  (TYLENOL ) 500 MG tablet Take 2 tablets (1,000 mg total) by mouth every 8 (eight) hours. 09/08/24   Ricky Fines, MD  Aromatic Inhalants (VICKS VAPOINHALER IN) Inhale 1 puff into the lungs daily as needed (congestion).    [provider]  atorvastatin  (LIPITOR) 20 MG tablet Take 1 tablet  (20 mg total) by mouth every evening. 06/24/24   Bevely Doffing, FNP  budeson-glycopyrrolate -formoterol  (BREZTRI  AEROSPHERE) 160-9-4.8 MCG/ACT AERO inhaler Inhale 2 puffs into the lungs 2 (two) times daily.    [provider]  carboxymethylcellulose (REFRESH PLUS) 0.5 % SOLN Place 1 drop into both eyes 3 (three) times daily as needed (dry/irritated eyes.).    [provider]  clonazePAM  (KLONOPIN ) 0.5 MG tablet Take 1 tablet (0.5 mg total) by mouth 2 (two) times daily as needed for anxiety. 09/08/24   Ricky Fines, MD  dicyclomine  (BENTYL ) 10 MG capsule Take 10 mg by mouth 2 (two) times daily.    [provider]  DULoxetine  (CYMBALTA ) 20 MG capsule Take 20 mg by mouth daily. 02/04/24   [provider]  HYDROcodone -acetaminophen  (NORCO) 10-325 MG tablet Take 1 tablet by mouth every 6 (six) hours as needed. 09/14/24   Bevely Doffing, FNP  hydrOXYzine (ATARAX) 25 MG tablet Take 25 mg by mouth 2 (two) times daily. 09/10/22   [provider]  methocarbamol  (ROBAXIN ) 500 MG tablet Take 1 tablet (500 mg total) by mouth every 8 (eight) hours as needed for muscle spasms. 09/08/24   Ricky Fines, MD  Multiple Vitamin (MULTIVITAMIN WITH MINERALS) TABS tablet Take 1 tablet by mouth 3 (three) times a week.    [provider]  omeprazole  (PRILOSEC) 40 MG capsule Take 1 capsule (40 mg total) by mouth daily. 08/12/24   Bevely Doffing, FNP  phenytoin  (DILANTIN ) 100 MG ER capsule Take 400  mg by mouth at bedtime.    [provider]  polyethylene glycol (MIRALAX  / GLYCOLAX ) 17 g packet Take 17 g by mouth daily as needed for mild constipation. 09/08/24   Ricky Fines, MD  tamoxifen  (NOLVADEX ) 20 MG tablet TAKE ONE TABLET BY MOUTH ONCE DAILY. 11/15/22   Rogers Hai, MD  traZODone  (DESYREL ) 150 MG tablet Take 300 mg by mouth daily. 06/20/24   [provider]    Allergies: Varenicline, Codeine, Haemophilus b polysaccharide vaccine, Influenza  virus vaccine, and Lithium     Review of Systems  All other systems reviewed and are negative.   Updated Vital Signs BP 128/65   Pulse 86   Temp 99.5 F (37.5 C) (Oral)   Resp 18   Ht 1.6 m (5' 3)   Wt 51.2 kg   SpO2 96%   BMI 20.00 kg/m   Physical Exam Vitals and nursing note reviewed.  Constitutional:      General: She is not in acute distress.    Appearance: She is well-developed.  HENT:     Head: Normocephalic and atraumatic.     Mouth/Throat:     Pharynx: No oropharyngeal exudate.  Eyes:     General: No scleral icterus.       Right eye: No discharge.        Left eye: No discharge.     Conjunctiva/sclera: Conjunctivae normal.     Pupils: Pupils are equal, round, and reactive to light.  Neck:     Thyroid : No thyromegaly.     Vascular: No JVD.  Cardiovascular:     Rate and Rhythm: Normal rate and regular rhythm.     Heart sounds: Normal heart sounds. No murmur heard.    No friction rub. No gallop.  Pulmonary:     Effort: Pulmonary effort is normal. No respiratory distress.     Breath sounds: Normal breath sounds. No wheezing or rales.  Abdominal:     General: Bowel sounds are normal. There is no distension.     Palpations: Abdomen is soft. There is no mass.     Tenderness: There is abdominal tenderness.     Comments: RUQ mild ttp - no guarding, no other abd ttp  Musculoskeletal:        General: No tenderness. Normal range of motion.     Cervical back: Normal range of motion and neck supple.     Right lower leg: Edema present.     Left lower leg: Edema present.     Comments: 1-2+ pitting edema bilaterally symmetrical  Lymphadenopathy:     Cervical: No cervical adenopathy.  Skin:    General: Skin is warm and dry.     Findings: No erythema or rash.  Neurological:     General: No focal deficit present.     Mental Status: She is alert.     Coordination: Coordination normal.  Psychiatric:        Behavior: Behavior normal.     (all labs ordered are  listed, but only abnormal results are displayed) Labs Reviewed  CBC - Abnormal; Notable for the following components:      Result Value   RBC 3.58 (*)    Hemoglobin 11.7 (*)    HCT 35.4 (*)    Platelets 437 (*)    All other components within normal limits  COMPREHENSIVE METABOLIC PANEL WITH GFR - Abnormal; Notable for the following components:   Alkaline Phosphatase 160 (*)    All other components within normal limits  PHENYTOIN  LEVEL, TOTAL    EKG: None  Radiology: No results found.   Procedures   Medications Ordered in the ED  furosemide  (LASIX ) injection 40 mg (40 mg Intravenous Given 09/20/24 1137)                                    Medical Decision Making Amount and/or Complexity of Data Reviewed Labs: ordered.  Risk Prescription drug management.   The patient endorses that she has been able to get back and forth to the bedside commode without difficulty, there is no findings of chest pain shortness of breath hypoxia or tachycardia, she is not febrile and has a normal blood pressure.  She is not coughing or short of breath or having any chest pain at this time.  Will obtain labs to make sure there is no liver dysfunction, the patient is agreeable, likely need some Lasix , I suspect this is related to some immobility as she has not been very mobile since her pelvic fracture  Labs pending  Labs:  I  personally viewed and interpreted the labs which show metabolic panel CBC unremarkable, Dilantin  level therapeutic  Patient given IV Lasix   Vital stable, patient will be given 10 days of furosemide  for home, can wear compression stockings and follow-up      Final diagnoses:  Peripheral edema    ED Discharge Orders          Ordered    furosemide  (LASIX ) 20 MG tablet  Daily        09/20/24 1138               Cleotilde Rogue, MD 09/20/24 1140

## 2024-09-25 ENCOUNTER — Other Ambulatory Visit: Payer: Self-pay

## 2024-09-30 ENCOUNTER — Telehealth: Payer: Self-pay

## 2024-09-30 ENCOUNTER — Inpatient Hospital Stay: Payer: Self-pay | Admitting: Family Medicine

## 2024-09-30 NOTE — Telephone Encounter (Signed)
 Prescription Request  09/30/2024  LOV: Visit date not found  What is the name of the medication or equipment? HYDROcodone -acetaminophen  (NORCO) 10-325 MG tablet, clonazePAM  (KLONOPIN ) 0.5 MG tablet   Have you contacted your pharmacy to request a refill? Yes   Which pharmacy would you like this sent to?  [facility] - [place], [place] - [address] [place] [address] Phone: [phone] Fax: [phone]    Patient notified that their request is being sent to the clinical staff for review and that they should receive a response within 2 business days.   Please advise at Mobile [phone] (mobile)

## 2024-10-05 ENCOUNTER — Other Ambulatory Visit: Payer: Self-pay

## 2024-10-05 DIAGNOSIS — G894 Chronic pain syndrome: Secondary | ICD-10-CM

## 2024-10-05 MED ORDER — HYDROCODONE-ACETAMINOPHEN 10-325 MG PO TABS: 1.0000 | ORAL_TABLET | Freq: Four times a day (QID) | ORAL | 0 refills | Status: DC | PRN

## 2024-10-05 MED ORDER — CLONAZEPAM 0.5 MG PO TABS: 0.5000 mg | ORAL_TABLET | Freq: Three times a day (TID) | ORAL | 0 refills | Status: DC | PRN

## 2024-10-05 NOTE — Telephone Encounter (Signed)
 Refills sent to pharmacy.

## 2024-10-06 NOTE — Telephone Encounter (Signed)
 Noted, Pt advised with verbal understanding

## 2024-10-07 ENCOUNTER — Other Ambulatory Visit: Payer: Self-pay

## 2024-10-09 ENCOUNTER — Ambulatory Visit (INDEPENDENT_AMBULATORY_CARE_PROVIDER_SITE_OTHER)

## 2024-10-09 VITALS — BP 153/75 | HR 113 | Ht 63.0 in | Wt 112.0 lb

## 2024-10-09 DIAGNOSIS — J449 Chronic obstructive pulmonary disease, unspecified: Secondary | ICD-10-CM | POA: Diagnosis not present

## 2024-10-09 DIAGNOSIS — R600 Localized edema: Secondary | ICD-10-CM

## 2024-10-09 NOTE — Assessment & Plan Note (Signed)
 She endorsed using Breztri  inhaler daily as directed.  She still has refills of previous rx from Dr. Bertell.   Recommend referral to pulmonary for consultation and evaluation.

## 2024-10-09 NOTE — Progress Notes (Signed)
 Established Patient Office Visit  Subjective   Patient ID: [name], female    DOB: 1951-08-23  Age: 73 y.o. MRN: [id number]  Chief Complaint  Patient presents with   Hospitalization Follow-up    Pt here for hosptial follow up     HPI Patient was seen at [facility] on 09/20/24 for swelling in her left lower leg.   See below intake note form ER  [name] is a 73 y.o. female.    HPI   This patient is a 73 year old female well-known to the emergency department for frequent visits, this patient is treated for high cholesterol with Lipitor, she is on clonazepam , Dilantin  as well as tamoxifen , she takes pain medications, antidepressants and Bentyl .  She presents with a complaint of bilateral leg swelling and tightness.  The patient has had a recent evaluation in the emergency department because of a superior pubic ramus fracture as seen on November 14, she has had multiple evaluations including an evaluation in the hospital after having the falls on November 9, she was admitted to the hospital.  She was seen for right upper quadrant abdominal pain on 7 November, COPD exacerbations in October, anxiety in October, multiple visits per month going back over the year.  Ultimately the patient presents with swelling in her legs, she cannot tell me exactly when it started, she states it is not totally normal for her and she does not know of any chronic liver disease that she has.  She does endorse smoking marijuana and she smells of that today.  She denies shortness of breath or chest pain, no coughing up of any blood.  No history of blood clots  Will obtain labs to make sure there is no liver dysfunction, the patient is agreeable, likely need some Lasix , I suspect this is related to some immobility as she has not been very mobile since her pelvic fracture   Labs:  I  personally viewed and interpreted the labs which show metabolic panel CBC unremarkable, Dilantin  level therapeutic   Patient  given IV Lasix    Vital stable, patient will be given 10 days of furosemide  for home, can wear compression stockings and follow-up  Patient Active Problem List   Diagnosis Date Noted   Edema of left lower leg 10/09/2024   Multiple closed pelvic fractures without disruption of pelvic circle (HCC) 09/08/2024   Falls 09/06/2024   Bilateral pubic rami fractures (HCC) 09/06/2024   Chronic pain syndrome 06/24/2024   Acute respiratory failure with hypoxia (HCC) 06/12/2024   HLD (hyperlipidemia) 06/12/2024   Seizure disorder (HCC) 06/12/2024   History of CVA (cerebrovascular accident) 06/12/2024   History of breast cancer 06/12/2024   Bipolar disorder (HCC) 06/12/2024   Acute hypoxic respiratory failure (HCC) 03/10/2024   Acute exacerbation of COPD with asthma (HCC) 03/09/2024   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)    S/P right hip fracture    Acute blood loss as cause of postoperative anemia    Closed fracture of right hip [facility]) s/p compression fixation 04/25/19 04/24/2019   Leukocytosis 04/24/2019   COPD (chronic obstructive pulmonary disease) (HCC) 05/30/2017   Colitis 08/21/2016   Adrenal mass, left 08/21/2016   Solitary pulmonary nodule 08/21/2016   Tobacco use disorder 08/21/2016   GERD (gastroesophageal reflux disease) 06/13/2010   IRRITABLE BOWEL SYNDROME 06/13/2010   HEMATOCHEZIA 06/13/2010   WEIGHT LOSS, ABNORMAL 06/13/2010   ABDOMINAL PAIN, GENERALIZED 06/13/2010    ROS  Objective:     BP (!) 153/75   Pulse (!) 113   Ht 5' 3 (1.6 m)   Wt 112 lb (50.8 kg)   SpO2 94%   BMI 19.84 kg/m  BP Readings from Last 3 Encounters:  10/09/24 (!) 153/75  09/20/24 128/65  09/11/24 (!) 165/88   Wt Readings from Last 3 Encounters:  10/09/24 112 lb (50.8 kg)  09/20/24 112 lb 14 oz (51.2 kg)  09/11/24 113 lb (51.3 kg)     Physical Exam Vitals and nursing note reviewed.  Constitutional:      Appearance: Normal appearance.  HENT:      Head: Normocephalic.  Eyes:     Extraocular Movements: Extraocular movements intact.     Pupils: Pupils are equal, round, and reactive to light.  Cardiovascular:     Rate and Rhythm: Normal rate and regular rhythm.  Pulmonary:     Effort: Pulmonary effort is normal.     Breath sounds: Examination of the right-upper field reveals rhonchi. Examination of the left-upper field reveals rhonchi. Examination of the right-middle field reveals rhonchi. Examination of the left-middle field reveals rhonchi. Rhonchi present. No decreased breath sounds or wheezing.  Musculoskeletal:     Cervical back: Normal range of motion and neck supple.  Neurological:     Mental Status: She is alert and oriented to person, place, and time.  Psychiatric:        Mood and Affect: Mood normal.        Thought Content: Thought content normal.      No results found for any visits on 10/09/24.    The ASCVD Risk score (Arnett DK, et al., 2019) failed to calculate for the following reasons:   Risk score cannot be calculated because patient has a medical history suggesting prior/existing ASCVD   * - Cholesterol units were assumed    Assessment & Plan:   Problem List Items Addressed This Visit       Respiratory   COPD (chronic obstructive pulmonary disease) (HCC) - Primary   She endorsed using Breztri  inhaler daily as directed.  She still has refills of previous rx from Dr. Bertell.   Recommend referral to pulmonary for consultation and evaluation.        Relevant Orders   Ambulatory referral to Pulmonology     Other   Edema of left lower leg   Swelling has improved and she is wearing compression socks.  Continue with use of compression socks and f/u if symptoms return.        No follow-ups on file.    Leita Longs, FNP

## 2024-10-09 NOTE — Assessment & Plan Note (Signed)
 Swelling has improved and she is wearing compression socks.  Continue with use of compression socks and f/u if symptoms return.

## 2024-10-23 ENCOUNTER — Other Ambulatory Visit: Payer: Self-pay

## 2024-10-23 DIAGNOSIS — G894 Chronic pain syndrome: Secondary | ICD-10-CM

## 2024-11-02 ENCOUNTER — Other Ambulatory Visit: Payer: Self-pay

## 2024-11-10 ENCOUNTER — Other Ambulatory Visit: Payer: Self-pay

## 2024-11-10 DIAGNOSIS — G894 Chronic pain syndrome: Secondary | ICD-10-CM

## 2024-11-27 ENCOUNTER — Other Ambulatory Visit: Payer: Self-pay

## 2024-11-30 ENCOUNTER — Other Ambulatory Visit: Payer: Self-pay

## 2024-11-30 DIAGNOSIS — G894 Chronic pain syndrome: Secondary | ICD-10-CM

## 2024-12-24 ENCOUNTER — Ambulatory Visit
